# Patient Record
Sex: Female | Born: 1940
Health system: Southern US, Community
[De-identification: ages and names within clinical notes are randomized; demographics above are authoritative.]

## PROBLEM LIST (undated history)

## (undated) DIAGNOSIS — Z950 Presence of cardiac pacemaker: Secondary | ICD-10-CM

## (undated) DIAGNOSIS — G4733 Obstructive sleep apnea (adult) (pediatric): Secondary | ICD-10-CM

## (undated) DIAGNOSIS — M47812 Spondylosis without myelopathy or radiculopathy, cervical region: Secondary | ICD-10-CM

## (undated) DIAGNOSIS — G4710 Hypersomnia, unspecified: Secondary | ICD-10-CM

## (undated) DIAGNOSIS — N318 Other neuromuscular dysfunction of bladder: Secondary | ICD-10-CM

## (undated) DIAGNOSIS — E039 Hypothyroidism, unspecified: Secondary | ICD-10-CM

## (undated) DIAGNOSIS — F329 Major depressive disorder, single episode, unspecified: Secondary | ICD-10-CM

## (undated) DIAGNOSIS — E7800 Pure hypercholesterolemia, unspecified: Secondary | ICD-10-CM

## (undated) DIAGNOSIS — M1990 Unspecified osteoarthritis, unspecified site: Secondary | ICD-10-CM

## (undated) DIAGNOSIS — I10 Essential (primary) hypertension: Secondary | ICD-10-CM

## (undated) DIAGNOSIS — F32A Depression, unspecified: Secondary | ICD-10-CM

## (undated) DIAGNOSIS — R51 Headache: Secondary | ICD-10-CM

## (undated) DIAGNOSIS — M519 Unspecified thoracic, thoracolumbar and lumbosacral intervertebral disc disorder: Secondary | ICD-10-CM

## (undated) DIAGNOSIS — I495 Sick sinus syndrome: Secondary | ICD-10-CM

## (undated) DIAGNOSIS — K648 Other hemorrhoids: Secondary | ICD-10-CM

## (undated) DIAGNOSIS — K219 Gastro-esophageal reflux disease without esophagitis: Secondary | ICD-10-CM

## (undated) HISTORY — DX: Headache: R51

## (undated) HISTORY — PX: BIOPSY BREAST: PRO8

## (undated) HISTORY — PX: CATARACT EXTRACTION W/ INTRAOCULAR LENS IMPLANT: SHX1309

## (undated) HISTORY — DX: Pure hypercholesterolemia, unspecified: E78.00

## (undated) HISTORY — DX: Unspecified osteoarthritis, unspecified site: M19.90

## (undated) HISTORY — PX: TUBAL LIGATION: SHX77

## (undated) HISTORY — DX: Hypersomnia, unspecified: G47.10

## (undated) HISTORY — DX: Other neuromuscular dysfunction of bladder: N31.8

## (undated) HISTORY — DX: Major depressive disorder, single episode, unspecified: F32.9

## (undated) HISTORY — DX: Depression, unspecified: F32.A

## (undated) HISTORY — DX: Other hemorrhoids: K64.8

## (undated) HISTORY — PX: COLONOSCOPY: SHX174

## (undated) HISTORY — DX: Essential (primary) hypertension: I10

## (undated) HISTORY — DX: Obstructive sleep apnea (adult) (pediatric): G47.33

## (undated) HISTORY — DX: Hypothyroidism, unspecified: E03.9

## (undated) HISTORY — DX: Unspecified thoracic, thoracolumbar and lumbosacral intervertebral disc disorder: M51.9

## (undated) HISTORY — DX: Spondylosis without myelopathy or radiculopathy, cervical region: M47.812

## (undated) HISTORY — PX: KNEE ARTHROSCOPY: SUR90

## (undated) HISTORY — PX: OTHER SURGICAL HISTORY: SHX169

---

## 1998-06-15 ENCOUNTER — Other Ambulatory Visit: Admission: RE | Admit: 1998-06-15 | Discharge: 1998-06-15 | Payer: Self-pay | Admitting: Obstetrics and Gynecology

## 1998-12-30 ENCOUNTER — Other Ambulatory Visit: Admission: RE | Admit: 1998-12-30 | Discharge: 1998-12-30 | Payer: Self-pay | Admitting: *Deleted

## 1999-02-08 ENCOUNTER — Other Ambulatory Visit: Admission: RE | Admit: 1999-02-08 | Discharge: 1999-02-08 | Payer: Self-pay | Admitting: Obstetrics and Gynecology

## 2001-02-27 ENCOUNTER — Other Ambulatory Visit: Admission: RE | Admit: 2001-02-27 | Discharge: 2001-02-27 | Payer: Self-pay | Admitting: Obstetrics and Gynecology

## 2001-07-04 ENCOUNTER — Ambulatory Visit (HOSPITAL_COMMUNITY): Admission: RE | Admit: 2001-07-04 | Discharge: 2001-07-04 | Payer: Self-pay | Admitting: Gastroenterology

## 2004-11-08 ENCOUNTER — Ambulatory Visit: Payer: Self-pay | Admitting: Pulmonary Disease

## 2004-12-27 ENCOUNTER — Ambulatory Visit: Payer: Self-pay | Admitting: Pulmonary Disease

## 2005-02-24 ENCOUNTER — Ambulatory Visit: Payer: Self-pay | Admitting: Pulmonary Disease

## 2005-04-01 ENCOUNTER — Ambulatory Visit: Payer: Self-pay | Admitting: Pulmonary Disease

## 2005-11-23 ENCOUNTER — Ambulatory Visit: Payer: Self-pay | Admitting: Pulmonary Disease

## 2005-12-28 ENCOUNTER — Ambulatory Visit: Payer: Self-pay | Admitting: Pulmonary Disease

## 2006-01-23 ENCOUNTER — Encounter: Admission: RE | Admit: 2006-01-23 | Discharge: 2006-01-23 | Payer: Self-pay | Admitting: Obstetrics and Gynecology

## 2006-02-28 ENCOUNTER — Ambulatory Visit: Payer: Self-pay | Admitting: Pulmonary Disease

## 2006-08-04 ENCOUNTER — Ambulatory Visit: Payer: Self-pay | Admitting: Pulmonary Disease

## 2007-01-09 ENCOUNTER — Encounter: Payer: Self-pay | Admitting: Internal Medicine

## 2007-01-29 ENCOUNTER — Encounter: Admission: RE | Admit: 2007-01-29 | Discharge: 2007-01-29 | Payer: Self-pay | Admitting: Obstetrics and Gynecology

## 2007-02-02 DIAGNOSIS — E039 Hypothyroidism, unspecified: Secondary | ICD-10-CM

## 2007-02-02 DIAGNOSIS — M1990 Unspecified osteoarthritis, unspecified site: Secondary | ICD-10-CM | POA: Insufficient documentation

## 2007-02-02 DIAGNOSIS — I10 Essential (primary) hypertension: Secondary | ICD-10-CM

## 2007-02-05 ENCOUNTER — Encounter: Payer: Self-pay | Admitting: Adult Health

## 2007-02-05 ENCOUNTER — Ambulatory Visit: Payer: Self-pay | Admitting: Pulmonary Disease

## 2007-02-05 DIAGNOSIS — E785 Hyperlipidemia, unspecified: Secondary | ICD-10-CM

## 2007-02-05 DIAGNOSIS — F418 Other specified anxiety disorders: Secondary | ICD-10-CM

## 2007-02-05 DIAGNOSIS — J309 Allergic rhinitis, unspecified: Secondary | ICD-10-CM | POA: Insufficient documentation

## 2007-02-05 DIAGNOSIS — R51 Headache: Secondary | ICD-10-CM

## 2007-02-05 DIAGNOSIS — M47812 Spondylosis without myelopathy or radiculopathy, cervical region: Secondary | ICD-10-CM

## 2007-02-05 DIAGNOSIS — R519 Headache, unspecified: Secondary | ICD-10-CM | POA: Insufficient documentation

## 2007-02-05 LAB — CONVERTED CEMR LAB
AST: 22 units/L (ref 0–37)
BUN: 20 mg/dL (ref 6–23)
Basophils Relative: 0.5 % (ref 0.0–1.0)
Bilirubin, Direct: 0.1 mg/dL (ref 0.0–0.3)
Cholesterol: 189 mg/dL (ref 0–200)
Eosinophils Relative: 3.1 % (ref 0.0–5.0)
GFR calc non Af Amer: 90 mL/min
Glucose, Bld: 89 mg/dL (ref 70–99)
HCT: 41.2 % (ref 36.0–46.0)
MCHC: 32.4 g/dL (ref 30.0–36.0)
MCV: 90.4 fL (ref 78.0–100.0)
Monocytes Absolute: 0.4 10*3/uL (ref 0.2–0.7)
Monocytes Relative: 7.4 % (ref 3.0–11.0)
Neutrophils Relative %: 52.1 % (ref 43.0–77.0)
Platelets: 282 10*3/uL (ref 150–400)
RBC: 4.56 M/uL (ref 3.87–5.11)
RDW: 12.5 % (ref 11.5–14.6)
TSH: 1.47 microintl units/mL (ref 0.35–5.50)
Triglycerides: 60 mg/dL (ref 0–149)
WBC: 5.4 10*3/uL (ref 4.5–10.5)

## 2007-02-14 ENCOUNTER — Telehealth: Payer: Self-pay | Admitting: Pulmonary Disease

## 2007-03-06 ENCOUNTER — Telehealth: Payer: Self-pay | Admitting: Pulmonary Disease

## 2007-03-13 ENCOUNTER — Telehealth (INDEPENDENT_AMBULATORY_CARE_PROVIDER_SITE_OTHER): Payer: Self-pay | Admitting: *Deleted

## 2007-04-02 ENCOUNTER — Telehealth: Payer: Self-pay | Admitting: Pulmonary Disease

## 2007-04-09 ENCOUNTER — Telehealth (INDEPENDENT_AMBULATORY_CARE_PROVIDER_SITE_OTHER): Payer: Self-pay | Admitting: *Deleted

## 2007-04-11 ENCOUNTER — Ambulatory Visit: Payer: Self-pay | Admitting: Pulmonary Disease

## 2007-04-29 ENCOUNTER — Ambulatory Visit (HOSPITAL_BASED_OUTPATIENT_CLINIC_OR_DEPARTMENT_OTHER): Admission: RE | Admit: 2007-04-29 | Discharge: 2007-04-29 | Payer: Self-pay | Admitting: Pulmonary Disease

## 2007-04-29 ENCOUNTER — Encounter: Payer: Self-pay | Admitting: Internal Medicine

## 2007-05-05 ENCOUNTER — Ambulatory Visit: Payer: Self-pay | Admitting: Internal Medicine

## 2007-05-18 ENCOUNTER — Ambulatory Visit: Payer: Self-pay | Admitting: Internal Medicine

## 2007-05-23 ENCOUNTER — Encounter: Payer: Self-pay | Admitting: Internal Medicine

## 2007-05-31 ENCOUNTER — Encounter: Payer: Self-pay | Admitting: Internal Medicine

## 2007-05-31 DIAGNOSIS — G4733 Obstructive sleep apnea (adult) (pediatric): Secondary | ICD-10-CM

## 2007-06-06 ENCOUNTER — Encounter: Payer: Self-pay | Admitting: Internal Medicine

## 2007-06-14 ENCOUNTER — Telehealth (INDEPENDENT_AMBULATORY_CARE_PROVIDER_SITE_OTHER): Payer: Self-pay | Admitting: *Deleted

## 2007-06-15 ENCOUNTER — Ambulatory Visit: Payer: Self-pay | Admitting: Internal Medicine

## 2007-06-15 DIAGNOSIS — G4710 Hypersomnia, unspecified: Secondary | ICD-10-CM | POA: Insufficient documentation

## 2007-06-18 ENCOUNTER — Encounter: Payer: Self-pay | Admitting: Internal Medicine

## 2007-06-27 ENCOUNTER — Encounter: Payer: Self-pay | Admitting: Internal Medicine

## 2007-06-29 ENCOUNTER — Encounter: Payer: Self-pay | Admitting: Pulmonary Disease

## 2007-07-11 ENCOUNTER — Encounter: Payer: Self-pay | Admitting: Internal Medicine

## 2007-07-23 ENCOUNTER — Telehealth (INDEPENDENT_AMBULATORY_CARE_PROVIDER_SITE_OTHER): Payer: Self-pay | Admitting: *Deleted

## 2007-08-03 ENCOUNTER — Ambulatory Visit: Payer: Self-pay | Admitting: Internal Medicine

## 2007-08-06 ENCOUNTER — Ambulatory Visit: Payer: Self-pay | Admitting: Pulmonary Disease

## 2007-08-10 ENCOUNTER — Telehealth (INDEPENDENT_AMBULATORY_CARE_PROVIDER_SITE_OTHER): Payer: Self-pay | Admitting: *Deleted

## 2007-08-20 LAB — CONVERTED CEMR LAB
Basophils Absolute: 0.1 10*3/uL (ref 0.0–0.1)
Eosinophils Relative: 3.1 % (ref 0.0–5.0)
HCT: 37.2 % (ref 36.0–46.0)
Hemoglobin: 12.6 g/dL (ref 12.0–15.0)
Lymphocytes Relative: 43.9 % (ref 12.0–46.0)
Monocytes Absolute: 0.5 10*3/uL (ref 0.1–1.0)
Monocytes Relative: 7.7 % (ref 3.0–12.0)
Platelets: 296 10*3/uL (ref 150–400)
RDW: 12.2 % (ref 11.5–14.6)
Vitamin B-12: 879 pg/mL (ref 211–911)

## 2008-01-07 ENCOUNTER — Telehealth (INDEPENDENT_AMBULATORY_CARE_PROVIDER_SITE_OTHER): Payer: Self-pay | Admitting: *Deleted

## 2008-01-30 ENCOUNTER — Encounter: Admission: RE | Admit: 2008-01-30 | Discharge: 2008-01-30 | Payer: Self-pay | Admitting: Obstetrics and Gynecology

## 2008-02-25 ENCOUNTER — Ambulatory Visit: Payer: Self-pay | Admitting: Internal Medicine

## 2008-02-25 ENCOUNTER — Encounter: Payer: Self-pay | Admitting: Adult Health

## 2008-02-25 LAB — CONVERTED CEMR LAB
AST: 30 units/L (ref 0–37)
Basophils Relative: 0.8 % (ref 0.0–3.0)
Bilirubin, Direct: 0.2 mg/dL (ref 0.0–0.3)
CO2: 30 meq/L (ref 19–32)
CRP, High Sensitivity: 2 (ref 0.00–5.00)
Calcium: 9.5 mg/dL (ref 8.4–10.5)
Chloride: 104 meq/L (ref 96–112)
Creatinine, Ser: 0.6 mg/dL (ref 0.4–1.2)
Eosinophils Absolute: 0.2 10*3/uL (ref 0.0–0.7)
GFR calc Af Amer: 129 mL/min
GFR calc non Af Amer: 107 mL/min
Glucose, Bld: 95 mg/dL (ref 70–99)
HCT: 38.7 % (ref 36.0–46.0)
Hemoglobin: 13.3 g/dL (ref 12.0–15.0)
Ketones, ur: NEGATIVE mg/dL
Lymphocytes Relative: 30.2 % (ref 12.0–46.0)
Neutrophils Relative %: 59.2 % (ref 43.0–77.0)
Platelets: 269 10*3/uL (ref 150–400)
Specific Gravity, Urine: 1.015 (ref 1.000–1.03)
Total Bilirubin: 0.8 mg/dL (ref 0.3–1.2)
Total CHOL/HDL Ratio: 4.4
Total Protein, Urine: NEGATIVE mg/dL
Total Protein: 7.7 g/dL (ref 6.0–8.3)
Triglycerides: 57 mg/dL (ref 0–149)
Urobilinogen, UA: 0.2 (ref 0.0–1.0)
VLDL: 11 mg/dL (ref 0–40)
pH: 6 (ref 5.0–8.0)

## 2008-02-27 LAB — CONVERTED CEMR LAB
AST: 30 units/L (ref 0–37)
Albumin: 4.4 g/dL (ref 3.5–5.2)
Alkaline Phosphatase: 59 units/L (ref 39–117)
Bilirubin Urine: NEGATIVE
Bilirubin, Direct: 0.2 mg/dL (ref 0.0–0.3)
Chloride: 104 meq/L (ref 96–112)
Eosinophils Relative: 3.3 % (ref 0.0–5.0)
GFR calc Af Amer: 129 mL/min
GFR calc non Af Amer: 107 mL/min
HDL: 39.2 mg/dL (ref >39.0)
Hemoglobin: 13.3 g/dL (ref 12.0–15.0)
MCV: 88.4 fL (ref 78.0–100.0)
Monocytes Absolute: 0.4 10*3/uL (ref 0.1–1.0)
Monocytes Relative: 6.5 % (ref 3.0–12.0)
Neutrophils Relative %: 59.2 % (ref 43.0–77.0)
Potassium: 3.7 meq/L (ref 3.5–5.1)
Sodium: 142 meq/L (ref 135–145)
Specific Gravity, Urine: 1.015 (ref 1.000–1.03)
TSH: 1.48 microintl units/mL (ref 0.35–5.50)
Total Bilirubin: 0.8 mg/dL (ref 0.3–1.2)
Total CHOL/HDL Ratio: 4.4
Triglycerides: 57 mg/dL (ref 0–149)
Urobilinogen, UA: 0.2 (ref 0.0–1.0)
VLDL: 11 mg/dL (ref 0–40)
WBC: 5.4 10*3/uL (ref 4.5–10.5)

## 2008-03-05 ENCOUNTER — Telehealth (INDEPENDENT_AMBULATORY_CARE_PROVIDER_SITE_OTHER): Payer: Self-pay | Admitting: *Deleted

## 2008-03-10 ENCOUNTER — Encounter: Payer: Self-pay | Admitting: Internal Medicine

## 2008-04-02 ENCOUNTER — Encounter: Payer: Self-pay | Admitting: Internal Medicine

## 2008-04-18 ENCOUNTER — Encounter: Payer: Self-pay | Admitting: Adult Health

## 2008-06-27 ENCOUNTER — Telehealth: Payer: Self-pay | Admitting: Pulmonary Disease

## 2008-06-27 ENCOUNTER — Encounter: Payer: Self-pay | Admitting: Pulmonary Disease

## 2008-06-27 ENCOUNTER — Encounter: Admission: RE | Admit: 2008-06-27 | Discharge: 2008-06-27 | Payer: Self-pay | Admitting: Pulmonary Disease

## 2008-11-10 ENCOUNTER — Ambulatory Visit: Payer: Self-pay | Admitting: Pulmonary Disease

## 2008-11-10 ENCOUNTER — Telehealth: Payer: Self-pay | Admitting: Pulmonary Disease

## 2008-11-10 DIAGNOSIS — K648 Other hemorrhoids: Secondary | ICD-10-CM

## 2008-11-10 HISTORY — DX: Other hemorrhoids: K64.8

## 2008-11-10 LAB — CONVERTED CEMR LAB
Bilirubin Urine: NEGATIVE
Hemoglobin, Urine: NEGATIVE
Leukocytes, UA: NEGATIVE
Total Protein, Urine: NEGATIVE mg/dL
Urine Glucose: NEGATIVE mg/dL
Urobilinogen, UA: 0.2 (ref 0.0–1.0)
pH: 6.5 (ref 5.0–8.0)

## 2008-11-11 ENCOUNTER — Encounter: Payer: Self-pay | Admitting: Pulmonary Disease

## 2008-11-13 ENCOUNTER — Encounter: Payer: Self-pay | Admitting: Pulmonary Disease

## 2008-11-22 ENCOUNTER — Encounter: Admission: RE | Admit: 2008-11-22 | Discharge: 2008-11-22 | Payer: Self-pay | Admitting: Orthopedic Surgery

## 2008-12-17 ENCOUNTER — Ambulatory Visit: Payer: Self-pay | Admitting: Pulmonary Disease

## 2008-12-17 ENCOUNTER — Telehealth: Payer: Self-pay | Admitting: Pulmonary Disease

## 2008-12-17 DIAGNOSIS — K649 Unspecified hemorrhoids: Secondary | ICD-10-CM | POA: Insufficient documentation

## 2008-12-17 DIAGNOSIS — N318 Other neuromuscular dysfunction of bladder: Secondary | ICD-10-CM

## 2009-01-10 HISTORY — PX: BREAST EXCISIONAL BIOPSY: SUR124

## 2009-02-13 ENCOUNTER — Telehealth: Payer: Self-pay | Admitting: Pulmonary Disease

## 2009-02-24 ENCOUNTER — Encounter: Admission: RE | Admit: 2009-02-24 | Discharge: 2009-02-24 | Payer: Self-pay | Admitting: Obstetrics and Gynecology

## 2009-03-16 ENCOUNTER — Telehealth: Payer: Self-pay | Admitting: Pulmonary Disease

## 2009-03-18 ENCOUNTER — Encounter: Payer: Self-pay | Admitting: Pulmonary Disease

## 2009-03-20 ENCOUNTER — Telehealth (INDEPENDENT_AMBULATORY_CARE_PROVIDER_SITE_OTHER): Payer: Self-pay | Admitting: *Deleted

## 2009-04-02 ENCOUNTER — Ambulatory Visit (HOSPITAL_BASED_OUTPATIENT_CLINIC_OR_DEPARTMENT_OTHER): Admission: RE | Admit: 2009-04-02 | Discharge: 2009-04-02 | Payer: Self-pay | Admitting: Surgery

## 2009-04-02 ENCOUNTER — Encounter: Admission: RE | Admit: 2009-04-02 | Discharge: 2009-04-02 | Payer: Self-pay | Admitting: Surgery

## 2009-04-22 ENCOUNTER — Encounter: Payer: Self-pay | Admitting: Pulmonary Disease

## 2009-07-28 ENCOUNTER — Encounter: Payer: Self-pay | Admitting: Adult Health

## 2009-07-28 ENCOUNTER — Ambulatory Visit: Payer: Self-pay | Admitting: Pulmonary Disease

## 2009-07-28 LAB — CONVERTED CEMR LAB: Rhuematoid fact SerPl-aCnc: <20 intl units/mL (ref 0–20)

## 2009-07-31 LAB — CONVERTED CEMR LAB
ALT: 17 units/L (ref 0–35)
Albumin: 4.8 g/dL (ref 3.5–5.2)
Alkaline Phosphatase: 47 units/L (ref 39–117)
Basophils Absolute: 0 10*3/uL (ref 0.0–0.1)
Basophils Relative: 0.7 % (ref 0.0–3.0)
Bilirubin Urine: NEGATIVE
Bilirubin, Direct: 0.1 mg/dL (ref 0.0–0.3)
CO2: 32 meq/L (ref 19–32)
Chloride: 105 meq/L (ref 96–112)
Eosinophils Absolute: 0.1 10*3/uL (ref 0.0–0.7)
Eosinophils Relative: 2.2 % (ref 0.0–5.0)
Glucose, Bld: 80 mg/dL (ref 70–99)
Hemoglobin, Urine: NEGATIVE
Hemoglobin: 13.5 g/dL (ref 12.0–15.0)
MCHC: 34.3 g/dL (ref 30.0–36.0)
Monocytes Absolute: 0.3 10*3/uL (ref 0.1–1.0)
Monocytes Relative: 7 % (ref 3.0–12.0)
Neutrophils Relative %: 60.2 % (ref 43.0–77.0)
Nitrite: NEGATIVE
RBC: 4.4 M/uL (ref 3.87–5.11)
RDW: 13.3 % (ref 11.5–14.6)
Sodium: 141 meq/L (ref 135–145)
Specific Gravity, Urine: <=1.005 (ref 1.000–1.030)
Total Protein: 7.7 g/dL (ref 6.0–8.3)
Triglycerides: 119 mg/dL (ref 0.0–149.0)
Urobilinogen, UA: 0.2 (ref 0.0–1.0)
pH: 6.5 (ref 5.0–8.0)

## 2009-10-02 ENCOUNTER — Telehealth (INDEPENDENT_AMBULATORY_CARE_PROVIDER_SITE_OTHER): Payer: Self-pay | Admitting: *Deleted

## 2009-10-09 ENCOUNTER — Encounter: Admission: RE | Admit: 2009-10-09 | Discharge: 2009-10-09 | Payer: Self-pay | Admitting: Orthopedic Surgery

## 2009-12-31 ENCOUNTER — Ambulatory Visit: Payer: Self-pay | Admitting: Pulmonary Disease

## 2009-12-31 DIAGNOSIS — R5381 Other malaise: Secondary | ICD-10-CM | POA: Insufficient documentation

## 2009-12-31 DIAGNOSIS — R5383 Other fatigue: Secondary | ICD-10-CM

## 2010-01-10 LAB — CONVERTED CEMR LAB: Vitamin B-12: 791 pg/mL (ref 211–911)

## 2010-01-30 ENCOUNTER — Encounter: Payer: Self-pay | Admitting: Orthopedic Surgery

## 2010-01-31 ENCOUNTER — Encounter: Payer: Self-pay | Admitting: Obstetrics and Gynecology

## 2010-02-09 NOTE — Letter (Signed)
Summary: [facility]   Imported By: Sherian Rein 04/08/2009 13:34:29  _____________________________________________________________________  External Attachment:    Type:   Image     Comment:   External Document

## 2010-02-09 NOTE — Letter (Signed)
Summary: [facility]   Imported By: Sherian Rein 05/05/2009 08:54:59  _____________________________________________________________________  External Attachment:    Type:   Image     Comment:   External Document

## 2010-02-09 NOTE — Progress Notes (Signed)
Summary: cough med called in  Phone Note Call from Patient Call back at Work Phone [phone]   Caller: Patient Call For: NADEL Summary of Call: needs  something for cough. donmt want to come in for an apt.  Initial call taken by: Valinda Hoar,  March 20, 2009 12:54 PM  Follow-up for Phone Call        cough started yesterday----unable to get anything up---but it is worse at night---giving her a very bad headache---has tried robitussion and is taking mucinex without any releif.  please adivse--  allergies---sulfa Randell Loop CMA  March 20, 2009 1:32 PM   Additional Follow-up for Phone Call Additional follow up Details #1::        per SN---ok for pt to have tussionex  #4oz  1 tsp by mouth every 12 hours as needed with 2 refills.  this has been called to the pharmacy and the pt is aware Randell Loop CMA  March 20, 2009 2:00 PM     New/Updated Medications: Sandria Senter ER 8-10 MG/5ML LQCR (CHLORPHENIRAMINE-HYDROCODONE) 1 tsp by mouth every 12 hours as needed for cough Prescriptions: TUSSIONEX PENNKINETIC ER 8-10 MG/5ML LQCR (CHLORPHENIRAMINE-HYDROCODONE) 1 tsp by mouth every 12 hours as needed for cough  #4 oz x 2   Entered by:   Randell Loop CMA   Authorized by:   Michele Mcalpine MD   Signed by:   Randell Loop CMA on 03/20/2009   Method used:   Telephoned to ...       [id number] (retail)       [address]       [place], [place]  [address]       Ph: [phone] or [phone]       Fax: [phone]   RxID:   [id number]

## 2010-02-09 NOTE — Progress Notes (Signed)
Summary: rx request  Phone Note Call from Patient Call back at Work Phone [phone]   Caller: Patient Call For: nadel Summary of Call: pt requests new rx (mail to pt) for DIOVAN. pt will send it to mail order pharm.  Initial call taken by: Tivis Ringer, CNA,  March 16, 2009 12:57 PM  Follow-up for Phone Call        The patient is requesting RX for her Diovan. She needs this mailed to her so she may send it to [facility]. I have verified her home address. She needs a 90 day supply. Follow-up by: Michel Bickers CMA,  March 16, 2009 2:18 PM  Additional Follow-up for Phone Call Additional follow up Details #1::        rx has been signed by SN and placed in the mail to the pt per her request. Randell Loop CMA  March 16, 2009 2:41 PM     Prescriptions: DIOVAN 160 MG  TABS (VALSARTAN) Take 1 tablet by mouth once a day  #90 x 3   Entered by:   Michel Bickers CMA   Authorized by:   Michele Mcalpine MD   Signed by:   Michel Bickers CMA on 03/16/2009   Method used:   Print then Mail to Patient   RxID:   [id number]

## 2010-02-09 NOTE — Progress Notes (Signed)
Summary: waiting on refill > amlodipine sent to pharmacy  Phone Note Call from Patient   Caller: Patient Call For: tammy parrett Summary of Call: [facility] [address] that they have req refill for amlodipine and are waiting for a response. note: i made a f/u appt w/ sn for 12/31/09. (pt didn't realized she needed this since she just had a cpx w/ tp in july. pt # [id number] Initial call taken by: Tivis Ringer, CNA,  October 02, 2009 8:58 AM  Follow-up for Phone Call        this is my fault, i did not click "save refills to chart."  called spoke with patient and apologized.  refills sent to pt's verified pharmacy.  pt is aware. Follow-up by: Boone Master CNA/MA,  October 02, 2009 9:29 AM    Prescriptions: AMLODIPINE BESYLATE 10 MG  TABS (AMLODIPINE BESYLATE) take 1 tab by mouth once daily...  #30 x 2   Entered by:   Boone Master CNA/MA   Authorized by:   Rubye Oaks NP   Signed by:   Boone Master CNA/MA on 10/02/2009   Method used:   Electronically to        [facility] [id number] (retail)       [address]       [place], [place]  [address]       Ph: [phone]       Fax: [phone]   RxID:   [id number]

## 2010-02-09 NOTE — Assessment & Plan Note (Signed)
Summary: cpx/ mbw   Copy to:  [name] Primary Provider/Referring Provider:  [name]  CC:  cpx--fasting today--mom is on hospice and she is dealing with this--has some issues to discuss with TP today.  History of Present Illness: 70 y/o WF with known hx of HTN, OSA, AR.     ~  December 17, 2008:  she returns due to new bladder symptoms w/ "gotta go" urges and freq small amts w/ "pressure" and "a funny feeling"; but she denies burning or pain... UA & C&S last month were normal... GYN is DrDickstein w/ PAP Qoyr now & no prev bladder prob identified... She has had a busy yr- stress continues w/ 4 y/o mother in [place] now, on Hospice for FTT; and husb had MI 7/10 w/ more stents placed... she just had colon DrMedoff- neg x hems... SleepStudy last yr w/ OSA & hypersomnia- on CPAP 9 and Nuvigil per DrYoung...  July 28, 2009 --Presents for routine follow up/physical. Has been having more difficulties in left hip/buttock. Now w/ aching into upper leg and knee pain that is limiting her activity and ability to exercise. She is seeing Dr. Despina Hick in couple of weeks. Under alot of stress. Mother is in hospice /FTT. Brother is in prison. She had a benign breast bx last month. Says she had  EKG for preop , declines today.  Denies chest pain, dyspnea, orthopnea, hemoptysis, fever, n/v/d, edema, headache,recent travel or antibiotics.  Does not wear CPAP, complains that she does not sleep well we discussed importance of follow up w/ Dr. Maple Hudson and dangers of OSA. She has agreed to see him back in office.   Preventive Screening-Counseling & Management  Alcohol-Tobacco     Smoking Status: quit     Year Quit: 1970     Pack years: 23yrs, 1/2 ppd     Passive Smoke Exposure: no  Caffeine-Diet-Exercise     Does Patient Exercise: yes     Times/week: 3  Allergies: 1)  ! Sulfa 2)  ! Altace  Comments:  Nurse/Medical Assistant: The patient's medications and allergies were reviewed with the patient and were  updated in the Medication and Allergy Lists.  Past History:  Past Medical History: Last updated: 12/17/2008 HYPERSOMNIA (ICD-780.54) OBSTRUCTIVE SLEEP APNEA (ICD-327.23) ALLERGIC RHINITIS (ICD-477.9) HYPERTENSION (ICD-401.9) HYPERCHOLESTEROLEMIA (ICD-272.0) HYPOTHYROIDISM (ICD-244.9) HEMORRHOIDS (ICD-455.6) DEGENERATIVE JOINT DISEASE (ICD-715.90) SPONDYLOSIS, CERVICAL (ICD-721.0) Hx of HEADACHE, MIXED (ICD-784.0) Hx of DEPRESSION, SITUATIONAL (ICD-309.0)  Past Surgical History: Last updated: 12/17/2008 S/P R Rotator Cuff Surgery by DrSypher S/P R Knee Arthroscopy by DrAlusio tubal ligation  Review of Systems      See HPI  Vital Signs:  Patient profile:   70 year old female Height:      60 inches Weight:      154.25 pounds O2 Sat:      96 % on Room air Temp:     97.3 degrees F oral Pulse rate:   51 / minute BP sitting:   108 / 70  (left arm) Cuff size:   regular  Vitals Entered By: Randell Loop CMA (July 28, 2009 9:29 AM)  O2 Sat at Rest %:  96 O2 Flow:  Room air CC: cpx--fasting today--mom is on hospice and she is dealing with this--has some issues to discuss with TP today Is Patient Diabetic? No Pain Assessment Patient in pain? no      Comments meds and allergies reviewed with pt today daytime phonenumber is [phone] Randell Loop CMA  July 28, 2009 9:46 AM  Physical Exam  Additional Exam:  WD, Overweight, 70 y/o WF in [place]... GENERAL:  Alert & oriented; pleasant & cooperative... HEENT:  [place]/AT, EOM-wnl, PERRLA, EACs-clear, TMs-wnl, NOSE-clear, THROAT-clear & wnl. NECK:  Supple w/ fairROM; no JVD; normal carotid impulses w/o bruits; no thyromegaly or nodules palpated; no lymphadenopathy. CHEST:  Clear to P & A; without wheezes/ rales/ or rhonchi... no trigger points. HEART:  Regular Rhythm; without murmurs/ rubs/ or gallops. ABDOMEN:  Soft & nontender; normal bowel sounds; no organomegaly or masses detected. EXT: without deformities, mild arthritic  changes; no varicose veins/ venous insuffic/ or edema. NEURO:  CN's intact; motor testing normal; sensory testing normal; gait normal & balance OK. DERM:  No lesions noted; no rash etc...    Impression & Recommendations:  Problem # 1:  OBSTRUCTIVE SLEEP APNEA (ICD-327.23) follow up Dr. Maple Hudson in next few weeks to discuss sleep apnea, hyypersomnia.   Problem # 2:  HYPERTENSION (ICD-401.9) controlled on rx  labs pending.  Her updated medication list for this problem includes:    Diovan 160 Mg Tabs (Valsartan) .[name]... Take 1 tablet by mouth once a day    Amlodipine Besylate 10 Mg Tabs (Amlodipine besylate) .[name]... Take 1 tab by mouth once daily...  BP today: 108/70 Prior BP: 122/74 (12/17/2008)  Labs Reviewed: K+: 3.7 (02/25/2008) Creat: : 0.6 (02/25/2008)   Chol: 173 (02/25/2008)   HDL: 39.2 (02/25/2008)   LDL: 122 (02/25/2008)   TG: 57 (02/25/2008)  Problem # 3:  PHYSICAL EXAMINATION (ICD-V70.0) Td, Pnumovax, shingles utd colon 2010 mammo/pap per dr Rosalio Macadamia.  cont on calcium and vit d  labs pending.  Orders: TLB-BMP (Basic Metabolic Panel-BMET) (80048-METABOL) TLB-Hepatic/Liver Function Pnl (80076-HEPATIC) TLB-CBC Platelet - w/Differential (85025-CBCD) TLB-TSH (Thyroid Stimulating Hormone) (84443-TSH) TLB-Lipid Panel (80061-LIPID) TLB-Sedimentation Rate (ESR) (85652-ESR) T-Vitamin D (25-Hydroxy) (63335-45625) TLB-Udip ONLY (81003-UDIP) Est. Patient 65& > (63893)  Problem # 4:  HYPERCHOLESTEROLEMIA (ICD-272.0) diet and exercise   Problem # 5:  DEGENERATIVE JOINT DISEASE (ICD-715.90) follow up Dr. Berton Lan  Her pdated medication list for this problem includes:    Adult Aspirin Ec Low Strength 81 Mg Tbec (Aspirin) .[name]... 1 by mouth daily  Medications Added to Medication List This Visit: 1)  Zoloft 50 Mg Tabs (Sertraline hcl) .... Take 1 tablet by mouth once a day 2)  Fish Oil 1200 Mg Caps (Omega-3 fatty acids) .... Take one capsule by mouth two times a day 3)  Allegra 180  Mg Tabs (Fexofenadine hcl) .... Take 1 tablet by mouth once a day  Complete Medication List: 1)  Cpap 9 Cwp  .... At bedtime 2)  Nuvigil 150 Mg Tabs (Armodafinil) .[name].. 1 each morning as needed 3)  Claritin 10 Mg Tabs (Loratadine) .[name].. 1 tab daily as needed... 4)  Patanase 0.6 % Soln (Olopatadine hcl) .[name].. 1-2 sprays each nostril up to two times a day 5)  Mucinex 600 Mg Tb12 (Guaifenesin) .[name].. 1-2 tabs by mouth two times a day as needed 6)  Adult Aspirin Ec Low Strength 81 Mg Tbec (Aspirin) .[name].. 1 by mouth daily 7)  Diovan 160 Mg Tabs (Valsartan) .... Take 1 tablet by mouth once a day 8)  Amlodipine Besylate 10 Mg Tabs (Amlodipine besylate) .... Take 1 tab by mouth once daily.Marland KitchenMarland Kitchen 9)  Synthroid 125 Mcg Tabs (Levothyroxine sodium) .... Take 1/2  tablet by mouth once a day 10)  [facility] Calcium 600 + D 600-400 Mg-unit Tabs (Calcium carbonate-vitamin d) .... 2 by mouth daily 11)  Multivitamins Tabs (Multiple vitamin) .[name].. 1 by  mouth daily 12)  Vitamin D 1000 Unit Tabs (Cholecalciferol) .[name].. 1 by mouth daily 13)  Wellbutrin 100 Mg Tabs (Bupropion hcl) .... Take as directed 14)  Zoloft 50 Mg Tabs (Sertraline hcl) .... Take 1 tablet by mouth once a day 15)  Fish Oil 1200 Mg Caps (Omega-3 fatty acids) .... Take one capsule by mouth two times a day 16)  Allegra 180 Mg Tabs (Fexofenadine hcl) .... Take 1 tablet by mouth once a day  Patient Instructions: 1)  Set up Dr. Maple Hudson for sleep consult 2)  Continue on low fat cholestrol diet.  3)  Continue with exercise as tolerated.  4)  follow up Dr. Rosalio Macadamia as scheduled.  5)  follow up Dr. [name] in 6 months and as needed  Prescriptions: SYNTHROID 125 MCG  TABS (LEVOTHYROXINE SODIUM) Take 1/2  tablet by mouth once a day  #30 x 11   Entered and Authorized by:   Rubye Oaks NP   Signed by:   Rubye Oaks NP on 07/28/2009   Method used:   Electronically to        [id number] (retail)       [address]       [place], [place]   [address]       Ph: [phone]       Fax: [phone]   RxID:   [id number] AMLODIPINE BESYLATE 10 MG  TABS (AMLODIPINE BESYLATE) take 1 tab by mouth once daily...  #30 Tablet x 11   Entered and Authorized by:   Rubye Oaks NP   Signed by:   Rubye Oaks NP on 07/28/2009   Method used:   Electronically to        [id number] (retail)       [address]       [place], [place]  [address]       Ph: [phone]       Fax: [phone]   RxID:   [id number] DIOVAN 160 MG  TABS (VALSARTAN) Take 1 tablet by mouth once a day  #90 x 3   Entered and Authorized by:   Rubye Oaks NP   Signed by:   Tammy Parrett NP on 07/28/2009   Method used:   Print then Give to Patient   RxID:   [id number]

## 2010-02-09 NOTE — Progress Notes (Signed)
Summary: cold  Phone Note Call from Patient Call back at [phone]   Caller: Patient Call For: Destenee Guerry Summary of Call: need something for cold [facility] Initial call taken by: Rickard Patience,  February 13, 2009 8:34 AM  Follow-up for Phone Call        Pt c/o head congestion, pressure around her eyes, post nasal drip,  sore throat, and dry cough x 4 days. Pt has been using mucinex and OTC allergy med with no relief. Pt also c/o dryness and cracks on the corner of eyes. She is also asking for a cream for her eyes as well. Please advise. Carron Curie CMA  February 13, 2009 9:35 AM allergies: sulfa, altace  Additional Follow-up for Phone Call Additional follow up Details #1::        per SN----we dont have a cream for her eyes--use mucinex max 1200mg  by mouth two times a day with plenty of fluids, nasal saline spray every 1-2 hours while awake and ok to have zpak #1 take as directed and use otc cough drops.  thanks Randell Loop CMA  February 13, 2009 10:27 AM   pt advised of recs and rx sent.Carron Curie CMA  February 13, 2009 10:35 AM     New/Updated Medications: ZITHROMAX Z-PAK 250 MG TABS (AZITHROMYCIN) as directed Prescriptions: ZITHROMAX Z-PAK 250 MG TABS (AZITHROMYCIN) as directed  #1 apk x 0   Entered by:   Carron Curie CMA   Authorized by:   Michele Mcalpine MD   Signed by:   Carron Curie CMA on 02/13/2009   Method used:   Electronically to        [id number] ([facility]       [address]       [place], [place]  [address]       Ph: [phone] or [phone]       Fax: [phone]   RxID:   [id number]

## 2010-02-10 ENCOUNTER — Other Ambulatory Visit: Payer: Self-pay | Admitting: Orthopedic Surgery

## 2010-02-10 DIAGNOSIS — M549 Dorsalgia, unspecified: Secondary | ICD-10-CM

## 2010-02-10 DIAGNOSIS — M5136 Other intervertebral disc degeneration, lumbar region: Secondary | ICD-10-CM

## 2010-02-11 NOTE — Assessment & Plan Note (Signed)
Summary: f/u refills///kp   Primary Care Provider:  [name]  CC:  Yearly ROV & review of mult medical problems....  History of Present Illness: 71 y/o WF here for a follow up visit...    ~  Apr09:  her BP was well controlled prev on ATENOLOL 25mg /d and DIOVAN 160mg - 1/2 tab daily w/ BP's  ~120/80 range... she requested to stop the Atenolol due to fatigue and insomnia, and decided to incr the Diovan to 160mg /d... since then she notes that her fatigue is no better, and her BP checks at the [facility] where she [facility] have been 150-160/ 80-90 range (on the Diovan 160mg /d alone)... she is not really on any spec diet and has gained 5# since last OV...  She also notes that she is quite "overwhelmed" (husb out of work >37yr, sister in law died, 44 y/o mother in her own apt, etc...) and is seeing a Haematologist for this and depression... she requests to incr her EFFEXOR XR from 75 to 150mg /d... she also notes not resting well/ sleeps poorly/ snores but no obious apneas according to her husb... we will pursue this w/ a sleep study...   ~  December 17, 2008:  she returns due to new bladder symptoms w/ "gotta go" urges and freq small amts w/ "pressure" and "a funny feeling"; but she denies burning or pain... UA & C&S last month were normal... GYN is DrDickstein w/ PAP Qoyr now & no prev bladder prob identified... She has had a busy yr- stress continues w/ 82 y/o mother in [place] now, on Hospice for FTT; and husb had MI 7/10 w/ more stents placed... she just had colon DrMedoff- neg x hems... SleepStudy last yr w/ OSA & hypersomnia- on CPAP 9 and Nuvigil per DrYoung...   ~  December 31, 2009:  she states that she's had a "rough yr"> 96y/o mother released from hospice & more work for her; c/o arthritis in shoulders/ hips/ etc> eval by DrAlusio w/ rotator cuff prob & Rx w/ Ibuprofen "I do yoga" & she is encouraged to f/u w/ Ortho... SEE PROB LIST BELOW>>>   Current Problems:   HYPERSOMNIA (ICD-780.54) &  OBSTRUCTIVE SLEEP APNEA (ICD-327.23) - prev on CPAP 9cmH2O pressure and Nuvigil 150mg  Prn per DrYoung, but she stopped on her own in 2011 (states no better on the Rx)...  ~  Sleep Study 4/09 showed AHI= 1/hr, RDI= 21/hr worse supine, +snoring & desat to 85%...  ~  Sleep consult DrYoung w/ rec for CPAP- this controlled her OSA but the hypersomnia persisted & trial Nuvigil Rx...  ~  12/11:  pt reports that she stopped CPAP & Nuvigil on her own- citing no improvement on therapy.  ALLERGIC RHINITIS (ICD-477.9) - uses OTC Allegra, Mucinex, & PATANASE 1-2 sp Bid Prn...  HYPERTENSION (ICD-401.9) - on ASA 81mg /d, AMLODIPINE 10mg /d,  DIOVAN 160mg /d... BP today = 122/74 & similar at home... denies HA, fatigue, visual changes, CP, palipit, dizziness, syncope, dyspnea, edema, etc...   ~  CXR 6/10 showed borderline Cor, clear lungs, WNL...  HYPERCHOLESTEROLEMIA (ICD-272.0) - she has not been on diet or meds... she would like to start a statin drug because of what she heard on [facility]... FLP 12/07 showed TChol 184, TG 77, HDL 42, LDL 126... diet & ex recommended, she does exercise 5d/wk at gym w/ cardio and stretching, etc... wt up 2-3 # in last yr despite efforts...  ~  FLP 1/09 on diet showed TChol 189, TG 60, HDL 40, LDL 137.Marland KitchenMarland Kitchen  Simvastatin 20mg /d started.  ~  FLP 2/10 on Simva20 showed TChol 173, TG 57, HDL 39, LDL 122... same med, better diet, get wt down.  ~  Now she states that she never really took the Simva20, & just tkaes FISH OIL supplements.  ~  FLP 7/11 on diet alone showed TChol 181, TG 119, HDL 37, LDL 120  HYPOTHYROIDISM (ICD-244.9) - stable on SYNTHROID - 1/2 tab daily... last TSH 12/07 was 1.77-OK.  ~  labs 1/09 showed TSH = 1.47... continue same.  ~  labs 2/10 showed TSH= 1.48  ~  labs 7/11 showed TSH= 1.76  HEMATOCHEZIA (ICD-578.1) & HEMORRHOIDS (ICD-455.6) - she had one episode of BRB in stool/ bowel... mod amt, no pain, no recurrence per pt... she denies D/C etc... her  last colonoscopy was 6/03 by [facility] & was normal w/ f/u planned 23yrs... she will call [facility] for f/u.  ~  f/u colonoscopy 11/10 by [facility] showed hems, otherw neg...  DEGENERATIVE JOINT DISEASE (ICD-715.90) - she's had shoulder and knee surgery in past... uses OTC meds and had physical therapy rec by DrAlusio who also gave her a shot in R PSIS area for pain... she tells me she has rotator cuff prob & hip pain> both evaluated by DrAlusio & treated w/ OTC anti-inflamm  meds. 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 stress level is high (husb out of work x 81yr, MI 7/10)... she is no longer taking the Effexor, Wellbutrin, Zoloft, & now on CITALOPRAM 20mg /d per psychologist Youlanda Roys who works w/ Lindaann Slough for meds...   HEALTH MAINT:  she sees DrDickstein for GYN, [facility]... DrMedoff for GI...   ~  Immunizations:  she gets the yearly Flu vaccine each autumn... PNEUMOVAX & TETANUS 2/10... Shingles vaccine at [facility] 4/10...   Preventive Screening-Counseling & Management  Alcohol-Tobacco     Smoking Status: quit     Year Quit: 1970     Pack years: 75yrs, 1/2 ppd     Passive Smoke Exposure: no  Caffeine-Diet-Exercise     Does Patient Exercise: yes     Times/week: 3  Allergies: 1)  ! Sulfa 2)  ! Altace  Comments:  Nurse/Medical Assistant: The patient's medications and allergies were reviewed with the patient and were updated in the Medication and Allergy Lists.  Past History:  Past Medical History: HYPERSOMNIA (ICD-780.54) OBSTRUCTIVE SLEEP APNEA (ICD-327.23) ALLERGIC RHINITIS (ICD-477.9) HYPERTENSION (ICD-401.9) HYPERCHOLESTEROLEMIA (ICD-272.0) HYPOTHYROIDISM (ICD-244.9) HEMORRHOIDS (ICD-455.6) DEGENERATIVE JOINT DISEASE (ICD-715.90) SPONDYLOSIS, CERVICAL (ICD-721.0) Hx of HEADACHE, MIXED  (ICD-784.0) Hx of DEPRESSION, SITUATIONAL (ICD-309.0)  Past Surgical History: S/P R Rotator Cuff Surgery by DrSypher S/P R Knee Arthroscopy by DrAlusio tubal ligation  Family History: Reviewed history from 12/17/2008 and no changes required. Father died- Crohn's Mother alive age 66, lives at [facility]- on Hospice w/ FTT + hx of DJD, back prob... sibling 1-kidney disease sibling 2-allergies, arthritis  Social History: Reviewed history from 12/17/2008 and no changes required. Married  2 kids- 1 son w/ asthma, 2 grandkids Patient states former smoker. 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  The patient denies fever, chills, sweats, anorexia, fatigue, weakness, malaise, weight loss, sleep disorder, blurring, diplopia,  eye irritation, eye discharge, vision loss, eye pain, photophobia, earache, ear discharge, tinnitus, decreased hearing, nasal congestion, nosebleeds, sore throat, hoarseness, chest pain, palpitations, syncope, orthopnea, PND, peripheral edema, cough, dyspnea at rest, excessive sputum, hemoptysis, wheezing, pleurisy, nausea, vomiting, diarrhea, constipation, change in bowel habits, abdominal pain, melena, hematochezia, jaundice, gas/bloating, indigestion/heartburn, dysphagia, odynophagia, dysuria, hematuria, urinary frequency, urinary hesitancy, nocturia, incontinence, back pain, joint swelling, muscle cramps, muscle weakness, sciatica, restless legs, leg pain at night, leg pain with exertion, rash, itching, dryness, suspicious lesions, paralysis, paresthesias, seizures, tremors, vertigo, transient blindness, frequent falls, frequent headaches, difficulty walking, memory loss, confusion, cold intolerance, heat intolerance, polydipsia, polyphagia, polyuria, unusual weight change, abnormal  bruising, bleeding, enlarged lymph nodes, urticaria, allergic rash, hay fever, and recurrent infections.    Vital Signs:  Patient profile:   70 year old female Height:      60 inches Weight:      157.25 pounds BMI:     30.82 O2 Sat:      96 % on Room air Temp:     97.7 degrees F oral Pulse rate:   55 / minute BP sitting:   122 / 74  (right arm) Cuff size:   regular  Vitals Entered By: Randell Loop CMA (December 31, 2009 9:29 AM)  O2 Sat at Rest %:  96 O2 Flow:  Room air CC: Yearly ROV & review of mult medical problems... Is Patient Diabetic? No Pain Assessment Patient in pain? no      Comments meds updated today with pt   Physical Exam  Additional Exam:  WD, Overweight, 70 y/o WF in NAD... GENERAL:  Alert & oriented; pleasant & cooperative... HEENT:  [place]/AT, EOM-wnl, PERRLA, EACs-clear, TMs-wnl, NOSE-clear, THROAT-clear & wnl. NECK:  Supple w/ fairROM; no JVD; normal carotid impulses w/o bruits; no thyromegaly or nodules palpated; no lymphadenopathy. CHEST:  Clear to P & A; without wheezes/ rales/ or rhonchi... no trigger points. HEART:  Regular Rhythm; without murmurs/ rubs/ or gallops. ABDOMEN:  Soft & nontender; normal bowel sounds; no organomegaly or masses detected. EXT: without deformities, mild arthritic changes; no varicose veins/ venous insuffic/ or edema. NEURO:  CN's intact; motor testing normal; sensory testing normal; gait normal & balance OK. DERM:  No lesions noted; no rash etc...    MISC. Report  Procedure date:  12/31/2009  Findings:      Data Reviewed:  ~ Notes from TP- 07/28/09 & labs reviewed ...  ~  we rechecked B12 level 791...   Impression & Recommendations:  Problem # 1:  HYPERSOMNIA (ICD-780.54) She states improved & she's off the CPAP & Nuvigil... encouraged to f/u w/ DrYoung...  Problem # 2:  HYPERTENSION (ICD-401.9) Controlled>  continue same meds. Her updated medication list for this problem includes:    Diovan 160 Mg Tabs  (Valsartan) .[name]... Take 1 tablet by mouth once a day    Amlodipine Besylate 10 Mg Tabs (Amlodipine besylate) .[name]... Take 1 tab by mouth once daily...  Problem # 3:  HYPERCHOLESTEROLEMIA (ICD-272.0) She is content to treat w/ diet + exercise...  Problem # 4:  HYPOTHYROIDISM (ICD-244.9) Stable on Synthroid - 1/2 daily... Her updated medication list for this problem includes:    Synthroid 125 Mcg Tabs (Levothyroxine sodium) .[name]... Take 1/2  tablet by mouth once a day  Problem # 5:  HEMORRHOIDS (ICD-455.6) GI per [facility]...  Problem # 6:  DEGENERATIVE JOINT DISEASE (ICD-715.90) Followed by DrMedoff>  stable. Her updated medication list for this problem includes:    Adult  Aspirin Ec Low Strength 81 Mg Tbec (Aspirin) .[name]... 1 by mouth daily  Problem # 7:  Hx of DEPRESSION, SITUATIONAL (ICD-309.0) She is under the care of psychologist & Lindaann Slough on Citalopram now...  Problem # 8:  OTHER MEDICAL PROBLEMS AS NOTED>>>  Complete Medication List: 1)  Patanase 0.6 % Soln (Olopatadine hcl) .[name].. 1-2 sprays each nostril up to two times a day 2)  Allegra 180 Mg Tabs (Fexofenadine hcl) .... Take 1 tablet by mouth once a day 3)  Mucinex 600 Mg Tb12 (Guaifenesin) .[name].. 1-2 tabs by mouth two times a day as needed 4)  Adult Aspirin Ec Low Strength 81 Mg Tbec (Aspirin) .[name].. 1 by mouth daily 5)  Diovan 160 Mg Tabs (Valsartan) .... Take 1 tablet by mouth once a day 6)  Amlodipine Besylate 10 Mg Tabs (Amlodipine besylate) .... Take 1 tab by mouth once daily.Marland KitchenMarland Kitchen 7)  Fish Oil 1200 Mg Caps (Omega-3 fatty acids) .... Take one capsule by mouth two times a day 8)  Synthroid 125 Mcg Tabs (Levothyroxine sodium) .... Take 1/2  tablet by mouth once a day 9)  Cvs Calcium 600 + D 600-400 Mg-unit Tabs (Calcium carbonate-vitamin d) .... 2 by mouth daily 10)  Multivitamins Tabs (Multiple vitamin) .[name].. 1 by mouth daily 11)  Vitamin D 1000 Unit Tabs (Cholecalciferol) .[name].. 1 by mouth two times a day 12)  Citalopram  Hydrobromide 20 Mg Tabs (Citalopram hydrobromide) .... Take 1 tablet by mouth once a day  Other Orders: TLB-B12, Serum-Total ONLY ([id number])  Patient Instructions: 1)  Today we updated your med list- see below.... 2)  We refilled your meds as requested... 3)  Today we checked your B12 level...  please call the "phone tree" in a few days for your lab results.Marland KitchenMarland Kitchen 4)  After this returns you can decide about trying a B12 shot if you would like to try it... 5)  Stay as active as possible... 6)  Call for any questions... Prescriptions: SYNTHROID 125 MCG  TABS (LEVOTHYROXINE SODIUM) Take 1/2  tablet by mouth once a day  #90 x 4   Entered and Authorized by:   Michele Mcalpine MD   Signed by:   Michele Mcalpine MD on 12/31/2009   Method used:   Print then Give to Patient   RxID:   [id number] AMLODIPINE BESYLATE 10 MG  TABS (AMLODIPINE BESYLATE) take 1 tab by mouth once daily...  #90 x 4   Entered and Authorized by:   Michele Mcalpine MD   Signed by:   Michele Mcalpine MD on 12/31/2009   Method used:   Print then Give to Patient   RxID:   [id number] DIOVAN 160 MG  TABS (VALSARTAN) Take 1 tablet by mouth once a day  #90 x 4   Entered and Authorized by:   Michele Mcalpine MD   Signed by:   Michele Mcalpine MD on 12/31/2009   Method used:   Print then Give to Patient   RxID:   [id number]    Immunization History:  Influenza Immunization History:    Influenza:  historical (10/13/2009)

## 2010-02-17 ENCOUNTER — Other Ambulatory Visit: Payer: Self-pay

## 2010-02-25 ENCOUNTER — Ambulatory Visit
Admission: RE | Admit: 2010-02-25 | Discharge: 2010-02-25 | Disposition: A | Discharge status: Home / Self Care | Payer: PRIVATE HEALTH INSURANCE | Source: Ambulatory Visit | Attending: Orthopedic Surgery | Admitting: Orthopedic Surgery

## 2010-02-25 DIAGNOSIS — M549 Dorsalgia, unspecified: Secondary | ICD-10-CM

## 2010-02-25 DIAGNOSIS — M5136 Other intervertebral disc degeneration, lumbar region: Secondary | ICD-10-CM

## 2010-03-03 ENCOUNTER — Encounter: Payer: Self-pay | Admitting: Pulmonary Disease

## 2010-03-04 ENCOUNTER — Telehealth (INDEPENDENT_AMBULATORY_CARE_PROVIDER_SITE_OTHER): Payer: Self-pay | Admitting: *Deleted

## 2010-03-09 NOTE — Progress Notes (Signed)
Summary: stay on diovan per SN  Phone Note From Pharmacy   Caller: ISABELL WITH [facility] Call For: NADEL  Summary of Call: Isabell with [facility] phoned earlier this week they received a prescription for Diavan 160mg  and it was not available and the doctor sent a fax  statint that they could change it to Losartan but the Diavan came back in stock and they shipped that out to the patient today. She just wanted to advise the office and make sure that it is ok to disregard the fax for Losartan. She can reached at [phone] Initial call taken by: Vedia Coffer,  March 04, 2010 4:59 PM  Follow-up for Phone Call        Diovan is now available and they have shipped this to the patient instead of Losartan. Pls advise if okay to stay on Diovan and void out Losartan RX.Michel Bickers [facility]  March 04, 2010 5:26 PM  Additional Follow-up for Phone Call Additional follow up Details #1::        per SN---yes this is ok to disregard the losartan rx and for pt to cont the diovan.  thanks Randell Loop CMA  March 05, 2010 1:45 PM   Pharmacist aware to ship diovan 160mg  and cancel order for Losartan. Per pharmacist, losartan has been cancelled and diovan shipped to the patient.  Additional Follow-up by: Michel Bickers CMA,  March 05, 2010 1:53 PM

## 2010-03-16 ENCOUNTER — Other Ambulatory Visit: Payer: Self-pay | Admitting: Obstetrics and Gynecology

## 2010-03-16 DIAGNOSIS — Z1231 Encounter for screening mammogram for malignant neoplasm of breast: Secondary | ICD-10-CM

## 2010-03-18 ENCOUNTER — Ambulatory Visit
Admission: RE | Admit: 2010-03-18 | Discharge: 2010-03-18 | Disposition: A | Discharge status: Home / Self Care | Payer: PRIVATE HEALTH INSURANCE | Source: Ambulatory Visit | Attending: Obstetrics and Gynecology | Admitting: Obstetrics and Gynecology

## 2010-03-18 DIAGNOSIS — Z1231 Encounter for screening mammogram for malignant neoplasm of breast: Secondary | ICD-10-CM

## 2010-03-18 NOTE — Medication Information (Signed)
Summary: Diovan / Charity fundraiser  Diovan / Charity fundraiser   Imported By: Lennie Odor 03/08/2010 14:20:10  _____________________________________________________________________  External Attachment:    Type:   Image     Comment:   External Document

## 2010-04-05 LAB — BASIC METABOLIC PANEL
BUN: 19 mg/dL (ref 6–23)
Calcium: 9.3 mg/dL (ref 8.4–10.5)
GFR calc non Af Amer: >60 mL/min (ref >60)
Glucose, Bld: 126 mg/dL — ABNORMAL HIGH (ref 70–99)

## 2010-05-17 ENCOUNTER — Other Ambulatory Visit: Payer: Self-pay | Admitting: Orthopedic Surgery

## 2010-05-17 DIAGNOSIS — M5136 Other intervertebral disc degeneration, lumbar region: Secondary | ICD-10-CM

## 2010-05-17 DIAGNOSIS — M549 Dorsalgia, unspecified: Secondary | ICD-10-CM

## 2010-05-21 ENCOUNTER — Ambulatory Visit
Admission: RE | Admit: 2010-05-21 | Discharge: 2010-05-21 | Disposition: A | Discharge status: Home / Self Care | Payer: PRIVATE HEALTH INSURANCE | Source: Ambulatory Visit | Attending: Orthopedic Surgery | Admitting: Orthopedic Surgery

## 2010-05-21 DIAGNOSIS — M5136 Other intervertebral disc degeneration, lumbar region: Secondary | ICD-10-CM

## 2010-05-21 DIAGNOSIS — M549 Dorsalgia, unspecified: Secondary | ICD-10-CM

## 2010-05-25 NOTE — Assessment & Plan Note (Signed)
[facility]                             PULMONARY OFFICE NOTE   NAME:Theresa Mejia, Theresa Mejia                        MRN:          [id number]  DATE:08/04/2006                            DOB:          [birth year]    HISTORY OF PRESENT ILLNESS:  The patient is a 70 year old white female  patient of Dr. Jodelle Green who has a known history of allergic rhinitis,  hypertension, hypothyroidism, who presents today for an acute office  visit.  She complains of a 3-day history of right ear fullness, nasal  congestion, sinus pain and pressure, and post-nasal drip.  She denies  any purulent sputum, fever, chest pain, shortness of breath, recent  travel, or antibiotic use.   PAST MEDICAL HISTORY:  Reviewed.   CURRENT MEDICATIONS:  Reviewed.   PHYSICAL EXAM:  The patient is a pleasant female in no acute distress.  She is afebrile with stable vital signs.  O2 saturation 94% on room air.  HEENT:  Nasal mucosa is pale.  Nontender sinuses.  TMs normal.  EACs  clear.  NECK:  Supple without cervical adenopathy.  No JVD.  LUNGS:  Clear.  CARDIAC:  Regular rate.  ABDOMEN:  Soft and nontender.  EXTREMITIES:  Warm without any edema.   IMPRESSION AND PLAN:  Acute upper respiratory infection/rhinitis flare.  The patient is to begin Mucinex twice daily.  Nasonex nasal spray 2  puffs twice daily.  Saline nasal spray as needed.  The patient may have  a Z-Pak to have on hold in case symptoms worsen with purulent sputum.      Theresa Oaks, NP  Electronically Signed      Lonzo Cloud. [name], MD  Electronically Signed   TP/MedQ  DD: 08/04/2006  DT: 08/05/2006  Job #: [id number]

## 2010-05-25 NOTE — Procedures (Signed)
NAMEHESSIE, Theresa Mejia                 ACCOUNT NO.:  [id number]   MEDICAL RECORD NO.:  [id number]          PATIENT TYPE:  OUT   LOCATION:  SLEEP CENTER                 FACILITY:  [facility]   PHYSICIAN:  Clinton D. Maple Hudson, MD, FCCP, FACPDATE OF BIRTH:  Jun 05, 1942   DATE OF STUDY:  04/29/2007                            NOCTURNAL POLYSOMNOGRAM   REFERRING PHYSICIAN:   REFERRING PHYSICIAN:  Lonzo Cloud. [name], MD   INDICATION FOR STUDY:  Hypersomnia with sleep apnea.   EPWORTH SLEEPINESS SCORE:  9/24.  BMI 33.3.  Weight 176 pounds.  Height  61 inches.  Neck 14.5 inches.   HOME MEDICATIONS:  Charted and reviewed.   SLEEP ARCHITECTURE:  Total sleep time 373 minutes with sleep efficiency  76%.  Stage 1 was 9.7%, stage 2 82.8%, stage 3 0.7%.  REM 6.8% of total  sleep time.  Sleep latency 44 minutes, REM latency 200 minutes.  Awake  after sleep onset 61 minutes.  Arousal index 26.4.  No bedtime  medication was taken.   RESPIRATORY DATA:  Apnea hypopnea index (AHI) 1.1 per hour.  Respiratory  disturbance index (RDI) was 21.2 per hour which would indicate moderate  obstructive apnea.  It is unusual to have such a discrepancy between the  AHI and the RDI.  Events were mostly associated with supine sleep  position.  REM/AHI zero.  Diagnostic NPSG protocol was ordered and  followed.   OXYGEN DATA:  Moderately loud snoring with oxygen desaturation to a  nadir of 85%.  Mean oxygen saturation through the study was 91.8% on  room air.   CARDIAC DATA:  Normal sinus rhythm.   MOVEMENT-PARASOMNIA:  No significant movement disturbance.  No bathroom  trips.   IMPRESSIONS-RECOMMENDATIONS:  1. Mild to moderate obstructive sleep apnea, AHI 1.1 per hour, RDI      21.2 per hour.  Events were positional, more common with supine.      Respiratory events related to arousal averaged 20.1 per hour.  2. Consider return for CPAP titration or evaluate for alternatives      therapies as appropriate.  3. Sleep  architecture was marked by frequent brief wakings which may      correspond to the patient's complaint of daytime fatigue.      Clinton D. Maple Hudson, MD, [facility], FACP  Diplomate, Biomedical engineer of Sleep Medicine  Electronically Signed     CDY/MEDQ  D:  05/05/2007 09:44:21  T:  05/05/2007 10:21:39  Job:  [id number]

## 2010-06-10 ENCOUNTER — Telehealth: Payer: Self-pay | Admitting: Pulmonary Disease

## 2010-06-10 MED ORDER — VALSARTAN 160 MG PO TABS: 160.0000 mg | ORAL_TABLET | Freq: Every day | ORAL | Status: DC

## 2010-06-10 NOTE — Telephone Encounter (Signed)
Spoke with pt and notified that the rx for diovan was sent to [facility]. Pt verbalized understanding.

## 2010-08-16 ENCOUNTER — Ambulatory Visit: Payer: PRIVATE HEALTH INSURANCE | Attending: Surgery | Admitting: Physical Therapy

## 2010-08-16 DIAGNOSIS — M545 Low back pain, unspecified: Secondary | ICD-10-CM | POA: Insufficient documentation

## 2010-08-16 DIAGNOSIS — IMO0001 Reserved for inherently not codable concepts without codable children: Secondary | ICD-10-CM | POA: Insufficient documentation

## 2010-08-16 DIAGNOSIS — M25559 Pain in unspecified hip: Secondary | ICD-10-CM | POA: Insufficient documentation

## 2010-08-16 DIAGNOSIS — M25659 Stiffness of unspecified hip, not elsewhere classified: Secondary | ICD-10-CM | POA: Insufficient documentation

## 2010-08-18 ENCOUNTER — Ambulatory Visit: Payer: PRIVATE HEALTH INSURANCE | Admitting: Physical Therapy

## 2010-08-24 ENCOUNTER — Ambulatory Visit: Payer: PRIVATE HEALTH INSURANCE | Admitting: Physical Therapy

## 2010-08-26 ENCOUNTER — Ambulatory Visit: Payer: PRIVATE HEALTH INSURANCE | Admitting: Physical Therapy

## 2010-08-30 ENCOUNTER — Ambulatory Visit: Payer: PRIVATE HEALTH INSURANCE | Admitting: Physical Therapy

## 2010-09-01 ENCOUNTER — Encounter: Payer: Self-pay | Admitting: Pulmonary Disease

## 2010-09-01 ENCOUNTER — Other Ambulatory Visit (INDEPENDENT_AMBULATORY_CARE_PROVIDER_SITE_OTHER): Payer: PRIVATE HEALTH INSURANCE

## 2010-09-01 ENCOUNTER — Other Ambulatory Visit: Payer: Self-pay | Admitting: Pulmonary Disease

## 2010-09-01 ENCOUNTER — Ambulatory Visit (INDEPENDENT_AMBULATORY_CARE_PROVIDER_SITE_OTHER): Payer: PRIVATE HEALTH INSURANCE | Admitting: Pulmonary Disease

## 2010-09-01 DIAGNOSIS — E7800 Pure hypercholesterolemia, unspecified: Secondary | ICD-10-CM

## 2010-09-01 DIAGNOSIS — R5383 Other fatigue: Secondary | ICD-10-CM

## 2010-09-01 DIAGNOSIS — M47812 Spondylosis without myelopathy or radiculopathy, cervical region: Secondary | ICD-10-CM

## 2010-09-01 DIAGNOSIS — F4321 Adjustment disorder with depressed mood: Secondary | ICD-10-CM

## 2010-09-01 DIAGNOSIS — R5381 Other malaise: Secondary | ICD-10-CM

## 2010-09-01 DIAGNOSIS — I10 Essential (primary) hypertension: Secondary | ICD-10-CM

## 2010-09-01 DIAGNOSIS — K649 Unspecified hemorrhoids: Secondary | ICD-10-CM

## 2010-09-01 DIAGNOSIS — E039 Hypothyroidism, unspecified: Secondary | ICD-10-CM

## 2010-09-01 DIAGNOSIS — M1990 Unspecified osteoarthritis, unspecified site: Secondary | ICD-10-CM

## 2010-09-01 DIAGNOSIS — M519 Unspecified thoracic, thoracolumbar and lumbosacral intervertebral disc disorder: Secondary | ICD-10-CM | POA: Insufficient documentation

## 2010-09-01 DIAGNOSIS — G4733 Obstructive sleep apnea (adult) (pediatric): Secondary | ICD-10-CM

## 2010-09-01 LAB — BASIC METABOLIC PANEL
BUN: 18 mg/dL (ref 6–23)
CO2: 29 mEq/L (ref 19–32)
Calcium: 9.2 mg/dL (ref 8.4–10.5)
GFR: 94.75 mL/min (ref >60.00)
Glucose, Bld: 79 mg/dL (ref 70–99)

## 2010-09-01 LAB — HEPATIC FUNCTION PANEL
AST: 23 U/L (ref 0–37)
Albumin: 4.5 g/dL (ref 3.5–5.2)
Total Protein: 7.6 g/dL (ref 6.0–8.3)

## 2010-09-01 LAB — LIPID PANEL
HDL: 48.2 mg/dL (ref >39.00)
Triglycerides: 63 mg/dL (ref 0.0–149.0)

## 2010-09-01 LAB — CBC WITH DIFFERENTIAL/PLATELET
Basophils Absolute: 0 10*3/uL (ref 0.0–0.1)
Eosinophils Relative: 2 % (ref 0.0–5.0)
HCT: 41.3 % (ref 36.0–46.0)
Hemoglobin: 13.9 g/dL (ref 12.0–15.0)
Lymphocytes Relative: 30.6 % (ref 12.0–46.0)
Lymphs Abs: 1.6 10*3/uL (ref 0.7–4.0)
Monocytes Relative: 7.9 % (ref 3.0–12.0)
Platelets: 294 10*3/uL (ref 150.0–400.0)
RDW: 13.1 % (ref 11.5–14.6)
WBC: 5.2 10*3/uL (ref 4.5–10.5)

## 2010-09-01 LAB — TSH: TSH: 1.1 u[IU]/mL (ref 0.35–5.50)

## 2010-09-01 NOTE — Patient Instructions (Addendum)
Today we updated your med list in EPIC...    Continue your current meds the same for now...  Today we did your follow up fasting blood work...    Please call the PHONE TREE in a few days for your results...    Dial [id number] & when prompted enter your patient number followed by the # symbol...    Your patient number is:   [id number]#  Stay as active as possible> elliptical exercises, bike, swim, yoga, etc...  Call for any questions...   ADDENDUM: FLP ret w/ LDL 148 & rec to start Simva20, she will decide.Marland KitchenMarland Kitchen

## 2010-09-01 NOTE — Progress Notes (Signed)
Subjective:    Patient ID: Theresa Mejia, female    DOB: March 18, 1940, 70 y.o.   MRN: [id number]  HPI 70 y/o WF here for a follow up visit...   ~  Apr09:  her BP was well controlled prev on ATENOLOL 25mg /d and DIOVAN 160mg - 1/2 tab daily w/ BP's ~120/80 range... she requested to stop the Atenolol due to fatigue and insomnia, and decided to incr the Diovan to 160mg /d... since then she notes that her fatigue is no better, and her BP checks at the [facility] where she [facility] have been 150-160/ 80-90 range (on the Diovan 160mg /d alone)... she is not really on any spec diet and has gained 5# since last OV...  She also notes that she is quite "overwhelmed" (husb out of work >61yr, sister in law died, 9 y/o mother in her own apt, etc...) and is seeing a Haematologist for this and depression... she requests to incr her EFFEXOR XR from 75 to 150mg /d... she also notes not resting well/ sleeps poorly/ snores but no obious apneas according to her husb... we will pursue this w/ a sleep study...  ~  December 17, 2008:  she returns due to new bladder symptoms w/ "gotta go" urges and freq small amts w/ "pressure" and "a funny feeling"; but she denies burning or pain... UA & C&S last month were normal... GYN is DrDickstein w/ PAP Qoyr now & no prev bladder prob identified... She has had a busy yr- stress continues w/ 64 y/o mother in [place] now, on Hospice for FTT; and husb had MI 7/10 w/ more stents placed... she just had colon DrMedoff- neg x hems... SleepStudy last yr w/ OSA & hypersomnia- on CPAP 9 and Nuvigil per DrYoung...  ~  December 31, 2009:  she states that she's had a "rough yr"> 96y/o mother released from hospice & more work for her; c/o arthritis in shoulders/ hips/ etc> eval by DrAlusio w/ rotator cuff prob & Rx w/ Ibuprofen "I do yoga" & she is encouraged to f/u w/ Ortho... SEE PROB LIST BELOW>>>  ~  September 01, 2010:  29mo ROV & [place] has her persistant/chronic fatigue but still works for the  [employer] etc;  [name] had LBP & saw DrAlusio w/ MRI 9/11 showing DDD, multilevel spondylosis, & scoliosis> she has received 2 epid steroid shots w/ min improvement but states the PT/ exercise/ yoga is helping & she's taking Ibuprofen as well...     BP controlled on Norvasc & Diovan & measures 124/82 today; she expressed concern for BP meds & maybe she doesn't need tight control anymore & we discussed this & decided to continue the same, +diet, exercise, get wt down...  Due for f/u FASTING labs today    Still under stress w/ 58 y/o mother in [place] w/ $$trouble etc; she sees DrPoulas & psychologist Justine Null who switched her Celexa to [facility] & recently incr to 40mg /d...          Problem List:   HYPERSOMNIA (ICD-780.54) & OBSTRUCTIVE SLEEP APNEA (ICD-327.23) - prev on CPAP 9cmH2O pressure and Nuvigil 150mg  Prn per DrYoung, but she stopped on her own in 2011 (states no better on the Rx)...  Although she is chr fatigued, she feels that she rests OK & didn't notice any improvement while on the CPAP in past... ~  Sleep Study 4/09 showed AHI= 1/hr, RDI= 21/hr worse supine, +snoring & desat to 85%... ~  Sleep consult DrYoung w/ rec for CPAP- this controlled her OSA but  the hypersomnia persisted & trial Nuvigil Rx... ~  12/11:  pt reports that she stopped CPAP & Nuvigil on her own- citing no improvement on therapy.  ALLERGIC RHINITIS (ICD-477.9) - uses OTC Allegra, Mucinex, etc...  HYPERTENSION (ICD-401.9) - on ASA 81mg /d, AMLODIPINE 10mg /d,  DIOVAN 160mg /d... BP today = 124/82 & similar at home... denies HA, fatigue, visual changes, CP, palipit, dizziness, syncope, dyspnea, edema, etc...  ~  CXR 6/10 showed borderline Cor, clear lungs, WNL.[name]. ~  8/12:  States she's been reading that maybe she doesn't need all this stuff, that it's prob ok if it's higher (aked to show me the data); rec to continue meds!  HYPERCHOLESTEROLEMIA (ICD-272.0) - she has not been on diet or meds (she declined to start  Simva20 in 2009, prefers diet alone). ~  FLP 12/07 showed TChol 184, TG 77, HDL 42, LDL 126... diet & ex recommended. ~  FLP 1/09 on diet showed TChol 189, TG 60, HDL 40, LDL 137... Simvastatin 20mg /d started. ~  FLP 2/10 on Simva20 showed TChol 173, TG 57, HDL 39, LDL 122... same med, better diet, get wt down. ~  Now she states that she never really took the Simva20, & just takes FISH OIL supplements. ~  FLP 7/11 on diet alone showed TChol 181, TG 119, HDL 37, LDL 120....she will continue diet. ~  FLP 8/12 on diet alone showed TChol 208, TG 63, HDL 48, LDL 148... The worst yet, rec Simva20, she will decide.  HYPOTHYROIDISM (ICD-244.9) - stable on SYNTHROID - 1/2 tab daily... ~  labs 12/07 showed TSh = 1.77 ~  labs 1/09 showed TSH = 1.47... continue same. ~  labs 2/10 showed TSH= 1.48 ~  labs 7/11 showed TSH= 1.76... Continue same. ~  Labs 8/12 showed TSH = 1.10  HEMATOCHEZIA (ICD-578.1) & HEMORRHOIDS (ICD-455.6) - she had one episode of BRB in stool/ bowel... mod amt, no pain, no recurrence per pt... she denies D/C etc... her last colonoscopy was 6/03 by [facility] & was normal w/ f/u planned 18yrs... she will call [facility] for f/u. ~  f/u colonoscopy 11/10 by [facility] showed hems, otherw neg...  DEGENERATIVE JOINT DISEASE (ICD-715.90) - she's had shoulder and knee surgery in past... uses OTC meds and had physical therapy rec by DrAlusio who also gave her a shot in R PSIS area for pain... she tells me she has rotator cuff prob & hip pain> both evaluated by DrAlusio & treated w/ OTC anti-inflamm/ Ibuprofen... SPONDYLOSIS, CERVICAL (ICD-721.0) LUMBAR DDD & SPONDYLOSIS > she had epid steroid shots 2/12 & 5/12 w/ min improvement she says... ~  She notes that PT/ exercises/ yoga/ Ibuprofen all seem to help some...  Hx of HEADACHE, MIXED (ICD-784.0)  Hx of DEPRESSION, SITUATIONAL (ICD-309.0) - her sister-in-law passed away, still cares for 57 y/o mother... stress level is high (husb out of  work x 38yr, MI 7/10)... she is no longer taking the Effexor, Wellbutrin, Zoloft, or Celexa per psychologist Youlanda Roys who works w/ [employer]  Now on [facility] 20==>40mg /d...  HEALTH MAINT:  she sees DrDickstein for GYN, [facility]... DrMedoff for GI...  ~  Immunizations:  she gets the yearly Flu vaccine each autumn... PNEUMOVAX & TETANUS 2/10... Shingles vaccine at [facility] 4/10...   Past Surgical History  Procedure Date  . Rotator cuff surgery     Dr. Teressa Senter  . Knee arthroscopy     Dr. Despina Hick  . Tubal ligation     Outpatient Encounter  Prescriptions as of 09/01/2010  Medication Sig Dispense Refill  . amLODipine (NORVASC) 10 MG tablet Take 10 mg by mouth daily.        [name] aspirin 81 MG tablet Take 81 mg by mouth daily.        . Calcium Carbonate-Vitamin D (CALCIUM 600+D) 600-400 MG-UNIT per tablet Take 2 tablets by mouth daily.        . cholecalciferol (VITAMIN D) 1000 UNITS tablet Take 1,000 Units by mouth 2 (two) times daily.        . fexofenadine (ALLEGRA) 180 MG tablet Take 180 mg by mouth daily.        [name] FLUoxetine (PROZAC) 40 MG capsule Take 40 mg by mouth daily.        [name] guaiFENesin (MUCINEX) 600 MG 12 hr tablet Take 1,200 mg by mouth 2 (two) times daily.        [name] levothyroxine (SYNTHROID, LEVOTHROID) 125 MCG tablet takie 1/2 tablet by mouth once daily       . Multiple Vitamin (MULTIVITAMIN) capsule Take 1 capsule by mouth daily.        . Olopatadine HCl (PATANASE) 0.6 % SOLN 1-2 sprays in each nostril up to two times daily       . Omega-3 Fatty Acids (FISH OIL) 1200 MG CAPS Take 1 capsule by mouth 2 (two) times daily.        . valsartan (DIOVAN) 160 MG tablet Take 1 tablet (160 mg total) by mouth daily.  90 tablet  3  . DISCONTD: citalopram (CELEXA) 20 MG tablet Take 20 mg by mouth daily.          Allergies  Allergen Reactions  . Ramipril     REACTION: pt states INTOLERANT to Altace  . Sulfonamide Derivatives     REACTION: hypotension     Current Medications, Allergies, Past Medical History, Past Surgical History, Family History, and Social History were reviewed in [employer] record.   Review of Systems        The patient complains of dyspnea on exertion, joint pain, stiffness, arthritis, depression, and anxiety.  The patient denies fever, chills, sweats, anorexia, fatigue, weakness, malaise, weight loss, sleep disorder, blurring, diplopia, eye irritation, eye discharge, vision loss, eye pain, photophobia, earache, ear discharge, tinnitus, decreased hearing, nasal congestion, nosebleeds, sore throat, hoarseness, chest pain, palpitations, syncope, orthopnea, PND, peripheral edema, cough, dyspnea at rest, excessive sputum, hemoptysis, wheezing, pleurisy, nausea, vomiting, diarrhea, constipation, change in bowel habits, abdominal pain, melena, hematochezia, jaundice, gas/bloating, indigestion/heartburn, dysphagia, odynophagia, dysuria, hematuria, urinary frequency, urinary hesitancy, nocturia, incontinence, back pain, joint swelling, muscle cramps, muscle weakness, sciatica, restless legs, leg pain at night, leg pain with exertion, rash, itching, dryness, suspicious lesions, paralysis, paresthesias, seizures, tremors, vertigo, transient blindness, frequent falls, frequent headaches, difficulty walking, memory loss, confusion, cold intolerance, heat intolerance, polydipsia, polyphagia, polyuria, unusual weight change, abnormal bruising, bleeding, enlarged lymph nodes, urticaria, allergic rash, hay fever, and recurrent infections.     Objective:   Physical Exam     WD, Overweight, 70 y/o WF in NAD... GENERAL:  Alert & oriented; pleasant & cooperative... HEENT:  [place]/AT, EOM-wnl, PERRLA, EACs-clear, TMs-wnl, NOSE-clear, THROAT-clear & wnl. NECK:  Supple w/ fairROM; no JVD; normal carotid impulses w/o bruits; no thyromegaly or nodules palpated; no lymphadenopathy. CHEST:  Clear to P & A; without wheezes/ rales/ or  rhonchi... no trigger points. HEART:  Regular Rhythm; without murmurs/ rubs/ or gallops. ABDOMEN:  Soft & nontender; normal bowel sounds; no organomegaly  or masses detected. EXT: without deformities, mild arthritic changes; no varicose veins/ venous insuffic/ or edema. NEURO:  CN's intact; motor testing normal; sensory testing normal; gait normal & balance OK. DERM:  No lesions noted; no rash etc...   Assessment & Plan:   OSA>  She stopped CPAP & Nuvigil rx from DrYoung stating no better on therapy; encouraged pt to f/u w/ DrYoung, Sleep Med for reassessment.  HBP>  Controlled on Amlodipine & Diovan; pt is rec to continue same despite her readings that maybe you don't need tight control of BP anymore (asked to show me the info she is referring to)...  CHOL>  FLP now represents the worst numbers in several yrs w/ LDL 148; advised low chol, low fat diet & rec to start Simvastatin 20mg .d- she will decide & let me know.  HYPOTHYROID>  Stable on Synthroid - 1/2 tab daily; continue same...  GI> Hems>  Followed by [facility] w/ colonoscopy 11/10 OK x hems...  DJD, DDD, Cerv&Lumbar Spondylosis etc>  followed by DrAlusio as above...  Depression>  Followed & treated by her psychologist Youlanda Roys & DrPoulas for meds, now on Prozac 40mg /d.Marland KitchenMarland Kitchen

## 2010-09-02 ENCOUNTER — Ambulatory Visit: Payer: PRIVATE HEALTH INSURANCE | Admitting: Physical Therapy

## 2010-09-06 ENCOUNTER — Ambulatory Visit: Payer: PRIVATE HEALTH INSURANCE | Admitting: Physical Therapy

## 2010-09-08 ENCOUNTER — Telehealth: Payer: Self-pay | Admitting: Pulmonary Disease

## 2010-09-08 ENCOUNTER — Encounter: Payer: Self-pay | Admitting: Pulmonary Disease

## 2010-09-08 MED ORDER — SIMVASTATIN 20 MG PO TABS: ORAL_TABLET | ORAL | Status: DC

## 2010-09-08 NOTE — Telephone Encounter (Signed)
Labs reviewed & phone tree recorded... SMN FLP w/ LDL up to 148> I rec Simva20 + diet, she will decide. other labs all wnl...   Spoke with pt and she verbalized understanding of the results. Pt states she would like [name] to mail her rx to her home along wither her labs. i have printed rx and labs for SN to sign so we can mail to her home. Will sign off messge

## 2010-09-09 ENCOUNTER — Ambulatory Visit: Payer: PRIVATE HEALTH INSURANCE | Admitting: Physical Therapy

## 2010-09-14 ENCOUNTER — Ambulatory Visit: Payer: PRIVATE HEALTH INSURANCE | Attending: Surgery | Admitting: Physical Therapy

## 2010-09-14 DIAGNOSIS — IMO0001 Reserved for inherently not codable concepts without codable children: Secondary | ICD-10-CM | POA: Insufficient documentation

## 2010-09-14 DIAGNOSIS — M545 Low back pain, unspecified: Secondary | ICD-10-CM | POA: Insufficient documentation

## 2010-09-14 DIAGNOSIS — M25559 Pain in unspecified hip: Secondary | ICD-10-CM | POA: Insufficient documentation

## 2010-09-14 DIAGNOSIS — M25659 Stiffness of unspecified hip, not elsewhere classified: Secondary | ICD-10-CM | POA: Insufficient documentation

## 2010-09-16 ENCOUNTER — Ambulatory Visit: Payer: PRIVATE HEALTH INSURANCE | Admitting: Physical Therapy

## 2010-09-21 ENCOUNTER — Encounter: Payer: PRIVATE HEALTH INSURANCE | Admitting: Physical Therapy

## 2010-09-23 ENCOUNTER — Encounter: Payer: PRIVATE HEALTH INSURANCE | Admitting: Physical Therapy

## 2010-10-05 ENCOUNTER — Telehealth: Payer: Self-pay | Admitting: Pulmonary Disease

## 2010-10-05 MED ORDER — FLUTICASONE PROPIONATE 50 MCG/ACT NA SUSP: 2.0000 | Freq: Every day | NASAL | Status: DC

## 2010-10-05 NOTE — Telephone Encounter (Signed)
Per SN---generic flonase  2 spray each nostril qhs.  Thanks

## 2010-10-05 NOTE — Telephone Encounter (Signed)
Spoke with pt and notified fluticasone rx was sent to pharm.

## 2010-10-05 NOTE — Telephone Encounter (Signed)
Spoke with pt. She is requesting that her labs be refaxed to her b/c she is missing the cholesterol numbers. Fax number verified and I have faxed these to her.  Pt is also asking for rx for generic nasal spray. Can we call in generic flonase? Pls advise, thanks!

## 2010-12-28 ENCOUNTER — Telehealth: Payer: Self-pay | Admitting: Pulmonary Disease

## 2010-12-28 MED ORDER — ONDANSETRON HCL 8 MG PO TABS: ORAL_TABLET | ORAL | Status: DC

## 2010-12-28 MED ORDER — HYDROCODONE-ACETAMINOPHEN 5-500 MG PO TABS: ORAL_TABLET | ORAL | Status: DC

## 2010-12-28 NOTE — Telephone Encounter (Signed)
ATC NA WCB 

## 2010-12-28 NOTE — Telephone Encounter (Signed)
I spoke with pt and she states she has been feeling very dizzy and she wakes up in the middle of the night due to being dizzy as well. Pt also c/o headache, right side neck pain, and feels nauseated x 3 days. Pt states she has been taking her meclizine as directed. Pt is requesting and apt to be seen today. Please advise Dr. [name], thanks  Allergies  Allergen Reactions  . Ramipril     REACTION: pt states INTOLERANT to Altace  . Sulfonamide Derivatives     REACTION: hypotension

## 2010-12-28 NOTE — Telephone Encounter (Signed)
Pt returned triage's call.  Holly D Pryor ° °

## 2010-12-28 NOTE — Telephone Encounter (Signed)
Pt aware of SN recs. Pt would like these rx's called into [facility] on [address]. I have called these medications into the pharmacy

## 2010-12-28 NOTE — Telephone Encounter (Signed)
Per SN---rest, heating pad to neck as needed, call in vicodin 5/500   #50  1 po tid prn pain, zofran  8mg   #25  1 po every 6 hours prn nausea, and ov with SN on Thursday at 3pm.  thanks

## 2010-12-30 ENCOUNTER — Encounter: Payer: Self-pay | Admitting: Pulmonary Disease

## 2010-12-30 ENCOUNTER — Ambulatory Visit (INDEPENDENT_AMBULATORY_CARE_PROVIDER_SITE_OTHER): Payer: PRIVATE HEALTH INSURANCE | Admitting: Pulmonary Disease

## 2010-12-30 DIAGNOSIS — G4733 Obstructive sleep apnea (adult) (pediatric): Secondary | ICD-10-CM

## 2010-12-30 DIAGNOSIS — J019 Acute sinusitis, unspecified: Secondary | ICD-10-CM

## 2010-12-30 DIAGNOSIS — I10 Essential (primary) hypertension: Secondary | ICD-10-CM

## 2010-12-30 DIAGNOSIS — E039 Hypothyroidism, unspecified: Secondary | ICD-10-CM

## 2010-12-30 DIAGNOSIS — J309 Allergic rhinitis, unspecified: Secondary | ICD-10-CM

## 2010-12-30 DIAGNOSIS — M1990 Unspecified osteoarthritis, unspecified site: Secondary | ICD-10-CM

## 2010-12-30 DIAGNOSIS — E7800 Pure hypercholesterolemia, unspecified: Secondary | ICD-10-CM

## 2010-12-30 MED ORDER — PREDNISONE (PAK) 5 MG PO TABS: ORAL_TABLET | ORAL | Status: DC

## 2010-12-30 MED ORDER — METHOCARBAMOL 500 MG PO TABS: 500.0000 mg | ORAL_TABLET | Freq: Three times a day (TID) | ORAL | Status: AC | PRN

## 2010-12-30 MED ORDER — AMOXICILLIN-POT CLAVULANATE 875-125 MG PO TABS: 1.0000 | ORAL_TABLET | Freq: Two times a day (BID) | ORAL | Status: AC

## 2010-12-30 MED ORDER — FLUOXETINE HCL 40 MG PO CAPS: 40.0000 mg | ORAL_CAPSULE | Freq: Every day | ORAL | Status: DC

## 2010-12-30 MED ORDER — AMLODIPINE BESYLATE 10 MG PO TABS: 10.0000 mg | ORAL_TABLET | Freq: Every day | ORAL | Status: DC

## 2010-12-30 MED ORDER — METHYLPREDNISOLONE ACETATE 80 MG/ML IJ SUSP
80.0000 mg | Freq: Once | INTRAMUSCULAR | Status: AC
  Administered 2010-12-30: 80 mg via INTRAMUSCULAR

## 2010-12-30 MED ORDER — FLUTICASONE PROPIONATE 50 MCG/ACT NA SUSP: 2.0000 | Freq: Every day | NASAL | Status: DC

## 2010-12-30 NOTE — Patient Instructions (Signed)
Today we updated your med list in our EPIC system...    Continue your current medications the same...    We refilled your Norvasc, Prozac & Patanase per request...  For your Sinus infection:     Continue the SALINE lavage...    We gave you a Depo shot & PredDosepak for the inflammation...    We wrote a new prescription for AUGMENTIN antibiotic to take twice daily til gone...  For your new discomfort:    We had prev called in Hydrocodone to your Pharm...    We wrote a new prescription for ROBAXIN to take up to 3 three times daily as needed for muscle spasm...  If your sinus symptoms fail to resolve we may need an ENT evaluation.Marland KitchenMarland Kitchen

## 2010-12-31 ENCOUNTER — Ambulatory Visit: Payer: PRIVATE HEALTH INSURANCE | Admitting: Internal Medicine

## 2011-01-16 ENCOUNTER — Encounter: Payer: Self-pay | Admitting: Pulmonary Disease

## 2011-01-16 NOTE — Progress Notes (Signed)
Subjective:    Patient ID: Theresa Mejia, female    DOB: Oct 30, 1940, 71 y.o.   MRN: [id number]  HPI 71 y/o WF here for a follow up visit...   ~  September 01, 2010:  47mo ROV & Theresa Mejia has her persistant/chronic fatigue but still works for the [employer] etc;  [name] had LBP & saw DrAlusio w/ MRI 9/11 showing DDD, multilevel spondylosis, & scoliosis> she has received 2 epid steroid shots w/ min improvement but states the PT/ exercise/ yoga is helping & she's taking Ibuprofen as well...     BP controlled on Norvasc & Diovan & measures 124/82 today; she expressed concern for BP meds & maybe she doesn't need tight control anymore & we discussed this & decided to continue the same, +diet, exercise, get wt down...     Still under stress w/ 33 y/o mother in [place] w/ $$trouble etc; she sees DrPoulas & psychologist Justine Null who switched her Celexa to [facility] & recently incr to 40mg /d...  ~  December 30, 2010:  45mo ROV & add-on for dizziness> "I feel like sh.." started w/ sinus congestion/pressure "like water in my ears" & neck pain & dizzy;  Heating pad & muscle relaxers helped neck & dizziness improved on it's own;  We discussed Rx w/ Augmentin, Depo/ Dosepak, Nasal saline, etc...  BP controlled on meds;  Still trying to treat lipids w/ diet alone despite yrs of poorly controlled numbers;  Thyroid ok on Synthroid Rx;  Hx DJD, LBP, DDD, HAs, etc;  She is still under the care of psychologist & Lindaann Slough on Prozac40.          Problem List:   HYPERSOMNIA (ICD-780.54) & OBSTRUCTIVE SLEEP APNEA (ICD-327.23) - prev on CPAP 9cmH2O pressure and Nuvigil 150mg  Prn per DrYoung, but she stopped on her own in 2011 (states no better on the Rx)...  Although she is chr fatigued, she feels that she rests OK & didn't notice any improvement while on the CPAP in past... ~  Sleep Study 4/09 showed AHI= 1/hr, RDI= 21/hr worse supine, +snoring & desat to 85%... ~  Sleep consult DrYoung w/ rec for CPAP- this controlled  her OSA but the hypersomnia persisted & trial Nuvigil Rx... ~  12/11:  pt reports that she stopped CPAP & Nuvigil on her own- citing no improvement on therapy & she is encouraged to f/u w/ DrYoung...  ALLERGIC RHINITIS (ICD-477.9) - uses OTC Allegra, Mucinex, etc...  HYPERTENSION (ICD-401.9) - on ASA 81mg /d, AMLODIPINE 10mg /d,  DIOVAN 160mg /d... Prev intol Atenolol w/ fatigue & insomnia (but not much better off the med). ~  CXR 6/10 showed borderline Cor, clear lungs, WNL.[name]. ~  8/12:  BP= 124/82 & similar at home; denies HA, fatigue, visual changes, CP, palipit, dizziness, syncope, dyspnea, edema, etc; states she's been reading that maybe she doesn't need all this stuff, that it's prob ok if it's higher (asked to show me the data); rec to continue meds! ~  12/12: BP= 126/72 & denies CP, palpit, SOB, edema...  HYPERCHOLESTEROLEMIA (ICD-272.0) - she has not been on diet or meds (she declined to start Simva20 in 2009, prefers diet alone). ~  FLP 12/07 showed TChol 184, TG 77, HDL 42, LDL 126... diet & ex recommended. ~  FLP 1/09 on diet showed TChol 189, TG 60, HDL 40, LDL 137... Simvastatin 20mg /d started. ~  FLP 2/10 on Simva20 showed TChol 173, TG 57, HDL 39, LDL 122... same med, better diet, get wt down. ~  Now she states that she never really took the Simva20, & just takes FISH OIL supplements. ~  FLP 7/11 on diet alone showed TChol 181, TG 119, HDL 37, LDL 120....she will continue diet. ~  FLP 8/12 on diet alone showed TChol 208, TG 63, HDL 48, LDL 148... The worst yet, rec Simva20, she will decide.  HYPOTHYROIDISM (ICD-244.9) - stable on SYNTHROID - 1/2 tab daily... ~  labs 12/07 showed TSh = 1.77 ~  labs 1/09 showed TSH = 1.47... continue same. ~  labs 2/10 showed TSH= 1.48 ~  labs 7/11 showed TSH= 1.76... Continue same. ~  Labs 8/12 showed TSH = 1.10  HEMATOCHEZIA (ICD-578.1) & HEMORRHOIDS (ICD-455.6) - she had one episode of BRB in stool/ bowel... mod amt, no pain, no recurrence  per pt... she denies D/C etc... her last colonoscopy was 6/03 by [facility] & was normal w/ f/u planned 47yrs... she will call [facility] for f/u. ~  f/u colonoscopy 11/10 by [facility] showed hems, otherw neg...  DEGENERATIVE JOINT DISEASE (ICD-715.90) - she's had shoulder and knee surgery in past... uses OTC meds and had physical therapy rec by DrAlusio who also gave her a shot in R PSIS area for pain... she tells me she has rotator cuff prob & hip pain> both evaluated by DrAlusio & treated w/ OTC anti-inflamm/ Ibuprofen... SPONDYLOSIS, CERVICAL (ICD-721.0) LUMBAR DDD & SPONDYLOSIS > she had epid steroid shots 2/12 & 5/12 w/ min improvement she says... ~  She notes that PT/ exercises/ yoga/ Ibuprofen all seem to help some...  Hx of HEADACHE, MIXED (ICD-784.0)  Hx of DEPRESSION, SITUATIONAL (ICD-309.0) - her sister-in-law passed away, still cares for 52 y/o mother... stress level is high (husb out of work x 57yr, MI 7/10)... she is no longer taking the Effexor, Wellbutrin, Zoloft, or Celexa per psychologist Youlanda Roys who works w/ Lindaann Slough  Now on PROZAC 20==>40mg /d...  HEALTH MAINT:  she sees DrDickstein for GYN, [facility]... DrMedoff for GI...  ~  Immunizations:  she gets the yearly Flu vaccine each autumn... PNEUMOVAX & TETANUS 2/10... Shingles vaccine at [facility] 4/10...   Past Surgical History  Procedure Date  . Rotator cuff surgery     Dr. Teressa Senter  . Knee arthroscopy     Dr. Despina Hick  . Tubal ligation     Outpatient Encounter Prescriptions as of 12/30/2010  Medication Sig Dispense Refill  . amLODipine (NORVASC) 10 MG tablet Take 1 tablet (10 mg total) by mouth daily.  30 tablet  11  . aspirin 81 MG tablet Take 81 mg by mouth daily.        . cholecalciferol (VITAMIN D) 1000 UNITS tablet Take 1,000 Units by mouth 2 (two) times daily.        [name] FLUoxetine (PROZAC) 40 MG capsule Take 1 capsule (40 mg total) by mouth daily. Per Dr. Azucena Fallen  30 capsule  11  .  fluticasone (FLONASE) 50 MCG/ACT nasal spray Place 2 sprays into the nose at bedtime.  16 g  12  . guaiFENesin (MUCINEX) 600 MG 12 hr tablet Take 1,200 mg by mouth 2 (two) times daily.        [name] levothyroxine (SYNTHROID, LEVOTHROID) 125 MCG tablet takie 1/2 tablet by mouth once daily       . loratadine (CLARITIN) 10 MG tablet Take 10 mg by mouth daily.        . Multiple Vitamin (MULTIVITAMIN) capsule Take 1 capsule by mouth daily.        [name]  Omega-3 Fatty Acids (FISH OIL) 1200 MG CAPS Take 1 capsule by mouth 2 (two) times daily.        . valsartan (DIOVAN) 160 MG tablet Take 1 tablet (160 mg total) by mouth daily.  90 tablet  3  . amoxicillin-clavulanate (AUGMENTIN) 875-125 MG per tablet Take 1 tablet by mouth 2 (two) times daily.  20 tablet  0  . methocarbamol (ROBAXIN) 500 MG tablet Take 1 tablet (500 mg total) by mouth 3 (three) times daily as needed (muscle spasms).  50 tablet  0  . predniSONE (STERAPRED UNI-PAK) 5 MG TABS Take as directed  1 each  0  . methylPREDNISolone acetate (DEPO-MEDROL) injection 80 mg         Allergies  Allergen Reactions  . Ramipril     REACTION: pt states INTOLERANT to Altace  . Sulfonamide Derivatives     REACTION: hypotension    Current Medications, Allergies, Past Medical History, Past Surgical History, Family History, and Social History were reviewed in [employer] record.   Review of Systems        The patient complains of dyspnea on exertion, joint pain, stiffness, arthritis, depression, and anxiety.  The patient denies fever, chills, sweats, anorexia, fatigue, weakness, malaise, weight loss, sleep disorder, blurring, diplopia, eye irritation, eye discharge, vision loss, eye pain, photophobia, earache, ear discharge, tinnitus, decreased hearing, nasal congestion, nosebleeds, sore throat, hoarseness, chest pain, palpitations, syncope, orthopnea, PND, peripheral edema, cough, dyspnea at rest, excessive sputum, hemoptysis, wheezing,  pleurisy, nausea, vomiting, diarrhea, constipation, change in bowel habits, abdominal pain, melena, hematochezia, jaundice, gas/bloating, indigestion/heartburn, dysphagia, odynophagia, dysuria, hematuria, urinary frequency, urinary hesitancy, nocturia, incontinence, back pain, joint swelling, muscle cramps, muscle weakness, sciatica, restless legs, leg pain at night, leg pain with exertion, rash, itching, dryness, suspicious lesions, paralysis, paresthesias, seizures, tremors, vertigo, transient blindness, frequent falls, frequent headaches, difficulty walking, memory loss, confusion, cold intolerance, heat intolerance, polydipsia, polyphagia, polyuria, unusual weight change, abnormal bruising, bleeding, enlarged lymph nodes, urticaria, allergic rash, hay fever, and recurrent infections.     Objective:   Physical Exam     WD, Overweight, 71 y/o WF in NAD... GENERAL:  Alert & oriented; pleasant & cooperative... HEENT:  [place]/AT, EOM-wnl, PERRLA, EACs-clear, TMs-wnl, NOSE- sl red, THROAT-clear & wnl. NECK:  Supple w/ fairROM; no JVD; normal carotid impulses w/o bruits; no thyromegaly or nodules palpated; no lymphadenopathy. CHEST:  Clear to P & A; without wheezes/ rales/ or rhonchi... no trigger points. HEART:  Regular Rhythm; without murmurs/ rubs/ or gallops. ABDOMEN:  Soft & nontender; normal bowel sounds; no organomegaly or masses detected. EXT: without deformities, mild arthritic changes; no varicose veins/ venous insuffic/ or edema. NEURO:  CN's intact; motor testing normal; sensory testing normal; gait normal & balance OK. DERM:  No lesions noted; no rash etc...  RADIOLOGY DATA:  Reviewed in the EPIC EMR & discussed w/ the patient...  LABORATORY DATA:  Reviewed in the EPIC EMR & discussed w/ the patient...   Assessment & Plan:   URI/ Bronchitis>  We discussed Rx for her URI w/ Augmewntin, Depo/ Pred, etc...   OSA>  She stopped CPAP & Nuvigil rx from DrYoung stating no better on therapy;  encouraged pt to f/u w/ DrYoung, Sleep Med for reassessment.  HBP>  Controlled on Amlodipine & Diovan; pt is rec to continue same despite her readings that maybe you don't need tight control of BP anymore (asked to show me the info she is referring to)...  CHOL>  FLP  now represents the worst numbers in several yrs w/ LDL 148; advised low chol, low fat diet & rec to start Simvastatin 20mg .d- she will decide & let me know.  HYPOTHYROID>  Stable on Synthroid - 1/2 tab daily; continue same...  GI> Hems>  Followed by [facility] w/ colonoscopy 11/10 OK x hems...  DJD, DDD, Cerv&Lumbar Spondylosis etc>  followed by DrAlusio as above...  Depression>  Followed & treated by her psychologist Youlanda Roys & DrPoulas for meds, now on Prozac 40mg /d...   Patient's Medications  New Prescriptions   PREDNISONE (STERAPRED UNI-PAK) 5 MG TABS    Take as directed  Previous Medications   ASPIRIN 81 MG TABLET    Take 81 mg by mouth daily.     CHOLECALCIFEROL (VITAMIN D) 1000 UNITS TABLET    Take 1,000 Units by mouth 2 (two) times daily.     GUAIFENESIN (MUCINEX) 600 MG 12 HR TABLET    Take 1,200 mg by mouth 2 (two) times daily.     LEVOTHYROXINE (SYNTHROID, LEVOTHROID) 125 MCG TABLET    takie 1/2 tablet by mouth once daily    LORATADINE (CLARITIN) 10 MG TABLET    Take 10 mg by mouth daily.     MULTIPLE VITAMIN (MULTIVITAMIN) CAPSULE    Take 1 capsule by mouth daily.     OMEGA-3 FATTY ACIDS (FISH OIL) 1200 MG CAPS    Take 1 capsule by mouth 2 (two) times daily.     VALSARTAN (DIOVAN) 160 MG TABLET    Take 1 tablet (160 mg total) by mouth daily.  Modified Medications   Modified Medication Previous Medication   AMLODIPINE (NORVASC) 10 MG TABLET amLODipine (NORVASC) 10 MG tablet      Take 1 tablet (10 mg total) by mouth daily.    Take 10 mg by mouth daily.     FLUOXETINE (PROZAC) 40 MG CAPSULE FLUoxetine (PROZAC) 40 MG capsule      Take 1 capsule (40 mg total) by mouth daily. Per Dr. Azucena Fallen    Take 40 mg by  mouth daily. Per Dr. Azucena Fallen   FLUTICASONE [facility]) 50 MCG/ACT NASAL SPRAY fluticasone (FLONASE) 50 MCG/ACT nasal spray      Place 2 sprays into the nose at bedtime.    Place 2 sprays into the nose at bedtime.  Discontinued Medications   CALCIUM CARBONATE-VITAMIN D (CALCIUM 600+D) 600-400 MG-UNIT PER TABLET    Take 2 tablets by mouth daily.     FEXOFENADINE (ALLEGRA) 180 MG TABLET    Take 180 mg by mouth daily.     HYDROCODONE-ACETAMINOPHEN (VICODIN) 5-500 MG PER TABLET    1 tablet 3 times a day as needed for pain   OLOPATADINE HCL (PATANASE) 0.6 % SOLN    1-2 sprays in each nostril up to two times daily    ONDANSETRON (ZOFRAN) 8 MG TABLET    Take 1 tablet by mouth every 6 hours as needed for nausea   SIMVASTATIN (ZOCOR) 20 MG TABLET    Take 1 tablet once a day

## 2011-01-28 ENCOUNTER — Encounter: Payer: Self-pay | Admitting: Internal Medicine

## 2011-01-28 ENCOUNTER — Ambulatory Visit: Payer: PRIVATE HEALTH INSURANCE | Admitting: Internal Medicine

## 2011-01-28 ENCOUNTER — Ambulatory Visit (INDEPENDENT_AMBULATORY_CARE_PROVIDER_SITE_OTHER): Payer: PRIVATE HEALTH INSURANCE | Admitting: Pulmonary Disease

## 2011-01-28 ENCOUNTER — Ambulatory Visit (INDEPENDENT_AMBULATORY_CARE_PROVIDER_SITE_OTHER): Payer: PRIVATE HEALTH INSURANCE | Admitting: Internal Medicine

## 2011-01-28 ENCOUNTER — Encounter: Payer: Self-pay | Admitting: Pulmonary Disease

## 2011-01-28 VITALS — BP 122/78 | HR 59 | Ht 60.0 in | Wt 162.4 lb

## 2011-01-28 DIAGNOSIS — G4710 Hypersomnia, unspecified: Secondary | ICD-10-CM

## 2011-01-28 DIAGNOSIS — G4733 Obstructive sleep apnea (adult) (pediatric): Secondary | ICD-10-CM

## 2011-01-28 MED ORDER — ZALEPLON 5 MG PO CAPS: ORAL_CAPSULE | ORAL | Status: DC

## 2011-01-28 MED ORDER — ARMODAFINIL 150 MG PO TABS: 150.0000 mg | ORAL_TABLET | Freq: Every day | ORAL | Status: DC

## 2011-01-28 NOTE — Assessment & Plan Note (Signed)
Pt to see DrYoung today for f/u OSA... Chart opened in error for SN visit... 

## 2011-01-28 NOTE — Progress Notes (Signed)
Pt to see DrYoung today for f/u OSA... Chart opened in error for SN visit.Marland KitchenMarland Kitchen

## 2011-01-28 NOTE — Patient Instructions (Signed)
Script for sonata generic to use for sleep if needed  Try Nuvigil 1/4 x 150 mg on the days you need it, every other day, or 2 days out of 3

## 2011-01-28 NOTE — Progress Notes (Deleted)
Subjective:    Patient ID: Theresa Mejia, female    DOB: [birth year], 71 y.o.   MRN: [id number]  URI   71 y/o WF here for a follow up visit...   ~  September 01, 2010:  27mo ROV & [name] has her persistant/chronic fatigue but still works for the [employer] etc;  [name] had LBP & saw DrAlusio w/ MRI 9/11 showing DDD, multilevel spondylosis, & scoliosis> she has received 2 epid steroid shots w/ min improvement but states the PT/ exercise/ yoga is helping & she's taking Ibuprofen as well...     BP controlled on Norvasc & Diovan & measures 124/82 today; she expressed concern for BP meds & maybe she doesn't need tight control anymore & we discussed this & decided to continue the same, +diet, exercise, get wt down...     Still under stress w/ 34 y/o mother in [place] w/ $$trouble etc; she sees DrPoulas & psychologist Justine Null who switched her Celexa to [facility] & recently incr to 40mg /d...  ~  December 30, 2010:  95mo ROV & add-on for dizziness> "I feel like sh.." started w/ sinus congestion/pressure "like water in my ears" & neck pain & dizzy;  Heating pad & muscle relaxers helped neck & dizziness improved on it's own;  We discussed Rx w/ Augmentin, Depo/ Dosepak, Nasal saline, etc...  BP controlled on meds;  Still trying to treat lipids w/ diet alone despite yrs of poorly controlled numbers;  Thyroid ok on Synthroid Rx;  Hx DJD, LBP, DDD, HAs, etc;  She is still under the care of psychologist & Lindaann Slough on Prozac40.  ~  January 28, 2011:  Add-on appt requested by the pt for           Problem List:   HYPERSOMNIA (ICD-780.54) & OBSTRUCTIVE SLEEP APNEA (ICD-327.23) - prev on CPAP 9cmH2O pressure and Nuvigil 150mg  Prn per DrYoung, but she stopped on her own in 2011 (states no better on the Rx)...  Although she is chr fatigued, she feels that she rests OK & didn't notice any improvement while on the CPAP in past... ~  Sleep Study 4/09 showed AHI= 1/hr, RDI= 21/hr worse supine, +snoring & desat to  85%... ~  Sleep consult DrYoung w/ rec for CPAP- this controlled her OSA but the hypersomnia persisted & trial Nuvigil Rx... ~  12/11:  pt reports that she stopped CPAP & Nuvigil on her own- citing no improvement on therapy & she is encouraged to f/u w/ DrYoung...  ALLERGIC RHINITIS (ICD-477.9) - uses OTC Allegra, Mucinex, etc...  HYPERTENSION (ICD-401.9) - on ASA 81mg /d, AMLODIPINE 10mg /d,  DIOVAN 160mg /d... Prev intol Atenolol w/ fatigue & insomnia (but not much better off the med). ~  CXR 6/10 showed borderline Cor, clear lungs, WNL.[name]. ~  8/12:  BP= 124/82 & similar at home; denies HA, fatigue, visual changes, CP, palipit, dizziness, syncope, dyspnea, edema, etc; states she's been reading that maybe she doesn't need all this stuff, that it's prob ok if it's higher (asked to show me the data); rec to continue meds! ~  12/12: BP= 126/72 & denies CP, palpit, SOB, edema...  HYPERCHOLESTEROLEMIA (ICD-272.0) - she has not been on diet or meds (she declined to start Simva20 in 2009, prefers diet alone). ~  FLP 12/07 showed TChol 184, TG 77, HDL 42, LDL 126... diet & ex recommended. ~  FLP 1/09 on diet showed TChol 189, TG 60, HDL 40, LDL 137... Simvastatin 20mg /d started. ~  FLP 2/10 on Simva20 showed  TChol 173, TG 57, HDL 39, LDL 122... same med, better diet, get wt down. ~  Now she states that she never really took the Simva20, & just takes FISH OIL supplements. ~  FLP 7/11 on diet alone showed TChol 181, TG 119, HDL 37, LDL 120....she will continue diet. ~  FLP 8/12 on diet alone showed TChol 208, TG 63, HDL 48, LDL 148... The worst yet, rec Simva20, she will decide.  HYPOTHYROIDISM (ICD-244.9) - stable on SYNTHROID - 1/2 tab daily... ~  labs 12/07 showed TSh = 1.77 ~  labs 1/09 showed TSH = 1.47... continue same. ~  labs 2/10 showed TSH= 1.48 ~  labs 7/11 showed TSH= 1.76... Continue same. ~  Labs 8/12 showed TSH = 1.10  HEMATOCHEZIA (ICD-578.1) & HEMORRHOIDS (ICD-455.6) - she had one  episode of BRB in stool/ bowel... mod amt, no pain, no recurrence per pt... she denies D/C etc... her last colonoscopy was 6/03 by [facility] & was normal w/ f/u planned 32yrs... she will call [facility] for f/u. ~  f/u colonoscopy 11/10 by [facility] showed hems, otherw neg...  DEGENERATIVE JOINT DISEASE (ICD-715.90) - she's had shoulder and knee surgery in past... uses OTC meds and had physical therapy rec by DrAlusio who also gave her a shot in R PSIS area for pain... she tells me she has rotator cuff prob & hip pain> both evaluated by DrAlusio & treated w/ OTC anti-inflamm/ Ibuprofen... SPONDYLOSIS, CERVICAL (ICD-721.0) LUMBAR DDD & SPONDYLOSIS > she had epid steroid shots 2/12 & 5/12 w/ min improvement she says... ~  She notes that PT/ exercises/ yoga/ Ibuprofen all seem to help some...  Hx of HEADACHE, MIXED (ICD-784.0)  Hx of DEPRESSION, SITUATIONAL (ICD-309.0) - her sister-in-law passed away, still cares for 70 y/o mother... stress level is high (husb out of work x 51yr, MI 7/10)... she is no longer taking the Effexor, Wellbutrin, Zoloft, or Celexa per psychologist Youlanda Roys who works w/ Lindaann Slough  Now on [facility] 20==>40mg /d...  HEALTH MAINT:  she sees DrDickstein for GYN, [facility]... DrMedoff for GI...  ~  Immunizations:  she gets the yearly Flu vaccine each autumn... PNEUMOVAX & TETANUS 2/10... Shingles vaccine at [facility] 4/10...   Past Surgical History  Procedure Date  . Rotator cuff surgery     Dr. Teressa Senter  . Knee arthroscopy     Dr. Despina Hick  . Tubal ligation     Outpatient Encounter Prescriptions as of 12/30/2010  Medication Sig Dispense Refill  . amLODipine (NORVASC) 10 MG tablet Take 1 tablet (10 mg total) by mouth daily.  30 tablet  11  . aspirin 81 MG tablet Take 81 mg by mouth daily.        . cholecalciferol (VITAMIN D) 1000 UNITS tablet Take 1,000 Units by mouth 2 (two) times daily.        [name] FLUoxetine (PROZAC) 40 MG capsule Take 1 capsule  (40 mg total) by mouth daily. Per Dr. Azucena Fallen  30 capsule  11  . fluticasone (FLONASE) 50 MCG/ACT nasal spray Place 2 sprays into the nose at bedtime.  16 g  12  . guaiFENesin (MUCINEX) 600 MG 12 hr tablet Take 1,200 mg by mouth 2 (two) times daily.        [name] levothyroxine (SYNTHROID, LEVOTHROID) 125 MCG tablet takie 1/2 tablet by mouth once daily       . loratadine (CLARITIN) 10 MG tablet Take 10 mg by mouth daily.        [name]  Multiple Vitamin (MULTIVITAMIN) capsule Take 1 capsule by mouth daily.        . Omega-3 Fatty Acids (FISH OIL) 1200 MG CAPS Take 1 capsule by mouth 2 (two) times daily.        . valsartan (DIOVAN) 160 MG tablet Take 1 tablet (160 mg total) by mouth daily.  90 tablet  3  . amoxicillin-clavulanate (AUGMENTIN) 875-125 MG per tablet Take 1 tablet by mouth 2 (two) times daily.  20 tablet  0  . methocarbamol (ROBAXIN) 500 MG tablet Take 1 tablet (500 mg total) by mouth 3 (three) times daily as needed (muscle spasms).  50 tablet  0  . predniSONE (STERAPRED UNI-PAK) 5 MG TABS Take as directed  1 each  0  . methylPREDNISolone acetate (DEPO-MEDROL) injection 80 mg         Allergies  Allergen Reactions  . Ramipril     REACTION: pt states INTOLERANT to Altace  . Sulfonamide Derivatives     REACTION: hypotension    Current Medications, Allergies, Past Medical History, Past Surgical History, Family History, and Social History were reviewed in [employer] record.   Review of Systems        The patient complains of dyspnea on exertion, joint pain, stiffness, arthritis, depression, and anxiety.  The patient denies fever, chills, sweats, anorexia, fatigue, weakness, malaise, weight loss, sleep disorder, blurring, diplopia, eye irritation, eye discharge, vision loss, eye pain, photophobia, earache, ear discharge, tinnitus, decreased hearing, nasal congestion, nosebleeds, sore throat, hoarseness, chest pain, palpitations, syncope, orthopnea, PND, peripheral edema,  cough, dyspnea at rest, excessive sputum, hemoptysis, wheezing, pleurisy, nausea, vomiting, diarrhea, constipation, change in bowel habits, abdominal pain, melena, hematochezia, jaundice, gas/bloating, indigestion/heartburn, dysphagia, odynophagia, dysuria, hematuria, urinary frequency, urinary hesitancy, nocturia, incontinence, back pain, joint swelling, muscle cramps, muscle weakness, sciatica, restless legs, leg pain at night, leg pain with exertion, rash, itching, dryness, suspicious lesions, paralysis, paresthesias, seizures, tremors, vertigo, transient blindness, frequent falls, frequent headaches, difficulty walking, memory loss, confusion, cold intolerance, heat intolerance, polydipsia, polyphagia, polyuria, unusual weight change, abnormal bruising, bleeding, enlarged lymph nodes, urticaria, allergic rash, hay fever, and recurrent infections.     Objective:   Physical Exam     WD, Overweight, 71 y/o WF in NAD... GENERAL:  Alert & oriented; pleasant & cooperative... HEENT:  [place]/AT, EOM-wnl, PERRLA, EACs-clear, TMs-wnl, NOSE- sl red, THROAT-clear & wnl. NECK:  Supple w/ fairROM; no JVD; normal carotid impulses w/o bruits; no thyromegaly or nodules palpated; no lymphadenopathy. CHEST:  Clear to P & A; without wheezes/ rales/ or rhonchi... no trigger points. HEART:  Regular Rhythm; without murmurs/ rubs/ or gallops. ABDOMEN:  Soft & nontender; normal bowel sounds; no organomegaly or masses detected. EXT: without deformities, mild arthritic changes; no varicose veins/ venous insuffic/ or edema. NEURO:  CN's intact; motor testing normal; sensory testing normal; gait normal & balance OK. DERM:  No lesions noted; no rash etc...  RADIOLOGY DATA:  Reviewed in the EPIC EMR & discussed w/ the patient...  LABORATORY DATA:  Reviewed in the EPIC EMR & discussed w/ the patient...   Assessment & Plan:   URI/ Bronchitis>  We discussed Rx for her URI w/ Augmewntin, Depo/ Pred, etc...   OSA>  She  stopped CPAP & Nuvigil rx from DrYoung stating no better on therapy; encouraged pt to f/u w/ DrYoung, Sleep Med for reassessment.  HBP>  Controlled on Amlodipine & Diovan; pt is rec to continue same despite her readings that maybe you don't need tight  control of BP anymore (asked to show me the info she is referring to)...  CHOL>  FLP now represents the worst numbers in several yrs w/ LDL 148; advised low chol, low fat diet & rec to start Simvastatin 20mg .d- she will decide & let me know.  HYPOTHYROID>  Stable on Synthroid - 1/2 tab daily; continue same...  GI> Hems>  Followed by [facility] w/ colonoscopy 11/10 OK x hems...  DJD, DDD, Cerv&Lumbar Spondylosis etc>  followed by DrAlusio as above...  Depression>  Followed & treated by her psychologist Youlanda Roys & DrPoulas for meds, now on Prozac 40mg /d...   Patient's Medications  New Prescriptions   No medications on file  Previous Medications   AMLODIPINE (NORVASC) 10 MG TABLET    Take 1 tablet (10 mg total) by mouth daily.   ASPIRIN 81 MG TABLET    Take 81 mg by mouth daily.     CHOLECALCIFEROL (VITAMIN D) 1000 UNITS TABLET    Take 1,000 Units by mouth 2 (two) times daily.     FLUOXETINE (PROZAC) 40 MG CAPSULE    Take 1 capsule (40 mg total) by mouth daily. Per Dr. Azucena Fallen   FLUTICASONE [facility]) 50 MCG/ACT NASAL SPRAY    Place 2 sprays into the nose at bedtime.   GUAIFENESIN (MUCINEX) 600 MG 12 HR TABLET    Take 1,200 mg by mouth 2 (two) times daily.     LEVOTHYROXINE (SYNTHROID, LEVOTHROID) 125 MCG TABLET    takie 1/2 tablet by mouth once daily    LORATADINE (CLARITIN) 10 MG TABLET    Take 10 mg by mouth daily.     MULTIPLE VITAMIN (MULTIVITAMIN) CAPSULE    Take 1 capsule by mouth daily.     OMEGA-3 FATTY ACIDS (FISH OIL) 1200 MG CAPS    Take 1 capsule by mouth 2 (two) times daily.     PREDNISONE (STERAPRED UNI-PAK) 5 MG TABS    Take as directed   VALSARTAN (DIOVAN) 160 MG TABLET    Take 1 tablet (160 mg total) by mouth daily.    Modified Medications   No medications on file  Discontinued Medications   No medications on file

## 2011-01-28 NOTE — Progress Notes (Signed)
01/28/11- 61 yoF former smoker with a history of chronic tiredness, asking my evaluation. She has followed with Dr. [name] here for primary care and has been managed for hypothyroidism, depression, and other medical problems as reviewed. She is self-referred today for my evaluating from of sleep medicine standpoint. She had a NPSG in 2009 for complaint of chronic fatigue, which showed "borderline". She tried CPAP for about a year but did not find it helpful. She has continued to frequent waking after sleep onset with quick return to sleep. She does not recognize why she wakes. During the day she has a sense of exhaustion which is not always sleepiness. She is not falling asleep if she sits quietly and feels comfortable if she is kept busy. She had tried [place] which worked quite well initially then seemed to keep her awake if she took it more than 2 or 3 days and aerobic. 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 Very sensitive to caffeine and minimizes how much she uses. She tried sleeping pills but they left her groggy in the daytime. Has had tonsillectomy.

## 2011-01-30 NOTE — Assessment & Plan Note (Signed)
I am not impressed that she has clinically significant sleep apnea. Consider multiple sleep latency test for objective confirmation of daytime sleepiness versus nonspecific "tiredness". We will try Nuvigil again as discussed. Will use Sonata to help consolidate nighttime sleep. Sleep hygiene was discussed.

## 2011-01-30 NOTE — Assessment & Plan Note (Addendum)
She failed to respond to a CPAP trial in 2009. Weight has been very up and down about 10 pounds since that time. I think we may do better to address the sleep fragmentation described by her repeated waking after sleep onset. We will also try helping complaint of daytime sleepiness with another trial of small doses of Nuvigil. I suggested she learn to skip days frequently to avoid building tolerance.

## 2011-03-29 ENCOUNTER — Other Ambulatory Visit: Payer: Self-pay | Admitting: Obstetrics and Gynecology

## 2011-03-29 DIAGNOSIS — Z1231 Encounter for screening mammogram for malignant neoplasm of breast: Secondary | ICD-10-CM

## 2011-04-08 ENCOUNTER — Ambulatory Visit
Admission: RE | Admit: 2011-04-08 | Discharge: 2011-04-08 | Disposition: A | Discharge status: Home / Self Care | Payer: PRIVATE HEALTH INSURANCE | Source: Ambulatory Visit | Attending: Obstetrics and Gynecology | Admitting: Obstetrics and Gynecology

## 2011-04-08 DIAGNOSIS — Z1231 Encounter for screening mammogram for malignant neoplasm of breast: Secondary | ICD-10-CM

## 2011-05-04 ENCOUNTER — Other Ambulatory Visit: Payer: Self-pay | Admitting: *Deleted

## 2011-05-04 MED ORDER — LEVOTHYROXINE SODIUM 125 MCG PO TABS: ORAL_TABLET | ORAL | Status: DC

## 2011-06-14 ENCOUNTER — Other Ambulatory Visit: Payer: Self-pay | Admitting: Pulmonary Disease

## 2011-09-09 ENCOUNTER — Ambulatory Visit (INDEPENDENT_AMBULATORY_CARE_PROVIDER_SITE_OTHER): Payer: PRIVATE HEALTH INSURANCE | Admitting: Internal Medicine

## 2011-09-09 ENCOUNTER — Encounter: Payer: Self-pay | Admitting: Internal Medicine

## 2011-09-09 ENCOUNTER — Other Ambulatory Visit (INDEPENDENT_AMBULATORY_CARE_PROVIDER_SITE_OTHER): Payer: PRIVATE HEALTH INSURANCE

## 2011-09-09 VITALS — BP 138/78 | HR 54 | Temp 97.6°F | Ht 60.0 in | Wt 151.4 lb

## 2011-09-09 DIAGNOSIS — G4733 Obstructive sleep apnea (adult) (pediatric): Secondary | ICD-10-CM

## 2011-09-09 DIAGNOSIS — J309 Allergic rhinitis, unspecified: Secondary | ICD-10-CM

## 2011-09-09 DIAGNOSIS — R5381 Other malaise: Secondary | ICD-10-CM

## 2011-09-09 DIAGNOSIS — R5383 Other fatigue: Secondary | ICD-10-CM

## 2011-09-09 DIAGNOSIS — E7800 Pure hypercholesterolemia, unspecified: Secondary | ICD-10-CM

## 2011-09-09 DIAGNOSIS — I10 Essential (primary) hypertension: Secondary | ICD-10-CM

## 2011-09-09 DIAGNOSIS — E039 Hypothyroidism, unspecified: Secondary | ICD-10-CM

## 2011-09-09 DIAGNOSIS — F4321 Adjustment disorder with depressed mood: Secondary | ICD-10-CM

## 2011-09-09 DIAGNOSIS — Z0000 Encounter for general adult medical examination without abnormal findings: Secondary | ICD-10-CM

## 2011-09-09 LAB — HEPATIC FUNCTION PANEL
Albumin: 4.5 g/dL (ref 3.5–5.2)
Bilirubin, Direct: 0.1 mg/dL (ref 0.0–0.3)
Total Protein: 7.9 g/dL (ref 6.0–8.3)

## 2011-09-09 LAB — BASIC METABOLIC PANEL
BUN: 13 mg/dL (ref 6–23)
Chloride: 99 mEq/L (ref 96–112)
Potassium: 3.5 mEq/L (ref 3.5–5.1)

## 2011-09-09 LAB — CBC WITH DIFFERENTIAL/PLATELET
Basophils Relative: 0.2 % (ref 0.0–3.0)
Eosinophils Relative: 1.9 % (ref 0.0–5.0)
HCT: 41.3 % (ref 36.0–46.0)
Lymphs Abs: 1.5 10*3/uL (ref 0.7–4.0)
MCV: 89.5 fl (ref 78.0–100.0)
Monocytes Absolute: 0.5 10*3/uL (ref 0.1–1.0)
RBC: 4.62 Mil/uL (ref 3.87–5.11)
WBC: 6.1 10*3/uL (ref 4.5–10.5)

## 2011-09-09 LAB — LIPID PANEL
Cholesterol: 196 mg/dL (ref 0–200)
LDL Cholesterol: 135 mg/dL — ABNORMAL HIGH (ref 0–99)
Total CHOL/HDL Ratio: 4
VLDL: 13.4 mg/dL (ref 0.0–40.0)

## 2011-09-09 MED ORDER — FLUTICASONE PROPIONATE 50 MCG/ACT NA SUSP: 2.0000 | Freq: Every day | NASAL | Status: DC

## 2011-09-09 MED ORDER — CLONAZEPAM 0.5 MG PO TABS: 0.5000 mg | ORAL_TABLET | Freq: Two times a day (BID) | ORAL | Status: DC | PRN

## 2011-09-09 MED ORDER — ARMODAFINIL 150 MG PO TABS: 75.0000 mg | ORAL_TABLET | Freq: Every day | ORAL | Status: DC

## 2011-09-09 NOTE — Assessment & Plan Note (Signed)
Uses saline irrigation and daily OTC antihistamine Resume nasal steroid - reviewed indication of meds

## 2011-09-09 NOTE — Assessment & Plan Note (Signed)
On synthroid Check TSH and adjust as needed Overlap symptoms with chronic fatigue Lab Results  Component Value Date   TSH 1.10 09/01/2010

## 2011-09-09 NOTE — Patient Instructions (Signed)
It was good to see you today. We have reviewed your prior records including labs and tests today Health Maintenance reviewed - all recommended immunizations and age-appropriate screenings are up-to-date. Test(s) ordered today. Your results will be called to you after review (48-72hours after test completion). If any changes need to be made, you will be notified at that time. Medications reviewed, no changes at this time. Use clonazepam as needed for anxiety/panic symptoms  Refill on medication(s) as discussed today. Please schedule followup in 12 months, call sooner if problems.

## 2011-09-09 NOTE — Assessment & Plan Note (Signed)
On small dose Nuvigil for same - uncertain benefit Did not respond to CPAP trial 2009 Works with sleep prn - support offered

## 2011-09-09 NOTE — Assessment & Plan Note (Addendum)
Previously rx'd simva 8/12 but declines med tx Stopped RYR 05/2011 due to myalgias Recheck lipids and reconsider med tx as needed The patient is asked to make an attempt to improve diet and exercise patterns to aid in medical management of this problem.

## 2011-09-09 NOTE — Assessment & Plan Note (Signed)
BP Readings from Last 3 Encounters:  09/09/11 138/78  01/28/11 122/78  12/30/10 126/72   The current medical regimen is effective;  continue present plan and medications.

## 2011-09-09 NOTE — Progress Notes (Signed)
Subjective:    Patient ID: Theresa Mejia, female    DOB: 08-25-1940, 71 y.o.   MRN: [id number]  HPI  New pt to me, previously followed with Theresa Mejia - here to establish with new PCP patient is here today for annual physical. Patient feels well overall.  Also reviewed chronic medical issues: hypertension - the patient reports compliance with medication(s) as prescribed. Denies adverse side effects.  Dyslipidemia - previously rx'd simvastatin 05/2010 - stopped due to side effects and declines alt med tx - took OTC RYR until 05/2011,then stopped same due to myalgias and good lipid control -   Hypothyroid with hx thyroid nodule - take thyroid replacement for mgmt of same - the patient reports compliance with medication(s) as prescribed. Denies adverse side effects.  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 Hypersomnia   . Allergic rhinitis   . Hypertension   . Hypercholesteremia   . Hypothyroidism   . Hemorrhoids   . DJD (degenerative joint disease)   . Spondylosis, cervical   . Headache   . Reactive depression (situational)   . Lumbar disc disease   . OBSTRUCTIVE SLEEP APNEA     NPSG 04/29/07- AHI 1.1/hr, RDI 21.2/hr. Weight 176 lbs. CPAP was tried based on the RDI.    [name] OVERACTIVE BLADDER   . Depression    Family History  Problem Relation Age of Onset  . Macular degeneration Mother   . Cancer Mother 28    bladder  . Crohn's disease Father 28  . Hypothyroidism Mother    History  Substance Use Topics  . Smoking status: Former Smoker    Quit date: 01/11/1968  . Smokeless tobacco: Not on file   Comment: married, lives with spouse - primary caregiver for 97yo mother  . Alcohol Use: No     Review of Systems Constitutional: Negative for fever or weight change.  Respiratory: Negative for cough  and shortness of breath.   Cardiovascular: Negative for chest pain or palpitations.  Gastrointestinal: Negative for abdominal pain, no bowel changes.  Musculoskeletal: Negative for gait problem or joint swelling.  Skin: Negative for rash.  Neurological: Negative for dizziness or headache.  No other specific complaints in a complete review of systems (except as listed in HPI above).     Objective:   Physical Exam BP 138/78  Pulse 54  Temp 97.6 F (36.4 C) (Oral)  Ht 5' (1.524 m)  Wt 151 lb 6.4 oz (68.675 kg)  BMI 29.57 kg/m2  SpO2 95% Wt Readings from Last 3 Encounters:  09/09/11 151 lb 6.4 oz (68.675 kg)  01/28/11 162 lb 6.4 oz (73.664 kg)  12/30/10 161 lb (73.029 kg)   Constitutional: She appears well-developed and well-nourished. No distress.  HENT: Head: Normocephalic and atraumatic. Ears: B TMs ok, no erythema or effusion; Nose: Nose normal.  Mouth/Throat: Oropharynx is clear and moist. No oropharyngeal exudate.  Eyes: Conjunctivae and EOM are normal. Pupils are equal, round, and reactive to light. No scleral icterus.  Neck: Normal range of motion. Neck supple. No JVD present. No thyromegaly present.  Cardiovascular: Normal rate, regular rhythm and normal heart sounds.  No murmur heard. No BLE edema. Pulmonary/Chest: Effort normal and breath sounds normal. No respiratory distress. She has no wheezes.  Abdominal: Soft. Bowel sounds are normal. She exhibits no distension. There is  no tenderness. no masses Musculoskeletal: Normal range of motion, no joint effusions. No gross deformities Neurological: She is alert and oriented to person, place, and time. No cranial nerve deficit. Coordination normal.  Skin: Skin is warm and dry. No rash noted. No erythema.  Psychiatric: She has a normal mood and affect. Her behavior is normal. Judgment and thought content normal.   Lab Results  Component Value Date   WBC 6.1 09/09/2011   HGB 13.5 09/09/2011   HCT 41.3 09/09/2011   PLT 303.0  09/09/2011   GLUCOSE 80 09/09/2011   CHOL 196 09/09/2011   TRIG 67.0 09/09/2011   HDL 47.60 09/09/2011   LDLDIRECT 148.3 09/01/2010   LDLCALC 135* 09/09/2011   ALT 17 09/09/2011   AST 24 09/09/2011   NA 135 09/09/2011   K 3.5 09/09/2011   CL 99 09/09/2011   CREATININE 0.6 09/09/2011   BUN 13 09/09/2011   CO2 28 09/09/2011   TSH 0.98 09/09/2011       Assessment & Plan:  CPX/v70.0 - Patient has been counseled on age-appropriate routine health concerns for screening and prevention. These are reviewed and up-to-date. Immunizations are up-to-date or declined. Labs ordered and reviewed. Also see problem list. Medications and labs reviewed today.   Fatigue - chronic and multifactorial - check screening labs and see below - pt is asked to change dosing of OTC antihistamine to night and/or try alternate allergy med such as claritin - support offered

## 2011-09-10 NOTE — Assessment & Plan Note (Signed)
Precipitated by stress of ongoing care for elderly mother Periodic counseling and med tx for same reviewed - Provided clonazepam to use prn anxiety symptoms and to consider alt SSRI/SNRI if increasing or persisting symptoms despite clonazepam prn - support offered

## 2011-11-14 ENCOUNTER — Other Ambulatory Visit: Payer: Self-pay

## 2011-11-14 MED ORDER — VALSARTAN 160 MG PO TABS: 160.0000 mg | ORAL_TABLET | Freq: Every day | ORAL | Status: DC

## 2011-11-16 ENCOUNTER — Telehealth: Payer: Self-pay | Admitting: Internal Medicine

## 2011-11-16 MED ORDER — VALSARTAN 160 MG PO TABS: 160.0000 mg | ORAL_TABLET | Freq: Every day | ORAL | Status: DC

## 2011-11-16 NOTE — Telephone Encounter (Signed)
Notified pt fax over diovan rx...lmb

## 2011-11-16 NOTE — Telephone Encounter (Signed)
Caller: [name]/Patient; Patient Name: Theresa Mejia; PCP: Rene Paci (Adults only); Best Callback Phone Number: [phone]; Call regarding: Medication Refill; first OV with Dr.Leschber was 09/09/11; was given a new rx of all daily meds; said that Diovan 160mg  is usually filled through [facility] for three months, but it has not been filled; med will run out next week and has no more refills; please send rx for the Diovan

## 2012-01-02 ENCOUNTER — Other Ambulatory Visit: Payer: Self-pay | Admitting: *Deleted

## 2012-01-02 ENCOUNTER — Other Ambulatory Visit: Payer: Self-pay | Admitting: Pulmonary Disease

## 2012-01-02 MED ORDER — FLUOXETINE HCL 40 MG PO CAPS: 40.0000 mg | ORAL_CAPSULE | Freq: Every day | ORAL | Status: DC

## 2012-01-02 MED ORDER — AMLODIPINE BESYLATE 10 MG PO TABS: 10.0000 mg | ORAL_TABLET | Freq: Every day | ORAL | Status: DC

## 2012-01-25 ENCOUNTER — Ambulatory Visit (INDEPENDENT_AMBULATORY_CARE_PROVIDER_SITE_OTHER): Payer: PRIVATE HEALTH INSURANCE | Admitting: Internal Medicine

## 2012-01-25 ENCOUNTER — Encounter: Payer: Self-pay | Admitting: Internal Medicine

## 2012-01-25 VITALS — BP 122/82 | HR 62 | Temp 98.3°F | Wt 148.1 lb

## 2012-01-25 DIAGNOSIS — R9431 Abnormal electrocardiogram [ECG] [EKG]: Secondary | ICD-10-CM

## 2012-01-25 DIAGNOSIS — R0789 Other chest pain: Secondary | ICD-10-CM

## 2012-01-25 DIAGNOSIS — F411 Generalized anxiety disorder: Secondary | ICD-10-CM

## 2012-01-25 DIAGNOSIS — I10 Essential (primary) hypertension: Secondary | ICD-10-CM

## 2012-01-25 DIAGNOSIS — F418 Other specified anxiety disorders: Secondary | ICD-10-CM

## 2012-01-25 DIAGNOSIS — R002 Palpitations: Secondary | ICD-10-CM

## 2012-01-25 MED ORDER — LEVOTHYROXINE SODIUM 125 MCG PO TABS: 125.0000 ug | ORAL_TABLET | Freq: Every day | ORAL | Status: DC

## 2012-01-25 MED ORDER — ARMODAFINIL 150 MG PO TABS: 150.0000 mg | ORAL_TABLET | Freq: Every day | ORAL | Status: DC

## 2012-01-25 MED ORDER — CLONAZEPAM 0.5 MG PO TABS: 0.5000 mg | ORAL_TABLET | Freq: Two times a day (BID) | ORAL | Status: DC | PRN

## 2012-01-25 NOTE — Assessment & Plan Note (Signed)
BP Readings from Last 3 Encounters:  01/25/12 122/82  09/09/11 138/78  01/28/11 122/78   The current medical regimen is effective;  continue present plan and medications.

## 2012-01-25 NOTE — Progress Notes (Signed)
  Subjective:    Patient ID: Theresa Mejia, female    DOB: 06/02/1940, 72 y.o.   MRN: [id number]  HPI   Here for atypical chest pain and "palpitations" Ongoing >6 weeks, 3-4x/week No radiation No nausea and vomiting or bowel change    Also reviewed chronic medical issues: hypertension - the patient reports compliance with medication(s) as prescribed. Denies adverse side effects.  Dyslipidemia - previously rx'd simvastatin 05/2010 - stopped due to side effects and declines alt med tx - took OTC RYR until 05/2011,then stopped same due to myalgias and good lipid control -   Hypothyroid with hx thyroid nodule - take thyroid replacement for mgmt of same - the patient reports compliance with medication(s) as prescribed. Denies adverse side effects.  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 Hypersomnia   . Allergic rhinitis   . Hypertension   . Hypercholesteremia   . Hypothyroidism   . Hemorrhoids   . DJD (degenerative joint disease)   . Spondylosis, cervical   . Headache   . Reactive depression (situational)   . Lumbar disc disease   . OBSTRUCTIVE SLEEP APNEA     NPSG 04/29/07- AHI 1.1/hr, RDI 21.2/hr. Weight 176 lbs. CPAP was tried based on the RDI.    [name] OVERACTIVE BLADDER   . Depression     Review of Systems  Constitutional: Negative for fever or weight change.  Respiratory: Negative for cough and shortness of breath.      Objective:   Physical Exam  BP 122/82  Pulse 62  Temp 98.3 F (36.8 C) (Oral)  Wt 148 lb 1.9 oz (67.187 kg)  SpO2 96% Wt Readings from Last 3 Encounters:  01/25/12 148 lb 1.9 oz (67.187 kg)  09/09/11 151 lb 6.4 oz (68.675 kg)  01/28/11 162 lb 6.4 oz (73.664 kg)   Constitutional: She appears well-developed and well-nourished. No distress.  Neck: Normal range of  motion. Neck supple. No JVD present. No thyromegaly present.  Cardiovascular: Normal rate, regular rhythm and normal heart sounds.  No murmur heard. No BLE edema. Pulmonary/Chest: Effort normal and breath sounds normal. No respiratory distress. She has no wheezes.  Psychiatric: She has a normal mood and affect. Her behavior is normal. Judgment and thought content normal.   Lab Results  Component Value Date   WBC 6.1 09/09/2011   HGB 13.5 09/09/2011   HCT 41.3 09/09/2011   PLT 303.0 09/09/2011   GLUCOSE 80 09/09/2011   CHOL 196 09/09/2011   TRIG 67.0 09/09/2011   HDL 47.60 09/09/2011   LDLDIRECT 148.3 09/01/2010   LDLCALC 135* 09/09/2011   ALT 17 09/09/2011   AST 24 09/09/2011   NA 135 09/09/2011   K 3.5 09/09/2011   CL 99 09/09/2011   CREATININE 0.6 09/09/2011   BUN 13 09/09/2011   CO2 28 09/09/2011   TSH 0.98 09/09/2011   ECG: sinus brady 51 - Q antsept unchanged from 02/15/2008    Assessment & Plan:  Palpitations/atypical chest pain - ECG unchanged from 02/2008, but abnormal Refer for stress test Pt declines need for repeat labs  Situational anxiety - related to mom's illness and SNF with CVA - refer to psyc ([facility]) - refill BZ prn - support offered  Also see problem list. Medications and labs reviewed today.

## 2012-01-25 NOTE — Patient Instructions (Signed)
It was good to see you today. We have reviewed your prior records including labs and tests today we'll make referral to Dr. Dellia Cloud . Our office will contact you regarding appointment(s) once made. ECG today unchanged from 02/2008 - will refer for stress test as discussed Medications reviewed and updated, no changes at this time. Refill on medication(s) as discussed today.

## 2012-01-27 ENCOUNTER — Encounter: Payer: Self-pay | Admitting: Cardiology

## 2012-01-30 ENCOUNTER — Telehealth: Payer: Self-pay | Admitting: Internal Medicine

## 2012-01-30 ENCOUNTER — Ambulatory Visit (INDEPENDENT_AMBULATORY_CARE_PROVIDER_SITE_OTHER): Payer: PRIVATE HEALTH INSURANCE | Admitting: Psychology

## 2012-01-30 DIAGNOSIS — F4322 Adjustment disorder with anxiety: Secondary | ICD-10-CM

## 2012-01-30 NOTE — Telephone Encounter (Signed)
Patient says that Synthroid is showing on the medication list she got at her appointment 01/25/12 but she is not currently taking it and would like it to be called in to [facility] on [address] if she is supposed to still be taking this medication

## 2012-01-31 MED ORDER — LEVOTHYROXINE SODIUM 125 MCG PO TABS: 125.0000 ug | ORAL_TABLET | Freq: Every day | ORAL | Status: DC

## 2012-01-31 NOTE — Telephone Encounter (Signed)
Theresa Mejia, She is supposed to be taking it. OK to refill Synthroid 125 mcg tablet po daily #30, 2 refills. Rene Kocher

## 2012-01-31 NOTE — Telephone Encounter (Signed)
Notified pt [facility]...Theresa Mejia

## 2012-02-01 ENCOUNTER — Telehealth: Payer: Self-pay | Admitting: *Deleted

## 2012-02-01 NOTE — Telephone Encounter (Signed)
Notified jackie with md response...lmb

## 2012-02-01 NOTE — Telephone Encounter (Signed)
Want to verify which test md want done. Stress echo or treadmill. She states in md notes she just stated will order stress test.../lmb

## 2012-02-01 NOTE — Telephone Encounter (Signed)
Stress echo as ordered

## 2012-02-02 ENCOUNTER — Other Ambulatory Visit (HOSPITAL_COMMUNITY): Payer: Self-pay | Admitting: Internal Medicine

## 2012-02-02 ENCOUNTER — Ambulatory Visit (HOSPITAL_BASED_OUTPATIENT_CLINIC_OR_DEPARTMENT_OTHER): Payer: PRIVATE HEALTH INSURANCE

## 2012-02-02 ENCOUNTER — Encounter: Payer: Self-pay | Admitting: Internal Medicine

## 2012-02-02 ENCOUNTER — Ambulatory Visit (HOSPITAL_COMMUNITY): Payer: PRIVATE HEALTH INSURANCE | Attending: Cardiology | Admitting: Radiology

## 2012-02-02 DIAGNOSIS — R079 Chest pain, unspecified: Secondary | ICD-10-CM

## 2012-02-02 DIAGNOSIS — R072 Precordial pain: Secondary | ICD-10-CM | POA: Insufficient documentation

## 2012-02-02 DIAGNOSIS — R0789 Other chest pain: Secondary | ICD-10-CM

## 2012-02-02 DIAGNOSIS — R0989 Other specified symptoms and signs involving the circulatory and respiratory systems: Secondary | ICD-10-CM

## 2012-02-02 DIAGNOSIS — R9431 Abnormal electrocardiogram [ECG] [EKG]: Secondary | ICD-10-CM

## 2012-02-02 DIAGNOSIS — R002 Palpitations: Secondary | ICD-10-CM

## 2012-02-02 DIAGNOSIS — I10 Essential (primary) hypertension: Secondary | ICD-10-CM | POA: Insufficient documentation

## 2012-02-09 ENCOUNTER — Ambulatory Visit (INDEPENDENT_AMBULATORY_CARE_PROVIDER_SITE_OTHER): Payer: PRIVATE HEALTH INSURANCE | Admitting: Psychology

## 2012-02-09 DIAGNOSIS — F4322 Adjustment disorder with anxiety: Secondary | ICD-10-CM

## 2012-04-13 ENCOUNTER — Ambulatory Visit (INDEPENDENT_AMBULATORY_CARE_PROVIDER_SITE_OTHER): Payer: PRIVATE HEALTH INSURANCE | Admitting: Psychology

## 2012-04-13 DIAGNOSIS — F4322 Adjustment disorder with anxiety: Secondary | ICD-10-CM

## 2012-04-19 ENCOUNTER — Other Ambulatory Visit: Payer: Self-pay | Admitting: *Deleted

## 2012-04-19 MED ORDER — ARMODAFINIL 150 MG PO TABS: 150.0000 mg | ORAL_TABLET | Freq: Every day | ORAL | Status: DC

## 2012-04-19 NOTE — Telephone Encounter (Signed)
MD out of office. Is this ok to refill.../lmb 

## 2012-04-19 NOTE — Telephone Encounter (Signed)
[facility].../lmb 

## 2012-04-27 ENCOUNTER — Encounter: Payer: Self-pay | Admitting: *Deleted

## 2012-04-27 NOTE — Telephone Encounter (Signed)
A user error has taken place: encounter opened in error, closed for administrative reasons.

## 2012-05-04 ENCOUNTER — Other Ambulatory Visit: Payer: Self-pay

## 2012-05-04 DIAGNOSIS — Z1231 Encounter for screening mammogram for malignant neoplasm of breast: Secondary | ICD-10-CM

## 2012-06-01 ENCOUNTER — Ambulatory Visit
Admission: RE | Admit: 2012-06-01 | Discharge: 2012-06-01 | Disposition: A | Discharge status: Home / Self Care | Payer: PRIVATE HEALTH INSURANCE | Source: Ambulatory Visit

## 2012-06-01 DIAGNOSIS — Z1231 Encounter for screening mammogram for malignant neoplasm of breast: Secondary | ICD-10-CM

## 2012-06-18 ENCOUNTER — Ambulatory Visit (INDEPENDENT_AMBULATORY_CARE_PROVIDER_SITE_OTHER): Payer: PRIVATE HEALTH INSURANCE | Admitting: Psychology

## 2012-06-18 DIAGNOSIS — F4322 Adjustment disorder with anxiety: Secondary | ICD-10-CM

## 2012-07-12 ENCOUNTER — Encounter: Payer: Self-pay | Admitting: Internal Medicine

## 2012-07-12 ENCOUNTER — Ambulatory Visit (INDEPENDENT_AMBULATORY_CARE_PROVIDER_SITE_OTHER): Payer: PRIVATE HEALTH INSURANCE | Admitting: Internal Medicine

## 2012-07-12 VITALS — BP 122/72 | HR 56 | Temp 97.6°F | Wt 146.1 lb

## 2012-07-12 DIAGNOSIS — I10 Essential (primary) hypertension: Secondary | ICD-10-CM

## 2012-07-12 DIAGNOSIS — K529 Noninfective gastroenteritis and colitis, unspecified: Secondary | ICD-10-CM

## 2012-07-12 DIAGNOSIS — R109 Unspecified abdominal pain: Secondary | ICD-10-CM

## 2012-07-12 DIAGNOSIS — K5289 Other specified noninfective gastroenteritis and colitis: Secondary | ICD-10-CM

## 2012-07-12 MED ORDER — HYOSCYAMINE SULFATE ER 0.375 MG PO TB12: 0.3750 mg | ORAL_TABLET | Freq: Two times a day (BID) | ORAL | Status: DC | PRN

## 2012-07-12 MED ORDER — PHILLIPS COLON HEALTH PO CAPS: 1.0000 | ORAL_CAPSULE | ORAL | Status: AC

## 2012-07-12 MED ORDER — METRONIDAZOLE 500 MG PO TABS: 500.0000 mg | ORAL_TABLET | Freq: Three times a day (TID) | ORAL | Status: DC

## 2012-07-12 NOTE — Assessment & Plan Note (Signed)
BP Readings from Last 3 Encounters:  07/12/12 122/72  01/25/12 122/82  09/09/11 138/78   The current medical regimen is effective;  continue present plan and medications.

## 2012-07-12 NOTE — Patient Instructions (Signed)
It was good to see you today. Flagyl antibiotics 3x/day x 1 week -  Probiotic supplement (phillips colon health, Align, etc) daily x 2 weeks, then as needed - samples/coupon given to you today Levbid as needed/if needed for abdominal crampig Your prescription(s) have been submitted to your pharmacy. Please take as directed and contact our office if you believe you are having problem(s) with the medication(s). If symptoms unimproved in next 7-10 days, or if symptoms worse, call for further evaluation and treatment as needed   Colitis Colitis is inflammation of the colon. Colitis can be a short-term or long-standing (chronic) illness. Crohn's disease and ulcerative colitis are 2 types of colitis which are chronic. They usually require lifelong treatment. CAUSES  There are many different causes of colitis, including:  Viruses.  Germs (bacteria).  Medicine reactions. SYMPTOMS   Diarrhea.  Intestinal bleeding.  Pain.  Fever.  Throwing up (vomiting).  Tiredness (fatigue).  Weight loss.  Bowel blockage. DIAGNOSIS  The diagnosis of colitis is based on examination and stool or blood tests. X-rays, CT scan, and colonoscopy may also be needed. TREATMENT  Treatment may include:  Fluids given through the vein (intravenously).  Bowel rest (nothing to eat or drink for a period of time).  Medicine for pain and diarrhea.  Medicines (antibiotics) that kill germs.  Cortisone medicines.  Surgery. HOME CARE INSTRUCTIONS   Get plenty of rest.  Drink enough water and fluids to keep your urine clear or pale yellow.  Eat a well-balanced diet.  Call your caregiver for follow-up as recommended. SEEK IMMEDIATE MEDICAL CARE IF:   You develop chills.  You have an oral temperature above 102 F (38.9 C), not controlled by medicine.  You have extreme weakness, fainting, or dehydration.  You have repeated vomiting.  You develop severe belly (abdominal) pain or are passing bloody  or tarry stools. MAKE SURE YOU:   Understand these instructions.  Will watch your condition.  Will get help right away if you are not doing well or get worse. Document Released: 02/04/2004 Document Revised: 03/21/2011 Document Reviewed: 05/01/2009 [facility] Patient Information 2014 [place], [place].

## 2012-07-12 NOTE — Progress Notes (Signed)
Subjective:    Patient ID: Theresa Mejia, female    DOB: [birth year], 72 y.o.   MRN: [id number]  GI Problem The primary symptoms include fatigue, abdominal pain, nausea and diarrhea. Primary symptoms do not include fever, weight loss, vomiting, melena, hematemesis, jaundice, hematochezia, dysuria, myalgias, arthralgias or rash. The illness began more than 7 days ago. The onset was sudden. The problem has not changed since onset. The abdominal pain is located in the periumbilical region. The abdominal pain does not radiate. The abdominal pain is relieved by nothing.  The diarrhea is semi-solid (darker in color, but no blood or melena). The diarrhea occurs once per day.  The illness is also significant for anorexia and bloating. The illness does not include chills, constipation, tenesmus or back pain. Significant associated medical issues include hemorrhoids. Associated medical issues do not include inflammatory bowel disease, GERD, gallstones, liver disease, PUD, irritable bowel syndrome or diverticulitis. Risk factors for a gastrointestinal illness include travel to endemic areas.    Also reviewed chronic medical issues: hypertension - the patient reports compliance with medication(s) as prescribed. Denies adverse side effects.  Dyslipidemia - previously rx'd simvastatin 05/2010 - stopped due to side effects and declines alt med tx - took OTC RYR until 05/2011,then stopped same due to myalgias and good lipid control -   Hypothyroid with hx thyroid nodule - take thyroid replacement for mgmt of same - the patient reports compliance with medication(s) as prescribed. Denies adverse side effects.   Past Medical History  Diagnosis Date  . Hypersomnia   . Allergic rhinitis   . Hypertension   . Hypercholesteremia   . Hypothyroidism   . DJD (degenerative joint disease)   . Spondylosis, cervical   . Headache(784.0)   . Reactive depression (situational)   . Lumbar disc disease   . OBSTRUCTIVE SLEEP  APNEA     NPSG 04/29/07- AHI 1.1/hr, RDI 21.2/hr. Weight 176 lbs. CPAP was tried based on the RDI.    [name] OVERACTIVE BLADDER   . Depression   . Internal hemorrhoid 11/2008    s/p banding ([place])    Review of Systems  Constitutional: Positive for fatigue. Negative for fever, chills and weight loss.  Gastrointestinal: Positive for nausea, abdominal pain, diarrhea, bloating and anorexia. Negative for vomiting, constipation, melena, hematochezia, hematemesis and jaundice.  Genitourinary: Negative for dysuria.  Musculoskeletal: Negative for myalgias, back pain and arthralgias.  Skin: Negative for rash.      Objective:   Physical Exam  BP 122/72  Pulse 56  Temp(Src) 97.6 F (36.4 C) (Oral)  Wt 146 lb 1.9 oz (66.28 kg)  BMI 28.54 kg/m2  SpO2 96% Wt Readings from Last 3 Encounters:  07/12/12 146 lb 1.9 oz (66.28 kg)  01/25/12 148 lb 1.9 oz (67.187 kg)  09/09/11 151 lb 6.4 oz (68.675 kg)   Constitutional: She appears well-developed and well-nourished. No distress.  Neck: Normal range of motion. Neck supple. No JVD present. No thyromegaly present.  Cardiovascular: Normal rate, regular rhythm and normal heart sounds.  No murmur heard. No BLE edema. Pulmonary/Chest: Effort normal and breath sounds normal. No respiratory distress. She has no wheezes.  Abdomen: SND, mildly tender periumbilical region, no r/g, +BS, hyperactive Psychiatric: She has a normal mood and affect. Her behavior is normal. Judgment and thought content normal.   Lab Results  Component Value Date   WBC 6.1 09/09/2011   HGB 13.5 09/09/2011   HCT 41.3 09/09/2011   PLT 303.0 09/09/2011   GLUCOSE 80 09/09/2011  CHOL 196 09/09/2011   TRIG 67.0 09/09/2011   HDL 47.60 09/09/2011   LDLDIRECT 148.3 09/01/2010   LDLCALC 135* 09/09/2011   ALT 17 09/09/2011   AST 24 09/09/2011   NA 135 09/09/2011   K 3.5 09/09/2011   CL 99 09/09/2011   CREATININE 0.6 09/09/2011   BUN 13 09/09/2011   CO2 28 09/09/2011   TSH 0.98 09/09/2011        Assessment & Plan:   Colitis, nonspecific but symptomatic>1 week since travel overseas  Colo 11/2008 reviewed - no diverticular changes or other issues  Flagyl 500 tid x 1 week Probiotic x 2 weeks levbid prn cramps - Pt to call if unimproved in 1 week, sooner if worse for labs/imaging as needed

## 2012-08-08 ENCOUNTER — Ambulatory Visit (INDEPENDENT_AMBULATORY_CARE_PROVIDER_SITE_OTHER): Payer: PRIVATE HEALTH INSURANCE | Admitting: Psychology

## 2012-08-08 DIAGNOSIS — F4322 Adjustment disorder with anxiety: Secondary | ICD-10-CM

## 2012-08-10 ENCOUNTER — Telehealth: Payer: Self-pay | Admitting: *Deleted

## 2012-08-10 DIAGNOSIS — Z0000 Encounter for general adult medical examination without abnormal findings: Secondary | ICD-10-CM

## 2012-08-10 NOTE — Telephone Encounter (Signed)
Message copied by Deatra James on Fri Aug 10, 2012  4:22 PM ------      Message from: Etheleen Sia      Created: Thu Aug 09, 2012  3:04 PM      Regarding: LABS       PHYSICAL LABS FOR SEPT 24 APPT / NOT A MEDICARE PT ------

## 2012-08-10 NOTE — Telephone Encounter (Signed)
Entered labs...lmb 

## 2012-09-27 ENCOUNTER — Other Ambulatory Visit: Payer: PRIVATE HEALTH INSURANCE

## 2012-09-27 DIAGNOSIS — Z0000 Encounter for general adult medical examination without abnormal findings: Secondary | ICD-10-CM

## 2012-09-27 LAB — URINALYSIS, ROUTINE W REFLEX MICROSCOPIC
Bilirubin Urine: NEGATIVE
Glucose, UA: NEGATIVE mg/dL
Hgb urine dipstick: NEGATIVE
Ketones, ur: NEGATIVE mg/dL
Nitrite: NEGATIVE
Protein, ur: NEGATIVE mg/dL
Specific Gravity, Urine: 1.02 (ref 1.005–1.030)
Urobilinogen, UA: 0.2 mg/dL (ref 0.0–1.0)
pH: 6 (ref 5.0–8.0)

## 2012-09-27 LAB — CBC WITH DIFFERENTIAL/PLATELET
Eosinophils Relative: 3 % (ref 0–5)
Hemoglobin: 13.1 g/dL (ref 12.0–15.0)
Lymphocytes Relative: 38 % (ref 12–46)
Lymphs Abs: 1.4 10*3/uL (ref 0.7–4.0)
MCV: 87.8 fL (ref 78.0–100.0)
Monocytes Relative: 10 % (ref 3–12)
Neutrophils Relative %: 48 % (ref 43–77)
Platelets: 298 10*3/uL (ref 150–400)
RBC: 4.43 MIL/uL (ref 3.87–5.11)
WBC: 3.8 10*3/uL — ABNORMAL LOW (ref 4.0–10.5)

## 2012-09-28 LAB — BASIC METABOLIC PANEL
BUN: 17 mg/dL (ref 6–23)
Chloride: 99 mEq/L (ref 96–112)
Potassium: 3.6 mEq/L (ref 3.5–5.3)
Sodium: 138 mEq/L (ref 135–145)

## 2012-09-28 LAB — LIPID PANEL
Cholesterol: 187 mg/dL (ref 0–200)
LDL Cholesterol: 125 mg/dL — ABNORMAL HIGH (ref 0–99)
Total CHOL/HDL Ratio: 4.1 Ratio
VLDL: 16 mg/dL (ref 0–40)

## 2012-09-28 LAB — HEPATIC FUNCTION PANEL
Albumin: 4.3 g/dL (ref 3.5–5.2)
Total Protein: 7.4 g/dL (ref 6.0–8.3)

## 2012-09-28 LAB — URINALYSIS, MICROSCOPIC ONLY: Squamous Epithelial / LPF: NONE SEEN

## 2012-09-28 LAB — TSH: TSH: 2.039 u[IU]/mL (ref 0.350–4.500)

## 2012-10-02 ENCOUNTER — Other Ambulatory Visit: Payer: Self-pay | Admitting: Internal Medicine

## 2012-10-03 ENCOUNTER — Encounter: Payer: PRIVATE HEALTH INSURANCE | Admitting: Internal Medicine

## 2012-10-08 ENCOUNTER — Other Ambulatory Visit: Payer: Self-pay | Admitting: *Deleted

## 2012-10-08 MED ORDER — FLUTICASONE PROPIONATE 50 MCG/ACT NA SUSP: 2.0000 | Freq: Every day | NASAL | Status: DC

## 2012-10-15 ENCOUNTER — Other Ambulatory Visit (INDEPENDENT_AMBULATORY_CARE_PROVIDER_SITE_OTHER): Payer: PRIVATE HEALTH INSURANCE

## 2012-10-15 ENCOUNTER — Ambulatory Visit (INDEPENDENT_AMBULATORY_CARE_PROVIDER_SITE_OTHER): Payer: PRIVATE HEALTH INSURANCE | Admitting: Internal Medicine

## 2012-10-15 ENCOUNTER — Encounter: Payer: Self-pay | Admitting: Internal Medicine

## 2012-10-15 VITALS — BP 132/84 | HR 68 | Temp 98.4°F | Ht 60.0 in | Wt 150.8 lb

## 2012-10-15 DIAGNOSIS — F4321 Adjustment disorder with depressed mood: Secondary | ICD-10-CM

## 2012-10-15 DIAGNOSIS — E162 Hypoglycemia, unspecified: Secondary | ICD-10-CM

## 2012-10-15 DIAGNOSIS — Z0000 Encounter for general adult medical examination without abnormal findings: Secondary | ICD-10-CM

## 2012-10-15 LAB — BASIC METABOLIC PANEL
BUN: 17 mg/dL (ref 6–23)
CO2: 30 mEq/L (ref 19–32)
Chloride: 102 mEq/L (ref 96–112)
Creatinine, Ser: 0.6 mg/dL (ref 0.4–1.2)
Potassium: 3.7 mEq/L (ref 3.5–5.1)
Sodium: 135 mEq/L (ref 135–145)

## 2012-10-15 LAB — HEMOGLOBIN A1C: Hgb A1c MFr Bld: 5.5 % (ref 4.6–6.5)

## 2012-10-15 MED ORDER — BUPROPION HCL ER (XL) 150 MG PO TB24: 150.0000 mg | ORAL_TABLET | Freq: Every day | ORAL | Status: DC

## 2012-10-15 NOTE — Assessment & Plan Note (Signed)
Precipitated by stress of ongoing care for elderly mother - also life threatening illness of spouse 10/2012 (aortic root dissection) Periodic counseling and med tx for same reviewed - Add Wellbutrin xl 150mg  to ongoing prozac (has been on wellbutrin solo prior to change to prozac remotely) Also use clonazepam prn anxiety symptoms consider alt SSRI/SNRI if increasing or persisting symptoms despite additional Wellbutrin therapy - support offered

## 2012-10-15 NOTE — Patient Instructions (Addendum)
It was good to see you today. We have reviewed your prior records including labs and tests today Health Maintenance reviewed - flu shot this fall -all other recommended immunizations and age-appropriate screenings are up-to-date. Medications reviewed and updated, start Wellbutrin once daily in addition to ongoing medications -no other changes recommended at this time. Test(s) ordered today. Your results will be released to MyChart (or called to you) after review, usually within 72hours after test completion. If any changes need to be made, you will be notified at that same time. Please schedule followup in 3-4 months for recheck depression and medications; call sooner if problems.  Health Maintenance, Females A healthy lifestyle and preventative care can promote health and wellness.  Maintain regular health, dental, and eye exams.  Eat a healthy diet. Foods like vegetables, fruits, whole grains, low-fat dairy products, and lean protein foods contain the nutrients you need without too many calories. Decrease your intake of foods high in solid fats, added sugars, and salt. Get information about a proper diet from your caregiver, if necessary.  Regular physical exercise is one of the most important things you can do for your health. Most adults should get at least 150 minutes of moderate-intensity exercise (any activity that increases your heart rate and causes you to sweat) each week. In addition, most adults need muscle-strengthening exercises on 2 or more days a week.   Maintain a healthy weight. The body mass index (BMI) is a screening tool to identify possible weight problems. It provides an estimate of body fat based on height and weight. Your caregiver can help determine your BMI, and can help you achieve or maintain a healthy weight. For adults 20 years and older:  A BMI below 18.5 is considered underweight.  A BMI of 18.5 to 24.9 is normal.  A BMI of 25 to 29.9 is considered  overweight.  A BMI of 30 and above is considered obese.  Maintain normal blood lipids and cholesterol by exercising and minimizing your intake of saturated fat. Eat a balanced diet with plenty of fruits and vegetables. Blood tests for lipids and cholesterol should begin at age 27 and be repeated every 5 years. If your lipid or cholesterol levels are high, you are over 50, or you are a high risk for heart disease, you may need your cholesterol levels checked more frequently.Ongoing high lipid and cholesterol levels should be treated with medicines if diet and exercise are not effective.  If you smoke, find out from your caregiver how to quit. If you do not use tobacco, do not start.  If you are pregnant, do not drink alcohol. If you are breastfeeding, be very cautious about drinking alcohol. If you are not pregnant and choose to drink alcohol, do not exceed 1 drink per day. One drink is considered to be 12 ounces (355 mL) of beer, 5 ounces (148 mL) of wine, or 1.5 ounces (44 mL) of liquor.  Avoid use of street drugs. Do not share needles with anyone. Ask for help if you need support or instructions about stopping the use of drugs.  High blood pressure causes heart disease and increases the risk of stroke. Blood pressure should be checked at least every 1 to 2 years. Ongoing high blood pressure should be treated with medicines, if weight loss and exercise are not effective.  If you are 57 to 70 years old, ask your caregiver if you should take aspirin to prevent strokes.  Diabetes screening involves taking a blood sample to  check your fasting blood sugar level. This should be done once every 3 years, after age 18, if you are within normal weight and without risk factors for diabetes. Testing should be considered at a younger age or be carried out more frequently if you are overweight and have at least 1 risk factor for diabetes.  Breast cancer screening is essential preventative care for women. You  should practice "breast self-awareness." This means understanding the normal appearance and feel of your breasts and may include breast self-examination. Any changes detected, no matter how small, should be reported to a caregiver. Women in their 56s and 30s should have a clinical breast exam (CBE) by a caregiver as part of a regular health exam every 1 to 3 years. After age 85, women should have a CBE every year. Starting at age 63, women should consider having a mammogram (breast X-ray) every year. Women who have a family history of breast cancer should talk to their caregiver about genetic screening. Women at a high risk of breast cancer should talk to their caregiver about having an MRI and a mammogram every year.  The Pap test is a screening test for cervical cancer. Women should have a Pap test starting at age 79. Between ages 59 and 24, Pap tests should be repeated every 2 years. Beginning at age 47, you should have a Pap test every 3 years as long as the past 3 Pap tests have been normal. If you had a hysterectomy for a problem that was not cancer or a condition that could lead to cancer, then you no longer need Pap tests. If you are between ages 82 and 33, and you have had normal Pap tests going back 10 years, you no longer need Pap tests. If you have had past treatment for cervical cancer or a condition that could lead to cancer, you need Pap tests and screening for cancer for at least 20 years after your treatment. If Pap tests have been discontinued, risk factors (such as a new sexual partner) need to be reassessed to determine if screening should be resumed. Some women have medical problems that increase the chance of getting cervical cancer. In these cases, your caregiver may recommend more frequent screening and Pap tests.  The human papillomavirus (HPV) test is an additional test that may be used for cervical cancer screening. The HPV test looks for the virus that can cause the cell changes on  the cervix. The cells collected during the Pap test can be tested for HPV. The HPV test could be used to screen women aged 26 years and older, and should be used in women of any age who have unclear Pap test results. After the age of 49, women should have HPV testing at the same frequency as a Pap test.  Colorectal cancer can be detected and often prevented. Most routine colorectal cancer screening begins at the age of 5 and continues through age 29. However, your caregiver may recommend screening at an earlier age if you have risk factors for colon cancer. On a yearly basis, your caregiver may provide home test kits to check for hidden blood in the stool. Use of a small camera at the end of a tube, to directly examine the colon (sigmoidoscopy or colonoscopy), can detect the earliest forms of colorectal cancer. Talk to your caregiver about this at age 7, when routine screening begins. Direct examination of the colon should be repeated every 5 to 10 years through age 16, unless early  forms of pre-cancerous polyps or small growths are found.  Hepatitis C blood testing is recommended for all people born from [birth year] through 1963 and any individual with known risks for hepatitis C.  Practice safe sex. Use condoms and avoid high-risk sexual practices to reduce the spread of sexually transmitted infections (STIs). Sexually active women aged 44 and younger should be checked for Chlamydia, which is a common sexually transmitted infection. Older women with new or multiple partners should also be tested for Chlamydia. Testing for other STIs is recommended if you are sexually active and at increased risk.  Osteoporosis is a disease in which the bones lose minerals and strength with aging. This can result in serious bone fractures. The risk of osteoporosis can be identified using a bone density scan. Women ages 31 and over and women at risk for fractures or osteoporosis should discuss screening with their caregivers. Ask  your caregiver whether you should be taking a calcium supplement or vitamin D to reduce the rate of osteoporosis.  Menopause can be associated with physical symptoms and risks. Hormone replacement therapy is available to decrease symptoms and risks. You should talk to your caregiver about whether hormone replacement therapy is right for you.  Use sunscreen with a sun protection factor (SPF) of 30 or greater. Apply sunscreen liberally and repeatedly throughout the day. You should seek shade when your shadow is shorter than you. Protect yourself by wearing long sleeves, pants, a wide-brimmed hat, and sunglasses year round, whenever you are outdoors.  Notify your caregiver of new moles or changes in moles, especially if there is a change in shape or color. Also notify your caregiver if a mole is larger than the size of a pencil eraser.  Stay current with your immunizations. Document Released: 07/12/2010 Document Revised: 03/21/2011 Document Reviewed: 07/12/2010 [facility] Patient Information 2014 [place], [place].

## 2012-10-15 NOTE — Progress Notes (Signed)
Subjective:    Patient ID: Theresa Mejia, female    DOB: 1940/07/06, 72 y.o.   MRN: [id number]  HPI  patient is here today for annual physical. Patient feels well overall.  Also reviewed chronic medical issues: hypertension - the patient reports compliance with medication(s) as prescribed. Denies adverse side effects.  Dyslipidemia - previously prescribed simvastatin 05/2010 - stopped due to side effects and declines alt med tx - took OTC RYR until 05/2011, then stopped same due to myalgias and good lipid control -   Hypothyroid with hx thyroid nodule - take thyroid replacement for mgmt of same - the patient reports compliance with medication(s) as prescribed. Denies adverse side effects.  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 Hypersomnia   . Allergic rhinitis   . Hypertension   . Hypercholesteremia   . Hypothyroidism   . DJD (degenerative joint disease)   . Spondylosis, cervical   . Headache(784.0)   . Reactive depression (situational)   . Lumbar disc disease   . OBSTRUCTIVE SLEEP APNEA     NPSG 04/29/07- AHI 1.1/hr, RDI 21.2/hr. Weight 176 lbs. CPAP was tried based on the RDI.    [name] OVERACTIVE BLADDER   . Depression   . Internal hemorrhoid 11/2008    s/p banding ([facility])   Family History  Problem Relation Age of Onset  . Macular degeneration Mother   . Cancer Mother 39    bladder  . Crohn's disease Father 47  . Hypothyroidism Mother    History  Substance Use Topics  . Smoking status: Former Smoker    Quit date: 01/11/1968  . 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 Alcohol Use: No     Review of Systems Constitutional: Negative for fever or weight change.  Respiratory: Negative for cough and shortness  of breath.   Cardiovascular: Negative for chest pain or palpitations.  Gastrointestinal: Negative for abdominal pain, no bowel changes.  Musculoskeletal: Negative for gait problem or joint swelling.  Skin: Negative for rash.  Neurological: Negative for dizziness or headache.  No other specific complaints in a complete review of systems (except as listed in HPI above).     Objective:   Physical Exam BP 132/84  Pulse 68  Temp(Src) 98.4 F (36.9 C) (Oral)  Ht 5' (1.524 m)  Wt 150 lb 12.8 oz (68.402 kg)  BMI 29.45 kg/m2  SpO2 97% Wt Readings from Last 3 Encounters:  10/15/12 150 lb 12.8 oz (68.402 kg)  07/12/12 146 lb 1.9 oz (66.28 kg)  01/25/12 148 lb 1.9 oz (67.187 kg)   Constitutional: She appears well-developed and well-nourished. No distress.  HENT: Head: Normocephalic and atraumatic. Ears: B TMs ok, no erythema or effusion; Nose: Nose normal.  Mouth/Throat: Oropharynx is clear and moist. No oropharyngeal exudate.  Eyes: Conjunctivae and EOM are normal. Pupils are equal, round, and reactive to light. No scleral icterus.  Neck: Normal range of motion. Neck supple. No JVD present. No thyromegaly present.  Cardiovascular: Normal rate, regular rhythm and normal heart sounds.  No murmur heard. No BLE edema. Pulmonary/Chest: Effort normal and breath sounds normal. No respiratory distress. She has no wheezes.  Abdominal: Soft. Bowel sounds are normal. She exhibits no distension. There is no tenderness. no masses Musculoskeletal: Normal  range of motion, no joint effusions. No gross deformities Neurological: She is alert and oriented to person, place, and time. No cranial nerve deficit. Coordination normal.  Skin: Skin is warm and dry. No rash noted. No erythema.  Psychiatric: She has a normal mood and affect. Her behavior is normal. Judgment and thought content normal.   Lab Results  Component Value Date   WBC 3.8* 09/27/2012   HGB 13.1 09/27/2012   HCT 38.9 09/27/2012   PLT 298  09/27/2012   GLUCOSE <20* 09/27/2012   CHOL 187 09/27/2012   TRIG 81 09/27/2012   HDL 46 09/27/2012   LDLDIRECT 148.3 09/01/2010   LDLCALC 125* 09/27/2012   ALT 16 09/27/2012   AST 20 09/27/2012   NA 138 09/27/2012   K 3.6 09/27/2012   CL 99 09/27/2012   CREATININE 0.69 09/27/2012   BUN 17 09/27/2012   CO2 27 09/27/2012   TSH 2.039 09/27/2012       Assessment & Plan:  CPX/v70.0 - Patient has been counseled on age-appropriate routine health concerns for screening and prevention. These are reviewed and up-to-date. Immunizations are up-to-date or declined. Labs ordered and reviewed.  Hypoglycemia September 2014 on random labs - asymptomatic at time of lab draw. Suspect lab error. Recheck basic metabolic with A1c today to further evaluate. Reassurance provided. Further workup as needed based on repeat labs  Also see problem list. Medications and labs reviewed today.

## 2012-10-29 ENCOUNTER — Encounter: Payer: PRIVATE HEALTH INSURANCE | Admitting: Internal Medicine

## 2012-11-14 ENCOUNTER — Ambulatory Visit (INDEPENDENT_AMBULATORY_CARE_PROVIDER_SITE_OTHER): Payer: PRIVATE HEALTH INSURANCE | Admitting: Nurse Practitioner

## 2012-11-14 ENCOUNTER — Encounter: Payer: Self-pay | Admitting: Nurse Practitioner

## 2012-11-14 ENCOUNTER — Other Ambulatory Visit (INDEPENDENT_AMBULATORY_CARE_PROVIDER_SITE_OTHER): Payer: PRIVATE HEALTH INSURANCE

## 2012-11-14 VITALS — BP 120/80 | HR 65 | Temp 98.8°F | Ht 60.0 in | Wt 147.4 lb

## 2012-11-14 DIAGNOSIS — N390 Urinary tract infection, site not specified: Secondary | ICD-10-CM

## 2012-11-14 LAB — POCT URINALYSIS DIPSTICK
Bilirubin, UA: NEGATIVE
Glucose, UA: NEGATIVE
Ketones, UA: NEGATIVE
Nitrite, UA: POSITIVE

## 2012-11-14 LAB — URINALYSIS, ROUTINE W REFLEX MICROSCOPIC
Bilirubin Urine: NEGATIVE
Ketones, ur: NEGATIVE
Nitrite: POSITIVE
Urobilinogen, UA: 0.2 (ref 0.0–1.0)

## 2012-11-14 MED ORDER — PHENAZOPYRIDINE HCL 200 MG PO TABS: 200.0000 mg | ORAL_TABLET | Freq: Three times a day (TID) | ORAL | Status: DC | PRN

## 2012-11-14 MED ORDER — CIPROFLOXACIN HCL 250 MG PO TABS: 250.0000 mg | ORAL_TABLET | Freq: Two times a day (BID) | ORAL | Status: DC

## 2012-11-14 NOTE — Progress Notes (Signed)
  Subjective:    Patient ID: Theresa Mejia, female    DOB: 1940-04-16, 72 y.o.   MRN: [id number]  Urinary Tract Infection  This is a new problem. The current episode started in the past 7 days (3d). The problem occurs every urination. The problem has been gradually worsening. The quality of the pain is described as aching. The pain is mild. There has been no fever. She is not sexually active. There is no history of pyelonephritis. Associated symptoms include flank pain, frequency and urgency. Pertinent negatives include no chills, hematuria, nausea or vomiting. She has tried nothing for the symptoms. dental surgery w/bone graft 1 week ago      Review of Systems  Constitutional: Negative for fever, chills, activity change, appetite change and fatigue.  HENT: Positive for dental problem (dental surgery 1 week ago, sutures removed today.).   Gastrointestinal: Negative for nausea, vomiting, abdominal pain and diarrhea.  Genitourinary: Positive for dysuria, urgency, frequency and flank pain. Negative for hematuria and vaginal discharge.       Objective:   Physical Exam  Vitals reviewed. Constitutional: She is oriented to person, place, and time. She appears well-developed and well-nourished. No distress.  HENT:  Head: Normocephalic and atraumatic.  Eyes: Conjunctivae are normal. Right eye exhibits no discharge. Left eye exhibits no discharge.  Cardiovascular: Normal rate.   Pulmonary/Chest: Effort normal. No respiratory distress.  Abdominal: Soft. Bowel sounds are normal. She exhibits no distension and no mass. There is no tenderness. There is no rebound and no guarding.  Musculoskeletal: She exhibits tenderness (R CVA tenderness).  Neurological: She is alert and oriented to person, place, and time.  Skin: Skin is warm and dry.  Psychiatric: She has a normal mood and affect. Her behavior is normal. Thought content normal.          Assessment & Plan:  1. Infection of urinary  tract Dysuria, R flank pain, no fever or nausea.  - POCT urinalysis dipstick pos leuks, nites, blood, protein - Urine culture-pending - ciprofloxacin (CIPRO) 250 MG tablet; Take 1 tablet (250 mg total) by mouth 2 (two) times daily.  Dispense: 10 tablet; Refill: 0 - phenazopyridine (PYRIDIUM) 200 MG tablet; Take 1 tablet (200 mg total) by mouth 3 (three) times daily as needed for pain.  Dispense: 6 tablet; Refill: 0 - Urinalysis, Routine w reflex microscopic; Future for f/u of blood & protein

## 2012-11-14 NOTE — Progress Notes (Signed)
Pre-visit discussion using our clinic review tool. No additional management support is needed unless otherwise documented below in the visit note.  

## 2012-11-14 NOTE — Patient Instructions (Signed)
You do have infection of the urinary tract. Please start antibiotic today. Eat yogurt daily to decrease chance of diarrhea-associated w/abx. You may take pyridium for comfort. Please call office if you develop fever, worse back pain, feel poorly. If this occurs over weekend, go to urgent care. You should be feeling better in 3-4 days. Please increase water intake-sip every hour.  Urinary Tract Infection Urinary tract infections (UTIs) can develop anywhere along your urinary tract. Your urinary tract is your body's drainage system for removing wastes and extra water. Your urinary tract includes two kidneys, two ureters, a bladder, and a urethra. Your kidneys are a pair of bean-shaped organs. Each kidney is about the size of your fist. They are located below your ribs, one on each side of your spine. CAUSES Infections are caused by microbes, which are microscopic organisms, including fungi, viruses, and bacteria. These organisms are so small that they can only be seen through a microscope. Bacteria are the microbes that most commonly cause UTIs. SYMPTOMS  Symptoms of UTIs may vary by age and gender of the patient and by the location of the infection. Symptoms in young women typically include a frequent and intense urge to urinate and a painful, burning feeling in the bladder or urethra during urination. Older women and men are more likely to be tired, shaky, and weak and have muscle aches and abdominal pain. A fever may mean the infection is in your kidneys. Other symptoms of a kidney infection include pain in your back or sides below the ribs, nausea, and vomiting. DIAGNOSIS To diagnose a UTI, your caregiver will ask you about your symptoms. Your caregiver also will ask to provide a urine sample. The urine sample will be tested for bacteria and white blood cells. White blood cells are made by your body to help fight infection. TREATMENT  Typically, UTIs can be treated with medication. Because most UTIs are  caused by a bacterial infection, they usually can be treated with the use of antibiotics. The choice of antibiotic and length of treatment depend on your symptoms and the type of bacteria causing your infection. HOME CARE INSTRUCTIONS  If you were prescribed antibiotics, take them exactly as your caregiver instructs you. Finish the medication even if you feel better after you have only taken some of the medication.  Drink enough water and fluids to keep your urine clear or pale yellow.  Avoid caffeine, tea, and carbonated beverages. They tend to irritate your bladder.  Empty your bladder often. Avoid holding urine for long periods of time.  Empty your bladder before and after sexual intercourse.  After a bowel movement, women should cleanse from front to back. Use each tissue only once. SEEK MEDICAL CARE IF:   You have back pain.  You develop a fever.  Your symptoms do not begin to resolve within 3 days. SEEK IMMEDIATE MEDICAL CARE IF:   You have severe back pain or lower abdominal pain.  You develop chills.  You have nausea or vomiting.  You have continued burning or discomfort with urination. MAKE SURE YOU:   Understand these instructions.  Will watch your condition.  Will get help right away if you are not doing well or get worse. Document Released: 10/06/2004 Document Revised: 06/28/2011 Document Reviewed: 02/04/2011 [facility] Patient Information 2014 [place], [place].

## 2012-11-16 ENCOUNTER — Telehealth: Payer: Self-pay | Admitting: Nurse Practitioner

## 2012-11-16 LAB — URINE CULTURE: Colony Count: >=100000

## 2012-11-16 NOTE — Telephone Encounter (Signed)
C&s shows sensitivity to abx prescribed. No changes.

## 2012-12-14 ENCOUNTER — Other Ambulatory Visit: Payer: Self-pay | Admitting: Internal Medicine

## 2013-01-19 ENCOUNTER — Other Ambulatory Visit: Payer: Self-pay | Admitting: Internal Medicine

## 2013-01-21 NOTE — Telephone Encounter (Signed)
Faxed script back to [facility]...Johny Chess

## 2013-02-01 ENCOUNTER — Other Ambulatory Visit: Payer: Self-pay | Admitting: Obstetrics and Gynecology

## 2013-02-01 DIAGNOSIS — Z780 Asymptomatic menopausal state: Secondary | ICD-10-CM

## 2013-02-08 ENCOUNTER — Ambulatory Visit
Admission: RE | Admit: 2013-02-08 | Discharge: 2013-02-08 | Disposition: A | Discharge status: Home / Self Care | Payer: Self-pay | Source: Ambulatory Visit | Attending: Obstetrics and Gynecology | Admitting: Obstetrics and Gynecology

## 2013-02-08 DIAGNOSIS — Z780 Asymptomatic menopausal state: Secondary | ICD-10-CM

## 2013-03-29 ENCOUNTER — Other Ambulatory Visit: Payer: Self-pay | Admitting: Internal Medicine

## 2013-04-03 ENCOUNTER — Other Ambulatory Visit: Payer: Self-pay | Admitting: Internal Medicine

## 2013-04-19 ENCOUNTER — Other Ambulatory Visit: Payer: Self-pay | Admitting: Internal Medicine

## 2013-05-15 ENCOUNTER — Other Ambulatory Visit: Payer: Self-pay | Admitting: *Deleted

## 2013-05-15 MED ORDER — VALSARTAN 160 MG PO TABS: ORAL_TABLET | ORAL | Status: DC

## 2013-05-16 ENCOUNTER — Other Ambulatory Visit: Payer: Self-pay | Admitting: *Deleted

## 2013-05-16 MED ORDER — VALSARTAN 160 MG PO TABS: ORAL_TABLET | ORAL | Status: DC

## 2013-07-01 ENCOUNTER — Other Ambulatory Visit: Payer: Self-pay

## 2013-07-01 DIAGNOSIS — Z1231 Encounter for screening mammogram for malignant neoplasm of breast: Secondary | ICD-10-CM

## 2013-07-11 ENCOUNTER — Ambulatory Visit
Admission: RE | Admit: 2013-07-11 | Discharge: 2013-07-11 | Disposition: A | Discharge status: Home / Self Care | Payer: Medicare Other | Source: Ambulatory Visit

## 2013-07-11 DIAGNOSIS — Z1231 Encounter for screening mammogram for malignant neoplasm of breast: Secondary | ICD-10-CM

## 2013-08-20 ENCOUNTER — Other Ambulatory Visit: Payer: Self-pay | Admitting: Internal Medicine

## 2013-09-02 ENCOUNTER — Telehealth: Payer: Self-pay | Admitting: Internal Medicine

## 2013-09-02 ENCOUNTER — Other Ambulatory Visit (INDEPENDENT_AMBULATORY_CARE_PROVIDER_SITE_OTHER): Payer: Medicare Other

## 2013-09-02 ENCOUNTER — Encounter: Payer: Self-pay | Admitting: Internal Medicine

## 2013-09-02 ENCOUNTER — Ambulatory Visit (INDEPENDENT_AMBULATORY_CARE_PROVIDER_SITE_OTHER): Payer: Medicare Other | Admitting: Internal Medicine

## 2013-09-02 VITALS — BP 144/82 | HR 50 | Temp 98.2°F | Ht 60.0 in | Wt 151.5 lb

## 2013-09-02 DIAGNOSIS — Z0000 Encounter for general adult medical examination without abnormal findings: Secondary | ICD-10-CM

## 2013-09-02 DIAGNOSIS — H9193 Unspecified hearing loss, bilateral: Secondary | ICD-10-CM

## 2013-09-02 DIAGNOSIS — E039 Hypothyroidism, unspecified: Secondary | ICD-10-CM

## 2013-09-02 DIAGNOSIS — E7800 Pure hypercholesterolemia, unspecified: Secondary | ICD-10-CM

## 2013-09-02 DIAGNOSIS — M7070 Other bursitis of hip, unspecified hip: Secondary | ICD-10-CM

## 2013-09-02 DIAGNOSIS — L259 Unspecified contact dermatitis, unspecified cause: Secondary | ICD-10-CM

## 2013-09-02 DIAGNOSIS — M76899 Other specified enthesopathies of unspecified lower limb, excluding foot: Secondary | ICD-10-CM

## 2013-09-02 DIAGNOSIS — I10 Essential (primary) hypertension: Secondary | ICD-10-CM

## 2013-09-02 DIAGNOSIS — H9190 Unspecified hearing loss, unspecified ear: Secondary | ICD-10-CM

## 2013-09-02 LAB — CBC WITH DIFFERENTIAL/PLATELET
Basophils Absolute: 0 10*3/uL (ref 0.0–0.1)
Basophils Relative: 0.7 % (ref 0.0–3.0)
EOS PCT: 2.7 % (ref 0.0–5.0)
Eosinophils Absolute: 0.1 10*3/uL (ref 0.0–0.7)
HEMATOCRIT: 40.4 % (ref 36.0–46.0)
HEMOGLOBIN: 13.5 g/dL (ref 12.0–15.0)
Lymphocytes Relative: 33.8 % (ref 12.0–46.0)
Lymphs Abs: 1.7 10*3/uL (ref 0.7–4.0)
MCHC: 33.3 g/dL (ref 30.0–36.0)
MCV: 89.4 fl (ref 78.0–100.0)
MONOS PCT: 7.9 % (ref 3.0–12.0)
Monocytes Absolute: 0.4 10*3/uL (ref 0.1–1.0)
NEUTROS ABS: 2.8 10*3/uL (ref 1.4–7.7)
Neutrophils Relative %: 54.9 % (ref 43.0–77.0)
Platelets: 318 10*3/uL (ref 150.0–400.0)
RBC: 4.52 Mil/uL (ref 3.87–5.11)
RDW: 13.2 % (ref 11.5–15.5)
WBC: 5 10*3/uL (ref 4.0–10.5)

## 2013-09-02 LAB — HEPATIC FUNCTION PANEL
ALBUMIN: 4.3 g/dL (ref 3.5–5.2)
ALT: 20 U/L (ref 0–35)
AST: 25 U/L (ref 0–37)
Alkaline Phosphatase: 46 U/L (ref 39–117)
BILIRUBIN TOTAL: 0.9 mg/dL (ref 0.2–1.2)
Bilirubin, Direct: 0.1 mg/dL (ref 0.0–0.3)
Total Protein: 7.7 g/dL (ref 6.0–8.3)

## 2013-09-02 LAB — LIPID PANEL
CHOLESTEROL: 199 mg/dL (ref 0–200)
HDL: 40.3 mg/dL (ref >39.00)
LDL CALC: 137 mg/dL — AB (ref 0–99)
NonHDL: 158.7
Total CHOL/HDL Ratio: 5
Triglycerides: 111 mg/dL (ref 0.0–149.0)
VLDL: 22.2 mg/dL (ref 0.0–40.0)

## 2013-09-02 LAB — URINALYSIS, ROUTINE W REFLEX MICROSCOPIC
Bilirubin Urine: NEGATIVE
Hgb urine dipstick: NEGATIVE
Ketones, ur: NEGATIVE
NITRITE: NEGATIVE
PH: 6 (ref 5.0–8.0)
RBC / HPF: NONE SEEN (ref 0–?)
Specific Gravity, Urine: 1.01 (ref 1.000–1.030)
Total Protein, Urine: NEGATIVE
Urine Glucose: NEGATIVE
Urobilinogen, UA: 0.2 (ref 0.0–1.0)

## 2013-09-02 LAB — TSH: TSH: 1.37 u[IU]/mL (ref 0.35–4.50)

## 2013-09-02 LAB — BASIC METABOLIC PANEL
BUN: 15 mg/dL (ref 6–23)
CO2: 27 mEq/L (ref 19–32)
Calcium: 9.1 mg/dL (ref 8.4–10.5)
Chloride: 102 mEq/L (ref 96–112)
Creatinine, Ser: 0.7 mg/dL (ref 0.4–1.2)
GFR: 87.75 mL/min (ref >60.00)
Glucose, Bld: 79 mg/dL (ref 70–99)
POTASSIUM: 4.3 meq/L (ref 3.5–5.1)
SODIUM: 138 meq/L (ref 135–145)

## 2013-09-02 MED ORDER — TRIAMCINOLONE ACETONIDE 0.1 % EX CREA: 1.0000 "application " | TOPICAL_CREAM | Freq: Two times a day (BID) | CUTANEOUS | Status: DC

## 2013-09-02 MED ORDER — NAPROXEN SODIUM 220 MG PO TABS
220.0000 mg | ORAL_TABLET | Freq: Two times a day (BID) | ORAL | Status: DC
Start: 2013-09-02 — End: 2013-11-01

## 2013-09-02 NOTE — Progress Notes (Signed)
Subjective:    Patient ID: Theresa Mejia, female    DOB: 10-31-1940, 73 y.o.   MRN: [id number]  HPI   Here for medicare wellness and annual exam/labs  Diet: heart healthy  Physical activity: exercise 3-4x/wk Depression/mood screen: negative Hearing: reports diminished left greater than right side, subacute decline Visual acuity: grossly normal, performs annual eye exam  ADLs: capable Fall risk: none Home safety: good Cognitive evaluation: intact to orientation, naming, recall and repetition EOL planning: adv directives, full code/ I agree  I have personally reviewed and have noted 1. The patient's medical and social history 2. Their use of alcohol, tobacco or illicit drugs 3. Their current medications and supplements 4. The patient's functional ability including ADL's, fall risks, home safety risks and hearing or visual impairment. 5. Diet and physical activities 6. Evidence for depression or mood disorders  Also reviewed chronic medical issues and interval medical events  Past Medical History  Diagnosis Date  . Hypersomnia   . Allergic rhinitis   . Hypertension   . Hypercholesteremia   . Hypothyroidism   . DJD (degenerative joint disease)   . Spondylosis, cervical   . Headache(784.0)   . Reactive depression (situational)   . Lumbar disc disease   . OBSTRUCTIVE SLEEP APNEA     NPSG 04/29/07- AHI 1.1/hr, RDI 21.2/hr. Weight 176 lbs. CPAP was tried based on the RDI.    [name] OVERACTIVE BLADDER   . Depression   . Internal hemorrhoid 11/2008    s/p banding ([facility])   Family History  Problem Relation Age of Onset  . Macular degeneration Mother   . Cancer Mother 23    bladder  . Crohn's disease Father 81  . Hypothyroidism Mother   . Parkinsonism Maternal Grandmother   . Tremor Mother   . Stroke Mother 31   History  Substance Use Topics  . Smoking status: Former Smoker    Quit date: 01/11/1968  . Smokeless tobacco: Not on file  . Alcohol Use: No    Review of  Systems  Constitutional: Positive for fatigue. Negative for unexpected weight change.  HENT: Positive for hearing loss.   Respiratory: Negative for cough, shortness of breath and wheezing.   Cardiovascular: Negative for chest pain, palpitations and leg swelling.  Gastrointestinal: Negative for nausea, abdominal pain and diarrhea.  Musculoskeletal:       Buttock bursitis pain -per ortho ([place])  Skin: Positive for rash (itch bilateral buttock at swimsuit line x 2 weeks since beach trip). Negative for wound.  Neurological: Positive for tremors. Negative for dizziness, weakness, light-headedness and headaches.  Psychiatric/Behavioral: Negative for dysphoric mood. The patient is not nervous/anxious.   All other systems reviewed and are negative.      Objective:   Physical Exam  BP 144/82  Pulse 50  Temp(Src) 98.2 F (36.8 C) (Oral)  Ht 5' (1.524 m)  Wt 151 lb 8 oz (68.72 kg)  BMI 29.59 kg/m2  SpO2 97% Wt Readings from Last 3 Encounters:  09/02/13 151 lb 8 oz (68.72 kg)  11/14/12 147 lb 6.4 oz (66.86 kg)  10/15/12 150 lb 12.8 oz (68.402 kg)   Constitutional: She appears well-developed and well-nourished. No distress.  HENT: Head: Normocephalic and atraumatic. Ears: B TMs ok, no erythema or effusion; Nose: Nose normal. Mouth/Throat: Oropharynx is clear and moist. No oropharyngeal exudate.  Eyes: Conjunctivae and EOM are normal. Pupils are equal, round, and reactive to light. No scleral icterus.  Neck: Normal range of motion. Neck supple. No  JVD present. No thyromegaly present.  Cardiovascular: Normal rate, regular rhythm and normal heart sounds.  No murmur heard. No BLE edema. Pulmonary/Chest: Effort normal and breath sounds normal. No respiratory distress. She has no wheezes.  Abdominal: Soft. Bowel sounds are normal. She exhibits no distension. There is no tenderness. no masses GU/breast: defer to gyn Musculoskeletal: Normal range of motion, no joint effusions. No gross  deformities Neurological: She is alert and oriented to person, place, and time. No cranial nerve deficit. Coordination, balance, strength, speech and gait are normal.  Skin: contact dermatitis erythema bilateral buttock along a plastic line and lateral side of right breast with linear rash. Remaining skin is warm and dry. No other rash noted. No erythema or ulceration/vesicles.  Psychiatric: She has a normal mood and affect. Her behavior is normal. Judgment and thought content normal.    Lab Results  Component Value Date   WBC 3.8* 09/27/2012   HGB 13.1 09/27/2012   HCT 38.9 09/27/2012   PLT 298 09/27/2012   GLUCOSE 83 10/15/2012   CHOL 187 09/27/2012   TRIG 81 09/27/2012   HDL 46 09/27/2012   LDLDIRECT 148.3 09/01/2010   LDLCALC 125* 09/27/2012   ALT 16 09/27/2012   AST 20 09/27/2012   NA 135 10/15/2012   K 3.7 10/15/2012   CL 102 10/15/2012   CREATININE 0.6 10/15/2012   BUN 17 10/15/2012   CO2 30 10/15/2012   TSH 2.039 09/27/2012   HGBA1C 5.5 10/15/2012    Mm Screening Breast Tomo Bilateral  07/15/2013   CLINICAL DATA:  Screening. Benign excisional biopsy of the left breast in 2011.  EXAM: DIGITAL SCREENING BILATERAL MAMMOGRAM WITH 3D TOMO WITH CAD  COMPARISON:  Previous exam(s).  ACR Breast Density Category b: There are scattered areas of fibroglandular density.  FINDINGS: There are no findings suspicious for malignancy. Expected post surgical scarring in the upper left breast, unchanged. Images were processed with CAD.  IMPRESSION: No mammographic evidence of malignancy. A result letter of this screening mammogram will be mailed directly to the patient.  RECOMMENDATION: Screening mammogram in one year. (Code:[id number]-B-01Y)  BI-RADS CATEGORY  2: Benign.   Electronically Signed   By: Evangeline Dakin M.D.   On: 07/15/2013 15:18       Assessment & Plan:   AWV/CPX/v70.0 - Patient has been counseled on age-appropriate routine health concerns for screening and prevention. These are reviewed and up-to-date.  Immunizations are up-to-date or declined. Labs ordered and reviewed.  Contact dermatitis. Education reassurance provided. Use triamcinolone cream as needed. To call if referral to dermatologist as needed if symptoms do not resolve the next 4 weeks, sooner if worse  Initial bursitis. Diagnoses as per orthopedics. Advised over-the-counter anti-inflammatory twice daily for 710 days and to resume stretches as previously taught by physical therapy. Patient declines the referral physical therapy at this time because of the ductal costs. Patient will call if referral is desired or if pain symptoms unimproved with conservative care  Hearing loss, left greater than right. Refer to audiology  Problem List Items Addressed This Visit   None    Visit Diagnoses   Routine general medical examination at a health care facility    -  Primary    Relevant Orders       Basic metabolic panel       CBC with Differential       Hepatic function panel       Lipid panel       TSH  Urinalysis, Routine w reflex microscopic    Hearing loss, bilateral        Relevant Orders       Ambulatory referral to Audiology    Contact dermatitis        Ischial bursitis, unspecified laterality

## 2013-09-02 NOTE — Patient Instructions (Addendum)
It was good to see you today.  We have reviewed your prior records including labs and tests today  Health Maintenance reviewed - consider your annual flu immunization. All other recommended immunizations and age-appropriate screenings are up-to-date.  Test(s) ordered today. Your results will be released to [place] (or called to you) after review, usually within 72hours after test completion. If any changes need to be made, you will be notified at that same time.  Medications reviewed and updated Use prescription triamcinolone cream for contact dermatitis as needed - Your prescription(s) have been submitted to your pharmacy. Please take as directed and contact our office if you believe you are having problem(s) with the medication(s).  Use Aleve over-the-counter twice daily for 7-10 days to help bursitis in addition to stretches as reviewed. Let me know if referral to physical therapy as needed  we'll make referral to [facility] Ou for Audiology screening and hearing evaluation. Our office will contact you regarding appointment(s) once made.  Please schedule followup in 12 months for annual exam and labs, call sooner if problems.  Health Maintenance Adopting a healthy lifestyle and getting preventive care can go a long way to promote health and wellness. Talk with your health care provider about what schedule of regular examinations is right for you. This is a good chance for you to check in with your provider about disease prevention and staying healthy. In between checkups, there are plenty of things you can do on your own. Experts have done a lot of research about which lifestyle changes and preventive measures are most likely to keep you healthy. Ask your health care provider for more information. WEIGHT AND DIET  Eat a healthy diet  Be sure to include plenty of vegetables, fruits, low-fat dairy products, and lean protein.  Do not eat a lot of foods high in solid fats, added sugars, or  salt.  Get regular exercise. This is one of the most important things you can do for your health.  Most adults should exercise for at least 150 minutes each week. The exercise should increase your heart rate and make you sweat (moderate-intensity exercise).  Most adults should also do strengthening exercises at least twice a week. This is in addition to the moderate-intensity exercise.  Maintain a healthy weight  Body mass index (BMI) is a measurement that can be used to identify possible weight problems. It estimates body fat based on height and weight. Your health care provider can help determine your BMI and help you achieve or maintain a healthy weight.  For females 33 years of age and older:   A BMI below 18.5 is considered underweight.  A BMI of 18.5 to 24.9 is normal.  A BMI of 25 to 29.9 is considered overweight.  A BMI of 30 and above is considered obese.  Watch levels of cholesterol and blood lipids  You should start having your blood tested for lipids and cholesterol at 71 years of age, then have this test every 5 years.  You may need to have your cholesterol levels checked more often if:  Your lipid or cholesterol levels are high.  You are older than 71 years of age.  You are at high risk for heart disease.  CANCER SCREENING   Lung Cancer  Lung cancer screening is recommended for adults 3-62 years old who are at high risk for lung cancer because of a history of smoking.  A yearly low-dose CT scan of the lungs is recommended for people who:  Currently smoke.  Have quit within the past 15 years.  Have at least a 30-pack-year history of smoking. A pack year is smoking an average of one pack of cigarettes a day for 1 year.  Yearly screening should continue until it has been 15 years since you quit.  Yearly screening should stop if you develop a health problem that would prevent you from having lung cancer treatment.  Breast Cancer  Practice breast  self-awareness. This means understanding how your breasts normally appear and feel.  It also means doing regular breast self-exams. Let your health care provider know about any changes, no matter how small.  If you are in your 20s or 30s, you should have a clinical breast exam (CBE) by a health care provider every 1-3 years as part of a regular health exam.  If you are 43 or older, have a CBE every year. Also consider having a breast X-ray (mammogram) every year.  If you have a family history of breast cancer, talk to your health care provider about genetic screening.  If you are at high risk for breast cancer, talk to your health care provider about having an MRI and a mammogram every year.  Breast cancer gene (BRCA) assessment is recommended for women who have family members with BRCA-related cancers. BRCA-related cancers include:  Breast.  Ovarian.  Tubal.  Peritoneal cancers.  Results of the assessment will determine the need for genetic counseling and BRCA1 and BRCA2 testing. Cervical Cancer Routine pelvic examinations to screen for cervical cancer are no longer recommended for nonpregnant women who are considered low risk for cancer of the pelvic organs (ovaries, uterus, and vagina) and who do not have symptoms. A pelvic examination may be necessary if you have symptoms including those associated with pelvic infections. Ask your health care provider if a screening pelvic exam is right for you.   The Pap test is the screening test for cervical cancer for women who are considered at risk.  If you had a hysterectomy for a problem that was not cancer or a condition that could lead to cancer, then you no longer need Pap tests.  If you are older than 65 years, and you have had normal Pap tests for the past 10 years, you no longer need to have Pap tests.  If you have had past treatment for cervical cancer or a condition that could lead to cancer, you need Pap tests and screening for  cancer for at least 20 years after your treatment.  If you no longer get a Pap test, assess your risk factors if they change (such as having a new sexual partner). This can affect whether you should start being screened again.  Some women have medical problems that increase their chance of getting cervical cancer. If this is the case for you, your health care provider may recommend more frequent screening and Pap tests.  The human papillomavirus (HPV) test is another test that may be used for cervical cancer screening. The HPV test looks for the virus that can cause cell changes in the cervix. The cells collected during the Pap test can be tested for HPV.  The HPV test can be used to screen women 68 years of age and older. Getting tested for HPV can extend the interval between normal Pap tests from three to five years.  An HPV test also should be used to screen women of any age who have unclear Pap test results.  After 71 years of age, women  should have HPV testing as often as Pap tests.  Colorectal Cancer  This type of cancer can be detected and often prevented.  Routine colorectal cancer screening usually begins at 71 years of age and continues through 71 years of age.  Your health care provider may recommend screening at an earlier age if you have risk factors for colon cancer.  Your health care provider may also recommend using home test kits to check for hidden blood in the stool.  A small camera at the end of a tube can be used to examine your colon directly (sigmoidoscopy or colonoscopy). This is done to check for the earliest forms of colorectal cancer.  Routine screening usually begins at age 2.  Direct examination of the colon should be repeated every 5-10 years through 72 years of age. However, you may need to be screened more often if early forms of precancerous polyps or small growths are found. Skin Cancer  Check your skin from head to toe regularly.  Tell your health  care provider about any new moles or changes in moles, especially if there is a change in a mole's shape or color.  Also tell your health care provider if you have a mole that is larger than the size of a pencil eraser.  Always use sunscreen. Apply sunscreen liberally and repeatedly throughout the day.  Protect yourself by wearing long sleeves, pants, a wide-brimmed hat, and sunglasses whenever you are outside. HEART DISEASE, DIABETES, AND HIGH BLOOD PRESSURE   Have your blood pressure checked at least every 1-2 years. High blood pressure causes heart disease and increases the risk of stroke.  If you are between 54 years and 32 years old, ask your health care provider if you should take aspirin to prevent strokes.  Have regular diabetes screenings. This involves taking a blood sample to check your fasting blood sugar level.  If you are at a normal weight and have a low risk for diabetes, have this test once every three years after 71 years of age.  If you are overweight and have a high risk for diabetes, consider being tested at a younger age or more often. PREVENTING INFECTION  Hepatitis B  If you have a higher risk for hepatitis B, you should be screened for this virus. You are considered at high risk for hepatitis B if:  You were born in a country where hepatitis B is common. Ask your health care provider which countries are considered high risk.  Your parents were born in a high-risk country, and you have not been immunized against hepatitis B (hepatitis B vaccine).  You have HIV or AIDS.  You use needles to inject street drugs.  You live with someone who has hepatitis B.  You have had sex with someone who has hepatitis B.  You get hemodialysis treatment.  You take certain medicines for conditions, including cancer, organ transplantation, and autoimmune conditions. Hepatitis C  Blood testing is recommended for:  Everyone born from [birth year] through 1963.  Anyone with known  risk factors for hepatitis C. Sexually transmitted infections (STIs)  You should be screened for sexually transmitted infections (STIs) including gonorrhea and chlamydia if:  You are sexually active and are younger than 71 years of age.  You are older than 71 years of age and your health care provider tells you that you are at risk for this type of infection.  Your sexual activity has changed since you were last screened and you are at an increased  risk for chlamydia or gonorrhea. Ask your health care provider if you are at risk.  If you do not have HIV, but are at risk, it may be recommended that you take a prescription medicine daily to prevent HIV infection. This is called pre-exposure prophylaxis (PrEP). You are considered at risk if:  You are sexually active and do not regularly use condoms or know the HIV status of your partner(s).  You take drugs by injection.  You are sexually active with a partner who has HIV. Talk with your health care provider about whether you are at high risk of being infected with HIV. If you choose to begin PrEP, you should first be tested for HIV. You should then be tested every 3 months for as long as you are taking PrEP.  PREGNANCY   If you are premenopausal and you may become pregnant, ask your health care provider about preconception counseling.  If you may become pregnant, take 400 to 800 micrograms (mcg) of folic acid every day.  If you want to prevent pregnancy, talk to your health care provider about birth control (contraception). OSTEOPOROSIS AND MENOPAUSE   Osteoporosis is a disease in which the bones lose minerals and strength with aging. This can result in serious bone fractures. Your risk for osteoporosis can be identified using a bone density scan.  If you are 26 years of age or older, or if you are at risk for osteoporosis and fractures, ask your health care provider if you should be screened.  Ask your health care provider whether you  should take a calcium or vitamin D supplement to lower your risk for osteoporosis.  Menopause may have certain physical symptoms and risks.  Hormone replacement therapy may reduce some of these symptoms and risks. Talk to your health care provider about whether hormone replacement therapy is right for you.  HOME CARE INSTRUCTIONS   Schedule regular health, dental, and eye exams.  Stay current with your immunizations.   Do not use any tobacco products including cigarettes, chewing tobacco, or electronic cigarettes.  If you are pregnant, do not drink alcohol.  If you are breastfeeding, limit how much and how often you drink alcohol.  Limit alcohol intake to no more than 1 drink per day for nonpregnant women. One drink equals 12 ounces of beer, 5 ounces of wine, or 1 ounces of hard liquor.  Do not use street drugs.  Do not share needles.  Ask your health care provider for help if you need support or information about quitting drugs.  Tell your health care provider if you often feel depressed.  Tell your health care provider if you have ever been abused or do not feel safe at home. Document Released: 07/12/2010 Document Revised: 05/13/2013 Document Reviewed: 11/28/2012 [facility] Patient Information 2015 [place], [place]. This information is not intended to replace advice given to you by your health care provider. Make sure you discuss any questions you have with your health care provider.

## 2013-09-02 NOTE — Telephone Encounter (Signed)
My patient Theresa Mejia and her husband Theresa Mejia who you have seen in my place before) have requested you (Dr [name]) to take my place as their PCP given my limited availability. They have been impressed with the medical care and attention you provide as demonstrated at your visits with Theresa Santee. She will not need a visit for 5mo until her next AWV. He sees PCP every 3-33mo. Please let our schedulers know if you agree to take over as PCP for these nice patients. Thanks so much for your excellent care

## 2013-09-02 NOTE — Telephone Encounter (Signed)
I approve of this.

## 2013-09-02 NOTE — Progress Notes (Signed)
Pre visit review using our clinic review tool, if applicable. No additional management support is needed unless otherwise documented below in the visit note. 

## 2013-09-02 NOTE — Telephone Encounter (Signed)
Thank you Theresa Mejia or Theresa Mejia - please change these 2 patients PCP to Dr [name]  then contact them to  Let them know Dr [name] has accepted them as patients Ok to schedule ROV with Dr [name]:  year for Theresa Mejia [facility]) and 3-6 mo for Theresa Mejia -or move any appt he may have scheduled with me to Dr [name]. Thanks to all

## 2013-09-03 NOTE — Telephone Encounter (Signed)
Contacted patient - notified her that Dr. [name] was taking her and her husband on as patients.  Changed PCP in system for both.  She did not wish to schedule appointments for either at this moment but stated she would call be to do so.

## 2013-09-04 ENCOUNTER — Telehealth: Payer: Self-pay

## 2013-09-04 NOTE — Telephone Encounter (Signed)
LVM   RE: lab results.

## 2013-10-28 ENCOUNTER — Other Ambulatory Visit: Payer: Self-pay | Admitting: Internal Medicine

## 2013-11-01 ENCOUNTER — Ambulatory Visit (INDEPENDENT_AMBULATORY_CARE_PROVIDER_SITE_OTHER): Payer: Medicare Other | Admitting: Internal Medicine

## 2013-11-01 ENCOUNTER — Encounter: Payer: Self-pay | Admitting: Internal Medicine

## 2013-11-01 VITALS — BP 112/72 | HR 56 | Temp 98.5°F | Resp 16 | Ht 60.0 in | Wt 155.0 lb

## 2013-11-01 DIAGNOSIS — J302 Other seasonal allergic rhinitis: Secondary | ICD-10-CM

## 2013-11-01 DIAGNOSIS — F418 Other specified anxiety disorders: Secondary | ICD-10-CM

## 2013-11-01 DIAGNOSIS — J0101 Acute recurrent maxillary sinusitis: Secondary | ICD-10-CM | POA: Insufficient documentation

## 2013-11-01 DIAGNOSIS — E038 Other specified hypothyroidism: Secondary | ICD-10-CM

## 2013-11-01 DIAGNOSIS — I10 Essential (primary) hypertension: Secondary | ICD-10-CM

## 2013-11-01 DIAGNOSIS — E7800 Pure hypercholesterolemia, unspecified: Secondary | ICD-10-CM

## 2013-11-01 MED ORDER — CEFUROXIME AXETIL 500 MG PO TABS: 500.0000 mg | ORAL_TABLET | Freq: Two times a day (BID) | ORAL | Status: DC

## 2013-11-01 MED ORDER — METHYLPREDNISOLONE ACETATE 80 MG/ML IJ SUSP
120.0000 mg | Freq: Once | INTRAMUSCULAR | Status: AC
  Administered 2013-11-01: 120 mg via INTRAMUSCULAR

## 2013-11-01 NOTE — Assessment & Plan Note (Signed)
She has s/s of eustachian tube dysfunction I gave her an injection of depo-medrol IM to help with symptom relief

## 2013-11-01 NOTE — Assessment & Plan Note (Signed)
Will treat the infection with ceftin 

## 2013-11-01 NOTE — Assessment & Plan Note (Signed)
Her BP is well controlled 

## 2013-11-01 NOTE — Patient Instructions (Signed)

## 2013-11-01 NOTE — Progress Notes (Signed)
Subjective:    Patient ID: Theresa Mejia, female    DOB: 1940/03/24, 73 y.o.   MRN: [id number]  Sinusitis This is a new problem. The current episode started in the past 7 days. The problem has been gradually worsening since onset. There has been no fever. The fever has been present for less than 1 day. Her pain is at a severity of 0/10. She is experiencing no pain. Associated symptoms include chills, congestion, ear pain ("popping and pressure in both ears"), headaches and sinus pressure. Pertinent negatives include no coughing, diaphoresis, hoarse voice, neck pain, shortness of breath, sneezing, sore throat or swollen glands. Past treatments include acetaminophen and saline sprays. The treatment provided mild relief.      Review of Systems  Constitutional: Positive for chills. Negative for fever, diaphoresis, activity change, appetite change, fatigue and unexpected weight change.  HENT: Positive for congestion, ear pain ("popping and pressure in both ears"), postnasal drip, rhinorrhea and sinus pressure. Negative for facial swelling, hearing loss, hoarse voice, nosebleeds, sneezing, sore throat, tinnitus, trouble swallowing and voice change.   Eyes: Negative.  Negative for visual disturbance.  Respiratory: Negative.  Negative for cough, choking, chest tightness, shortness of breath and stridor.   Cardiovascular: Negative.  Negative for chest pain, palpitations and leg swelling.  Gastrointestinal: Negative.  Negative for abdominal pain.  Endocrine: Negative.   Genitourinary: Negative.   Musculoskeletal: Negative.  Negative for arthralgias and neck pain.  Skin: Negative.  Negative for rash.  Allergic/Immunologic: Negative.   Neurological: Positive for headaches.  Hematological: Negative.  Negative for adenopathy. Does not bruise/bleed easily.  Psychiatric/Behavioral: Negative.        Objective:   Physical Exam  Vitals reviewed. Constitutional: She is oriented to person, place, and  time. She appears well-developed and well-nourished.  Non-toxic appearance. She does not have a sickly appearance. She does not appear ill. No distress.  HENT:  Head: Normocephalic and atraumatic.  Right Ear: Hearing, tympanic membrane, external ear and ear canal normal.  Left Ear: Hearing, tympanic membrane, external ear and ear canal normal.  Nose: Mucosal edema and rhinorrhea present. No sinus tenderness or nasal septal hematoma. No epistaxis. Right sinus exhibits no maxillary sinus tenderness and no frontal sinus tenderness. Left sinus exhibits maxillary sinus tenderness. Left sinus exhibits no frontal sinus tenderness.  Mouth/Throat: Oropharynx is clear and moist and mucous membranes are normal. Mucous membranes are not pale, not dry and not cyanotic. No oral lesions. No trismus in the jaw. No uvula swelling. No oropharyngeal exudate, posterior oropharyngeal edema, posterior oropharyngeal erythema or tonsillar abscesses.  Eyes: Conjunctivae are normal. Right eye exhibits no discharge. Left eye exhibits no discharge. No scleral icterus.  Neck: Normal range of motion. Neck supple. No JVD present. No tracheal deviation present. No thyromegaly present.  Cardiovascular: Normal rate, regular rhythm, normal heart sounds and intact distal pulses.  Exam reveals no gallop and no friction rub.   No murmur heard. Pulmonary/Chest: Effort normal and breath sounds normal. No stridor. No respiratory distress. She has no wheezes. She has no rales. She exhibits no tenderness.  Abdominal: Soft. Bowel sounds are normal. She exhibits no distension and no mass. There is no tenderness. There is no rebound and no guarding.  Musculoskeletal: Normal range of motion. She exhibits no edema and no tenderness.  Lymphadenopathy:    She has no cervical adenopathy.  Neurological: She is oriented to person, place, and time.  Skin: Skin is warm and dry. No rash noted. She is not  diaphoretic. No erythema. No pallor.    Psychiatric: She has a normal mood and affect. Her behavior is normal. Judgment and thought content normal.     Lab Results  Component Value Date   WBC 5.0 09/02/2013   HGB 13.5 09/02/2013   HCT 40.4 09/02/2013   PLT 318.0 09/02/2013   GLUCOSE 79 09/02/2013   CHOL 199 09/02/2013   TRIG 111.0 09/02/2013   HDL 40.30 09/02/2013   LDLDIRECT 148.3 09/01/2010   LDLCALC 137* 09/02/2013   ALT 20 09/02/2013   AST 25 09/02/2013   NA 138 09/02/2013   K 4.3 09/02/2013   CL 102 09/02/2013   CREATININE 0.7 09/02/2013   BUN 15 09/02/2013   CO2 27 09/02/2013   TSH 1.37 09/02/2013   HGBA1C 5.5 10/15/2012       Assessment & Plan:

## 2013-11-04 ENCOUNTER — Other Ambulatory Visit: Payer: Self-pay | Admitting: Internal Medicine

## 2013-11-05 ENCOUNTER — Telehealth: Payer: Self-pay | Admitting: Internal Medicine

## 2013-11-05 NOTE — Telephone Encounter (Signed)
emmi emailed °

## 2014-02-03 DIAGNOSIS — Z961 Presence of intraocular lens: Secondary | ICD-10-CM | POA: Diagnosis not present

## 2014-02-05 ENCOUNTER — Encounter: Payer: Self-pay | Admitting: Internal Medicine

## 2014-02-05 ENCOUNTER — Ambulatory Visit (INDEPENDENT_AMBULATORY_CARE_PROVIDER_SITE_OTHER): Payer: Medicare Other | Admitting: Internal Medicine

## 2014-02-05 ENCOUNTER — Telehealth: Payer: Self-pay | Admitting: *Deleted

## 2014-02-05 ENCOUNTER — Other Ambulatory Visit (INDEPENDENT_AMBULATORY_CARE_PROVIDER_SITE_OTHER): Payer: Medicare Other

## 2014-02-05 VITALS — BP 124/88 | HR 64 | Temp 98.4°F | Resp 16 | Ht 60.0 in | Wt 156.0 lb

## 2014-02-05 DIAGNOSIS — R300 Dysuria: Secondary | ICD-10-CM | POA: Diagnosis not present

## 2014-02-05 DIAGNOSIS — R350 Frequency of micturition: Secondary | ICD-10-CM | POA: Diagnosis not present

## 2014-02-05 LAB — URINALYSIS, ROUTINE W REFLEX MICROSCOPIC
Bilirubin Urine: NEGATIVE
Ketones, ur: NEGATIVE
Nitrite: NEGATIVE
Specific Gravity, Urine: <=1.005 — AB (ref 1.000–1.030)
Total Protein, Urine: NEGATIVE
URINE GLUCOSE: NEGATIVE
Urobilinogen, UA: 0.2 (ref 0.0–1.0)
pH: 6 (ref 5.0–8.0)

## 2014-02-05 MED ORDER — NITROFURANTOIN MONOHYD MACRO 100 MG PO CAPS: 100.0000 mg | ORAL_CAPSULE | Freq: Two times a day (BID) | ORAL | Status: DC

## 2014-02-05 MED ORDER — PHENAZOPYRIDINE HCL 200 MG PO TABS: 200.0000 mg | ORAL_TABLET | Freq: Three times a day (TID) | ORAL | Status: DC | PRN

## 2014-02-05 NOTE — Telephone Encounter (Signed)
Received called from [place] he stated pt was looking for two rx, but only received pyridium. Inform him md also rx macrobid gave md instructions...Johny Chess

## 2014-02-05 NOTE — Patient Instructions (Signed)
Drink as much nondairy fluids as possible. Avoid spicy foods or alcohol as  these may aggravate the bladder / prostate. Do not take decongestants. Avoid narcotics if possible.

## 2014-02-05 NOTE — Progress Notes (Signed)
Pre visit review using our clinic review tool, if applicable. No additional management support is needed unless otherwise documented below in the visit note. 

## 2014-02-05 NOTE — Progress Notes (Signed)
   Subjective:    Patient ID: Theresa Mejia, female    DOB: 11-03-1940, 74 y.o.   MRN: [id number]  HPI  Symptoms began 2 days ago as frequency ,nocturia and dysuria. She has fatigue but denies chills and sweats. She describes myalgias as well as malaise.  She has increased her water intake to 64 ounces a day.  She has some lower abdominal discomfort and discomfort in the flanks.  Review of Systems Hematuria or pyuria denied.  She has no diarrhea ,constipation, melena,or  rectal bleeding.    Objective:   Physical Exam   Pertinent or positive findings include: She appears much younger than stated age. She does have some ptosis.  General appearance :adequately nourished; in no distress.  Eyes: No conjunctival inflammation or scleral icterus is present.  Oral exam: Dental hygiene is good. Lips and gums are healthy appearing.There is no oropharyngeal erythema or exudate noted.   Heart:  Normal rate and regular rhythm. S1 and S2 normal without gallop, murmur, click, rub or other extra sounds     Lungs:Chest clear to auscultation; no wheezes, rhonchi,rales ,or rubs present.No increased work of breathing.   Abdomen: bowel sounds normal, soft and non-tender without masses, organomegaly or hernias noted.  No guarding or rebound. No flank tenderness to percussion.  Vascular : all pulses equal ; no bruits present.  Skin:Warm & dry.  Intact without suspicious lesions or rashes ; no jaundice or tenting  Lymphatic: No lymphadenopathy is noted about the head, neck, axilla         Assessment & Plan:  #1 dysuria, probable urinary tract infection  Plan: As per standard of care she'll be placed on nitrofurantoin pending results of the culture and sensitivity.

## 2014-02-07 LAB — URINE CULTURE: Colony Count: >=100000

## 2014-02-19 DIAGNOSIS — H25812 Combined forms of age-related cataract, left eye: Secondary | ICD-10-CM | POA: Insufficient documentation

## 2014-02-19 DIAGNOSIS — H11823 Conjunctivochalasis, bilateral: Secondary | ICD-10-CM | POA: Insufficient documentation

## 2014-02-19 DIAGNOSIS — H52203 Unspecified astigmatism, bilateral: Secondary | ICD-10-CM | POA: Diagnosis not present

## 2014-02-19 DIAGNOSIS — H02833 Dermatochalasis of right eye, unspecified eyelid: Secondary | ICD-10-CM | POA: Insufficient documentation

## 2014-02-19 DIAGNOSIS — Z961 Presence of intraocular lens: Secondary | ICD-10-CM | POA: Diagnosis not present

## 2014-02-19 DIAGNOSIS — H5203 Hypermetropia, bilateral: Secondary | ICD-10-CM | POA: Diagnosis not present

## 2014-02-19 DIAGNOSIS — H02403 Unspecified ptosis of bilateral eyelids: Secondary | ICD-10-CM | POA: Diagnosis not present

## 2014-02-19 DIAGNOSIS — H538 Other visual disturbances: Secondary | ICD-10-CM | POA: Diagnosis not present

## 2014-02-19 DIAGNOSIS — H11153 Pinguecula, bilateral: Secondary | ICD-10-CM | POA: Insufficient documentation

## 2014-02-19 DIAGNOSIS — H04123 Dry eye syndrome of bilateral lacrimal glands: Secondary | ICD-10-CM | POA: Diagnosis not present

## 2014-02-19 DIAGNOSIS — H02836 Dermatochalasis of left eye, unspecified eyelid: Secondary | ICD-10-CM | POA: Diagnosis not present

## 2014-03-13 ENCOUNTER — Telehealth: Payer: Self-pay

## 2014-03-13 MED ORDER — FLUOXETINE HCL 40 MG PO CAPS: 40.0000 mg | ORAL_CAPSULE | Freq: Every day | ORAL | Status: DC

## 2014-03-13 NOTE — Telephone Encounter (Signed)
done

## 2014-03-13 NOTE — Telephone Encounter (Signed)
Pt lvm on triage and would like to have her rx for prozac refilled.  She stated that she did communicate the desire to d/c with PCP at last visit.  [phone]

## 2014-03-13 NOTE — Telephone Encounter (Signed)
Patient notified

## 2014-04-08 ENCOUNTER — Ambulatory Visit (INDEPENDENT_AMBULATORY_CARE_PROVIDER_SITE_OTHER): Payer: Medicare Other | Admitting: Internal Medicine

## 2014-04-08 ENCOUNTER — Encounter: Payer: Self-pay | Admitting: Internal Medicine

## 2014-04-08 ENCOUNTER — Other Ambulatory Visit (INDEPENDENT_AMBULATORY_CARE_PROVIDER_SITE_OTHER): Payer: Medicare Other

## 2014-04-08 VITALS — BP 118/76 | HR 58 | Temp 98.5°F | Resp 16 | Ht 60.0 in | Wt 156.8 lb

## 2014-04-08 DIAGNOSIS — J302 Other seasonal allergic rhinitis: Secondary | ICD-10-CM

## 2014-04-08 DIAGNOSIS — E038 Other specified hypothyroidism: Secondary | ICD-10-CM

## 2014-04-08 DIAGNOSIS — I10 Essential (primary) hypertension: Secondary | ICD-10-CM | POA: Diagnosis not present

## 2014-04-08 DIAGNOSIS — F418 Other specified anxiety disorders: Secondary | ICD-10-CM

## 2014-04-08 LAB — CBC WITH DIFFERENTIAL/PLATELET
BASOS ABS: 0 10*3/uL (ref 0.0–0.1)
BASOS PCT: 0.4 % (ref 0.0–3.0)
EOS ABS: 0.1 10*3/uL (ref 0.0–0.7)
EOS PCT: 1.2 % (ref 0.0–5.0)
HEMATOCRIT: 37.9 % (ref 36.0–46.0)
Hemoglobin: 13 g/dL (ref 12.0–15.0)
LYMPHS ABS: 1.5 10*3/uL (ref 0.7–4.0)
LYMPHS PCT: 26.1 % (ref 12.0–46.0)
MCHC: 34.4 g/dL (ref 30.0–36.0)
MCV: 84.4 fl (ref 78.0–100.0)
MONOS PCT: 9.4 % (ref 3.0–12.0)
Monocytes Absolute: 0.5 10*3/uL (ref 0.1–1.0)
NEUTROS PCT: 62.9 % (ref 43.0–77.0)
Neutro Abs: 3.6 10*3/uL (ref 1.4–7.7)
PLATELETS: 287 10*3/uL (ref 150.0–400.0)
RBC: 4.49 Mil/uL (ref 3.87–5.11)
RDW: 13.6 % (ref 11.5–15.5)
WBC: 5.7 10*3/uL (ref 4.0–10.5)

## 2014-04-08 LAB — BASIC METABOLIC PANEL
BUN: 23 mg/dL (ref 6–23)
CALCIUM: 9.4 mg/dL (ref 8.4–10.5)
CO2: 29 mEq/L (ref 19–32)
CREATININE: 0.65 mg/dL (ref 0.40–1.20)
Chloride: 103 mEq/L (ref 96–112)
GFR: 95.43 mL/min (ref >60.00)
Glucose, Bld: 80 mg/dL (ref 70–99)
Potassium: 4 mEq/L (ref 3.5–5.1)
SODIUM: 137 meq/L (ref 135–145)

## 2014-04-08 LAB — TSH: TSH: 1.9 u[IU]/mL (ref 0.35–4.50)

## 2014-04-08 MED ORDER — METHYLPREDNISOLONE 4 MG PO KIT: PACK | ORAL | Status: DC

## 2014-04-08 MED ORDER — ALPRAZOLAM 0.25 MG PO TABS: 0.2500 mg | ORAL_TABLET | Freq: Two times a day (BID) | ORAL | Status: DC | PRN

## 2014-04-08 MED ORDER — CYANOCOBALAMIN 2000 MCG PO TABS: 2000.0000 ug | ORAL_TABLET | Freq: Every day | ORAL | Status: DC

## 2014-04-08 NOTE — Progress Notes (Signed)
Pre visit review using our clinic review tool, if applicable. No additional management support is needed unless otherwise documented below in the visit note. 

## 2014-04-08 NOTE — Patient Instructions (Signed)
Barotitis Media Barotitis media is inflammation of your middle ear. This occurs when the auditory tube (eustachian tube) leading from the back of your nose (nasopharynx) to your eardrum is blocked. This blockage may result from a cold, environmental allergies, or an upper respiratory infection. Unresolved barotitis media may lead to damage or hearing loss (barotrauma), which may become permanent. HOME CARE INSTRUCTIONS   Use medicines as recommended by your health care provider. Over-the-counter medicines will help unblock the canal and can help during times of air travel.  Do not put anything into your ears to clean or unplug them. Eardrops will not be helpful.  Do not swim, dive, or fly until your health care provider says it is all right to do so. If these activities are necessary, chewing gum with frequent, forceful swallowing may help. It is also helpful to hold your nose and gently blow to pop your ears for equalizing pressure changes. This forces air into the eustachian tube.  Only take over-the-counter or prescription medicines for pain, discomfort, or fever as directed by your health care provider.  A decongestant may be helpful in decongesting the middle ear and make pressure equalization easier. SEEK MEDICAL CARE IF:  You experience a serious form of dizziness in which you feel as if the room is spinning and you feel nauseated (vertigo).  Your symptoms only involve one ear. SEEK IMMEDIATE MEDICAL CARE IF:   You develop a severe headache, dizziness, or severe ear pain.  You have bloody or pus-like drainage from your ears.  You develop a fever.  Your problems do not improve or become worse. MAKE SURE YOU:   Understand these instructions.  Will watch your condition.  Will get help right away if you are not doing well or get worse. Document Released: 12/25/1999 Document Revised: 10/17/2012 Document Reviewed: 07/24/2012 Exit[employer] Patient Information 2015 [employer]. This  information is not intended to replace advice given to you by your health care provider. Make sure you discuss any questions you have with your health care provider.  

## 2014-04-09 NOTE — Assessment & Plan Note (Signed)
Will give an RX for xanax Cont prozac

## 2014-04-09 NOTE — Assessment & Plan Note (Signed)
Her TSh is in the normal range Will cont the current dose of synthroid

## 2014-04-09 NOTE — Progress Notes (Signed)
Subjective:    Patient ID: Theresa Mejia, female    DOB: 26-Dec-1940, 74 y.o.   MRN: [id number]  HPI Comments: She complains of nasal congestion with sneezing and "pain and popping" in her left ear. She also requests an Rx for xanax since she has been taking her husband's supply for anxiety and insomnia.     Review of Systems  Constitutional: Positive for fatigue and unexpected weight change (wt gain). Negative for fever, chills, diaphoresis, activity change and appetite change.  HENT: Positive for congestion, postnasal drip, rhinorrhea and sneezing. Negative for dental problem, drooling, ear discharge, ear pain, facial swelling, hearing loss, mouth sores, nosebleeds, sinus pressure, sore throat, tinnitus, trouble swallowing and voice change.   Eyes: Negative.   Respiratory: Negative.  Negative for cough, choking, chest tightness, shortness of breath and stridor.   Cardiovascular: Negative.  Negative for chest pain, palpitations and leg swelling.  Gastrointestinal: Negative.  Negative for nausea, vomiting, abdominal pain, diarrhea, constipation and blood in stool.  Endocrine: Negative.   Genitourinary: Negative.   Musculoskeletal: Negative.   Skin: Negative.   Allergic/Immunologic: Negative.   Neurological: Negative.  Negative for dizziness, tremors, syncope, light-headedness, numbness and headaches.  Hematological: Negative.  Negative for adenopathy. Does not bruise/bleed easily.  Psychiatric/Behavioral: Positive for sleep disturbance and dysphoric mood. Negative for suicidal ideas, hallucinations, behavioral problems, confusion, self-injury, decreased concentration and agitation. The patient is nervous/anxious. The patient is not hyperactive.        Objective:   Physical Exam  Constitutional: She is oriented to person, place, and time. She appears well-developed and well-nourished. No distress.  HENT:  Right Ear: Hearing, tympanic membrane, external ear and ear canal normal.  Left  Ear: Hearing, tympanic membrane, external ear and ear canal normal.  Nose: Mucosal edema and rhinorrhea present. No nose lacerations, sinus tenderness, nasal deformity, septal deviation or nasal septal hematoma. No epistaxis.  No foreign bodies. Right sinus exhibits no maxillary sinus tenderness and no frontal sinus tenderness. Left sinus exhibits no maxillary sinus tenderness and no frontal sinus tenderness.  Mouth/Throat: Oropharynx is clear and moist and mucous membranes are normal. Mucous membranes are not pale, not dry and not cyanotic. No oral lesions. No trismus in the jaw. No uvula swelling. No oropharyngeal exudate, posterior oropharyngeal edema, posterior oropharyngeal erythema or tonsillar abscesses.  Eyes: Conjunctivae are normal. Right eye exhibits no discharge. Left eye exhibits no discharge. No scleral icterus.  Neck: Normal range of motion. Neck supple. No JVD present. No tracheal deviation present. No thyromegaly present.  Cardiovascular: Normal rate, regular rhythm, normal heart sounds and intact distal pulses.  Exam reveals no gallop and no friction rub.   No murmur heard. Pulmonary/Chest: Effort normal and breath sounds normal. No stridor. No respiratory distress. She has no wheezes. She has no rales. She exhibits no tenderness.  Abdominal: Soft. Bowel sounds are normal. She exhibits no distension and no mass. There is no tenderness. There is no rebound and no guarding.  Musculoskeletal: Normal range of motion. She exhibits no edema or tenderness.  Lymphadenopathy:    She has no cervical adenopathy.  Neurological: She is oriented to person, place, and time.  Skin: Skin is warm and dry. No rash noted. She is not diaphoretic. No erythema. No pallor.  Psychiatric: She has a normal mood and affect. Her behavior is normal. Judgment and thought content normal.  Vitals reviewed.   Lab Results  Component Value Date   WBC 5.7 04/08/2014   HGB 13.0 04/08/2014  HCT 37.9 04/08/2014    PLT 287.0 04/08/2014   GLUCOSE 80 04/08/2014   CHOL 199 09/02/2013   TRIG 111.0 09/02/2013   HDL 40.30 09/02/2013   LDLDIRECT 148.3 09/01/2010   LDLCALC 137* 09/02/2013   ALT 20 09/02/2013   AST 25 09/02/2013   NA 137 04/08/2014   K 4.0 04/08/2014   CL 103 04/08/2014   CREATININE 0.65 04/08/2014   BUN 23 04/08/2014   CO2 29 04/08/2014   TSH 1.90 04/08/2014   HGBA1C 5.5 10/15/2012        Assessment & Plan:

## 2014-04-09 NOTE — Assessment & Plan Note (Signed)
Her BP is well controlled 

## 2014-04-09 NOTE — Assessment & Plan Note (Signed)
She has a flare of AR and has some eustachian tube dysfunction Will treat with a course of medrol dose pak Will cont claritin and flonase ns

## 2014-04-14 DIAGNOSIS — H04123 Dry eye syndrome of bilateral lacrimal glands: Secondary | ICD-10-CM | POA: Diagnosis not present

## 2014-04-21 ENCOUNTER — Encounter: Payer: Self-pay | Admitting: Internal Medicine

## 2014-04-21 ENCOUNTER — Ambulatory Visit (INDEPENDENT_AMBULATORY_CARE_PROVIDER_SITE_OTHER): Payer: Medicare Other | Admitting: Internal Medicine

## 2014-04-21 VITALS — BP 134/78 | HR 59 | Temp 98.2°F | Resp 16 | Ht 60.0 in | Wt 154.0 lb

## 2014-04-21 DIAGNOSIS — F418 Other specified anxiety disorders: Secondary | ICD-10-CM | POA: Diagnosis not present

## 2014-04-21 DIAGNOSIS — J302 Other seasonal allergic rhinitis: Secondary | ICD-10-CM | POA: Diagnosis not present

## 2014-04-21 DIAGNOSIS — I10 Essential (primary) hypertension: Secondary | ICD-10-CM | POA: Diagnosis not present

## 2014-04-21 MED ORDER — MONTELUKAST SODIUM 10 MG PO TABS: 10.0000 mg | ORAL_TABLET | Freq: Every day | ORAL | Status: DC

## 2014-04-21 MED ORDER — AZELASTINE HCL 0.1 % NA SOLN: 1.0000 | Freq: Two times a day (BID) | NASAL | Status: DC

## 2014-04-21 NOTE — Patient Instructions (Signed)

## 2014-04-21 NOTE — Progress Notes (Signed)
Pre visit review using our clinic review tool, if applicable. No additional management support is needed unless otherwise documented below in the visit note. 

## 2014-04-21 NOTE — Assessment & Plan Note (Signed)
Her BP is well controlled 

## 2014-04-21 NOTE — Assessment & Plan Note (Signed)
Will add singulair and astelin NS for better symptom relief

## 2014-04-22 NOTE — Progress Notes (Signed)
Subjective:    Patient ID: Theresa Mejia, female    DOB: 1940/09/27, 74 y.o.   MRN: [id number]  HPI Comments: She complains of persistent nasal congestion, pain and popping in her left ear, post-nasal drip, and runny nose despite several meds to help with this.     Review of Systems  Constitutional: Negative.  Negative for fever, chills, diaphoresis, appetite change and fatigue.  HENT: Positive for congestion, ear pain, postnasal drip, rhinorrhea and sneezing. Negative for dental problem, drooling, ear discharge, facial swelling, hearing loss, mouth sores, nosebleeds, sinus pressure, sore throat, tinnitus, trouble swallowing and voice change.   Eyes: Negative.  Negative for visual disturbance.  Respiratory: Negative.  Negative for cough, choking, chest tightness, shortness of breath and stridor.   Cardiovascular: Negative.  Negative for chest pain, palpitations and leg swelling.  Gastrointestinal: Negative.  Negative for nausea, vomiting, abdominal pain, diarrhea and constipation.  Endocrine: Negative.   Genitourinary: Negative.   Musculoskeletal: Negative.  Negative for myalgias, back pain, joint swelling and arthralgias.  Skin: Negative.  Negative for rash.  Allergic/Immunologic: Negative.   Neurological: Negative.  Negative for dizziness, syncope, facial asymmetry, speech difficulty, weakness, light-headedness, numbness and headaches.  Hematological: Negative.  Negative for adenopathy. Does not bruise/bleed easily.  Psychiatric/Behavioral: Positive for sleep disturbance and dysphoric mood. Negative for suicidal ideas, hallucinations, behavioral problems, confusion, self-injury, decreased concentration and agitation. The patient is nervous/anxious. The patient is not hyperactive.        Objective:   Physical Exam  Constitutional: She is oriented to person, place, and time. She appears well-developed and well-nourished. No distress.  HENT:  Right Ear: Hearing, tympanic membrane,  external ear and ear canal normal.  Left Ear: Hearing, tympanic membrane, external ear and ear canal normal.  Nose: Mucosal edema and rhinorrhea present. No nose lacerations, sinus tenderness, nasal deformity, septal deviation or nasal septal hematoma. No epistaxis.  No foreign bodies. Right sinus exhibits no maxillary sinus tenderness and no frontal sinus tenderness. Left sinus exhibits no maxillary sinus tenderness and no frontal sinus tenderness.  Mouth/Throat: Oropharynx is clear and moist and mucous membranes are normal. Mucous membranes are not pale, not dry and not cyanotic. No oral lesions. No trismus in the jaw. No uvula swelling. No oropharyngeal exudate, posterior oropharyngeal edema, posterior oropharyngeal erythema or tonsillar abscesses.  Eyes: Conjunctivae are normal. Right eye exhibits no discharge. Left eye exhibits no discharge. No scleral icterus.  Neck: Normal range of motion. Neck supple. No JVD present. No tracheal deviation present. No thyromegaly present.  Cardiovascular: Normal rate, regular rhythm, normal heart sounds and intact distal pulses.  Exam reveals no gallop and no friction rub.   No murmur heard. Pulmonary/Chest: Effort normal and breath sounds normal. No stridor. No respiratory distress. She has no wheezes. She has no rales. She exhibits no tenderness.  Abdominal: Soft. Bowel sounds are normal. She exhibits no distension and no mass. There is no tenderness. There is no rebound and no guarding.  Musculoskeletal: Normal range of motion. She exhibits no edema or tenderness.  Lymphadenopathy:    She has no cervical adenopathy.  Neurological: She is oriented to person, place, and time.  Skin: Skin is warm and dry. No rash noted. She is not diaphoretic. No erythema. No pallor.  Psychiatric: She has a normal mood and affect. Her behavior is normal. Judgment and thought content normal.  Vitals reviewed.    Lab Results  Component Value Date   WBC 5.7 04/08/2014    HGB 13.0  04/08/2014   HCT 37.9 04/08/2014   PLT 287.0 04/08/2014   GLUCOSE 80 04/08/2014   CHOL 199 09/02/2013   TRIG 111.0 09/02/2013   HDL 40.30 09/02/2013   LDLDIRECT 148.3 09/01/2010   LDLCALC 137* 09/02/2013   ALT 20 09/02/2013   AST 25 09/02/2013   NA 137 04/08/2014   K 4.0 04/08/2014   CL 103 04/08/2014   CREATININE 0.65 04/08/2014   BUN 23 04/08/2014   CO2 29 04/08/2014   TSH 1.90 04/08/2014   HGBA1C 5.5 10/15/2012       Assessment & Plan:

## 2014-04-22 NOTE — Assessment & Plan Note (Signed)
She is not willing to change prozac to something else She wants to continue taking a BZD

## 2014-05-13 ENCOUNTER — Telehealth: Payer: Self-pay | Admitting: Internal Medicine

## 2014-05-13 MED ORDER — METHYLPREDNISOLONE 4 MG PO TBPK: ORAL_TABLET | ORAL | Status: DC

## 2014-05-13 NOTE — Telephone Encounter (Signed)
Advised patient rx has been sent in by dr Jenny Reichmann

## 2014-05-13 NOTE — Telephone Encounter (Signed)
Pt called in and said that she is not feeling much better and wants to know if she could get another round of the prednisone.  Pt is suppose to be flying in a week and is concerned about flying with her head feeling the way it does?    Best number [phone]

## 2014-05-13 NOTE — Telephone Encounter (Signed)
rx sent

## 2014-05-14 ENCOUNTER — Ambulatory Visit (INDEPENDENT_AMBULATORY_CARE_PROVIDER_SITE_OTHER): Payer: Medicare Other | Admitting: Internal Medicine

## 2014-05-14 ENCOUNTER — Encounter: Payer: Self-pay | Admitting: Internal Medicine

## 2014-05-14 VITALS — BP 118/78 | HR 68 | Temp 98.5°F | Resp 16 | Ht 60.0 in | Wt 154.0 lb

## 2014-05-14 DIAGNOSIS — I10 Essential (primary) hypertension: Secondary | ICD-10-CM | POA: Diagnosis not present

## 2014-05-14 DIAGNOSIS — J0101 Acute recurrent maxillary sinusitis: Secondary | ICD-10-CM | POA: Diagnosis not present

## 2014-05-14 MED ORDER — HYDROCOD POLST-CPM POLST ER 10-8 MG/5ML PO SUER: 5.0000 mL | Freq: Two times a day (BID) | ORAL | Status: DC | PRN

## 2014-05-14 MED ORDER — AMOXICILLIN-POT CLAVULANATE 875-125 MG PO TABS: 1.0000 | ORAL_TABLET | Freq: Two times a day (BID) | ORAL | Status: AC

## 2014-05-14 NOTE — Assessment & Plan Note (Signed)
Her BP is well controlled 

## 2014-05-14 NOTE — Progress Notes (Signed)
   Subjective:    Patient ID: Theresa Mejia, female    DOB: January 25, 1940, 74 y.o.   MRN: [id number]  Sinusitis This is a recurrent problem. The current episode started in the past 7 days. The problem has been gradually worsening since onset. There has been no fever. The fever has been present for less than 1 day. Her pain is at a severity of 0/10. She is experiencing no pain. Associated symptoms include congestion, coughing, sinus pressure, sneezing and a sore throat. Pertinent negatives include no chills, diaphoresis, ear pain, headaches, hoarse voice, neck pain, shortness of breath or swollen glands. Past treatments include oral decongestants (medrol dose pak, flonase, astelin, claritin, singulair).      Review of Systems  Constitutional: Negative.  Negative for fever, chills, diaphoresis, appetite change and fatigue.  HENT: Positive for congestion, rhinorrhea, sinus pressure, sneezing and sore throat. Negative for ear pain, facial swelling, hoarse voice, postnasal drip, trouble swallowing and voice change.   Eyes: Negative.   Respiratory: Positive for cough. Negative for choking, chest tightness, shortness of breath and stridor.   Cardiovascular: Negative.  Negative for chest pain, palpitations and leg swelling.  Gastrointestinal: Negative.  Negative for nausea, vomiting, abdominal pain, diarrhea, constipation and blood in stool.  Endocrine: Negative.   Genitourinary: Negative.   Musculoskeletal: Negative.  Negative for neck pain.  Skin: Negative.   Allergic/Immunologic: Negative.   Neurological: Negative.  Negative for headaches.  Hematological: Negative.  Negative for adenopathy. Does not bruise/bleed easily.  Psychiatric/Behavioral: Negative.        Objective:   Physical Exam  Constitutional: She is oriented to person, place, and time. She appears well-developed and well-nourished.  Non-toxic appearance. She does not have a sickly appearance. She does not appear ill. No distress.    HENT:  Right Ear: Hearing, tympanic membrane, external ear and ear canal normal.  Left Ear: Hearing, tympanic membrane, external ear and ear canal normal.  Nose: Rhinorrhea present. No mucosal edema or sinus tenderness. Right sinus exhibits no maxillary sinus tenderness and no frontal sinus tenderness. Left sinus exhibits maxillary sinus tenderness. Left sinus exhibits no frontal sinus tenderness.  Mouth/Throat: Oropharynx is clear and moist and mucous membranes are normal. Mucous membranes are not pale, not dry and not cyanotic. No oral lesions. No trismus in the jaw. No uvula swelling. No oropharyngeal exudate, posterior oropharyngeal edema, posterior oropharyngeal erythema or tonsillar abscesses.  Eyes: Conjunctivae are normal. Right eye exhibits no discharge. Left eye exhibits no discharge. No scleral icterus.  Neck: Normal range of motion. Neck supple. No JVD present. No tracheal deviation present. No thyromegaly present.  Cardiovascular: Normal rate, regular rhythm, normal heart sounds and intact distal pulses.  Exam reveals no gallop and no friction rub.   No murmur heard. Pulmonary/Chest: Effort normal and breath sounds normal. No stridor. No respiratory distress. She has no wheezes. She has no rales. She exhibits no tenderness.  Abdominal: Soft. Bowel sounds are normal. She exhibits no distension and no mass. There is no tenderness. There is no rebound and no guarding.  Musculoskeletal: Normal range of motion. She exhibits no edema or tenderness.  Lymphadenopathy:    She has no cervical adenopathy.  Neurological: She is oriented to person, place, and time.  Skin: Skin is warm and dry. No rash noted. She is not diaphoretic. No erythema. No pallor.  Vitals reviewed.         Assessment & Plan:

## 2014-05-14 NOTE — Patient Instructions (Signed)

## 2014-05-14 NOTE — Progress Notes (Signed)
Pre visit review using our clinic review tool, if applicable. No additional management support is needed unless otherwise documented below in the visit note. 

## 2014-05-14 NOTE — Assessment & Plan Note (Signed)
Will treat the infection with augmentin Will control the symptoms with tussionex susp

## 2014-05-16 ENCOUNTER — Other Ambulatory Visit: Payer: Self-pay | Admitting: Internal Medicine

## 2014-06-24 ENCOUNTER — Other Ambulatory Visit: Payer: Self-pay | Admitting: Internal Medicine

## 2014-07-17 DIAGNOSIS — D224 Melanocytic nevi of scalp and neck: Secondary | ICD-10-CM | POA: Diagnosis not present

## 2014-07-17 DIAGNOSIS — L821 Other seborrheic keratosis: Secondary | ICD-10-CM | POA: Diagnosis not present

## 2014-07-17 DIAGNOSIS — L720 Epidermal cyst: Secondary | ICD-10-CM | POA: Diagnosis not present

## 2014-07-29 ENCOUNTER — Other Ambulatory Visit: Payer: Self-pay | Admitting: Internal Medicine

## 2014-08-22 DIAGNOSIS — K219 Gastro-esophageal reflux disease without esophagitis: Secondary | ICD-10-CM | POA: Diagnosis not present

## 2014-08-22 DIAGNOSIS — K5900 Constipation, unspecified: Secondary | ICD-10-CM | POA: Diagnosis not present

## 2014-09-17 DIAGNOSIS — K644 Residual hemorrhoidal skin tags: Secondary | ICD-10-CM | POA: Diagnosis not present

## 2014-09-17 DIAGNOSIS — K641 Second degree hemorrhoids: Secondary | ICD-10-CM | POA: Diagnosis not present

## 2014-09-23 ENCOUNTER — Other Ambulatory Visit: Payer: Self-pay

## 2014-09-23 DIAGNOSIS — Z1231 Encounter for screening mammogram for malignant neoplasm of breast: Secondary | ICD-10-CM

## 2014-10-01 DIAGNOSIS — K625 Hemorrhage of anus and rectum: Secondary | ICD-10-CM | POA: Diagnosis not present

## 2014-10-01 DIAGNOSIS — R195 Other fecal abnormalities: Secondary | ICD-10-CM | POA: Diagnosis not present

## 2014-10-15 DIAGNOSIS — K641 Second degree hemorrhoids: Secondary | ICD-10-CM | POA: Diagnosis not present

## 2014-10-30 ENCOUNTER — Ambulatory Visit
Admission: RE | Admit: 2014-10-30 | Discharge: 2014-10-30 | Disposition: A | Discharge status: Home / Self Care | Payer: Medicare Other | Source: Ambulatory Visit

## 2014-10-30 DIAGNOSIS — Z1231 Encounter for screening mammogram for malignant neoplasm of breast: Secondary | ICD-10-CM

## 2014-10-31 LAB — HM MAMMOGRAPHY

## 2014-11-06 ENCOUNTER — Encounter: Payer: Self-pay | Admitting: Internal Medicine

## 2014-11-06 ENCOUNTER — Other Ambulatory Visit (INDEPENDENT_AMBULATORY_CARE_PROVIDER_SITE_OTHER): Payer: Medicare Other

## 2014-11-06 ENCOUNTER — Ambulatory Visit (INDEPENDENT_AMBULATORY_CARE_PROVIDER_SITE_OTHER): Payer: Medicare Other | Admitting: Internal Medicine

## 2014-11-06 VITALS — BP 126/70 | HR 58 | Temp 97.8°F | Resp 16 | Ht 60.0 in | Wt 151.0 lb

## 2014-11-06 DIAGNOSIS — Z23 Encounter for immunization: Secondary | ICD-10-CM

## 2014-11-06 DIAGNOSIS — R002 Palpitations: Secondary | ICD-10-CM

## 2014-11-06 DIAGNOSIS — I10 Essential (primary) hypertension: Secondary | ICD-10-CM

## 2014-11-06 DIAGNOSIS — E038 Other specified hypothyroidism: Secondary | ICD-10-CM

## 2014-11-06 LAB — BASIC METABOLIC PANEL
BUN: 23 mg/dL (ref 6–23)
CHLORIDE: 102 meq/L (ref 96–112)
CO2: 29 mEq/L (ref 19–32)
Calcium: 9.6 mg/dL (ref 8.4–10.5)
Creatinine, Ser: 0.74 mg/dL (ref 0.40–1.20)
GFR: 82.03 mL/min (ref >60.00)
Glucose, Bld: 92 mg/dL (ref 70–99)
POTASSIUM: 4.1 meq/L (ref 3.5–5.1)
SODIUM: 139 meq/L (ref 135–145)

## 2014-11-06 LAB — TSH: TSH: 0.74 u[IU]/mL (ref 0.35–4.50)

## 2014-11-06 NOTE — Progress Notes (Signed)
Pre visit review using our clinic review tool, if applicable. No additional management support is needed unless otherwise documented below in the visit note. 

## 2014-11-06 NOTE — Patient Instructions (Signed)

## 2014-11-06 NOTE — Progress Notes (Signed)
Subjective:  Patient ID: Theresa Mejia, female    DOB: 08-16-1940  Age: 74 y.o. MRN: [id number]  CC: Palpitations   HPI Theresa Mejia presents for a 3 week hx of palpitations ("heart racing") that lasts for about 30 seconds and spontaneously resolves, the symptoms usually occur with activity and are associated with lightheadedness and SOB but no syncope or near-syncope. She has some degree of palpitations every day.  Outpatient Prescriptions Prior to Visit  Medication Sig Dispense Refill  . ALPRAZolam (XANAX) 0.25 MG tablet Take 1 tablet (0.25 mg total) by mouth 2 (two) times daily as needed for anxiety. 60 tablet 3  . amLODipine (NORVASC) 10 MG tablet Take 1 tablet (10 mg total) by mouth daily. 90 tablet 3  . Cholecalciferol (VITAMIN D3) 2000 UNITS TABS Take by mouth daily.    [name] FLUoxetine (PROZAC) 40 MG capsule Take 1 capsule (40 mg total) by mouth daily. 30 capsule 11  . fluticasone (FLONASE) 50 MCG/ACT nasal spray USE TWO SPRAYS IN EACH NOSTRIL DAILY 16 g 3  . guaiFENesin (MUCINEX) 600 MG 12 hr tablet Take 1,200 mg by mouth as needed.     [name] levothyroxine (SYNTHROID, LEVOTHROID) 125 MCG tablet TAKE ONE TABLET BY MOUTH ONE TIME DAILY 90 tablet 1  . montelukast (SINGULAIR) 10 MG tablet Take 1 tablet (10 mg total) by mouth at bedtime. 30 tablet 3  . Multiple Vitamin (MULTIVITAMIN) capsule Take 1 capsule by mouth daily.      . valsartan (DIOVAN) 160 MG tablet Take 1 tablet (160 mg total) by mouth daily. 90 tablet 3  . azelastine (ASTELIN) 0.1 % nasal spray Place 1 spray into both nostrils 2 (two) times daily. Use in each nostril as directed 30 mL 12  . chlorpheniramine-HYDROcodone (TUSSIONEX PENNKINETIC ER) 10-8 MG/5ML SUER Take 5 mLs by mouth every 12 (twelve) hours as needed for cough. 140 mL 0  . cyanocobalamin 2000 MCG tablet Take 1 tablet (2,000 mcg total) by mouth daily. 90 tablet 3  . methylPREDNISolone (MEDROL DOSEPAK) 4 MG TBPK tablet Take as directed 21 tablet 0   No  facility-administered medications prior to visit.    ROS Review of Systems  Constitutional: Negative.  Negative for fever, chills, diaphoresis, activity change, appetite change, fatigue and unexpected weight change.  HENT: Negative.  Negative for congestion, sinus pressure, sore throat and trouble swallowing.   Eyes: Negative.   Respiratory: Positive for shortness of breath. Negative for cough, choking, chest tightness, wheezing and stridor.   Cardiovascular: Positive for palpitations. Negative for chest pain and leg swelling.  Gastrointestinal: Negative.  Negative for nausea, vomiting, abdominal pain, diarrhea, constipation and blood in stool.  Endocrine: Negative.   Genitourinary: Negative.   Musculoskeletal: Negative.  Negative for back pain and joint swelling.  Skin: Negative.   Neurological: Positive for light-headedness. Negative for dizziness, syncope, speech difficulty, weakness and numbness.  Hematological: Negative.  Negative for adenopathy. Does not bruise/bleed easily.  Psychiatric/Behavioral: Positive for dysphoric mood. Negative for hallucinations, confusion, sleep disturbance and decreased concentration. The patient is nervous/anxious. The patient is not hyperactive.     Objective:  BP 126/70 mmHg  Pulse 58  Temp(Src) 97.8 F (36.6 C)  Resp 16  Ht 5' (1.524 m)  Wt 151 lb (68.493 kg)  BMI 29.49 kg/m2  SpO2 95%  BP Readings from Last 3 Encounters:  11/06/14 126/70  05/14/14 118/78  04/21/14 134/78    Wt Readings from Last 3 Encounters:  11/06/14 151 lb (68.493 kg)  05/14/14 154 lb (69.854 kg)  04/21/14 154 lb (69.854 kg)    Physical Exam  Constitutional: She is oriented to person, place, and time. She appears well-developed and well-nourished. No distress.  HENT:  Head: Normocephalic and atraumatic.  Mouth/Throat: Oropharynx is clear and moist. No oropharyngeal exudate.  Eyes: Conjunctivae are normal. Right eye exhibits no discharge. Left eye exhibits no  discharge. No scleral icterus.  Neck: Normal range of motion. Neck supple. No JVD present. No tracheal deviation present. No thyromegaly present.  Cardiovascular: Normal rate, regular rhythm, normal heart sounds and intact distal pulses.  Exam reveals no gallop and no friction rub.   No murmur heard. EKG - sinus bradycardia with old anterior changes, no changes compared to EKG of 2 years ago  Pulmonary/Chest: Effort normal and breath sounds normal. No stridor. No respiratory distress. She has no wheezes. She has no rales. She exhibits no tenderness.  Abdominal: Soft. Bowel sounds are normal. She exhibits no distension and no mass. There is no tenderness. There is no rebound and no guarding.  Musculoskeletal: Normal range of motion. She exhibits no edema.  Lymphadenopathy:    She has no cervical adenopathy.  Neurological: She is oriented to person, place, and time.  Skin: Skin is warm and dry. No rash noted. She is not diaphoretic. No erythema. No pallor.  Psychiatric: She has a normal mood and affect. Her behavior is normal. Judgment and thought content normal.  Vitals reviewed.   Lab Results  Component Value Date   WBC 5.7 04/08/2014   HGB 13.0 04/08/2014   HCT 37.9 04/08/2014   PLT 287.0 04/08/2014   GLUCOSE 92 11/06/2014   CHOL 199 09/02/2013   TRIG 111.0 09/02/2013   HDL 40.30 09/02/2013   LDLDIRECT 148.3 09/01/2010   LDLCALC 137* 09/02/2013   ALT 20 09/02/2013   AST 25 09/02/2013   NA 139 11/06/2014   K 4.1 11/06/2014   CL 102 11/06/2014   CREATININE 0.74 11/06/2014   BUN 23 11/06/2014   CO2 29 11/06/2014   TSH 0.74 11/06/2014   HGBA1C 5.5 10/15/2012    Mm Screening Breast Tomo Bilateral  10/31/2014  CLINICAL DATA:  Screening. EXAM: DIGITAL SCREENING BILATERAL MAMMOGRAM WITH 3D TOMO WITH CAD COMPARISON:  Previous exam(s). ACR Breast Density Category c: The breast tissue is heterogeneously dense, which may obscure small masses. FINDINGS: There are no findings suspicious  for malignancy. Images were processed with CAD. IMPRESSION: No mammographic evidence of malignancy. A result letter of this screening mammogram will be mailed directly to the patient. RECOMMENDATION: Screening mammogram in one year. (Code:SM-B-01Y) BI-RADS CATEGORY  1: Negative. Electronically Signed   By: Lovey Newcomer M.D.   On: 10/31/2014 09:49    Assessment & Plan:   Kinzly was seen today for palpitations.  Diagnoses and all orders for this visit:  Palpitations- the symptoms are benign and her EKG is normal, I will check a few labs to check for thyroid-related symptoms and will look at her lytes as well, I have ordered a 48 hr Holter to see if there is a significant dysrhythmia to explain this -     Holter monitor - 48 hour; Future -     EKG 12-Lead -     TSH; Future  Need for vaccination with 13-polyvalent pneumococcal conjugate vaccine -     Pneumococcal conjugate vaccine 13-valent  Essential hypertension - her BP is well controlled, lytes and renal function are stable -     TSH; Future -  Basic metabolic panel; Future  Other specified hypothyroidism- her TSh is on the normal range, she will stay on the current synthroid dose -     TSH; Future  I have discontinued Ms. Tryon cyanocobalamin, azelastine, methylPREDNISolone, and chlorpheniramine-HYDROcodone. I am also having her maintain her guaiFENesin, multivitamin, Vitamin D3, FLUoxetine, ALPRAZolam, montelukast, levothyroxine, fluticasone, amLODipine, and valsartan.  No orders of the defined types were placed in this encounter.     Follow-up: Return in about 2 months (around 01/06/2015).  [name], MD

## 2014-12-09 ENCOUNTER — Telehealth: Payer: Self-pay | Admitting: Internal Medicine

## 2014-12-09 NOTE — Telephone Encounter (Signed)
[place] [facility]  Patient Name: [name]  DOB: [birth year]    Initial Comment Caller states she has had a cold for over a week, cough. Wants to know what she can take besides Mucinex, and Tylenol.   Nurse Assessment  Nurse: Roosvelt Maser, RN, Barnetta Chapel Date/Time (Eastern Time): 12/09/2014 9:20:30 AM  Confirm and document reason for call. If symptomatic, describe symptoms. ---caller stytaes cough and cold fir a week and achey today , no known fever.  Has the patient traveled out of the country within the last 30 days? ---Not Applicable  Does the patient have any new or worsening symptoms? ---Yes  Will a triage be completed? ---Yes  Related visit to physician within the last 2 weeks? ---No  Does the PT have any chronic conditions? (i.e. diabetes, asthma, etc.) ---Unknown  Is this a behavioral health call? ---No    Nurse: Roosvelt Maser, RN, Barnetta Chapel Date/Time (Eastern Time): 12/09/2014 9:26:15 AM  Confirm and document reason for call. If symptomatic, describe symptoms. ---caller states she has had a cough and congestion for a week, no known fever, has dr appt tomorrow, was just calling to see what OTC meds are advised  Has the patient traveled out of the country within the last 30 days? ---Not Applicable  Does the patient have any new or worsening symptoms? ---Yes  Will a triage be completed? ---Yes  Related visit to physician within the last 2 weeks? ---No  Does the PT have any chronic conditions? (i.e. diabetes, asthma, etc.) ---Unknown  Is this a behavioral health call? ---No     Guidelines    Guideline Title Affirmed Question Affirmed Notes  Common Cold Cold with no complications (all triage questions negative)    Final Disposition User   [place], RN, Barnetta Chapel    Comments  NOTE: Nurse Roosvelt Maser triaged [name]; unable to access epic at this time; Nurse Wynetta Emery documented her call into epic.    Disagree/Comply: Comply

## 2014-12-10 ENCOUNTER — Encounter: Payer: Self-pay | Admitting: Internal Medicine

## 2014-12-10 ENCOUNTER — Ambulatory Visit (INDEPENDENT_AMBULATORY_CARE_PROVIDER_SITE_OTHER): Payer: Medicare Other | Admitting: Internal Medicine

## 2014-12-10 VITALS — BP 120/68 | HR 61 | Temp 98.7°F | Wt 151.0 lb

## 2014-12-10 DIAGNOSIS — J440 Chronic obstructive pulmonary disease with acute lower respiratory infection: Secondary | ICD-10-CM | POA: Insufficient documentation

## 2014-12-10 MED ORDER — PROMETHAZINE-CODEINE 6.25-10 MG/5ML PO SYRP: 5.0000 mL | ORAL_SOLUTION | ORAL | Status: DC | PRN

## 2014-12-10 MED ORDER — AZITHROMYCIN 250 MG PO TABS: ORAL_TABLET | ORAL | Status: DC

## 2014-12-10 NOTE — Progress Notes (Signed)
Subjective:  Patient ID: Theresa Mejia, female    DOB: 01-16-1940  Age: 74 y.o. MRN: [id number]  CC: No chief complaint on file.   HPI Theresa Mejia presents for URI sx's x 1 wk - worse  Outpatient Prescriptions Prior to Visit  Medication Sig Dispense Refill  . ALPRAZolam (XANAX) 0.25 MG tablet Take 1 tablet (0.25 mg total) by mouth 2 (two) times daily as needed for anxiety. 60 tablet 3  . amLODipine (NORVASC) 10 MG tablet Take 1 tablet (10 mg total) by mouth daily. 90 tablet 3  . Cholecalciferol (VITAMIN D3) 2000 UNITS TABS Take by mouth daily.    [name] FLUoxetine (PROZAC) 40 MG capsule Take 1 capsule (40 mg total) by mouth daily. 30 capsule 11  . fluticasone (FLONASE) 50 MCG/ACT nasal spray USE TWO SPRAYS IN EACH NOSTRIL DAILY 16 g 3  . guaiFENesin (MUCINEX) 600 MG 12 hr tablet Take 1,200 mg by mouth as needed.     [name] levothyroxine (SYNTHROID, LEVOTHROID) 125 MCG tablet TAKE ONE TABLET BY MOUTH ONE TIME DAILY 90 tablet 1  . Multiple Vitamin (MULTIVITAMIN) capsule Take 1 capsule by mouth daily.      . valsartan (DIOVAN) 160 MG tablet Take 1 tablet (160 mg total) by mouth daily. 90 tablet 3  . montelukast (SINGULAIR) 10 MG tablet Take 1 tablet (10 mg total) by mouth at bedtime. (Patient not taking: Reported on 12/10/2014) 30 tablet 3   No facility-administered medications prior to visit.    ROS Review of Systems  Constitutional: Negative for chills, activity change, appetite change, fatigue and unexpected weight change.  HENT: Negative for congestion, mouth sores and sinus pressure.   Eyes: Negative for visual disturbance.  Respiratory: Negative for cough and chest tightness.   Gastrointestinal: Negative for nausea and abdominal pain.  Genitourinary: Negative for frequency, difficulty urinating and vaginal pain.  Musculoskeletal: Negative for back pain and gait problem.  Skin: Negative for pallor and rash.  Neurological: Negative for dizziness, tremors, weakness, numbness and  headaches.  Psychiatric/Behavioral: Negative for confusion and sleep disturbance.    Objective:  BP 120/68 mmHg  Pulse 61  Temp(Src) 98.7 F (37.1 C) (Oral)  Wt 151 lb (68.493 kg)  SpO2 95%  BP Readings from Last 3 Encounters:  12/10/14 120/68  11/06/14 126/70  05/14/14 118/78    Wt Readings from Last 3 Encounters:  12/10/14 151 lb (68.493 kg)  11/06/14 151 lb (68.493 kg)  05/14/14 154 lb (69.854 kg)    Physical Exam  Constitutional: She appears well-developed. No distress.  HENT:  Head: Normocephalic.  Right Ear: External ear normal.  Left Ear: External ear normal.  Nose: Nose normal.  Mouth/Throat: Oropharynx is clear and moist.  Eyes: Conjunctivae are normal. Pupils are equal, round, and reactive to light. Right eye exhibits no discharge. Left eye exhibits no discharge.  Neck: Normal range of motion. Neck supple. No JVD present. No tracheal deviation present. No thyromegaly present.  Cardiovascular: Normal rate, regular rhythm and normal heart sounds.   Pulmonary/Chest: No stridor. No respiratory distress. She has no wheezes.  Abdominal: Soft. Bowel sounds are normal. She exhibits no distension and no mass. There is no tenderness. There is no rebound and no guarding.  Musculoskeletal: She exhibits no edema or tenderness.  Lymphadenopathy:    She has no cervical adenopathy.  Neurological: She displays normal reflexes. No cranial nerve deficit. She exhibits normal muscle tone. Coordination normal.  Skin: No rash noted. No erythema.  Psychiatric: She has  a normal mood and affect. Her behavior is normal. Judgment and thought content normal.    Lab Results  Component Value Date   WBC 5.7 04/08/2014   HGB 13.0 04/08/2014   HCT 37.9 04/08/2014   PLT 287.0 04/08/2014   GLUCOSE 92 11/06/2014   CHOL 199 09/02/2013   TRIG 111.0 09/02/2013   HDL 40.30 09/02/2013   LDLDIRECT 148.3 09/01/2010   LDLCALC 137* 09/02/2013   ALT 20 09/02/2013   AST 25 09/02/2013   NA 139  11/06/2014   K 4.1 11/06/2014   CL 102 11/06/2014   CREATININE 0.74 11/06/2014   BUN 23 11/06/2014   CO2 29 11/06/2014   TSH 0.74 11/06/2014   HGBA1C 5.5 10/15/2012    Mm Screening Breast Tomo Bilateral  10/31/2014  CLINICAL DATA:  Screening. EXAM: DIGITAL SCREENING BILATERAL MAMMOGRAM WITH 3D TOMO WITH CAD COMPARISON:  Previous exam(s). ACR Breast Density Category c: The breast tissue is heterogeneously dense, which may obscure small masses. FINDINGS: There are no findings suspicious for malignancy. Images were processed with CAD. IMPRESSION: No mammographic evidence of malignancy. A result letter of this screening mammogram will be mailed directly to the patient. RECOMMENDATION: Screening mammogram in one year. (Code:SM-B-01Y) BI-RADS CATEGORY  1: Negative. Electronically Signed   By: Lovey Newcomer M.D.   On: 10/31/2014 09:49    Assessment & Plan:   Diagnoses and all orders for this visit:  Bronchitis, chronic obstructive w acute bronchitis ([place])  Other orders -     promethazine-codeine (PHENERGAN WITH CODEINE) 6.25-10 MG/5ML syrup; Take 5 mLs by mouth every 4 (four) hours as needed. -     azithromycin (ZITHROMAX) 250 MG tablet; As directed   I am having Theresa Mejia start on promethazine-codeine and azithromycin. I am also having her maintain her guaiFENesin, multivitamin, Vitamin D3, FLUoxetine, ALPRAZolam, montelukast, levothyroxine, fluticasone, amLODipine, valsartan, and loratadine.  Meds ordered this encounter  Medications  . loratadine (CLARITIN) 10 MG tablet    Sig: Take 10 mg by mouth daily as needed.  . promethazine-codeine (PHENERGAN WITH CODEINE) 6.25-10 MG/5ML syrup    Sig: Take 5 mLs by mouth every 4 (four) hours as needed.    Dispense:  300 mL    Refill:  0  . azithromycin (ZITHROMAX) 250 MG tablet    Sig: As directed    Dispense:  6 tablet    Refill:  0     Follow-up: No Follow-up on file.  Walker Kehr, MD

## 2014-12-10 NOTE — Progress Notes (Signed)
Pre visit review using our clinic review tool, if applicable. No additional management support is needed unless otherwise documented below in the visit note. 

## 2014-12-10 NOTE — Assessment & Plan Note (Signed)
11/16 (remote smoking in the 72s) Zpac Prom-cod syr Pt declined CXR

## 2015-01-30 DIAGNOSIS — Z961 Presence of intraocular lens: Secondary | ICD-10-CM | POA: Diagnosis not present

## 2015-01-30 DIAGNOSIS — H04123 Dry eye syndrome of bilateral lacrimal glands: Secondary | ICD-10-CM | POA: Diagnosis not present

## 2015-02-05 DIAGNOSIS — Z1211 Encounter for screening for malignant neoplasm of colon: Secondary | ICD-10-CM | POA: Diagnosis not present

## 2015-02-05 DIAGNOSIS — D122 Benign neoplasm of ascending colon: Secondary | ICD-10-CM | POA: Diagnosis not present

## 2015-02-05 DIAGNOSIS — K635 Polyp of colon: Secondary | ICD-10-CM | POA: Diagnosis not present

## 2015-02-05 LAB — HM COLONOSCOPY

## 2015-02-08 ENCOUNTER — Other Ambulatory Visit: Payer: Self-pay | Admitting: Internal Medicine

## 2015-02-26 ENCOUNTER — Ambulatory Visit (INDEPENDENT_AMBULATORY_CARE_PROVIDER_SITE_OTHER): Payer: PPO

## 2015-02-26 DIAGNOSIS — R002 Palpitations: Secondary | ICD-10-CM | POA: Diagnosis not present

## 2015-03-04 ENCOUNTER — Other Ambulatory Visit: Payer: Self-pay | Admitting: Internal Medicine

## 2015-03-06 ENCOUNTER — Encounter: Payer: Self-pay | Admitting: Internal Medicine

## 2015-03-10 ENCOUNTER — Other Ambulatory Visit: Payer: Self-pay | Admitting: Internal Medicine

## 2015-03-24 DIAGNOSIS — L570 Actinic keratosis: Secondary | ICD-10-CM | POA: Diagnosis not present

## 2015-03-24 DIAGNOSIS — L821 Other seborrheic keratosis: Secondary | ICD-10-CM | POA: Diagnosis not present

## 2015-03-24 DIAGNOSIS — D225 Melanocytic nevi of trunk: Secondary | ICD-10-CM | POA: Diagnosis not present

## 2015-03-24 DIAGNOSIS — L918 Other hypertrophic disorders of the skin: Secondary | ICD-10-CM | POA: Diagnosis not present

## 2015-03-30 DIAGNOSIS — H02403 Unspecified ptosis of bilateral eyelids: Secondary | ICD-10-CM | POA: Diagnosis not present

## 2015-05-13 ENCOUNTER — Telehealth: Payer: Self-pay | Admitting: Internal Medicine

## 2015-05-13 ENCOUNTER — Other Ambulatory Visit: Payer: Self-pay | Admitting: Internal Medicine

## 2015-05-13 DIAGNOSIS — H02403 Unspecified ptosis of bilateral eyelids: Secondary | ICD-10-CM | POA: Diagnosis not present

## 2015-05-13 DIAGNOSIS — H04123 Dry eye syndrome of bilateral lacrimal glands: Secondary | ICD-10-CM | POA: Diagnosis not present

## 2015-05-13 NOTE — Telephone Encounter (Signed)
Left patient a vm to call back to make a med fu.

## 2015-06-04 ENCOUNTER — Other Ambulatory Visit (INDEPENDENT_AMBULATORY_CARE_PROVIDER_SITE_OTHER): Payer: PPO

## 2015-06-04 ENCOUNTER — Encounter: Payer: Self-pay | Admitting: Internal Medicine

## 2015-06-04 ENCOUNTER — Ambulatory Visit (INDEPENDENT_AMBULATORY_CARE_PROVIDER_SITE_OTHER): Payer: PPO | Admitting: Internal Medicine

## 2015-06-04 VITALS — BP 130/78 | HR 60 | Temp 98.3°F | Resp 16 | Ht 60.0 in | Wt 152.0 lb

## 2015-06-04 DIAGNOSIS — G4700 Insomnia, unspecified: Secondary | ICD-10-CM | POA: Insufficient documentation

## 2015-06-04 DIAGNOSIS — G4733 Obstructive sleep apnea (adult) (pediatric): Secondary | ICD-10-CM | POA: Diagnosis not present

## 2015-06-04 DIAGNOSIS — Z0000 Encounter for general adult medical examination without abnormal findings: Secondary | ICD-10-CM

## 2015-06-04 DIAGNOSIS — I10 Essential (primary) hypertension: Secondary | ICD-10-CM | POA: Diagnosis not present

## 2015-06-04 DIAGNOSIS — E038 Other specified hypothyroidism: Secondary | ICD-10-CM

## 2015-06-04 DIAGNOSIS — G4730 Sleep apnea, unspecified: Secondary | ICD-10-CM

## 2015-06-04 DIAGNOSIS — Z1159 Encounter for screening for other viral diseases: Secondary | ICD-10-CM | POA: Insufficient documentation

## 2015-06-04 DIAGNOSIS — F418 Other specified anxiety disorders: Secondary | ICD-10-CM

## 2015-06-04 DIAGNOSIS — E7800 Pure hypercholesterolemia, unspecified: Secondary | ICD-10-CM

## 2015-06-04 LAB — CBC WITH DIFFERENTIAL/PLATELET
Basophils Absolute: 0 10*3/uL (ref 0.0–0.1)
Basophils Relative: 0.5 % (ref 0.0–3.0)
EOS PCT: 1.4 % (ref 0.0–5.0)
Eosinophils Absolute: 0.1 10*3/uL (ref 0.0–0.7)
HCT: 37.5 % (ref 36.0–46.0)
Hemoglobin: 12.3 g/dL (ref 12.0–15.0)
LYMPHS ABS: 1.7 10*3/uL (ref 0.7–4.0)
LYMPHS PCT: 28 % (ref 12.0–46.0)
MCHC: 32.8 g/dL (ref 30.0–36.0)
MCV: 84.8 fl (ref 78.0–100.0)
MONOS PCT: 7.5 % (ref 3.0–12.0)
Monocytes Absolute: 0.5 10*3/uL (ref 0.1–1.0)
NEUTROS ABS: 3.8 10*3/uL (ref 1.4–7.7)
NEUTROS PCT: 62.6 % (ref 43.0–77.0)
Platelets: 318 10*3/uL (ref 150.0–400.0)
RBC: 4.42 Mil/uL (ref 3.87–5.11)
RDW: 14.1 % (ref 11.5–15.5)
WBC: 6 10*3/uL (ref 4.0–10.5)

## 2015-06-04 LAB — LIPID PANEL
CHOL/HDL RATIO: 5
Cholesterol: 218 mg/dL — ABNORMAL HIGH (ref 0–200)
HDL: 42.1 mg/dL (ref >39.00)
LDL CALC: 153 mg/dL — AB (ref 0–99)
NONHDL: 175.67
Triglycerides: 111 mg/dL (ref 0.0–149.0)
VLDL: 22.2 mg/dL (ref 0.0–40.0)

## 2015-06-04 LAB — COMPREHENSIVE METABOLIC PANEL
ALT: 13 U/L (ref 0–35)
AST: 20 U/L (ref 0–37)
Albumin: 4.7 g/dL (ref 3.5–5.2)
Alkaline Phosphatase: 48 U/L (ref 39–117)
BUN: 24 mg/dL — ABNORMAL HIGH (ref 6–23)
CALCIUM: 9.2 mg/dL (ref 8.4–10.5)
CO2: 28 meq/L (ref 19–32)
Chloride: 103 mEq/L (ref 96–112)
Creatinine, Ser: 0.85 mg/dL (ref 0.40–1.20)
GFR: 69.79 mL/min (ref >60.00)
GLUCOSE: 86 mg/dL (ref 70–99)
POTASSIUM: 3.5 meq/L (ref 3.5–5.1)
Sodium: 137 mEq/L (ref 135–145)
Total Bilirubin: 0.5 mg/dL (ref 0.2–1.2)
Total Protein: 7.5 g/dL (ref 6.0–8.3)

## 2015-06-04 LAB — TSH: TSH: 1.31 u[IU]/mL (ref 0.35–4.50)

## 2015-06-04 LAB — T4, FREE: FREE T4: 1.06 ng/dL (ref 0.60–1.60)

## 2015-06-04 LAB — T3: T3 TOTAL: 80 ng/dL (ref 76–181)

## 2015-06-04 MED ORDER — SUVOREXANT 15 MG PO TABS: 1.0000 | ORAL_TABLET | Freq: Every evening | ORAL | Status: DC | PRN

## 2015-06-04 MED ORDER — FLUTICASONE PROPIONATE 50 MCG/ACT NA SUSP: 2.0000 | Freq: Every day | NASAL | Status: DC

## 2015-06-04 MED ORDER — FLUOXETINE HCL 40 MG PO CAPS: ORAL_CAPSULE | ORAL | Status: DC

## 2015-06-04 NOTE — Progress Notes (Signed)
Pre visit review using our clinic review tool, if applicable. No additional management support is needed unless otherwise documented below in the visit note. 

## 2015-06-04 NOTE — Patient Instructions (Signed)
Preventive Care for Adults, Female A healthy lifestyle and preventive care can promote health and wellness. Preventive health guidelines for women include the following key practices.  A routine yearly physical is a good way to check with your health care provider about your health and preventive screening. It is a chance to share any concerns and updates on your health and to receive a thorough exam.  Visit your dentist for a routine exam and preventive care every 6 months. Brush your teeth twice a day and floss once a day. Good oral hygiene prevents tooth decay and gum disease.  The frequency of eye exams is based on your age, health, family medical history, use of contact lenses, and other factors. Follow your health care provider's recommendations for frequency of eye exams.  Eat a healthy diet. Foods like vegetables, fruits, whole grains, low-fat dairy products, and lean protein foods contain the nutrients you need without too many calories. Decrease your intake of foods high in solid fats, added sugars, and salt. Eat the right amount of calories for you.Get information about a proper diet from your health care provider, if necessary.  Regular physical exercise is one of the most important things you can do for your health. Most adults should get at least 150 minutes of moderate-intensity exercise (any activity that increases your heart rate and causes you to sweat) each week. In addition, most adults need muscle-strengthening exercises on 2 or more days a week.  Maintain a healthy weight. The body mass index (BMI) is a screening tool to identify possible weight problems. It provides an estimate of body fat based on height and weight. Your health care provider can find your BMI and can help you achieve or maintain a healthy weight.For adults 20 years and older:  A BMI below 18.5 is considered underweight.  A BMI of 18.5 to 24.9 is normal.  A BMI of 25 to 29.9 is considered overweight.  A  BMI of 30 and above is considered obese.  Maintain normal blood lipids and cholesterol levels by exercising and minimizing your intake of saturated fat. Eat a balanced diet with plenty of fruit and vegetables. Blood tests for lipids and cholesterol should begin at age 45 and be repeated every 5 years. If your lipid or cholesterol levels are high, you are over 50, or you are at high risk for heart disease, you may need your cholesterol levels checked more frequently.Ongoing high lipid and cholesterol levels should be treated with medicines if diet and exercise are not working.  If you smoke, find out from your health care provider how to quit. If you do not use tobacco, do not start.  Lung cancer screening is recommended for adults aged 45-80 years who are at high risk for developing lung cancer because of a history of smoking. A yearly low-dose CT scan of the lungs is recommended for people who have at least a 30-pack-year history of smoking and are a current smoker or have quit within the past 15 years. A pack year of smoking is smoking an average of 1 pack of cigarettes a day for 1 year (for example: 1 pack a day for 30 years or 2 packs a day for 15 years). Yearly screening should continue until the smoker has stopped smoking for at least 15 years. Yearly screening should be stopped for people who develop a health problem that would prevent them from having lung cancer treatment.  If you are pregnant, do not drink alcohol. If you are  breastfeeding, be very cautious about drinking alcohol. If you are not pregnant and choose to drink alcohol, do not have more than 1 drink per day. One drink is considered to be 12 ounces (355 mL) of beer, 5 ounces (148 mL) of wine, or 1.5 ounces (44 mL) of liquor.  Avoid use of street drugs. Do not share needles with anyone. Ask for help if you need support or instructions about stopping the use of drugs.  High blood pressure causes heart disease and increases the risk  of stroke. Your blood pressure should be checked at least every 1 to 2 years. Ongoing high blood pressure should be treated with medicines if weight loss and exercise do not work.  If you are 55-79 years old, ask your health care provider if you should take aspirin to prevent strokes.  Diabetes screening is done by taking a blood sample to check your blood glucose level after you have not eaten for a certain period of time (fasting). If you are not overweight and you do not have risk factors for diabetes, you should be screened once every 3 years starting at age 45. If you are overweight or obese and you are 40-70 years of age, you should be screened for diabetes every year as part of your cardiovascular risk assessment.  Breast cancer screening is essential preventive care for women. You should practice "breast self-awareness." This means understanding the normal appearance and feel of your breasts and may include breast self-examination. Any changes detected, no matter how small, should be reported to a health care provider. Women in their 20s and 30s should have a clinical breast exam (CBE) by a health care provider as part of a regular health exam every 1 to 3 years. After age 40, women should have a CBE every year. Starting at age 40, women should consider having a mammogram (breast X-ray test) every year. Women who have a family history of breast cancer should talk to their health care provider about genetic screening. Women at a high risk of breast cancer should talk to their health care providers about having an MRI and a mammogram every year.  Breast cancer gene (BRCA)-related cancer risk assessment is recommended for women who have family members with BRCA-related cancers. BRCA-related cancers include breast, ovarian, tubal, and peritoneal cancers. Having family members with these cancers may be associated with an increased risk for harmful changes (mutations) in the breast cancer genes BRCA1 and  BRCA2. Results of the assessment will determine the need for genetic counseling and BRCA1 and BRCA2 testing.  Your health care provider may recommend that you be screened regularly for cancer of the pelvic organs (ovaries, uterus, and vagina). This screening involves a pelvic examination, including checking for microscopic changes to the surface of your cervix (Pap test). You may be encouraged to have this screening done every 3 years, beginning at age 21.  For women ages 30-65, health care providers may recommend pelvic exams and Pap testing every 3 years, or they may recommend the Pap and pelvic exam, combined with testing for human papilloma virus (HPV), every 5 years. Some types of HPV increase your risk of cervical cancer. Testing for HPV may also be done on women of any age with unclear Pap test results.  Other health care providers may not recommend any screening for nonpregnant women who are considered low risk for pelvic cancer and who do not have symptoms. Ask your health care provider if a screening pelvic exam is right for   you.  If you have had past treatment for cervical cancer or a condition that could lead to cancer, you need Pap tests and screening for cancer for at least 20 years after your treatment. If Pap tests have been discontinued, your risk factors (such as having a new sexual partner) need to be reassessed to determine if screening should resume. Some women have medical problems that increase the chance of getting cervical cancer. In these cases, your health care provider may recommend more frequent screening and Pap tests.  Colorectal cancer can be detected and often prevented. Most routine colorectal cancer screening begins at the age of 50 years and continues through age 75 years. However, your health care provider may recommend screening at an earlier age if you have risk factors for colon cancer. On a yearly basis, your health care provider may provide home test kits to check  for hidden blood in the stool. Use of a small camera at the end of a tube, to directly examine the colon (sigmoidoscopy or colonoscopy), can detect the earliest forms of colorectal cancer. Talk to your health care provider about this at age 50, when routine screening begins. Direct exam of the colon should be repeated every 5-10 years through age 75 years, unless early forms of precancerous polyps or small growths are found.  People who are at an increased risk for hepatitis B should be screened for this virus. You are considered at high risk for hepatitis B if:  You were born in a country where hepatitis B occurs often. Talk with your health care provider about which countries are considered high risk.  Your parents were born in a high-risk country and you have not received a shot to protect against hepatitis B (hepatitis B vaccine).  You have HIV or AIDS.  You use needles to inject street drugs.  You live with, or have sex with, someone who has hepatitis B.  You get hemodialysis treatment.  You take certain medicines for conditions like cancer, organ transplantation, and autoimmune conditions.  Hepatitis C blood testing is recommended for all people born from 1945 through 1965 and any individual with known risks for hepatitis C.  Practice safe sex. Use condoms and avoid high-risk sexual practices to reduce the spread of sexually transmitted infections (STIs). STIs include gonorrhea, chlamydia, syphilis, trichomonas, herpes, HPV, and human immunodeficiency virus (HIV). Herpes, HIV, and HPV are viral illnesses that have no cure. They can result in disability, cancer, and death.  You should be screened for sexually transmitted illnesses (STIs) including gonorrhea and chlamydia if:  You are sexually active and are younger than 24 years.  You are older than 24 years and your health care provider tells you that you are at risk for this type of infection.  Your sexual activity has changed  since you were last screened and you are at an increased risk for chlamydia or gonorrhea. Ask your health care provider if you are at risk.  If you are at risk of being infected with HIV, it is recommended that you take a prescription medicine daily to prevent HIV infection. This is called preexposure prophylaxis (PrEP). You are considered at risk if:  You are sexually active and do not regularly use condoms or know the HIV status of your partner(s).  You take drugs by injection.  You are sexually active with a partner who has HIV.  Talk with your health care provider about whether you are at high risk of being infected with HIV. If   you choose to begin PrEP, you should first be tested for HIV. You should then be tested every 3 months for as long as you are taking PrEP.  Osteoporosis is a disease in which the bones lose minerals and strength with aging. This can result in serious bone fractures or breaks. The risk of osteoporosis can be identified using a bone density scan. Women ages 67 years and over and women at risk for fractures or osteoporosis should discuss screening with their health care providers. Ask your health care provider whether you should take a calcium supplement or vitamin D to reduce the rate of osteoporosis.  Menopause can be associated with physical symptoms and risks. Hormone replacement therapy is available to decrease symptoms and risks. You should talk to your health care provider about whether hormone replacement therapy is right for you.  Use sunscreen. Apply sunscreen liberally and repeatedly throughout the day. You should seek shade when your shadow is shorter than you. Protect yourself by wearing long sleeves, pants, a wide-brimmed hat, and sunglasses year round, whenever you are outdoors.  Once a month, do a whole body skin exam, using a mirror to look at the skin on your back. Tell your health care provider of new moles, moles that have irregular borders, moles that  are larger than a pencil eraser, or moles that have changed in shape or color.  Stay current with required vaccines (immunizations).  Influenza vaccine. All adults should be immunized every year.  Tetanus, diphtheria, and acellular pertussis (Td, Tdap) vaccine. Pregnant women should receive 1 dose of Tdap vaccine during each pregnancy. The dose should be obtained regardless of the length of time since the last dose. Immunization is preferred during the 27th-36th week of gestation. An adult who has not previously received Tdap or who does not know her vaccine status should receive 1 dose of Tdap. This initial dose should be followed by tetanus and diphtheria toxoids (Td) booster doses every 10 years. Adults with an unknown or incomplete history of completing a 3-dose immunization series with Td-containing vaccines should begin or complete a primary immunization series including a Tdap dose. Adults should receive a Td booster every 10 years.  Varicella vaccine. An adult without evidence of immunity to varicella should receive 2 doses or a second dose if she has previously received 1 dose. Pregnant females who do not have evidence of immunity should receive the first dose after pregnancy. This first dose should be obtained before leaving the health care facility. The second dose should be obtained 4-8 weeks after the first dose.  Human papillomavirus (HPV) vaccine. Females aged 13-26 years who have not received the vaccine previously should obtain the 3-dose series. The vaccine is not recommended for use in pregnant females. However, pregnancy testing is not needed before receiving a dose. If a female is found to be pregnant after receiving a dose, no treatment is needed. In that case, the remaining doses should be delayed until after the pregnancy. Immunization is recommended for any person with an immunocompromised condition through the age of 61 years if she did not get any or all doses earlier. During the  3-dose series, the second dose should be obtained 4-8 weeks after the first dose. The third dose should be obtained 24 weeks after the first dose and 16 weeks after the second dose.  Zoster vaccine. One dose is recommended for adults aged 30 years or older unless certain conditions are present.  Measles, mumps, and rubella (MMR) vaccine. Adults born  before 1957 generally are considered immune to measles and mumps. Adults born in 1957 or later should have 1 or more doses of MMR vaccine unless there is a contraindication to the vaccine or there is laboratory evidence of immunity to each of the three diseases. A routine second dose of MMR vaccine should be obtained at least 28 days after the first dose for students attending postsecondary schools, health care workers, or international travelers. People who received inactivated measles vaccine or an unknown type of measles vaccine during 1963-1967 should receive 2 doses of MMR vaccine. People who received inactivated mumps vaccine or an unknown type of mumps vaccine before 1979 and are at high risk for mumps infection should consider immunization with 2 doses of MMR vaccine. For females of childbearing age, rubella immunity should be determined. If there is no evidence of immunity, females who are not pregnant should be vaccinated. If there is no evidence of immunity, females who are pregnant should delay immunization until after pregnancy. Unvaccinated health care workers born before 1957 who lack laboratory evidence of measles, mumps, or rubella immunity or laboratory confirmation of disease should consider measles and mumps immunization with 2 doses of MMR vaccine or rubella immunization with 1 dose of MMR vaccine.  Pneumococcal 13-valent conjugate (PCV13) vaccine. When indicated, a person who is uncertain of his immunization history and has no record of immunization should receive the PCV13 vaccine. All adults 65 years of age and older should receive this  vaccine. An adult aged 19 years or older who has certain medical conditions and has not been previously immunized should receive 1 dose of PCV13 vaccine. This PCV13 should be followed with a dose of pneumococcal polysaccharide (PPSV23) vaccine. Adults who are at high risk for pneumococcal disease should obtain the PPSV23 vaccine at least 8 weeks after the dose of PCV13 vaccine. Adults older than 73 years of age who have normal immune system function should obtain the PPSV23 vaccine dose at least 1 year after the dose of PCV13 vaccine.  Pneumococcal polysaccharide (PPSV23) vaccine. When PCV13 is also indicated, PCV13 should be obtained first. All adults aged 65 years and older should be immunized. An adult younger than age 65 years who has certain medical conditions should be immunized. Any person who resides in a nursing home or long-term care facility should be immunized. An adult smoker should be immunized. People with an immunocompromised condition and certain other conditions should receive both PCV13 and PPSV23 vaccines. People with human immunodeficiency virus (HIV) infection should be immunized as soon as possible after diagnosis. Immunization during chemotherapy or radiation therapy should be avoided. Routine use of PPSV23 vaccine is not recommended for American Indians, [place] Natives, or people younger than 65 years unless there are medical conditions that require PPSV23 vaccine. When indicated, people who have unknown immunization and have no record of immunization should receive PPSV23 vaccine. One-time revaccination 5 years after the first dose of PPSV23 is recommended for people aged 19-64 years who have chronic kidney failure, nephrotic syndrome, asplenia, or immunocompromised conditions. People who received 1-2 doses of PPSV23 before age 65 years should receive another dose of PPSV23 vaccine at age 65 years or later if at least 5 years have passed since the previous dose. Doses of PPSV23 are not  needed for people immunized with PPSV23 at or after age 65 years.  Meningococcal vaccine. Adults with asplenia or persistent complement component deficiencies should receive 2 doses of quadrivalent meningococcal conjugate (MenACWY-D) vaccine. The doses should be obtained   at least 2 months apart. Microbiologists working with certain meningococcal bacteria, [place] recruits, people at risk during an outbreak, and people who travel to or live in countries with a high rate of meningitis should be immunized. A first-year college student up through age 34 years who is living in a residence hall should receive a dose if she did not receive a dose on or after her 16th birthday. Adults who have certain high-risk conditions should receive one or more doses of vaccine.  Hepatitis A vaccine. Adults who wish to be protected from this disease, have certain high-risk conditions, work with hepatitis A-infected animals, work in hepatitis A research labs, or travel to or work in countries with a high rate of hepatitis A should be immunized. Adults who were previously unvaccinated and who anticipate close contact with an international adoptee during the first 60 days after arrival in the [place] from a country with a high rate of hepatitis A should be immunized.  Hepatitis B vaccine. Adults who wish to be protected from this disease, have certain high-risk conditions, may be exposed to blood or other infectious body fluids, are household contacts or sex partners of hepatitis B positive people, are clients or workers in certain care facilities, or travel to or work in countries with a high rate of hepatitis B should be immunized.  Haemophilus influenzae type b (Hib) vaccine. A previously unvaccinated person with asplenia or sickle cell disease or having a scheduled splenectomy should receive 1 dose of Hib vaccine. Regardless of previous immunization, a recipient of a hematopoietic stem cell transplant should receive a  3-dose series 6-12 months after her successful transplant. Hib vaccine is not recommended for adults with HIV infection. Preventive Services / Frequency Ages 35 to 4 years  Blood pressure check.** / Every 3-5 years.  Lipid and cholesterol check.** / Every 5 years beginning at age 60.  Clinical breast exam.** / Every 3 years for women in their 71s and 10s.  BRCA-related cancer risk assessment.** / For women who have family members with a BRCA-related cancer (breast, ovarian, tubal, or peritoneal cancers).  Pap test.** / Every 2 years from ages 76 through 26. Every 3 years starting at age 61 through age 76 or 93 with a history of 3 consecutive normal Pap tests.  HPV screening.** / Every 3 years from ages 37 through ages 60 to 51 with a history of 3 consecutive normal Pap tests.  Hepatitis C blood test.** / For any individual with known risks for hepatitis C.  Skin self-exam. / Monthly.  Influenza vaccine. / Every year.  Tetanus, diphtheria, and acellular pertussis (Tdap, Td) vaccine.** / Consult your health care provider. Pregnant women should receive 1 dose of Tdap vaccine during each pregnancy. 1 dose of Td every 10 years.  Varicella vaccine.** / Consult your health care provider. Pregnant females who do not have evidence of immunity should receive the first dose after pregnancy.  HPV vaccine. / 3 doses over 6 months, if 93 and younger. The vaccine is not recommended for use in pregnant females. However, pregnancy testing is not needed before receiving a dose.  Measles, mumps, rubella (MMR) vaccine.** / You need at least 1 dose of MMR if you were born in 1957 or later. You may also need a 2nd dose. For females of childbearing age, rubella immunity should be determined. If there is no evidence of immunity, females who are not pregnant should be vaccinated. If there is no evidence of immunity, females who are  pregnant should delay immunization until after pregnancy.  Pneumococcal  13-valent conjugate (PCV13) vaccine.** / Consult your health care provider.  Pneumococcal polysaccharide (PPSV23) vaccine.** / 1 to 2 doses if you smoke cigarettes or if you have certain conditions.  Meningococcal vaccine.** / 1 dose if you are age 68 to 8 years and a Market researcher living in a residence hall, or have one of several medical conditions, you need to get vaccinated against meningococcal disease. You may also need additional booster doses.  Hepatitis A vaccine.** / Consult your health care provider.  Hepatitis B vaccine.** / Consult your health care provider.  Haemophilus influenzae type b (Hib) vaccine.** / Consult your health care provider. Ages 7 to 53 years  Blood pressure check.** / Every year.  Lipid and cholesterol check.** / Every 5 years beginning at age 25 years.  Lung cancer screening. / Every year if you are aged 11-80 years and have a 30-pack-year history of smoking and currently smoke or have quit within the past 15 years. Yearly screening is stopped once you have quit smoking for at least 15 years or develop a health problem that would prevent you from having lung cancer treatment.  Clinical breast exam.** / Every year after age 48 years.  BRCA-related cancer risk assessment.** / For women who have family members with a BRCA-related cancer (breast, ovarian, tubal, or peritoneal cancers).  Mammogram.** / Every year beginning at age 41 years and continuing for as long as you are in good health. Consult with your health care provider.  Pap test.** / Every 3 years starting at age 65 years through age 37 or 70 years with a history of 3 consecutive normal Pap tests.  HPV screening.** / Every 3 years from ages 72 years through ages 60 to 40 years with a history of 3 consecutive normal Pap tests.  Fecal occult blood test (FOBT) of stool. / Every year beginning at age 21 years and continuing until age 5 years. You may not need to do this test if you get  a colonoscopy every 10 years.  Flexible sigmoidoscopy or colonoscopy.** / Every 5 years for a flexible sigmoidoscopy or every 10 years for a colonoscopy beginning at age 35 years and continuing until age 48 years.  Hepatitis C blood test.** / For all people born from [birth year] through 1965 and any individual with known risks for hepatitis C.  Skin self-exam. / Monthly.  Influenza vaccine. / Every year.  Tetanus, diphtheria, and acellular pertussis (Tdap/Td) vaccine.** / Consult your health care provider. Pregnant women should receive 1 dose of Tdap vaccine during each pregnancy. 1 dose of Td every 10 years.  Varicella vaccine.** / Consult your health care provider. Pregnant females who do not have evidence of immunity should receive the first dose after pregnancy.  Zoster vaccine.** / 1 dose for adults aged 30 years or older.  Measles, mumps, rubella (MMR) vaccine.** / You need at least 1 dose of MMR if you were born in 1957 or later. You may also need a second dose. For females of childbearing age, rubella immunity should be determined. If there is no evidence of immunity, females who are not pregnant should be vaccinated. If there is no evidence of immunity, females who are pregnant should delay immunization until after pregnancy.  Pneumococcal 13-valent conjugate (PCV13) vaccine.** / Consult your health care provider.  Pneumococcal polysaccharide (PPSV23) vaccine.** / 1 to 2 doses if you smoke cigarettes or if you have certain conditions.  Meningococcal vaccine.** /  Consult your health care provider.  Hepatitis A vaccine.** / Consult your health care provider.  Hepatitis B vaccine.** / Consult your health care provider.  Haemophilus influenzae type b (Hib) vaccine.** / Consult your health care provider. Ages 64 years and over  Blood pressure check.** / Every year.  Lipid and cholesterol check.** / Every 5 years beginning at age 23 years.  Lung cancer screening. / Every year if you  are aged 16-80 years and have a 30-pack-year history of smoking and currently smoke or have quit within the past 15 years. Yearly screening is stopped once you have quit smoking for at least 15 years or develop a health problem that would prevent you from having lung cancer treatment.  Clinical breast exam.** / Every year after age 74 years.  BRCA-related cancer risk assessment.** / For women who have family members with a BRCA-related cancer (breast, ovarian, tubal, or peritoneal cancers).  Mammogram.** / Every year beginning at age 44 years and continuing for as long as you are in good health. Consult with your health care provider.  Pap test.** / Every 3 years starting at age 58 years through age 22 or 39 years with 3 consecutive normal Pap tests. Testing can be stopped between 65 and 70 years with 3 consecutive normal Pap tests and no abnormal Pap or HPV tests in the past 10 years.  HPV screening.** / Every 3 years from ages 64 years through ages 70 or 61 years with a history of 3 consecutive normal Pap tests. Testing can be stopped between 65 and 70 years with 3 consecutive normal Pap tests and no abnormal Pap or HPV tests in the past 10 years.  Fecal occult blood test (FOBT) of stool. / Every year beginning at age 40 years and continuing until age 27 years. You may not need to do this test if you get a colonoscopy every 10 years.  Flexible sigmoidoscopy or colonoscopy.** / Every 5 years for a flexible sigmoidoscopy or every 10 years for a colonoscopy beginning at age 7 years and continuing until age 32 years.  Hepatitis C blood test.** / For all people born from [birth year] through 1963 and any individual with known risks for hepatitis C.  Osteoporosis screening.** / A one-time screening for women ages 30 years and over and women at risk for fractures or osteoporosis.  Skin self-exam. / Monthly.  Influenza vaccine. / Every year.  Tetanus, diphtheria, and acellular pertussis (Tdap/Td)  vaccine.** / 1 dose of Td every 10 years.  Varicella vaccine.** / Consult your health care provider.  Zoster vaccine.** / 1 dose for adults aged 35 years or older.  Pneumococcal 13-valent conjugate (PCV13) vaccine.** / Consult your health care provider.  Pneumococcal polysaccharide (PPSV23) vaccine.** / 1 dose for all adults aged 46 years and older.  Meningococcal vaccine.** / Consult your health care provider.  Hepatitis A vaccine.** / Consult your health care provider.  Hepatitis B vaccine.** / Consult your health care provider.  Haemophilus influenzae type b (Hib) vaccine.** / Consult your health care provider. ** Family history and personal history of risk and conditions may change your health care provider's recommendations.   This information is not intended to replace advice given to you by your health care provider. Make sure you discuss any questions you have with your health care provider.   Document Released: 02/22/2001 Document Revised: 01/17/2014 Document Reviewed: 05/24/2010 Elsevier Interactive Patient Education [employer].

## 2015-06-04 NOTE — Progress Notes (Signed)
Subjective:  Patient ID: Theresa Mejia, female    DOB: 11/02/1940  Age: 75 y.o. MRN: [id number]  CC: Annual Exam; Hypertension; Hypothyroidism; and Hyperlipidemia   HPI Theresa Mejia presents for a CPX.  She complains of worsening snoring and tells me she has a history of sleep apnea. She has significant daytime fatigue and takes frequent naps. She complains of insomnia that she describes as frequent awakenings and occasional difficulty falling asleep. She previously tried Ambien and Benadryl but had "hangover" symptoms that day after and does not want to try those again.  She tells me her blood pressure has been well controlled with amlodipine and valsartan. She has had no episodes of chest pain, shortness of breath, edema, palpitations, near syncope, or diaphoresis.  Her only symptom related to hypothyroidism is fatigue.   Past Medical History  Diagnosis Date  . Hypersomnia   . Allergic rhinitis   . Hypertension   . Hypercholesteremia   . Hypothyroidism   . DJD (degenerative joint disease)   . Spondylosis, cervical   . Headache(784.0)   . Reactive depression (situational)   . Lumbar disc disease   . OBSTRUCTIVE SLEEP APNEA     NPSG 04/29/07- AHI 1.1/hr, RDI 21.2/hr. Weight 176 lbs. CPAP was tried based on the RDI.    [name] OVERACTIVE BLADDER   . Depression   . Internal hemorrhoid 11/2008    s/p banding ([facility])   Past Surgical History  Procedure Laterality Date  . Rotator cuff surgery      Dr. [name]  . Knee arthroscopy      Dr. [name]  . Tubal ligation      reports that she quit smoking about 47 years ago. She does not have any smokeless tobacco history on file. She reports that she does not drink alcohol or use illicit drugs. family history includes Cancer (age of onset: 57) in her mother; Crohn's disease (age of onset: 64) in her father; Hypothyroidism in her mother; Macular degeneration in her mother; Parkinsonism in her maternal grandmother; Stroke (age of onset: 81)  in her mother; Tremor in her mother. Allergies  Allergen Reactions  . Ramipril     REACTION: pt states INTOLERANT to Altace  . Sulfonamide Derivatives     REACTION: hypotension  . Sulfur Other (See Comments)    Says it dropped her blood pressure really low.     Outpatient Prescriptions Prior to Visit  Medication Sig Dispense Refill  . amLODipine (NORVASC) 10 MG tablet Take 1 tablet (10 mg total) by mouth daily. 90 tablet 3  . Cholecalciferol (VITAMIN D3) 2000 UNITS TABS Take by mouth daily.    [name] levothyroxine (SYNTHROID, LEVOTHROID) 125 MCG tablet TAKE ONE TABLET BY MOUTH ONE TIME DAILY 90 tablet 1  . loratadine (CLARITIN) 10 MG tablet Take 10 mg by mouth daily as needed.    . Multiple Vitamin (MULTIVITAMIN) capsule Take 1 capsule by mouth daily.      . valsartan (DIOVAN) 160 MG tablet Take 1 tablet (160 mg total) by mouth daily. 90 tablet 3  . ALPRAZolam (XANAX) 0.25 MG tablet Take 1 tablet (0.25 mg total) by mouth 2 (two) times daily as needed for anxiety. 60 tablet 3  . FLUoxetine (PROZAC) 40 MG capsule TAKE 1 CAPSULE (40 MG TOTAL) BY MOUTH DAILY. --PATIENT NEEDS OFFICE VISIT BEFORE ANY FURTHER REFILLS 30 capsule 0  . fluticasone (FLONASE) 50 MCG/ACT nasal spray USE TWO SPRAYS IN EACH NOSTRIL DAILY 16 g 3  . guaiFENesin (MUCINEX)  600 MG 12 hr tablet Take 1,200 mg by mouth as needed. Reported on 06/04/2015    . azithromycin (ZITHROMAX) 250 MG tablet As directed 6 tablet 0  . montelukast (SINGULAIR) 10 MG tablet Take 1 tablet (10 mg total) by mouth at bedtime. (Patient not taking: Reported on 12/10/2014) 30 tablet 3  . promethazine-codeine (PHENERGAN WITH CODEINE) 6.25-10 MG/5ML syrup Take 5 mLs by mouth every 4 (four) hours as needed. 300 mL 0   No facility-administered medications prior to visit.    ROS Review of Systems  Constitutional: Positive for fatigue.  HENT: Negative.  Negative for sinus pressure, sore throat and trouble swallowing.   Eyes: Negative.  Negative for visual  disturbance.  Respiratory: Positive for apnea. Negative for cough, choking, chest tightness, shortness of breath and stridor.   Cardiovascular: Negative.  Negative for chest pain, palpitations and leg swelling.  Gastrointestinal: Negative.  Negative for nausea, vomiting, abdominal pain, diarrhea, constipation and blood in stool.  Endocrine: Negative.   Genitourinary: Negative.  Negative for difficulty urinating.  Musculoskeletal: Negative.  Negative for myalgias, back pain, joint swelling and arthralgias.  Skin: Negative.  Negative for color change and rash.  Allergic/Immunologic: Negative.   Neurological: Negative.  Negative for dizziness, speech difficulty, weakness, numbness and headaches.  Hematological: Negative.  Negative for adenopathy. Does not bruise/bleed easily.  Psychiatric/Behavioral: Positive for sleep disturbance.    Objective:  BP 130/78 mmHg  Pulse 60  Temp(Src) 98.3 F (36.8 C) (Oral)  Resp 16  Ht 5' (1.524 m)  Wt 152 lb (68.947 kg)  BMI 29.69 kg/m2  SpO2 97%  BP Readings from Last 3 Encounters:  06/04/15 130/78  12/10/14 120/68  11/06/14 126/70    Wt Readings from Last 3 Encounters:  06/04/15 152 lb (68.947 kg)  12/10/14 151 lb (68.493 kg)  11/06/14 151 lb (68.493 kg)    Physical Exam  Constitutional: She is oriented to person, place, and time. She appears well-developed and well-nourished. No distress.  HENT:  Mouth/Throat: Oropharynx is clear and moist. No oropharyngeal exudate.  Eyes: Conjunctivae are normal. Right eye exhibits no discharge. Left eye exhibits no discharge. No scleral icterus.  Neck: Normal range of motion. Neck supple. No JVD present. No tracheal deviation present. No thyromegaly present.  Cardiovascular: Normal rate, regular rhythm, normal heart sounds and intact distal pulses.  Exam reveals no gallop and no friction rub.   No murmur heard. Pulmonary/Chest: Effort normal and breath sounds normal. No stridor. No respiratory distress.  She has no wheezes. She has no rales. She exhibits no tenderness.  Abdominal: Soft. Bowel sounds are normal. She exhibits no distension and no mass. There is no tenderness. There is no rebound and no guarding.  Genitourinary:  Breast, genitourinary, and rectal exams were deferred at her request-she prefers to have these performed by a female GYN doctor  Musculoskeletal: Normal range of motion. She exhibits no edema or tenderness.  Lymphadenopathy:    She has no cervical adenopathy.  Neurological: She is oriented to person, place, and time.  Skin: Skin is warm and dry. No rash noted. She is not diaphoretic. No erythema. No pallor.  Vitals reviewed.   Lab Results  Component Value Date   WBC 6.0 06/04/2015   HGB 12.3 06/04/2015   HCT 37.5 06/04/2015   PLT 318.0 06/04/2015   GLUCOSE 86 06/04/2015   CHOL 218* 06/04/2015   TRIG 111.0 06/04/2015   HDL 42.10 06/04/2015   LDLDIRECT 148.3 09/01/2010   LDLCALC 153* 06/04/2015  ALT 13 06/04/2015   AST 20 06/04/2015   NA 137 06/04/2015   K 3.5 06/04/2015   CL 103 06/04/2015   CREATININE 0.85 06/04/2015   BUN 24* 06/04/2015   CO2 28 06/04/2015   TSH 1.31 06/04/2015   HGBA1C 5.5 10/15/2012    Mm Screening Breast Tomo Bilateral  10/31/2014  CLINICAL DATA:  Screening. EXAM: DIGITAL SCREENING BILATERAL MAMMOGRAM WITH 3D TOMO WITH CAD COMPARISON:  Previous exam(s). ACR Breast Density Category c: The breast tissue is heterogeneously dense, which may obscure small masses. FINDINGS: There are no findings suspicious for malignancy. Images were processed with CAD. IMPRESSION: No mammographic evidence of malignancy. A result letter of this screening mammogram will be mailed directly to the patient. RECOMMENDATION: Screening mammogram in one year. (Code:SM-B-01Y) BI-RADS CATEGORY  1: Negative. Electronically Signed   By: Lovey Newcomer M.D.   On: 10/31/2014 09:49    Assessment & Plan:   Theresa Mejia was seen today for annual exam, hypertension,  hypothyroidism and hyperlipidemia.  Diagnoses and all orders for this visit:  Essential hypertension- her blood pressure is well controlled, electrolytes and renal function are stable. -     Comprehensive metabolic panel; Future -     CBC with Differential/Platelet; Future -     T4, free; Future -     T3; Future  Other specified hypothyroidism- her TSH is in the normal range, she will remain on the current dose of levothyroxine -     Lipid panel; Future -     TSH; Future  HYPERCHOLESTEROLEMIA- her Framingham risk score is up to 11% so I've asked her to start low-dose statin to reduce her risk of heart attack and stroke -     Lipid panel; Future -     Comprehensive metabolic panel; Future -     TSH; Future -     T4, free; Future -     T3; Future -     atorvastatin (LIPITOR) 20 MG tablet; Take 1 tablet (20 mg total) by mouth daily.  Need for hepatitis C screening test -     Hepatitis C antibody; Future  Routine general medical examination at a health care facility -     HIV antibody; Future  Obstructive sleep apnea- I believe she is due for a follow-up visit with sleep medicine to have this reevaluated and possibly treated -     Ambulatory referral to Pulmonology  Depression with anxiety -     FLUoxetine (PROZAC) 40 MG capsule; TAKE 1 CAPSULE (40 MG TOTAL) BY MOUTH DAILY.  Insomnia with sleep apnea -     Suvorexant (BELSOMRA) 15 MG TABS; Take 1 tablet by mouth at bedtime as needed.  Other orders -     Cancel: FLUoxetine (PROZAC) 40 MG capsule; TAKE 1 CAPSULE (40 MG TOTAL) BY MOUTH DAILY -     fluticasone (FLONASE) 50 MCG/ACT nasal spray; Place 2 sprays into both nostrils daily.   I have discontinued Theresa Mejia ALPRAZolam, montelukast, promethazine-codeine, and azithromycin. I have also changed her fluticasone and FLUoxetine. Additionally, I am having her start on Suvorexant and atorvastatin. Lastly, I am having her maintain her guaiFENesin, multivitamin, Vitamin D3,  amLODipine, valsartan, loratadine, and levothyroxine.  Meds ordered this encounter  Medications  . fluticasone (FLONASE) 50 MCG/ACT nasal spray    Sig: Place 2 sprays into both nostrils daily.    Dispense:  16 g    Refill:  3    Please dispense 90 day supply  . FLUoxetine (PROZAC)  40 MG capsule    Sig: TAKE 1 CAPSULE (40 MG TOTAL) BY MOUTH DAILY.    Dispense:  90 capsule    Refill:  1  . Suvorexant (BELSOMRA) 15 MG TABS    Sig: Take 1 tablet by mouth at bedtime as needed.    Dispense:  30 tablet    Refill:  5  . atorvastatin (LIPITOR) 20 MG tablet    Sig: Take 1 tablet (20 mg total) by mouth daily.    Dispense:  90 tablet    Refill:  3   See AVS for instructions about healthy living and anticipatory guidance.  Follow-up: Return in about 6 months (around 12/05/2015).  [name], MD

## 2015-06-05 ENCOUNTER — Encounter: Payer: Self-pay | Admitting: Internal Medicine

## 2015-06-05 LAB — HIV ANTIBODY (ROUTINE TESTING W REFLEX): HIV: NONREACTIVE

## 2015-06-05 LAB — HEPATITIS C ANTIBODY: HCV Ab: NEGATIVE

## 2015-06-05 MED ORDER — ATORVASTATIN CALCIUM 20 MG PO TABS: 20.0000 mg | ORAL_TABLET | Freq: Every day | ORAL | Status: DC

## 2015-06-08 NOTE — Assessment & Plan Note (Signed)

## 2015-06-15 DIAGNOSIS — H02401 Unspecified ptosis of right eyelid: Secondary | ICD-10-CM | POA: Diagnosis not present

## 2015-06-15 DIAGNOSIS — H02402 Unspecified ptosis of left eyelid: Secondary | ICD-10-CM | POA: Diagnosis not present

## 2015-06-15 DIAGNOSIS — H02403 Unspecified ptosis of bilateral eyelids: Secondary | ICD-10-CM | POA: Diagnosis not present

## 2015-06-23 ENCOUNTER — Ambulatory Visit (INDEPENDENT_AMBULATORY_CARE_PROVIDER_SITE_OTHER): Payer: PPO | Admitting: Internal Medicine

## 2015-06-23 ENCOUNTER — Encounter: Payer: Self-pay | Admitting: Internal Medicine

## 2015-06-23 VITALS — BP 106/70 | HR 54 | Temp 98.7°F | Resp 16 | Ht 60.0 in | Wt 152.0 lb

## 2015-06-23 DIAGNOSIS — E785 Hyperlipidemia, unspecified: Secondary | ICD-10-CM

## 2015-06-23 DIAGNOSIS — E038 Other specified hypothyroidism: Secondary | ICD-10-CM

## 2015-06-23 DIAGNOSIS — R002 Palpitations: Secondary | ICD-10-CM | POA: Diagnosis not present

## 2015-06-23 DIAGNOSIS — I10 Essential (primary) hypertension: Secondary | ICD-10-CM

## 2015-06-23 NOTE — Patient Instructions (Signed)

## 2015-06-23 NOTE — Progress Notes (Signed)
Subjective:  Patient ID: Theresa Mejia, female    DOB: 1940/07/29  Age: 75 y.o. MRN: [id number]  CC: Hypothyroidism; Hypertension; and Hyperlipidemia   HPI LAURIEANNE CAVALCANTE presents for concerns about orthostatic symptoms. For the last week she complains that she feels lightheaded and dizzy with an elevated heart rate when she goes from a sitting to a standing position. She doesn't think her heart is beating irregularly. She has not felt presyncopal and denies headache/blurred vision/edema/fatigue/dyspnea on exertion. She is not aware of anything that could've precipitated the symptoms.  Outpatient Prescriptions Prior to Visit  Medication Sig Dispense Refill  . Cholecalciferol (VITAMIN D3) 2000 UNITS TABS Take by mouth daily.    [name] FLUoxetine (PROZAC) 40 MG capsule TAKE 1 CAPSULE (40 MG TOTAL) BY MOUTH DAILY. 90 capsule 1  . fluticasone (FLONASE) 50 MCG/ACT nasal spray Place 2 sprays into both nostrils daily. 16 g 3  . guaiFENesin (MUCINEX) 600 MG 12 hr tablet Take 1,200 mg by mouth as needed. Reported on 06/04/2015    . levothyroxine (SYNTHROID, LEVOTHROID) 125 MCG tablet TAKE ONE TABLET BY MOUTH ONE TIME DAILY (Patient taking differently: TAKE HALF TABLET BY MOUTH ONE TIME DAILY) 90 tablet 1  . loratadine (CLARITIN) 10 MG tablet Take 10 mg by mouth daily as needed.    . Multiple Vitamin (MULTIVITAMIN) capsule Take 1 capsule by mouth daily.      . Suvorexant (BELSOMRA) 15 MG TABS Take 1 tablet by mouth at bedtime as needed. 30 tablet 5  . valsartan (DIOVAN) 160 MG tablet Take 1 tablet (160 mg total) by mouth daily. 90 tablet 3  . amLODipine (NORVASC) 10 MG tablet Take 1 tablet (10 mg total) by mouth daily. 90 tablet 3  . atorvastatin (LIPITOR) 20 MG tablet Take 1 tablet (20 mg total) by mouth daily. (Patient not taking: Reported on 06/23/2015) 90 tablet 3   No facility-administered medications prior to visit.    ROS Review of Systems  Constitutional: Negative.  Negative for diaphoresis,  activity change, appetite change, fatigue and unexpected weight change.  HENT: Negative.  Negative for trouble swallowing.   Eyes: Negative.   Respiratory: Negative.  Negative for cough, choking, chest tightness, shortness of breath and stridor.   Cardiovascular: Positive for palpitations.  Gastrointestinal: Negative.  Negative for nausea, vomiting, abdominal pain, diarrhea and constipation.  Endocrine: Negative.   Genitourinary: Negative.  Negative for difficulty urinating.  Musculoskeletal: Negative.  Negative for myalgias, back pain and neck pain.  Skin: Negative.  Negative for color change and rash.  Allergic/Immunologic: Negative.   Neurological: Positive for dizziness and light-headedness. Negative for tremors, seizures, syncope, facial asymmetry, speech difficulty, numbness and headaches.  Hematological: Negative.  Negative for adenopathy. Does not bruise/bleed easily.  Psychiatric/Behavioral: Negative.  Negative for sleep disturbance, dysphoric mood and agitation. The patient is not nervous/anxious.     Objective:  BP 106/70 mmHg  Pulse 54  Temp(Src) 98.7 F (37.1 C) (Oral)  Ht 5' (1.524 m)  Wt 152 lb (68.947 kg)  BMI 29.69 kg/m2  SpO2 97%  BP Readings from Last 3 Encounters:  06/23/15 106/70  06/04/15 130/78  12/10/14 120/68    Wt Readings from Last 3 Encounters:  06/23/15 152 lb (68.947 kg)  06/04/15 152 lb (68.947 kg)  12/10/14 151 lb (68.493 kg)    Physical Exam  Constitutional: She is oriented to person, place, and time. No distress.  HENT:  Mouth/Throat: Oropharynx is clear and moist. No oropharyngeal exudate.  Eyes: Conjunctivae are  normal. Right eye exhibits no discharge. Left eye exhibits no discharge. No scleral icterus.  Neck: Normal range of motion. Neck supple. No JVD present. No tracheal deviation present. No thyromegaly present.  Cardiovascular: Regular rhythm, S1 normal, S2 normal, normal heart sounds and intact distal pulses.  Bradycardia present.   Exam reveals no gallop and no friction rub.   No murmur heard. EKG ---  Marked sinus  Bradycardia  -Anteroseptal infarct -age undetermined.   ABNORMAL    Pulmonary/Chest: Effort normal and breath sounds normal. No stridor. No respiratory distress. She has no wheezes. She has no rales. She exhibits no tenderness.  Abdominal: Soft. Bowel sounds are normal. She exhibits no distension and no mass. There is no tenderness. There is no rebound and no guarding.  Musculoskeletal: Normal range of motion. She exhibits no edema.  Lymphadenopathy:    She has no cervical adenopathy.  Neurological: She is oriented to person, place, and time.  Skin: Skin is warm and dry. No rash noted. She is not diaphoretic. No erythema. No pallor.  Vitals reviewed.   Lab Results  Component Value Date   WBC 6.0 06/04/2015   HGB 12.3 06/04/2015   HCT 37.5 06/04/2015   PLT 318.0 06/04/2015   GLUCOSE 86 06/04/2015   CHOL 218* 06/04/2015   TRIG 111.0 06/04/2015   HDL 42.10 06/04/2015   LDLDIRECT 148.3 09/01/2010   LDLCALC 153* 06/04/2015   ALT 13 06/04/2015   AST 20 06/04/2015   NA 137 06/04/2015   K 3.5 06/04/2015   CL 103 06/04/2015   CREATININE 0.85 06/04/2015   BUN 24* 06/04/2015   CO2 28 06/04/2015   TSH 1.31 06/04/2015   HGBA1C 5.5 10/15/2012    Mm Screening Breast Tomo Bilateral  10/31/2014  CLINICAL DATA:  Screening. EXAM: DIGITAL SCREENING BILATERAL MAMMOGRAM WITH 3D TOMO WITH CAD COMPARISON:  Previous exam(s). ACR Breast Density Category c: The breast tissue is heterogeneously dense, which may obscure small masses. FINDINGS: There are no findings suspicious for malignancy. Images were processed with CAD. IMPRESSION: No mammographic evidence of malignancy. A result letter of this screening mammogram will be mailed directly to the patient. RECOMMENDATION: Screening mammogram in one year. (Code:SM-B-01Y) BI-RADS CATEGORY  1: Negative. Electronically Signed   By: Lovey Newcomer M.D.   On: 10/31/2014  09:49    Assessment & Plan:   Jannelle was seen today for hypothyroidism, hypertension and hyperlipidemia.  Diagnoses and all orders for this visit:  Hyperlipidemia with target LDL less than 130  Essential hypertension- her blood pressure is over controlled and I think this is contributing to her symptoms, I've asked her to stop taking the calcium channel blocker but for now will continue the ARB  Other specified hypothyroidism- her TSH was recently in the normal range so I do not think this is contributing to her symptoms  Palpitations- she complains of tachycardia but her EKG today shows sinus bradycardia, will discontinue the calcium channel blocker and will schedule her for 48 hour Holter monitor to see if she has a significant dysrhythmia such as SVT, atrial fibrillation, tachycardia/bradycardia syndrome, etc. -     EKG 12-Lead -     Holter monitor - 48 hour; Future   I have discontinued Ms. Elzy amLODipine. I am also having her maintain her guaiFENesin, multivitamin, Vitamin D3, valsartan, loratadine, levothyroxine, fluticasone, FLUoxetine, Suvorexant, and atorvastatin.  No orders of the defined types were placed in this encounter.     Follow-up: Return in about 3 weeks (around 07/14/2015).  [name], MD

## 2015-06-23 NOTE — Progress Notes (Signed)
Pre visit review using our clinic review tool, if applicable. No additional management support is needed unless otherwise documented below in the visit note. 

## 2015-07-10 ENCOUNTER — Encounter: Payer: Self-pay | Admitting: Family

## 2015-07-10 ENCOUNTER — Ambulatory Visit (INDEPENDENT_AMBULATORY_CARE_PROVIDER_SITE_OTHER): Payer: PPO | Admitting: Family

## 2015-07-10 VITALS — BP 162/86 | HR 54 | Temp 98.4°F | Resp 16 | Ht 60.0 in | Wt 155.0 lb

## 2015-07-10 DIAGNOSIS — J019 Acute sinusitis, unspecified: Secondary | ICD-10-CM | POA: Insufficient documentation

## 2015-07-10 DIAGNOSIS — J0140 Acute pansinusitis, unspecified: Secondary | ICD-10-CM | POA: Diagnosis not present

## 2015-07-10 MED ORDER — METHYLPREDNISOLONE ACETATE 40 MG/ML IJ SUSP
40.0000 mg | Freq: Once | INTRAMUSCULAR | Status: AC
  Administered 2015-07-10: 40 mg via INTRAMUSCULAR

## 2015-07-10 NOTE — Patient Instructions (Signed)
Thank you for choosing [facility].  Summary/Instructions:  If your symptoms worsen or fail to improve, please contact our office for further instruction, or in case of emergency go directly to the emergency room at the closest medical facility.    General Recommendations:    Please drink plenty of fluids.  Get plenty of rest   Sleep in humidified air  Use saline nasal sprays  Netti pot   OTC Medications:  Decongestants - helps relieve congestion   Flonase (generic fluticasone) or Nasacort (generic triamcinolone) - please make sure to use the "cross-over" technique at a 45 degree angle towards the opposite eye as opposed to straight up the nasal passageway.   Sudafed (generic pseudoephedrine - Note this is the one that is available behind the pharmacy counter); Products with phenylephrine (-PE) may also be used but is often not as effective as pseudoephedrine.   If you have HIGH BLOOD PRESSURE - Coricidin HBP; AVOID any product that is -D as this contains pseudoephedrine which may increase your blood pressure.  Afrin (oxymetazoline) every 6-8 hours for up to 3 days.   Allergies - helps relieve runny nose, itchy eyes and sneezing   Claritin (generic loratidine), Allegra (fexofenidine), or Zyrtec (generic cyrterizine) for runny nose. These medications should not cause drowsiness.  Note - Benadryl (generic diphenhydramine) may be used however may cause drowsiness  Cough -   Delsym or Robitussin (generic dextromethorphan)  Expectorants - helps loosen mucus to ease removal   Mucinex (generic guaifenesin) as directed on the package.  Headaches / General Aches   Tylenol (generic acetaminophen) - DO NOT EXCEED 3 grams (3,000 mg) in a 24 hour time period  Advil/Motrin (generic ibuprofen)   Sore Throat -   Salt water gargle   Chloraseptic (generic benzocaine) spray or lozenges / Sucrets (generic dyclonine)    Sinusitis Sinusitis is redness, soreness, and  inflammation of the paranasal sinuses. Paranasal sinuses are air pockets within the bones of your face (beneath the eyes, the middle of the forehead, or above the eyes). In healthy paranasal sinuses, mucus is able to drain out, and air is able to circulate through them by way of your nose. However, when your paranasal sinuses are inflamed, mucus and air can become trapped. This can allow bacteria and other germs to grow and cause infection. Sinusitis can develop quickly and last only a short time (acute) or continue over a long period (chronic). Sinusitis that lasts for more than 12 weeks is considered chronic.  CAUSES  Causes of sinusitis include:  Allergies.  Structural abnormalities, such as displacement of the cartilage that separates your nostrils (deviated septum), which can decrease the air flow through your nose and sinuses and affect sinus drainage.  Functional abnormalities, such as when the small hairs (cilia) that line your sinuses and help remove mucus do not work properly or are not present. SIGNS AND SYMPTOMS  Symptoms of acute and chronic sinusitis are the same. The primary symptoms are pain and pressure around the affected sinuses. Other symptoms include:  Upper toothache.  Earache.  Headache.  Bad breath.  Decreased sense of smell and taste.  A cough, which worsens when you are lying flat.  Fatigue.  Fever.  Thick drainage from your nose, which often is green and may contain pus (purulent).  Swelling and warmth over the affected sinuses. DIAGNOSIS  Your health care provider will perform a physical exam. During the exam, your health care provider may:  Look in your nose for signs  of abnormal growths in your nostrils (nasal polyps).  Tap over the affected sinus to check for signs of infection.  View the inside of your sinuses (endoscopy) using an imaging device that has a light attached (endoscope). If your health care provider suspects that you have chronic  sinusitis, one or more of the following tests may be recommended:  Allergy tests.  Nasal culture. A sample of mucus is taken from your nose, sent to a lab, and screened for bacteria.  Nasal cytology. A sample of mucus is taken from your nose and examined by your health care provider to determine if your sinusitis is related to an allergy. TREATMENT  Most cases of acute sinusitis are related to a viral infection and will resolve on their own within 10 days. Sometimes medicines are prescribed to help relieve symptoms (pain medicine, decongestants, nasal steroid sprays, or saline sprays).  However, for sinusitis related to a bacterial infection, your health care provider will prescribe antibiotic medicines. These are medicines that will help kill the bacteria causing the infection.  Rarely, sinusitis is caused by a fungal infection. In theses cases, your health care provider will prescribe antifungal medicine. For some cases of chronic sinusitis, surgery is needed. Generally, these are cases in which sinusitis recurs more than 3 times per year, despite other treatments. HOME CARE INSTRUCTIONS   Drink plenty of water. Water helps thin the mucus so your sinuses can drain more easily.  Use a humidifier.  Inhale steam 3 to 4 times a day (for example, sit in the bathroom with the shower running).  Apply a warm, moist washcloth to your face 3 to 4 times a day, or as directed by your health care provider.  Use saline nasal sprays to help moisten and clean your sinuses.  Take medicines only as directed by your health care provider.  If you were prescribed either an antibiotic or antifungal medicine, finish it all even if you start to feel better. SEEK IMMEDIATE MEDICAL CARE IF:  You have increasing pain or severe headaches.  You have nausea, vomiting, or drowsiness.  You have swelling around your face.  You have vision problems.  You have a stiff neck.  You have difficulty  breathing. MAKE SURE YOU:   Understand these instructions.  Will watch your condition.  Will get help right away if you are not doing well or get worse. Document Released: 12/27/2004 Document Revised: 05/13/2013 Document Reviewed: 01/11/2011 [facility] Patient Information 2015 [place], [place]. This information is not intended to replace advice given to you by your health care provider. Make sure you discuss any questions you have with your health care provider.

## 2015-07-10 NOTE — Progress Notes (Addendum)
Subjective:    Patient ID: Theresa Mejia, female    DOB: 30-Jun-1940, 75 y.o.   MRN: [id number]  Chief Complaint  Patient presents with  . Cough    x2 days, has had a bad cough, felt like her throat was swollen last night, pressure in her ears    HPI:  Theresa Mejia is a 75 y.o. female who  has a past medical history of Hypersomnia; Allergic rhinitis; Hypertension; Hypercholesteremia; Hypothyroidism; DJD (degenerative joint disease); Spondylosis, cervical; Headache(784.0); Reactive depression (situational); Lumbar disc disease; OBSTRUCTIVE SLEEP APNEA; OVERACTIVE BLADDER; Depression; and Internal hemorrhoid (11/2008). and presents today For an acute office visit.  This is a new problem. Associated symptoms of cough,sinus pressure, ear pressure and swollen throat have been going on for approximately 2 days. Denies fevers. Feels that her throat is swelling with no symptoms of anphaylaxis. Modifying factors include Benedryl, Claratin, and Mucinex which have helped a little. Severity of the cough is enough to sleep at night. Denies any recent antibiotic use.    Allergies  Allergen Reactions  . Ramipril     REACTION: pt states INTOLERANT to Altace  . Sulfonamide Derivatives     REACTION: hypotension  . Sulfur Other (See Comments)    Says it dropped her blood pressure really low.      Current Outpatient Prescriptions on File Prior to Visit  Medication Sig Dispense Refill  . Cholecalciferol (VITAMIN D3) 2000 UNITS TABS Take by mouth daily.    [name] FLUoxetine (PROZAC) 40 MG capsule TAKE 1 CAPSULE (40 MG TOTAL) BY MOUTH DAILY. 90 capsule 1  . fluticasone (FLONASE) 50 MCG/ACT nasal spray Place 2 sprays into both nostrils daily. 16 g 3  . guaiFENesin (MUCINEX) 600 MG 12 hr tablet Take 1,200 mg by mouth as needed. Reported on 06/04/2015    . levothyroxine (SYNTHROID, LEVOTHROID) 125 MCG tablet TAKE ONE TABLET BY MOUTH ONE TIME DAILY (Patient taking differently: TAKE HALF TABLET BY MOUTH ONE TIME  DAILY) 90 tablet 1  . loratadine (CLARITIN) 10 MG tablet Take 10 mg by mouth daily as needed.    . Multiple Vitamin (MULTIVITAMIN) capsule Take 1 capsule by mouth daily.      . valsartan (DIOVAN) 160 MG tablet Take 1 tablet (160 mg total) by mouth daily. 90 tablet 3   No current facility-administered medications on file prior to visit.    Review of Systems  Constitutional: Negative for fever and chills.  HENT: Positive for congestion, ear pain, sinus pressure and sore throat.   Respiratory: Positive for cough. Negative for chest tightness and shortness of breath.   Neurological: Positive for headaches.      Objective:    BP 162/86 mmHg  Pulse 54  Temp(Src) 98.4 F (36.9 C) (Oral)  Resp 16  Ht 5' (1.524 m)  Wt 155 lb (70.308 kg)  BMI 30.27 kg/m2  SpO2 94% Nursing note and vital signs reviewed.  Physical Exam  Constitutional: She is oriented to person, place, and time. She appears well-developed and well-nourished. No distress.  HENT:  Right Ear: Hearing, tympanic membrane, external ear and ear canal normal.  Left Ear: Hearing, tympanic membrane, external ear and ear canal normal.  Nose: Right sinus exhibits maxillary sinus tenderness and frontal sinus tenderness. Left sinus exhibits maxillary sinus tenderness and frontal sinus tenderness.  Mouth/Throat: Uvula is midline, oropharynx is clear and moist and mucous membranes are normal.  Neck: Neck supple.  Cardiovascular: Normal rate, regular rhythm, normal heart sounds and intact distal  pulses.   Pulmonary/Chest: Effort normal and breath sounds normal.  Neurological: She is alert and oriented to person, place, and time.  Skin: Skin is warm and dry.  Psychiatric: She has a normal mood and affect. Her behavior is normal. Judgment and thought content normal.       Assessment & Plan:   Problem List Items Addressed This Visit      Respiratory   Sinusitis, acute - Primary    Symptoms and exam consistent with sinusitis most  likely viral and related to allergies. Treat conservatively with over-the-counter medications as needed for symptom relief and supportive care. In office injection of Depo-Medrol provided. Follow-up if symptoms worsen or do not improve.      Relevant Medications   methylPREDNISolone acetate (DEPO-MEDROL) injection 40 mg (Completed)   methylPREDNISolone acetate (DEPO-MEDROL) injection 40 mg (Completed)       I have discontinued Ms. Jezewski Suvorexant and atorvastatin. I am also having her maintain her guaiFENesin, multivitamin, Vitamin D3, valsartan, loratadine, levothyroxine, fluticasone, and FLUoxetine. We administered methylPREDNISolone acetate and methylPREDNISolone acetate.   Meds ordered this encounter  Medications  . methylPREDNISolone acetate (DEPO-MEDROL) injection 40 mg    Sig:   . methylPREDNISolone acetate (DEPO-MEDROL) injection 40 mg    Sig:      Follow-up: Return if symptoms worsen or fail to improve.  Mauricio Po, FNP

## 2015-07-10 NOTE — Assessment & Plan Note (Signed)
Symptoms and exam consistent with sinusitis most likely viral and related to allergies. Treat conservatively with over-the-counter medications as needed for symptom relief and supportive care. In office injection of Depo-Medrol provided. Follow-up if symptoms worsen or do not improve.

## 2015-07-15 DIAGNOSIS — H04123 Dry eye syndrome of bilateral lacrimal glands: Secondary | ICD-10-CM | POA: Diagnosis not present

## 2015-07-16 ENCOUNTER — Ambulatory Visit (INDEPENDENT_AMBULATORY_CARE_PROVIDER_SITE_OTHER): Payer: PPO | Admitting: Internal Medicine

## 2015-07-16 ENCOUNTER — Encounter: Payer: Self-pay | Admitting: Internal Medicine

## 2015-07-16 VITALS — BP 160/84 | HR 50 | Temp 98.0°F | Resp 16 | Wt 153.4 lb

## 2015-07-16 DIAGNOSIS — E038 Other specified hypothyroidism: Secondary | ICD-10-CM | POA: Diagnosis not present

## 2015-07-16 DIAGNOSIS — R002 Palpitations: Secondary | ICD-10-CM | POA: Diagnosis not present

## 2015-07-16 DIAGNOSIS — R001 Bradycardia, unspecified: Secondary | ICD-10-CM | POA: Diagnosis not present

## 2015-07-16 DIAGNOSIS — I10 Essential (primary) hypertension: Secondary | ICD-10-CM | POA: Diagnosis not present

## 2015-07-16 NOTE — Patient Instructions (Signed)

## 2015-07-16 NOTE — Progress Notes (Signed)
Subjective:  Patient ID: Theresa Mejia, female    DOB: 16-Mar-1940  Age: 75 y.o. MRN: [id number]  CC: Hypertension and Palpitations   HPI Theresa Mejia presents for follow-up on hypertension and palpitations. When I last saw her a few weeks ago I ordered a Holter monitor to evaluate her palpitations again. She elected not to have that Holter monitor performed. She complains that she feels like her symptoms have worsened. She is complaining of hard, fast palpitations intermittently throughout the day. [name] had a few episodes of shortness of breath and dyspnea on exertion. The palpitations do not occur with activity. She has palpitations every day now. She denies dizziness, lightheadedness, presyncope, syncope, edema, or fatigue.  She has decided not to treat her hypertension. She has a cuff at home that she has been using but it doesn't appear to be giving her accurate blood pressure readings. She denies headache, blurred vision, or chest pain.  Outpatient Prescriptions Prior to Visit  Medication Sig Dispense Refill  . Cholecalciferol (VITAMIN D3) 2000 UNITS TABS Take by mouth daily.    [name] FLUoxetine (PROZAC) 40 MG capsule TAKE 1 CAPSULE (40 MG TOTAL) BY MOUTH DAILY. 90 capsule 1  . fluticasone (FLONASE) 50 MCG/ACT nasal spray Place 2 sprays into both nostrils daily. 16 g 3  . guaiFENesin (MUCINEX) 600 MG 12 hr tablet Take 1,200 mg by mouth as needed. Reported on 06/04/2015    . levothyroxine (SYNTHROID, LEVOTHROID) 125 MCG tablet TAKE ONE TABLET BY MOUTH ONE TIME DAILY (Patient taking differently: TAKE HALF TABLET BY MOUTH ONE TIME DAILY) 90 tablet 1  . loratadine (CLARITIN) 10 MG tablet Take 10 mg by mouth daily as needed.    . Multiple Vitamin (MULTIVITAMIN) capsule Take 1 capsule by mouth daily.      . valsartan (DIOVAN) 160 MG tablet Take 1 tablet (160 mg total) by mouth daily. 90 tablet 3   No facility-administered medications prior to visit.    ROS Review of Systems  Constitutional:  Negative.  Negative for chills, diaphoresis, activity change, appetite change and fatigue.  HENT: Negative.   Eyes: Negative.  Negative for visual disturbance.  Respiratory: Positive for shortness of breath. Negative for cough, choking, chest tightness, wheezing and stridor.   Cardiovascular: Positive for palpitations. Negative for chest pain and leg swelling.  Gastrointestinal: Negative.  Negative for nausea, vomiting, abdominal pain and diarrhea.  Endocrine: Negative.   Genitourinary: Negative.   Musculoskeletal: Negative.  Negative for back pain and neck pain.  Skin: Negative.   Allergic/Immunologic: Negative.   Neurological: Negative.  Negative for dizziness, syncope, speech difficulty, weakness, light-headedness, numbness and headaches.  Hematological: Negative.  Negative for adenopathy. Does not bruise/bleed easily.    Objective:  BP 160/84 mmHg  Pulse 50  Temp(Src) 98 F (36.7 C) (Oral)  Resp 16  Wt 153 lb 6.4 oz (69.582 kg)  SpO2 93%  BP Readings from Last 3 Encounters:  07/16/15 160/84  07/10/15 162/86  06/23/15 106/70    Wt Readings from Last 3 Encounters:  07/16/15 153 lb 6.4 oz (69.582 kg)  07/10/15 155 lb (70.308 kg)  06/23/15 152 lb (68.947 kg)    Physical Exam  Constitutional: She is oriented to person, place, and time. No distress.  HENT:  Mouth/Throat: Oropharynx is clear and moist. No oropharyngeal exudate.  Eyes: Conjunctivae are normal. Right eye exhibits no discharge. No scleral icterus.  Neck: Normal range of motion. Neck supple. No JVD present. No tracheal deviation present. No thyromegaly present.  Cardiovascular: Regular rhythm, S1 normal, S2 normal, normal heart sounds and intact distal pulses.  Bradycardia present.  Exam reveals no gallop, no S3, no S4 and no friction rub.   No murmur heard. EKG ---- Sinus  Bradycardia  Low voltage in precordial leads.   -Left atrial enlargement.   -Anteroseptal infarct -age undetermined.   ABNORMAL      Pulmonary/Chest: Effort normal and breath sounds normal. No stridor. No respiratory distress. She has no wheezes. She has no rales. She exhibits no tenderness.  Abdominal: Soft. Bowel sounds are normal. She exhibits no distension and no mass. There is no tenderness. There is no rebound and no guarding.  Musculoskeletal: Normal range of motion. She exhibits no edema or tenderness.  Lymphadenopathy:    She has no cervical adenopathy.  Neurological: She is oriented to person, place, and time.  Skin: Skin is warm and dry. No rash noted. She is not diaphoretic. No erythema. No pallor.  Vitals reviewed.   Lab Results  Component Value Date   WBC 6.0 06/04/2015   HGB 12.3 06/04/2015   HCT 37.5 06/04/2015   PLT 318.0 06/04/2015   GLUCOSE 86 06/04/2015   CHOL 218* 06/04/2015   TRIG 111.0 06/04/2015   HDL 42.10 06/04/2015   LDLDIRECT 148.3 09/01/2010   LDLCALC 153* 06/04/2015   ALT 13 06/04/2015   AST 20 06/04/2015   NA 137 06/04/2015   K 3.5 06/04/2015   CL 103 06/04/2015   CREATININE 0.85 06/04/2015   BUN 24* 06/04/2015   CO2 28 06/04/2015   TSH 1.31 06/04/2015   HGBA1C 5.5 10/15/2012    Mm Screening Breast Tomo Bilateral  10/31/2014  CLINICAL DATA:  Screening. EXAM: DIGITAL SCREENING BILATERAL MAMMOGRAM WITH 3D TOMO WITH CAD COMPARISON:  Previous exam(s). ACR Breast Density Category c: The breast tissue is heterogeneously dense, which may obscure small masses. FINDINGS: There are no findings suspicious for malignancy. Images were processed with CAD. IMPRESSION: No mammographic evidence of malignancy. A result letter of this screening mammogram will be mailed directly to the patient. RECOMMENDATION: Screening mammogram in one year. (Code:[id number]-B-01Y) BI-RADS CATEGORY  1: Negative. Electronically Signed   By: Lovey Newcomer M.D.   On: 10/31/2014 09:49    Assessment & Plan:   Theresa Mejia was seen today for hypertension and palpitations.  Diagnoses and all orders for this visit:  Palpitations-  her EKG today reveals sinus bradycardia but no dysrhythmia, other elements of the EKG are unchanged compared to her prior EKG. I have ordered another Holter monitor to see if she has developed a dysrhythmia or tachybradycardia syndrome. -     EKG 12-Lead -     Holter monitor - 48 hour; Future  Bradycardia -     Holter monitor - 48 hour; Future  Essential hypertension- her blood pressure is not adequately well controlled but she is also not willing to start an antihypertensive. When she does start a blood pressure medicine will need to avoid calcium channel blockers and beta blockers due to bradycardia.  Other specified hypothyroidism- her recent TSH was in the normal range so I don't think this is contributing to her symptoms.   I am having Ms. Slape maintain her guaiFENesin, multivitamin, Vitamin D3, valsartan, loratadine, levothyroxine, fluticasone, and FLUoxetine.  No orders of the defined types were placed in this encounter.     Follow-up: Return in about 3 weeks (around 08/06/2015).  [name], MD

## 2015-07-21 ENCOUNTER — Telehealth: Payer: Self-pay | Admitting: *Deleted

## 2015-07-21 DIAGNOSIS — R001 Bradycardia, unspecified: Secondary | ICD-10-CM

## 2015-07-21 DIAGNOSIS — R002 Palpitations: Secondary | ICD-10-CM

## 2015-07-21 NOTE — Telephone Encounter (Signed)
Per mary needing another order for holter monitor because MD did not put location on the first one. Went and addendum the ov, then they called nad stated it has to be put in again could not take addendum...Theresa Mejia

## 2015-07-21 NOTE — Addendum Note (Signed)
Addended by: Earnstine Regal on: 07/21/2015 04:28 PM   Modules accepted: Orders

## 2015-07-24 ENCOUNTER — Ambulatory Visit (INDEPENDENT_AMBULATORY_CARE_PROVIDER_SITE_OTHER): Payer: PPO

## 2015-07-24 DIAGNOSIS — R001 Bradycardia, unspecified: Secondary | ICD-10-CM | POA: Diagnosis not present

## 2015-07-24 DIAGNOSIS — R002 Palpitations: Secondary | ICD-10-CM

## 2015-07-30 ENCOUNTER — Encounter: Payer: Self-pay | Admitting: Internal Medicine

## 2015-07-30 ENCOUNTER — Other Ambulatory Visit: Payer: Self-pay | Admitting: Internal Medicine

## 2015-07-30 DIAGNOSIS — R001 Bradycardia, unspecified: Secondary | ICD-10-CM

## 2015-07-30 DIAGNOSIS — R002 Palpitations: Secondary | ICD-10-CM

## 2015-07-31 ENCOUNTER — Telehealth: Payer: Self-pay | Admitting: Emergency Medicine

## 2015-07-31 NOTE — Telephone Encounter (Signed)
She needs to be sooner than Sept

## 2015-07-31 NOTE — Telephone Encounter (Signed)
Pt called and has questions about being referred to a cardiologist. Wants a call back. Please follow up thanks.

## 2015-07-31 NOTE — Telephone Encounter (Signed)
I have her scheduled for 8/3.

## 2015-07-31 NOTE — Telephone Encounter (Signed)
Pt wanted the email with the holter monitor results explain. This has been explained to the patient in common language.   Pt also had a question about the referral for cardiology and when the appt would be scheduled. Informed patient I would send a message to referral since it was put in as urgent.   I have added a note to the referral:   "Spoke to patient and she is requesting Dr. Johnsie Cancel if at all possible. Pt husband was a pt of Dr. Johnsie Cancel and she feels comfortable with him. Pt has called the office and has an appt September 13th but may need a sooner appt due to the abnormal holter monitor result."

## 2015-08-04 ENCOUNTER — Other Ambulatory Visit: Payer: Self-pay | Admitting: Internal Medicine

## 2015-08-13 ENCOUNTER — Encounter: Payer: Self-pay | Admitting: Cardiology

## 2015-08-13 ENCOUNTER — Ambulatory Visit (INDEPENDENT_AMBULATORY_CARE_PROVIDER_SITE_OTHER): Payer: PPO | Admitting: Cardiology

## 2015-08-13 ENCOUNTER — Ambulatory Visit: Payer: PPO | Admitting: Cardiology

## 2015-08-13 VITALS — BP 118/68 | HR 52 | Ht 60.0 in | Wt 148.0 lb

## 2015-08-13 DIAGNOSIS — R002 Palpitations: Secondary | ICD-10-CM

## 2015-08-13 DIAGNOSIS — R001 Bradycardia, unspecified: Secondary | ICD-10-CM | POA: Diagnosis not present

## 2015-08-13 DIAGNOSIS — R0602 Shortness of breath: Secondary | ICD-10-CM | POA: Diagnosis not present

## 2015-08-13 DIAGNOSIS — R55 Syncope and collapse: Secondary | ICD-10-CM

## 2015-08-13 NOTE — Patient Instructions (Signed)
Medication Instructions:  The current medical regimen is effective;  continue present plan and medications.  Testing/Procedures: Your physician has requested that you have an echocardiogram. Echocardiography is a painless test that uses sound waves to create images of your heart. It provides your doctor with information about the size and shape of your heart and how well your heart's chambers and valves are working. This procedure takes approximately one hour. There are no restrictions for this procedure.  Your physician has requested that you have an exercise tolerance test. For further information please visit [email or web address]. Please also follow instruction sheet, as given.  Follow-Up: Follow up as needed after testing.  If you need a refill on your cardiac medications before your next appointment, please call your pharmacy.  Thank you for choosing [facility]!!

## 2015-08-13 NOTE — Progress Notes (Signed)
Cardiology Office Note    Date:  08/13/2015   ID:  Theresa, Mejia [birth year], MRN [id number]  PCP:  Theresa Calico, MD  Cardiologist:   Theresa Furbish, MD   No chief complaint on file.   History of Present Illness:  Theresa Mejia is a 75 y.o. female here for the evaluation of abnormal Holter monitor result. Her husband is a patient of Dr. Johnsie Cancel.  24 hour Holter monitor from 07/24/15:  Sinus rhythm and sinus bradycardia  Heart rate range 40-70 bpm with average of 48 bpm  Occasional junctional escape rhythm  Rare PACs and PVCs.   Abnormal Holter monitor with sinus bradycardia predominating an average heart rate of 48 bpm. Occasional accelerated junctional rhythm at rates between 60 and 70 bpm. No symptoms reported.  Overall findings could be compatible with sinus node dysfunction.  She was seen by Dr. Ronnald Mejia her primary internist on 07/16/15 for sensation of palpitations. She feels hard, fast palpitations intermittently through the day. An EKG demonstrated sinus bradycardia left atrial enlargement, low voltage.  TSH was normal. Avoiding calcium channel blockers and beta blockers due to bradycardia.  Back in February 2017 she wore a 48-hour Holter monitor as well as which showed fewer PACs, rare PVC and minimal heart rate of 45 bpm with average similar to current Holter monitor.  No pulses were detected or ventricular arrhythmias.  Her symptoms also include occasional sweats, night sweats which been present for many years she states, no specific triggers which can lead to sensation of dizziness pallor, mild nausea. She had an episode when she was talking with me she states and I checked her blood pressure at the same time and it was in the mid [id number] systolic. Reassuring. Her thyroid function has been normal and recently checked. She takes Synthroid. She is not having any chest discomfort.  She states that she used to go to a cardio workout class but she had to discontinue this  because she could not keep up. She thinks that this is new for her. She is now on the recumbent bicycle at her own pace. She is wondering why her exercise tolerance has decreased.  She is a nonsmoker.  Past Medical History:  Diagnosis Date  . Allergic rhinitis   . Depression   . DJD (degenerative joint disease)   . Headache(784.0)   . Hypercholesteremia   . Hypersomnia   . Hypertension   . Hypothyroidism   . Internal hemorrhoid 11/2008   s/p banding ([facility])  . Lumbar disc disease   . OBSTRUCTIVE SLEEP APNEA    NPSG 04/29/07- AHI 1.1/hr, RDI 21.2/hr. Weight 176 lbs. CPAP was tried based on the RDI.    [name] OVERACTIVE BLADDER   . Reactive depression (situational)   . Spondylosis, cervical     Past Surgical History:  Procedure Laterality Date  . KNEE ARTHROSCOPY     Dr. Maureen Mejia  . rotator cuff surgery     Dr. Daylene Mejia  . TUBAL LIGATION      Current Medications: Outpatient Medications Prior to Visit  Medication Sig Dispense Refill  . Cholecalciferol (VITAMIN D3) 2000 UNITS TABS Take by mouth daily.    [name] FLUoxetine (PROZAC) 40 MG capsule TAKE 1 CAPSULE (40 MG TOTAL) BY MOUTH DAILY. 90 capsule 1  . fluticasone (FLONASE) 50 MCG/ACT nasal spray Place 2 sprays into both nostrils daily. 16 g 3  . guaiFENesin (MUCINEX) 600 MG 12 hr tablet Take 1,200 mg by mouth as needed. Reported on 06/04/2015    .  Multiple Vitamin (MULTIVITAMIN) capsule Take 1 capsule by mouth daily.      . valsartan (DIOVAN) 160 MG tablet TAKE 1 TABLET BY MOUTH DAILY. 90 tablet 3  . levothyroxine (SYNTHROID, LEVOTHROID) 125 MCG tablet TAKE ONE TABLET BY MOUTH ONE TIME DAILY (Patient taking differently: TAKE HALF TABLET BY MOUTH ONE TIME DAILY) 90 tablet 1  . loratadine (CLARITIN) 10 MG tablet Take 10 mg by mouth daily as needed.     No facility-administered medications prior to visit.      Allergies:   Ramipril; Sulfonamide derivatives; and Sulfur   Social History   Social History  . Marital status: Married     Spouse name: Theresa Mejia  . Number of children: 2  . Years of education: Theresa Mejia   Occupational History  . 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 Smoking status: Former Smoker    Quit date: 01/11/1968  . Smokeless tobacco: None  . Alcohol use No  . Drug use: No  . Sexual activity: Not Asked   Other Topics Concern  . None   Social History Narrative   married, lives with spouse -   Retired 05/2013     Family History:  The patient's family history includes Cancer (age of onset: 30) in her mother; Crohn's disease (age of onset: 66) in her father; Hypothyroidism in her mother; Macular degeneration in her mother; Parkinsonism in her maternal grandmother; Stroke (age of onset: 58) in her mother; Tremor in her mother.   ROS:   Please see the history of present illness.    ROS All other systems reviewed and are negative.   PHYSICAL EXAM:   VS:  BP 118/68   Pulse (!) 52   Ht 5' (1.524 m)   Wt 148 lb (67.1 kg)   BMI 28.90 kg/m    GEN: Well nourished, well developed, in no acute distress  HEENT: normal  Neck: no JVD, carotid bruits, or masses Cardiac: brady reg; no murmurs, rubs, or gallops,no edema  Respiratory:  clear to auscultation bilaterally, normal work of breathing GI: soft, nontender, nondistended, + BS MS: no deformity or atrophy  Skin: warm and dry, no rash Neuro:  Alert and Oriented x 3, Strength and sensation are intact Psych: euthymic mood, full affect  Wt Readings from Last 3 Encounters:  08/13/15 148 lb (67.1 kg)  07/16/15 153 lb 6.4 oz (69.6 kg)  07/10/15 155 lb (70.3 kg)      Studies/Labs Reviewed:   EKG:  Recent Labs: 06/04/2015: ALT 13; BUN 24; Creatinine, Ser 0.85; Hemoglobin 12.3; Platelets 318.0; Potassium 3.5; Sodium 137; TSH 1.31   Lipid Panel    Component Value Date/Time   CHOL 218 (H) 06/04/2015 1443   TRIG 111.0 06/04/2015 1443   HDL 42.10 06/04/2015 1443   CHOLHDL 5 06/04/2015 1443   VLDL 22.2 06/04/2015  1443   LDLCALC 153 (H) 06/04/2015 1443   LDLDIRECT 148.3 09/01/2010 1041    Additional studies/ records that were reviewed today include:  Stress echocardiogram, EKG, Holter monitors personally reviewed with her, prior office notes    ASSESSMENT:    1. SOB (shortness of breath)   2. Palpitations   3. Bradycardia   4. Vagal reaction      PLAN:  In order of problems listed above:  Bradycardia/abnormal Holter monitor  - I will check an exercise treadmill test to ensure that she has chronotropic competence. In 2014 she underwent an exercise treadmill and  she was able to increase her heart rate to the 130 range. Erring these monitoring. However her heart rate only achieved 70 bpm from a mean of 48 bpm. This may be a reason why she is feeling more short of breath during physical activity. I want to see if she is able to increase her heart rate effectively. TSH is reassuring. She is not on any beta blockers or calcium channel blocker second slower heart rate. Some of her shortness of breath could be from deconditioning. I will check an echocardiogram to ensure proper structure and function of her heart. She remembers during previous treadmill that it took a while for her heart rate to increase. If she does demonstrate chronotropic incompetence, we consider electro physiology evaluation, Dr. Rayann Heman who her husband sees, for potential pacemaker placement to improve this. For right now, I do not think EP referral is warranted.  Palpitations  - I believe that the symptoms are most likely exacerbated by PVCs and PACs. Described the physiology to her.  Vagal-like response  - Asked her to make sure that she continues with hydration.  - With her decreased overall heart rate lifelong, she is likely hyper vagal resulting in increased braking mechanism to heart rate. This may also be responsible for her transient sensation of pallor, mild nausea, mild dizziness and diaphoresis. Her blood pressure  medication has been reduced. At some point, this may need to be reduced more. Previous dobutamine stress echo was normal in 2014.    Medication Adjustments/Labs and Tests Ordered: Current medicines are reviewed at length with the patient today.  Concerns regarding medicines are outlined above.  Medication changes, Labs and Tests ordered today are listed in the Patient Instructions below. Patient Instructions  Medication Instructions:  The current medical regimen is effective;  continue present plan and medications.  Testing/Procedures: Your physician has requested that you have an echocardiogram. Echocardiography is a painless test that uses sound waves to create images of your heart. It provides your doctor with information about the size and shape of your heart and how well your heart's chambers and valves are working. This procedure takes approximately one hour. There are no restrictions for this procedure.  Your physician has requested that you have an exercise tolerance test. For further information please visit [email or web address]. Please also follow instruction sheet, as given.  Follow-Up: Follow up as needed after testing.  If you need a refill on your cardiac medications before your next appointment, please call your pharmacy.  Thank you for choosing [facility]!!        Signed, Theresa Furbish, MD  08/13/2015 12:07 PM    [address], [place], [place]  [address] Phone: [phone]; Fax: [phone]

## 2015-08-25 ENCOUNTER — Encounter: Payer: Self-pay | Admitting: Cardiology

## 2015-08-26 ENCOUNTER — Institutional Professional Consult (permissible substitution): Payer: PPO | Admitting: Pulmonary Disease

## 2015-08-27 ENCOUNTER — Other Ambulatory Visit: Payer: Self-pay

## 2015-08-27 ENCOUNTER — Ambulatory Visit (INDEPENDENT_AMBULATORY_CARE_PROVIDER_SITE_OTHER): Payer: PPO

## 2015-08-27 ENCOUNTER — Ambulatory Visit (HOSPITAL_COMMUNITY): Payer: PPO | Attending: Cardiology

## 2015-08-27 DIAGNOSIS — I429 Cardiomyopathy, unspecified: Secondary | ICD-10-CM | POA: Diagnosis not present

## 2015-08-27 DIAGNOSIS — I119 Hypertensive heart disease without heart failure: Secondary | ICD-10-CM | POA: Diagnosis not present

## 2015-08-27 DIAGNOSIS — R0602 Shortness of breath: Secondary | ICD-10-CM

## 2015-08-27 DIAGNOSIS — I340 Nonrheumatic mitral (valve) insufficiency: Secondary | ICD-10-CM | POA: Insufficient documentation

## 2015-08-27 DIAGNOSIS — E785 Hyperlipidemia, unspecified: Secondary | ICD-10-CM | POA: Insufficient documentation

## 2015-08-27 DIAGNOSIS — G4733 Obstructive sleep apnea (adult) (pediatric): Secondary | ICD-10-CM | POA: Insufficient documentation

## 2015-08-27 DIAGNOSIS — R002 Palpitations: Secondary | ICD-10-CM | POA: Insufficient documentation

## 2015-08-27 DIAGNOSIS — I351 Nonrheumatic aortic (valve) insufficiency: Secondary | ICD-10-CM | POA: Insufficient documentation

## 2015-08-27 DIAGNOSIS — Z87891 Personal history of nicotine dependence: Secondary | ICD-10-CM | POA: Insufficient documentation

## 2015-08-27 LAB — EXERCISE TOLERANCE TEST
CHL CUP MPHR: 148 {beats}/min
CHL CUP RESTING HR STRESS: 48 {beats}/min
CSEPEW: 10.4 METS
CSEPPHR: 120 {beats}/min
Exercise duration (min): 10 min
Exercise duration (sec): 16 s
Percent HR: 81 %
RPE: 17

## 2015-09-01 ENCOUNTER — Telehealth: Payer: Self-pay | Admitting: Cardiology

## 2015-09-01 ENCOUNTER — Telehealth (HOSPITAL_COMMUNITY): Payer: Self-pay | Admitting: *Deleted

## 2015-09-01 DIAGNOSIS — R9439 Abnormal result of other cardiovascular function study: Secondary | ICD-10-CM

## 2015-09-01 NOTE — Telephone Encounter (Signed)
Pt aware of orders, instructions and that she will be called to schedule the appt.

## 2015-09-01 NOTE — Telephone Encounter (Signed)
New Message  Pt call requesting to speak with RN about results from test on 8/17. Please call back to discuss

## 2015-09-01 NOTE — Telephone Encounter (Signed)
Left message on voicemail in reference to upcoming appointment scheduled for 09/03/15. Phone number given for a call back so details instructions can be given. Milany Geck, Ranae Palms

## 2015-09-02 ENCOUNTER — Telehealth (HOSPITAL_COMMUNITY): Payer: Self-pay

## 2015-09-02 NOTE — Telephone Encounter (Signed)
Patient given detailed instructions per Myocardial Perfusion Study Information Sheet for the test on 09/03/15 at 0715. Patient notified to arrive 15 minutes early and that it is imperative to arrive on time for appointment to keep from having the test rescheduled.  If you need to cancel or reschedule your appointment, please call the office within 24 hours of your appointment. Failure to do so may result in a cancellation of your appointment, and a $50 no show fee. Patient verbalized understanding. Janifer Adie, CNMT, RT-N

## 2015-09-03 ENCOUNTER — Ambulatory Visit (HOSPITAL_COMMUNITY): Payer: PPO | Attending: Cardiology

## 2015-09-03 DIAGNOSIS — I10 Essential (primary) hypertension: Secondary | ICD-10-CM | POA: Diagnosis not present

## 2015-09-03 DIAGNOSIS — R002 Palpitations: Secondary | ICD-10-CM | POA: Diagnosis not present

## 2015-09-03 DIAGNOSIS — R9439 Abnormal result of other cardiovascular function study: Secondary | ICD-10-CM

## 2015-09-03 LAB — MYOCARDIAL PERFUSION IMAGING
CHL CUP NUCLEAR SDS: 5
CHL CUP NUCLEAR SRS: 3
CHL CUP RESTING HR STRESS: 44 {beats}/min
LV dias vol: 85 mL (ref 46–106)
LV sys vol: 31 mL
NUC STRESS TID: 0.94
Peak HR: 61 {beats}/min
RATE: 0.34
SSS: 8

## 2015-09-03 MED ORDER — TECHNETIUM TC 99M TETROFOSMIN IV KIT
32.4000 | PACK | Freq: Once | INTRAVENOUS | Status: AC | PRN
  Administered 2015-09-03: 32 via INTRAVENOUS
  Filled 2015-09-03: qty 32

## 2015-09-03 MED ORDER — REGADENOSON 0.4 MG/5ML IV SOLN
0.4000 mg | Freq: Once | INTRAVENOUS | Status: AC
  Administered 2015-09-03: 0.4 mg via INTRAVENOUS

## 2015-09-03 MED ORDER — TECHNETIUM TC 99M TETROFOSMIN IV KIT
10.3000 | PACK | Freq: Once | INTRAVENOUS | Status: AC | PRN
  Administered 2015-09-03: 10 via INTRAVENOUS
  Filled 2015-09-03: qty 10

## 2015-09-09 DIAGNOSIS — H11823 Conjunctivochalasis, bilateral: Secondary | ICD-10-CM | POA: Diagnosis not present

## 2015-09-09 DIAGNOSIS — H0289 Other specified disorders of eyelid: Secondary | ICD-10-CM | POA: Diagnosis not present

## 2015-09-09 DIAGNOSIS — H04123 Dry eye syndrome of bilateral lacrimal glands: Secondary | ICD-10-CM | POA: Diagnosis not present

## 2015-09-09 DIAGNOSIS — H2512 Age-related nuclear cataract, left eye: Secondary | ICD-10-CM | POA: Diagnosis not present

## 2015-09-09 DIAGNOSIS — H26491 Other secondary cataract, right eye: Secondary | ICD-10-CM | POA: Diagnosis not present

## 2015-09-09 DIAGNOSIS — Z961 Presence of intraocular lens: Secondary | ICD-10-CM | POA: Diagnosis not present

## 2015-09-10 ENCOUNTER — Telehealth: Payer: Self-pay | Admitting: Cardiology

## 2015-09-10 NOTE — Telephone Encounter (Signed)
Theresa Mejia is returning your call . Please call

## 2015-09-10 NOTE — Telephone Encounter (Signed)
-----   Message from Jerline Pain, MD sent at 09/04/2015  6:42 AM EDT ----- Stop valsartan. (Vagal like reactions, BP likely decreasing).  Reassurance once again. Hydration. Walk.  Make sure thyroid (PCP) is OK.  Follow up with PCP, agree.   Theresa Furbish, MD   ----- Message ----- From: Shellia Cleverly, RN Sent: 09/03/2015   5:40 PM To: Jerline Pain, MD  Pt is disappointed that the results of her nuc study came back normal and would like an answer as to why she has been having the s/s she has been having.  Advised to f/u with PCP however she wanted me to ask you if you have any other possible suggestions so that she may return to her normal life.  Thanks :-/  Theresa Mejia

## 2015-09-10 NOTE — Telephone Encounter (Signed)
Reviewed orders and information with patient.  She states understanding and will stop valsartan.  She will monitor her BP and notify us or her PCP if it becomes elevated.

## 2015-09-23 ENCOUNTER — Ambulatory Visit: Payer: PPO | Admitting: Cardiovascular Disease

## 2015-09-30 DIAGNOSIS — H26491 Other secondary cataract, right eye: Secondary | ICD-10-CM | POA: Diagnosis not present

## 2015-10-09 ENCOUNTER — Telehealth: Payer: Self-pay | Admitting: Cardiology

## 2015-10-09 NOTE — Telephone Encounter (Signed)
New message   Patient c/o Palpitations:  High priority if patient c/o lightheadedness and shortness of breath.  1. How long have you been having palpitations? Forever, this is not anything new  2. Are you currently experiencing lightheadedness and shortness of breath? Yes, have had it before  3. Have you checked your BP and heart rate? (document readings) yes  4. Are you experiencing any other symptoms? No    Pt didn't want first available, didn't want appt with  PA and didn't want to answer Smart phrase questions

## 2015-10-09 NOTE — Telephone Encounter (Signed)
Spoke with pt who is concerned that she continues to have palps and high blood pressure.  Today BP was 156/109 and 178/91 HR 46 bpm.  She describes feeling frustrated at [employer] because people older than her can do more than she can.  Reviewed her medications which she reports she is taking as listed.  Review recent tests results with her as well.  Advised we can get her scheduled to see a PA/ NP sooner than we can Dr Marlou Porch however she would prefer to see him.  Also advised her to follow up with her PCP for because they maybe able to give her a sooner appt and her also capable of treating her HTN.  Pt states understanding and will call PCP office.  She states "I just want someone to either say here is a pill to fix it or this is the way it's going to be, live with it."  Reassurance given. Review information with Dr Marlou Porch who gave orders for the pt to be referred to EP to see if there is anything that can be done.   It is difficult to treat palps with a betablocker when HR drops into the 40's.   Pt should remain hydrated and take medications as listed, f/u with PCP.

## 2015-10-14 NOTE — Telephone Encounter (Signed)
Left message for pt to c/b to be scheduled to see EP as ordered by Dr Marlou Porch.

## 2015-10-15 NOTE — Telephone Encounter (Signed)
Pt called back and was scheduled to see Dr Lovena Le 10/9 at 10:15 am.

## 2015-10-19 ENCOUNTER — Encounter: Payer: Self-pay | Admitting: Internal Medicine

## 2015-10-19 ENCOUNTER — Ambulatory Visit (INDEPENDENT_AMBULATORY_CARE_PROVIDER_SITE_OTHER): Payer: PPO | Admitting: Internal Medicine

## 2015-10-19 ENCOUNTER — Telehealth: Payer: Self-pay | Admitting: Internal Medicine

## 2015-10-19 VITALS — BP 134/82 | HR 51 | Ht 60.0 in | Wt 154.2 lb

## 2015-10-19 DIAGNOSIS — R001 Bradycardia, unspecified: Secondary | ICD-10-CM | POA: Diagnosis not present

## 2015-10-19 DIAGNOSIS — R002 Palpitations: Secondary | ICD-10-CM | POA: Diagnosis not present

## 2015-10-19 DIAGNOSIS — I495 Sick sinus syndrome: Secondary | ICD-10-CM

## 2015-10-19 DIAGNOSIS — Z01812 Encounter for preprocedural laboratory examination: Secondary | ICD-10-CM

## 2015-10-19 NOTE — Patient Instructions (Addendum)
Medication Instructions:  Your physician recommends that you continue on your current medications as directed. Please refer to the Current Medication list given to you today.   Labwork: BMET/CBC 1-2 weeks prior   Testing/Procedures: Your physician has recommended that you have a pacemaker inserted. A pacemaker is a small device that is placed under the skin of your chest or abdomen to help control abnormal heart rhythms. This device uses electrical pulses to prompt the heart to beat at a normal rate. Pacemakers are used to treat heart rhythms that are too slow. Wire (leads) are attached to the pacemaker that goes into the chambers of you heart. This is done in the hospital and usually requires and overnight stay. Please see the instruction sheet given to you today for more information.   Possible dates: 10/20, 10/26, 10/31, 11/1, 11/6, 11/14, 11/16, 11/20, 11/22, 11/30, 12/1, 12/5, 12/8, 12/11, 12/14, 12/20  Call our office to schedule - [phone]  Follow-Up: Follow-up to be determined   Any Other Special Instructions Will Be Listed Below (If Applicable).     If you need a refill on your cardiac medications before your next appointment, please call your pharmacy.

## 2015-10-19 NOTE — Telephone Encounter (Signed)
Follow Up:     Calling to schedule her procedure.

## 2015-10-19 NOTE — Progress Notes (Signed)
HPI Theresa Mejia is referred today by Dr. Marlou Porch for evaluation of palpitations and chronotropic incompetence. She is a pleasant 75 yo woman who c/o fatigue, weakness and palpitations. She has not had syncope. She has worn a 48 hour heart monitor demonstrating an average HR of 48/min and peak HR of 90 on one day and 72 on another. She has fatigue with exertion. She denies chest pain. No edema.  Allergies  Allergen Reactions  . Ramipril     REACTION: pt states INTOLERANT to Altace  . Sulfa Antibiotics     Says it dropped her blood pressure really low.   . Sulfonamide Derivatives     REACTION: hypotension     Current Outpatient Prescriptions  Medication Sig Dispense Refill  . Cholecalciferol (VITAMIN D3) 2000 UNITS TABS Take 1 capsule by mouth daily.     [name] FLUoxetine (PROZAC) 40 MG capsule TAKE 1 CAPSULE (40 MG TOTAL) BY MOUTH DAILY. 90 capsule 1  . fluticasone (FLONASE) 50 MCG/ACT nasal spray Place 2 sprays into both nostrils daily. 16 g 3  . guaiFENesin (MUCINEX) 600 MG 12 hr tablet Take 1,200 mg by mouth 2 (two) times daily as needed for cough or to loosen phlegm. Reported on 06/04/2015    . levothyroxine (SYNTHROID, LEVOTHROID) 125 MCG tablet Patient takes 1/2 tablet by mouth daily.    [name] Lifitegrast (XIIDRA) 5 % SOLN Place 1 drop into both eyes 2 (two) times daily.    [name] loratadine (CLARITIN) 10 MG tablet Take 10 mg by mouth daily as needed for allergies.    . valsartan (DIOVAN) 160 MG tablet TAKE 1 TABLET BY MOUTH DAILY. 90 tablet 3   No current facility-administered medications for this visit.      Past Medical History:  Diagnosis Date  . Allergic rhinitis   . Depression   . DJD (degenerative joint disease)   . Headache(784.0)   . Hypercholesteremia   . Hypersomnia   . Hypertension   . Hypothyroidism   . Internal hemorrhoid 11/2008   s/p banding ([facility])  . Lumbar disc disease   . OBSTRUCTIVE SLEEP APNEA    NPSG 04/29/07- AHI 1.1/hr, RDI 21.2/hr. Weight 176 lbs.  CPAP was tried based on the RDI.    [name] OVERACTIVE BLADDER   . Reactive depression (situational)   . Spondylosis, cervical     ROS:   All systems reviewed and negative except as noted in the HPI.   Past Surgical History:  Procedure Laterality Date  . KNEE ARTHROSCOPY     Dr. [name]  . rotator cuff surgery     Dr. [name]  . TUBAL LIGATION       Family History  Problem Relation Age of Onset  . Crohn's disease Father 56  . Macular degeneration Mother   . Cancer Mother 67    bladder  . Hypothyroidism Mother   . Tremor Mother   . Stroke Mother 21  . Parkinsonism Maternal Grandmother      Social History   Social History  . Marital status: Married    Spouse name: N/A  . Number of children: 2  . Years of education: N/A   Occupational History  .  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 Smoking status: Former Smoker    Quit date: 01/11/1968  . Smokeless tobacco: Never Used  . Alcohol use No  . Drug use: No  . Sexual activity: Not on file  Other Topics Concern  . Not on file   Social History Narrative   married, lives with spouse -   Retired 05/2013     BP 134/82   Pulse (!) 51   Ht 5' (1.524 m)   Wt 154 lb 3.2 oz (69.9 kg)   BMI 30.12 kg/m   Physical Exam:  Well appearing 75 yo woman, NAD HEENT: Unremarkable Neck:  No JVD, no thyromegally Lymphatics:  No adenopathy Back:  No CVA tenderness Lungs:  Clear with no wheezes. HEART:  Regular rate rhythm, no murmurs, no rubs, no clicks Abd:  soft, positive bowel sounds, no organomegally, no rebound, no guarding Ext:  2 plus pulses, no edema, no cyanosis, no clubbing Skin:  No rashes no nodules Neuro:  CN II through XII intact, motor grossly intact  48 hour holter - reviewed by me Stress test - reviewed by me  Assess/Plan: 1. Sinus node dysfunction - she has some discordant information. She increased her heart rate while walking on treadmill but did not when  she wore the heart monitor with an Ave HR of 48/min. I suspectect she has sinus node dysfunction which is symptomatic. SHe will feel better with a HR in the 60-70 ranged rather than the 40's.  2. Palpitations - no obvious evidence of atrial fib. On the heart monitor she had PVC's and PAC and ns atrial tachycardia. After her PPM is inserted, would consider AA drug therapy pending a determination of her burden of ectopy and correlation with symptoms. 3. HTN - her blood pressure is reasonably well controlled. Will follow.  Theresa Mejia.D.

## 2015-10-20 NOTE — Telephone Encounter (Signed)
Pt calling back regarding message she left yesterday-she wanted to schedule pacemaker implant and would like 10-26 if still available , pt aware nurse off today and will call Wednesday @ [phone].

## 2015-10-21 ENCOUNTER — Ambulatory Visit: Payer: PPO | Admitting: Cardiology

## 2015-10-21 NOTE — Telephone Encounter (Signed)
Call pt to schedule PPM implant. Scheduled for 11/11/15 at 10:30 AM and lab appt at [facility] location for BMET/CBC on 11/04/15 at 11:00 AM. Reviewed the following information:   A scheduler will call to set up a wound check in the [facility] 10-14 days from 11/11/15 and with Dr. Lovena Le 91 days from 11/11/15.  Please wash with the CHG Soap the night before and morning of procedure (follow instruction page "Preparing For Surgery"). Please ask to speak with Dr, Tanna Furry nurse when you come in for lab work on 11/04/15. I will give you the CHG Soap and instruction page.  Report to the [facility] on 11/11/15 at 10:30 AM  Nothing to eat or drink after midnight the night before procedure  Do not take any medication prior to procedure - EXCEPT Synthroid  - it may be taken the morning of procedure with a sip of water.   Plan 1 night stay  Pt was pleasant and verbalized understanding.

## 2015-11-04 ENCOUNTER — Other Ambulatory Visit (INDEPENDENT_AMBULATORY_CARE_PROVIDER_SITE_OTHER): Payer: PPO | Admitting: *Deleted

## 2015-11-04 ENCOUNTER — Telehealth: Payer: Self-pay | Admitting: Internal Medicine

## 2015-11-04 DIAGNOSIS — Z01812 Encounter for preprocedural laboratory examination: Secondary | ICD-10-CM

## 2015-11-04 DIAGNOSIS — Z79899 Other long term (current) drug therapy: Secondary | ICD-10-CM | POA: Diagnosis not present

## 2015-11-04 LAB — BASIC METABOLIC PANEL
BUN: 16 mg/dL (ref 7–25)
CALCIUM: 9.1 mg/dL (ref 8.6–10.4)
CO2: 29 mmol/L (ref 20–31)
CREATININE: 0.7 mg/dL (ref 0.60–0.93)
Chloride: 102 mmol/L (ref 98–110)
Glucose, Bld: 74 mg/dL (ref 65–99)
Potassium: 4 mmol/L (ref 3.5–5.3)
Sodium: 139 mmol/L (ref 135–146)

## 2015-11-04 LAB — CBC WITH DIFFERENTIAL/PLATELET
BASOS ABS: 0 {cells}/uL (ref 0–200)
Basophils Relative: 0 %
EOS PCT: 2 %
Eosinophils Absolute: 104 cells/uL (ref 15–500)
HCT: 40.4 % (ref 35.0–45.0)
Hemoglobin: 13.4 g/dL (ref 11.7–15.5)
LYMPHS PCT: 26 %
Lymphs Abs: 1352 cells/uL (ref 850–3900)
MCH: 29.1 pg (ref 27.0–33.0)
MCHC: 33.2 g/dL (ref 32.0–36.0)
MCV: 87.6 fL (ref 80.0–100.0)
MONOS PCT: 10 %
MPV: 9.7 fL (ref 7.5–12.5)
Monocytes Absolute: 520 cells/uL (ref 200–950)
NEUTROS ABS: 3224 {cells}/uL (ref 1500–7800)
Neutrophils Relative %: 62 %
PLATELETS: 296 10*3/uL (ref 140–400)
RBC: 4.61 MIL/uL (ref 3.80–5.10)
RDW: 14.2 % (ref 11.0–15.0)
WBC: 5.2 10*3/uL (ref 3.8–10.8)

## 2015-11-04 NOTE — Telephone Encounter (Signed)
Called, spoke with pt. Pt had questions r/t upcoming Pacemaker implant on 11/11/15. Reviewed the follow: (pt stopped by today and picked up the CHG Soap and instruction page) Please wash with the CHG Soap the night before and morning of procedure (follow Instruction page "Preparing For Surgery").   Report to the [place] [facility] on 11/11/15 at 10:30 AM  Nothing to eat or drink after midnight the night before procedure  Do not take any medication prior to procedure - EXCEPT Synthroid  - it may be taken the morning of procedure with a sip of water.  Pt verbalized understanding and agreed with plan. Pt thanked me for reviewing information.

## 2015-11-04 NOTE — Telephone Encounter (Signed)
New message    Patient has couple of questions regarding upcoming procedure.

## 2015-11-11 ENCOUNTER — Encounter (HOSPITAL_COMMUNITY)
Admission: RE | Disposition: A | Discharge status: Home / Self Care | Payer: Self-pay | Source: Ambulatory Visit | Attending: Internal Medicine

## 2015-11-11 ENCOUNTER — Ambulatory Visit (HOSPITAL_COMMUNITY)
Admission: RE | Admit: 2015-11-11 | Discharge: 2015-11-12 | Disposition: A | Discharge status: Home / Self Care | Payer: PPO | Source: Ambulatory Visit | Attending: Internal Medicine | Admitting: Internal Medicine

## 2015-11-11 ENCOUNTER — Encounter (HOSPITAL_COMMUNITY): Payer: Self-pay | Admitting: Internal Medicine

## 2015-11-11 DIAGNOSIS — J309 Allergic rhinitis, unspecified: Secondary | ICD-10-CM | POA: Insufficient documentation

## 2015-11-11 DIAGNOSIS — G4733 Obstructive sleep apnea (adult) (pediatric): Secondary | ICD-10-CM | POA: Insufficient documentation

## 2015-11-11 DIAGNOSIS — Z87891 Personal history of nicotine dependence: Secondary | ICD-10-CM | POA: Insufficient documentation

## 2015-11-11 DIAGNOSIS — I10 Essential (primary) hypertension: Secondary | ICD-10-CM | POA: Diagnosis not present

## 2015-11-11 DIAGNOSIS — F329 Major depressive disorder, single episode, unspecified: Secondary | ICD-10-CM | POA: Diagnosis not present

## 2015-11-11 DIAGNOSIS — E039 Hypothyroidism, unspecified: Secondary | ICD-10-CM | POA: Diagnosis not present

## 2015-11-11 DIAGNOSIS — Z79899 Other long term (current) drug therapy: Secondary | ICD-10-CM | POA: Diagnosis not present

## 2015-11-11 DIAGNOSIS — Z950 Presence of cardiac pacemaker: Secondary | ICD-10-CM

## 2015-11-11 DIAGNOSIS — I495 Sick sinus syndrome: Secondary | ICD-10-CM

## 2015-11-11 HISTORY — DX: Gastro-esophageal reflux disease without esophagitis: K21.9

## 2015-11-11 HISTORY — DX: Presence of cardiac pacemaker: Z95.0

## 2015-11-11 HISTORY — PX: INSERT / REPLACE / REMOVE PACEMAKER: SUR710

## 2015-11-11 HISTORY — PX: EP IMPLANTABLE DEVICE: SHX172B

## 2015-11-11 HISTORY — DX: Sick sinus syndrome: I49.5

## 2015-11-11 LAB — SURGICAL PCR SCREEN
MRSA, PCR: NEGATIVE
Staphylococcus aureus: NEGATIVE

## 2015-11-11 SURGERY — PACEMAKER IMPLANT

## 2015-11-11 MED ORDER — FLUTICASONE PROPIONATE 50 MCG/ACT NA SUSP
2.0000 | Freq: Every day | NASAL | Status: DC
  Filled 2015-11-11: qty 16

## 2015-11-11 MED ORDER — LORATADINE 10 MG PO TABS: 10.0000 mg | ORAL_TABLET | Freq: Every day | ORAL | Status: DC | PRN

## 2015-11-11 MED ORDER — MUPIROCIN 2 % EX OINT
TOPICAL_OINTMENT | Freq: Once | CUTANEOUS | Status: AC
  Administered 2015-11-11: 11:00:00 via NASAL

## 2015-11-11 MED ORDER — CEFAZOLIN SODIUM-DEXTROSE 2-4 GM/100ML-% IV SOLN
2.0000 g | INTRAVENOUS | Status: AC
  Administered 2015-11-11: 2 g via INTRAVENOUS

## 2015-11-11 MED ORDER — VITAMIN B-12 1000 MCG PO TABS: 5000.0000 ug | ORAL_TABLET | Freq: Every day | ORAL | Status: DC

## 2015-11-11 MED ORDER — NAPROXEN 250 MG PO TABS
250.0000 mg | ORAL_TABLET | Freq: Two times a day (BID) | ORAL | Status: DC | PRN
  Administered 2015-11-11 – 2015-11-12 (×2): 250 mg via ORAL
  Filled 2015-11-11 (×2): qty 1

## 2015-11-11 MED ORDER — CEFAZOLIN IN D5W 1 GM/50ML IV SOLN
1.0000 g | Freq: Four times a day (QID) | INTRAVENOUS | Status: AC
  Administered 2015-11-11 – 2015-11-12 (×3): 1 g via INTRAVENOUS
  Filled 2015-11-11 (×3): qty 50

## 2015-11-11 MED ORDER — LEVOTHYROXINE SODIUM 50 MCG PO TABS
62.5000 ug | ORAL_TABLET | Freq: Every day | ORAL | Status: DC
  Administered 2015-11-12: 62.5 ug via ORAL
  Filled 2015-11-11: qty 1

## 2015-11-11 MED ORDER — IRBESARTAN 150 MG PO TABS
150.0000 mg | ORAL_TABLET | Freq: Every day | ORAL | Status: DC
  Administered 2015-11-11: 150 mg via ORAL
  Filled 2015-11-11 (×2): qty 1

## 2015-11-11 MED ORDER — HYDROCODONE-ACETAMINOPHEN 5-325 MG PO TABS
1.0000 | ORAL_TABLET | Freq: Four times a day (QID) | ORAL | Status: DC | PRN
  Administered 2015-11-11: 1 via ORAL
  Filled 2015-11-11: qty 1

## 2015-11-11 MED ORDER — SODIUM CHLORIDE 0.9 % IR SOLN
80.0000 mg | Status: AC
  Administered 2015-11-11: 80 mg

## 2015-11-11 MED ORDER — MIDAZOLAM HCL 5 MG/5ML IJ SOLN
INTRAMUSCULAR | Status: AC
  Filled 2015-11-11: qty 5

## 2015-11-11 MED ORDER — NAPROXEN SODIUM 220 MG PO TABS: 220.0000 mg | ORAL_TABLET | Freq: Two times a day (BID) | ORAL | Status: DC | PRN

## 2015-11-11 MED ORDER — ONDANSETRON HCL 4 MG/2ML IJ SOLN: 4.0000 mg | Freq: Four times a day (QID) | INTRAMUSCULAR | Status: DC | PRN

## 2015-11-11 MED ORDER — HEPARIN (PORCINE) IN NACL 2-0.9 UNIT/ML-% IJ SOLN
INTRAMUSCULAR | Status: DC | PRN
  Administered 2015-11-11: 13:00:00

## 2015-11-11 MED ORDER — FENTANYL CITRATE (PF) 100 MCG/2ML IJ SOLN
INTRAMUSCULAR | Status: DC | PRN
  Administered 2015-11-11: 12.5 ug via INTRAVENOUS
  Administered 2015-11-11: 25 ug via INTRAVENOUS

## 2015-11-11 MED ORDER — FAMOTIDINE 20 MG PO TABS
20.0000 mg | ORAL_TABLET | Freq: Every day | ORAL | Status: DC
  Administered 2015-11-11: 20 mg via ORAL
  Filled 2015-11-11: qty 1

## 2015-11-11 MED ORDER — GENTAMICIN SULFATE 40 MG/ML IJ SOLN
INTRAMUSCULAR | Status: AC
  Filled 2015-11-11: qty 2

## 2015-11-11 MED ORDER — CLONAZEPAM 0.5 MG PO TABS: 0.1250 mg | ORAL_TABLET | ORAL | Status: DC

## 2015-11-11 MED ORDER — CLONAZEPAM 0.5 MG PO TABS: 0.2500 mg | ORAL_TABLET | Freq: Every day | ORAL | Status: DC | PRN

## 2015-11-11 MED ORDER — GUAIFENESIN ER 600 MG PO TB12: 600.0000 mg | ORAL_TABLET | Freq: Two times a day (BID) | ORAL | Status: DC | PRN

## 2015-11-11 MED ORDER — CEFAZOLIN SODIUM-DEXTROSE 2-4 GM/100ML-% IV SOLN
INTRAVENOUS | Status: AC
  Filled 2015-11-11: qty 100

## 2015-11-11 MED ORDER — HEPARIN (PORCINE) IN NACL 2-0.9 UNIT/ML-% IJ SOLN
INTRAMUSCULAR | Status: AC
  Filled 2015-11-11: qty 500

## 2015-11-11 MED ORDER — MUPIROCIN 2 % EX OINT
TOPICAL_OINTMENT | CUTANEOUS | Status: AC
  Filled 2015-11-11: qty 22

## 2015-11-11 MED ORDER — FENTANYL CITRATE (PF) 100 MCG/2ML IJ SOLN
INTRAMUSCULAR | Status: AC
  Filled 2015-11-11: qty 2

## 2015-11-11 MED ORDER — LIDOCAINE HCL (PF) 1 % IJ SOLN
INTRAMUSCULAR | Status: DC | PRN
  Administered 2015-11-11: 50 mL via INTRADERMAL

## 2015-11-11 MED ORDER — ACETAMINOPHEN 325 MG PO TABS
325.0000 mg | ORAL_TABLET | ORAL | Status: DC | PRN
  Administered 2015-11-11 – 2015-11-12 (×2): 650 mg via ORAL
  Filled 2015-11-11 (×2): qty 2

## 2015-11-11 MED ORDER — CHLORHEXIDINE GLUCONATE 4 % EX LIQD: 60.0000 mL | Freq: Once | CUTANEOUS | Status: DC

## 2015-11-11 MED ORDER — VITAMIN D 1000 UNITS PO TABS: 1000.0000 [IU] | ORAL_TABLET | Freq: Every day | ORAL | Status: DC

## 2015-11-11 MED ORDER — SODIUM CHLORIDE 0.9 % IV SOLN
INTRAVENOUS | Status: DC
  Administered 2015-11-11: 12:00:00 via INTRAVENOUS

## 2015-11-11 MED ORDER — ACETAMINOPHEN 500 MG PO TABS: 1000.0000 mg | ORAL_TABLET | Freq: Four times a day (QID) | ORAL | Status: DC | PRN

## 2015-11-11 MED ORDER — LIDOCAINE HCL (PF) 1 % IJ SOLN
INTRAMUSCULAR | Status: AC
  Filled 2015-11-11: qty 60

## 2015-11-11 MED ORDER — MIDAZOLAM HCL 5 MG/5ML IJ SOLN
INTRAMUSCULAR | Status: DC | PRN
  Administered 2015-11-11 (×3): 1 mg via INTRAVENOUS

## 2015-11-11 SURGICAL SUPPLY — 7 items
CABLE SURGICAL S-101-97-12 (CABLE) ×3 IMPLANT
LEAD SOLIA S PRO MRI 45 (Lead) ×3 IMPLANT
LEAD SOLIA S PRO MRI 53 (Lead) ×3 IMPLANT
PAD DEFIB LIFELINK (PAD) ×3 IMPLANT
PPM ELUNA DR-T 394969 (Pacemaker) ×3 IMPLANT
SHEATH CLASSIC 7F (SHEATH) ×6 IMPLANT
TRAY PACEMAKER INSERTION (PACKS) ×3 IMPLANT

## 2015-11-11 NOTE — H&P (View-Only) (Signed)
HPI Theresa Mejia is referred today by Dr. Marlou Porch for evaluation of palpitations and chronotropic incompetence. She is a pleasant 75 yo woman who c/o fatigue, weakness and palpitations. She has not had syncope. She has worn a 48 hour heart monitor demonstrating an average HR of 48/min and peak HR of 90 on one day and 72 on another. She has fatigue with exertion. She denies chest pain. No edema.  Allergies  Allergen Reactions  . Ramipril     REACTION: pt states INTOLERANT to Altace  . Sulfa Antibiotics     Says it dropped her blood pressure really low.   . Sulfonamide Derivatives     REACTION: hypotension     Current Outpatient Prescriptions  Medication Sig Dispense Refill  . Cholecalciferol (VITAMIN D3) 2000 UNITS TABS Take 1 capsule by mouth daily.     [name] FLUoxetine (PROZAC) 40 MG capsule TAKE 1 CAPSULE (40 MG TOTAL) BY MOUTH DAILY. 90 capsule 1  . fluticasone (FLONASE) 50 MCG/ACT nasal spray Place 2 sprays into both nostrils daily. 16 g 3  . guaiFENesin (MUCINEX) 600 MG 12 hr tablet Take 1,200 mg by mouth 2 (two) times daily as needed for cough or to loosen phlegm. Reported on 06/04/2015    . levothyroxine (SYNTHROID, LEVOTHROID) 125 MCG tablet Patient takes 1/2 tablet by mouth daily.    [name] Lifitegrast (XIIDRA) 5 % SOLN Place 1 drop into both eyes 2 (two) times daily.    [name] loratadine (CLARITIN) 10 MG tablet Take 10 mg by mouth daily as needed for allergies.    . valsartan (DIOVAN) 160 MG tablet TAKE 1 TABLET BY MOUTH DAILY. 90 tablet 3   No current facility-administered medications for this visit.      Past Medical History:  Diagnosis Date  . Allergic rhinitis   . Depression   . DJD (degenerative joint disease)   . Headache(784.0)   . Hypercholesteremia   . Hypersomnia   . Hypertension   . Hypothyroidism   . Internal hemorrhoid 11/2008   s/p banding ([place])  . Lumbar disc disease   . OBSTRUCTIVE SLEEP APNEA    NPSG 04/29/07- AHI 1.1/hr, RDI 21.2/hr. Weight 176 lbs.  CPAP was tried based on the RDI.    [name] OVERACTIVE BLADDER   . Reactive depression (situational)   . Spondylosis, cervical     ROS:   All systems reviewed and negative except as noted in the HPI.   Past Surgical History:  Procedure Laterality Date  . KNEE ARTHROSCOPY     Dr. [name]  . rotator cuff surgery     Dr. [name]  . TUBAL LIGATION       Family History  Problem Relation Age of Onset  . Crohn's disease Father 20  . Macular degeneration Mother   . Cancer Mother 69    bladder  . Hypothyroidism Mother   . Tremor Mother   . Stroke Mother 57  . Parkinsonism Maternal Grandmother      Social History   Social History  . Marital status: Married    Spouse name: N/A  . Number of children: 2  . Years of education: N/A   Occupational History  . 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 Smoking status: Former Smoker    Quit date: 01/11/1968  . Smokeless tobacco: Never Used  . Alcohol use No  . Drug use: No  . Sexual activity: Not on file  Other Topics Concern  . Not on file   Social History Narrative   married, lives with spouse -   Retired 05/2013     BP 134/82   Pulse (!) 51   Ht 5' (1.524 m)   Wt 154 lb 3.2 oz (69.9 kg)   BMI 30.12 kg/m   Physical Exam:  Well appearing 75 yo woman, NAD HEENT: Unremarkable Neck:  No JVD, no thyromegally Lymphatics:  No adenopathy Back:  No CVA tenderness Lungs:  Clear with no wheezes. HEART:  Regular rate rhythm, no murmurs, no rubs, no clicks Abd:  soft, positive bowel sounds, no organomegally, no rebound, no guarding Ext:  2 plus pulses, no edema, no cyanosis, no clubbing Skin:  No rashes no nodules Neuro:  CN II through XII intact, motor grossly intact  48 hour holter - reviewed by me Stress test - reviewed by me  Assess/Plan: 1. Sinus node dysfunction - she has some discordant information. She increased her heart rate while walking on treadmill but did not when  she wore the heart monitor with an Ave HR of 48/min. I suspectect she has sinus node dysfunction which is symptomatic. SHe will feel better with a HR in the 60-70 ranged rather than the 40's.  2. Palpitations - no obvious evidence of atrial fib. On the heart monitor she had PVC's and PAC and ns atrial tachycardia. After her PPM is inserted, would consider AA drug therapy pending a determination of her burden of ectopy and correlation with symptoms. 3. HTN - her blood pressure is reasonably well controlled. Will follow.  Mikle Bosworth.D.

## 2015-11-11 NOTE — Interval H&P Note (Signed)
History and Physical Interval Note:  11/11/2015 11:51 AM  Theresa Mejia  has presented today for surgery, with the diagnosis of bradicardia  The various methods of treatment have been discussed with the patient and family. After consideration of risks, benefits and other options for treatment, the patient has consented to  Procedure(s): Pacemaker Implant (N/A) as a surgical intervention .  The patient's history has been reviewed, patient examined, no change in status, stable for surgery.  I have reviewed the patient's chart and labs.  Questions were answered to the patient's satisfaction.     Cristopher Peru

## 2015-11-12 ENCOUNTER — Ambulatory Visit (HOSPITAL_COMMUNITY): Payer: PPO

## 2015-11-12 DIAGNOSIS — J309 Allergic rhinitis, unspecified: Secondary | ICD-10-CM | POA: Diagnosis not present

## 2015-11-12 DIAGNOSIS — Z950 Presence of cardiac pacemaker: Secondary | ICD-10-CM | POA: Diagnosis not present

## 2015-11-12 DIAGNOSIS — I495 Sick sinus syndrome: Secondary | ICD-10-CM

## 2015-11-12 DIAGNOSIS — Z87891 Personal history of nicotine dependence: Secondary | ICD-10-CM | POA: Diagnosis not present

## 2015-11-12 DIAGNOSIS — I10 Essential (primary) hypertension: Secondary | ICD-10-CM | POA: Diagnosis not present

## 2015-11-12 DIAGNOSIS — G4733 Obstructive sleep apnea (adult) (pediatric): Secondary | ICD-10-CM | POA: Diagnosis not present

## 2015-11-12 DIAGNOSIS — E039 Hypothyroidism, unspecified: Secondary | ICD-10-CM | POA: Diagnosis not present

## 2015-11-12 DIAGNOSIS — Z79899 Other long term (current) drug therapy: Secondary | ICD-10-CM | POA: Diagnosis not present

## 2015-11-12 DIAGNOSIS — F329 Major depressive disorder, single episode, unspecified: Secondary | ICD-10-CM | POA: Diagnosis not present

## 2015-11-12 NOTE — Discharge Instructions (Signed)
Supplemental Discharge Instructions for  Pacemaker/Defibrillator Patients  Activity No heavy lifting or vigorous activity with your left/right arm for 6 to 8 weeks.  Do not raise your left/right arm above your head for one week.  Gradually raise your affected arm as drawn below.           __       11/16/15                     11/17/15                  11/18/15                  11/19/15  NO DRIVING for   1 week  ; you may begin driving on  [id number]   .  WOUND CARE - Keep the wound area clean and dry.  Do not get this area wet for one week. No showers for one week; you may shower on  11/19/15   . - The tape/steri-strips on your wound will fall off; do not pull them off.  No bandage is needed on the site.  DO  NOT apply any creams, oils, or ointments to the wound area. - If you notice any drainage or discharge from the wound, any swelling or bruising at the site, or you develop a fever > 101? F after you are discharged home, call the office at once.  Special Instructions - You are still able to use cellular telephones; use the ear opposite the side where you have your pacemaker/defibrillator.  Avoid carrying your cellular phone near your device. - When traveling through airports, show security personnel your identification card to avoid being screened in the metal detectors.  Ask the security personnel to use the hand wand. - Avoid arc welding equipment, MRI testing (magnetic resonance imaging), TENS units (transcutaneous nerve stimulators).  Call the office for questions about other devices. - Avoid electrical appliances that are in poor condition or are not properly grounded. - Microwave ovens are safe to be near or to operate.    Pacemaker Implantation, Care After Refer to this sheet over the next few weeks. These instructions provide you with information on caring for yourself after the procedure. Your health care provider may also give you more specific instructions. Your treatment has  been planned according to current medical practices, but problems sometimes occur. Call your health care provider if you have any problems or questions regarding your pacemaker.  WHAT TO EXPECT AFTER THE PROCEDURE  You may feel pain. Some pain is normal. It may last a few days.  A slight bump may be seen over the skin where the device was placed. Sometimes, it is possible to feel the device under the skin. This is normal.  In the months and years afterward, your health care provider will check the device, the leads, and the battery every few months. Eventually, when the battery is low, the device will be replaced. HOME CARE INSTRUCTIONS Medicines  Take medicines only as directed by your health care provider.  If you were prescribed an antibiotic medicine, finish it all even if you start to feel better.  Do not take any other medicines without asking your health care provider first. Some medicines, including certain painkillers, can cause bleeding in your stomach after surgery. Wound Care  Do not remove the bandage on your chest until directed to do so by your health care provider.  After your bandage  is removed, you may see pieces of tape called skin adhesive strips over the area where the cut was made (incision site). Let them fall off on their own.  Check the incision site every day to make sure it is not infected, bleeding, or starting to pull apart.  Do not use lotions or ointments near the incision site unless directed to do so.  Keep the incision area clean and dry for 2-3 days after the procedure or as directed by your health care provider. It takes several weeks for the incision site to completely heal.  Do not take baths, swim, or use a hot tub until your health care provider approves. Activities  Try to walk a little every day. Exercising is important after this procedure. It is also important to use your shoulder on the side of the pacemaker in daily tasks that do not  require exaggerated motion.  Avoid sudden jerking, pulling, or chopping movements that pull your upper arm far away from your body for at least 6 weeks.  Do not lift your upper arm above your shoulders for at least 6 weeks. This means no tennis, golf, or swimming for this period of time. If you sleep with the arm above your head, use a restraint to prevent this from happening as you sleep.  You may go back to work when your health care provider says it is okay. Check with your health care provider before you start to drive or play sports. Other Instructions  Follow diet instructions if they were provided. You should be able to eat what you usually do right away, but you may need to limit your salt intake.  Weigh yourself every day. If you suddenly gain weight, fluid may be building up in your body.  Always carry your pacemaker identification card with you. The card should list the implant date, device model, and manufacturer. Consider wearing a medical alert bracelet or necklace.  Tell all health care providers that you have a pacemaker. This may prevent them from giving you a magnetic resource imaging scan (MRI) because of the strong magnets used during that test.  If you must pass through a metal detector, quickly walk through it. Do not stop under the detector or stand near it.  Avoid places or objects with a strong electric or magnetic field, including:  Engineer, maintenance. When at the airport, let officials know you have a pacemaker. Your ID card will let you be checked in a way that is safe for you and that will not damage your pacemaker. Also, do not let a security person wave a magnetic wand near your pacemaker. That can make it stop working.  Power plants.  Large electrical generators.  Radiofrequency transmission towers, such as cell phone and radio towers.  Do not use amateur (ham) radio equipment or electric (arc) welding torches. Some devices are safe to use if held at  least 1 foot from your pacemaker. These include power tools, lawn mowers, and speakers. If you are unsure of whether something is safe to use, ask your health care provider.  You may safely use electric blankets, heating pads, computers, and microwave ovens.  When using your cell phone, hold it to the ear opposite the pacemaker. Do not leave your cell phone in a pocket over the pacemaker.  Keep all follow-up visits as directed by your health care provider. This is how your health care provider makes sure your chest is healing the way it should. Ask your health  care provider when you should come back to have your stitches or staples taken out.  Have your pacemaker checked every 3-6 months or as directed by your health care provider. Most pacemakers last for 4-8 years before a new one is needed. SEEK MEDICAL CARE IF:  You gain weight suddenly.  Your legs or feet swell more than they have before.  It feels like your heart is fluttering or skipping beats (heart palpitations).  You have a fever. SEEK IMMEDIATE MEDICAL CARE IF:  You have chest pain.  You feel more short of breath than you have felt before.  You feel more light-headed than you have felt before.  You have problems with your incision site, such as swelling or bleeding, or it starts to open up.  You have drainage, redness, swelling, or pain at your incision site.   This information is not intended to replace advice given to you by your health care provider. Make sure you discuss any questions you have with your health care provider.   Document Released: 07/16/2004 Document Revised: 01/17/2014 Document Reviewed: 04/29/2011 Elsevier Interactive Patient Education [employer].

## 2015-11-12 NOTE — Discharge Summary (Signed)
ELECTROPHYSIOLOGY PROCEDURE DISCHARGE SUMMARY    Patient ID: Theresa Mejia,  MRN: [id number], DOB/AGE: 07-24-1940 75 y.o.  Admit date: 11/11/2015 Discharge date: 11/12/2015  Primary Care Physician: [name], MD Primary Cardiologist: Marlou Porch Electrophysiologist: Lovena Le  Primary Discharge Diagnosis:  Symptomatic sinus node dysfunction status post pacemaker implantation this admission  Secondary Discharge Diagnosis:  1.  Palpitations 2.  Hypertension 3.  Hyperlipidemia 4.  Hypothyroidism 5.  DJD  Allergies  Allergen Reactions  . Ramipril     REACTION: pt states INTOLERANT to Altace  . Sulfa Antibiotics     Says it dropped her blood pressure really low.   . Sulfonamide Derivatives     REACTION: hypotension     Procedures This Admission:  1.  Implantation of a Biotronik dual chamber PPM on 11/11/15 by Dr Lovena Le.  See op note for full details.  There were no immediate post procedure complications. 2.  CXR on 11/12/15 demonstrated no pneumothorax status post device implantation.   Brief HPI: Theresa Mejia is a 75 y.o. female was referred to electrophysiology in the outpatient setting for consideration of PPM implantation.  Past medical history includes chronotropic incompetence, hypertension, and palpitations.  The patient has had symptomatic bradycardia without reversible causes identified.  Risks, benefits, and alternatives to PPM implantation were reviewed with the patient who wished to proceed.   Hospital Course:  The patient was admitted and underwent implantation of a Biotronik dual chamber pacemaker with details as outlined above.  She  was monitored on telemetry overnight which demonstrated atrial pacing with intrinsic ventricular conduction.  Left chest was without hematoma or ecchymosis.  The device was interrogated and found to be functioning normally.  CXR was obtained and demonstrated no pneumothorax status post device implantation.  Wound care, arm mobility, and  restrictions were reviewed with the patient.  The patient was examined and considered stable for discharge to home.    Physical Exam: Vitals:   11/11/15 1538 11/11/15 1708 11/11/15 2000 11/12/15 0500  BP: (!) 161/68 (!) 116/95 130/73 (!) 155/90  Pulse: 60 61 60 61  Resp: 13 16 17 18   Temp: 98.3 F (36.8 C)  98.5 F (36.9 C) 98.4 F (36.9 C)  TempSrc: Oral     SpO2: 96% 94% 98% 96%  Weight:    152 lb 11.2 oz (69.3 kg)  Height:        GEN- The patient is well appearing, alert and oriented x 3 today.   HEENT: normocephalic, atraumatic; sclera clear, conjunctiva pink; hearing intact; oropharynx clear; neck supple  Lungs- Clear to ausculation bilaterally, normal work of breathing.  No wheezes, rales, rhonchi Heart- Regular rate and rhythm GI- soft, non-tender, non-distended, bowel sounds present  Extremities- no clubbing, cyanosis, or edema; DP/PT/radial pulses 2+ bilaterally MS- no significant deformity or atrophy Skin- warm and dry, no rash or lesion, left chest without hematoma/ecchymosis Psych- euthymic mood, full affect Neuro- strength and sensation are intact   Labs:   Lab Results  Component Value Date   WBC 5.2 11/04/2015   HGB 13.4 11/04/2015   HCT 40.4 11/04/2015   MCV 87.6 11/04/2015   PLT 296 11/04/2015   No results for input(s): NA, K, CL, CO2, BUN, CREATININE, CALCIUM, PROT, BILITOT, ALKPHOS, ALT, AST, GLUCOSE in the last 168 hours.  Invalid input(s): LABALBU  Discharge Medications:    Medication List    TAKE these medications   acetaminophen 500 MG tablet Commonly known as:  TYLENOL Take 1,000 mg by mouth  every 6 (six) hours as needed.   ALPRAZolam 0.25 MG tablet Commonly known as:  XANAX Take 0.125 mg by mouth at bedtime as needed for anxiety or sleep.   B-12 5000 MCG Caps Take 5,000 mcg by mouth daily.   BENEFIBER PO Take 2 scoop by mouth daily.   clonazePAM 0.5 MG tablet Commonly known as:  KLONOPIN Take 0.125-0.25 mg by mouth daily as  needed for anxiety.   famotidine 20 MG tablet Commonly known as:  PEPCID Take 20 mg by mouth at bedtime.   FLUoxetine 40 MG capsule Commonly known as:  PROZAC TAKE 1 CAPSULE (40 MG TOTAL) BY MOUTH DAILY.   fluticasone 50 MCG/ACT nasal spray Commonly known as:  FLONASE Place 2 sprays into both nostrils daily.   guaiFENesin 600 MG 12 hr tablet Commonly known as:  MUCINEX Take 600-1,200 mg by mouth 2 (two) times daily as needed for cough or to loosen phlegm. Reported on 06/04/2015   levothyroxine 125 MCG tablet Commonly known as:  SYNTHROID, LEVOTHROID Take 62.5 mcg by mouth daily before breakfast.   loratadine 10 MG tablet Commonly known as:  CLARITIN Take 10 mg by mouth daily as needed for allergies.   naproxen sodium 220 MG tablet Commonly known as:  ANAPROX Take 220 mg by mouth 2 (two) times daily as needed (pain).   REFRESH OP Apply 1 drop to eye 2 (two) times daily. May use additional drops as needed for dry eyes   valsartan 160 MG tablet Commonly known as:  DIOVAN TAKE 1 TABLET BY MOUTH DAILY. What changed:  See the new instructions.   Vitamin D3 2000 units Tabs Take 1 capsule by mouth daily.       Disposition:  Discharge Instructions    Diet - low sodium heart healthy    Complete by:  As directed    Increase activity slowly    Complete by:  As directed      Follow-up Information    [facility] Follow up on 11/25/2015.   Specialty:  Cardiology Why:  at 9:30AM for wound check  Contact information: [address] [place]       Cristopher Peru, MD Follow up on 02/17/2016.   Specialty:  Cardiology Why:  at 10:45AM Contact information: [address] [address] [phone]           Duration of Discharge Encounter: Greater than 30 minutes including physician time.  Signed, Chanetta Marshall, NP 11/12/2015 9:13 AM  EP Attending  Patient seen and  examined. Her device is working normally. DC home. Usual followup.  Mikle Bosworth.D.

## 2015-11-12 NOTE — Progress Notes (Signed)
Reviewed discharge instructions with patient and husband and they stated their understanding. Reviewed arm limitations with patient as well. Discharged home with husband via wheelchair by volunteer.  Sanda Linger

## 2015-11-13 ENCOUNTER — Telehealth: Payer: Self-pay | Admitting: *Deleted

## 2015-11-13 ENCOUNTER — Telehealth: Payer: Self-pay | Admitting: Internal Medicine

## 2015-11-13 NOTE — Telephone Encounter (Signed)
Called pt back and informed her that I had reviewed the information with Dr. Lovena Le and he recommends to take Tylenol as directed and keep the arm elevated and if pain was not better or got any worse to call office on Monday. Also that if she developed fever or chills, drainage from site or that swelling increasing that she was to go to the ER. Pt voiced understanding.

## 2015-11-13 NOTE — Telephone Encounter (Signed)
Pt stated she was having a lot of pain,stated that she had taken 2 Tylenol at 6:30am and that had provided some pain relief then at 11:00am she took aleve, pt stated that worst the pain is 7/10 at best 3/10. Pt stated that her arm is not swollen and she can move her fingers. Pt denied fever, chills. She stated that the site was pink around adhesive and site was not any more swollen than after procedure. Informed pt that information would be reviewed with Dr. Lovena Le she would receive a call back.

## 2015-11-13 NOTE — Telephone Encounter (Signed)
Pt was on TCM list admitted for Symptomatic sinus node dysfunction status post pacemaker implantation. Pt was D/c 11/2, and will f/u w/cardiology 11/25/15...Johny Chess

## 2015-11-13 NOTE — Telephone Encounter (Signed)
w Message:    Pt had a pacemaker put in on Wednesday.The medicine she is taking for pain is not helping.

## 2015-11-16 ENCOUNTER — Ambulatory Visit (INDEPENDENT_AMBULATORY_CARE_PROVIDER_SITE_OTHER): Payer: PPO | Admitting: Psychology

## 2015-11-16 DIAGNOSIS — F4322 Adjustment disorder with anxiety: Secondary | ICD-10-CM | POA: Diagnosis not present

## 2015-11-17 ENCOUNTER — Telehealth: Payer: Self-pay | Admitting: Emergency Medicine

## 2015-11-17 ENCOUNTER — Other Ambulatory Visit: Payer: Self-pay | Admitting: Internal Medicine

## 2015-11-17 DIAGNOSIS — F418 Other specified anxiety disorders: Secondary | ICD-10-CM

## 2015-11-17 MED ORDER — VORTIOXETINE HBR 10 MG PO TABS: 1.0000 | ORAL_TABLET | Freq: Every day | ORAL | 5 refills | Status: DC

## 2015-11-17 NOTE — Telephone Encounter (Signed)
New med sent to her pharmacy She can make the switch at her convenience

## 2015-11-17 NOTE — Telephone Encounter (Signed)
I have ordered a referral to psychiatry for her to look into this further  It sounds like it's a complicated issue

## 2015-11-17 NOTE — Telephone Encounter (Signed)
Please advise 

## 2015-11-17 NOTE — Telephone Encounter (Signed)
Pt called and stated her taking FLUoxetine (PROZAC) 40 MG capsule isnt working. She wants to know if she can be switched to something else. Thanks.

## 2015-11-17 NOTE — Telephone Encounter (Signed)
[employer] card up front and pt is aware.

## 2015-11-17 NOTE — Telephone Encounter (Signed)
Pt informed of rx

## 2015-11-17 NOTE — Telephone Encounter (Signed)
Contacted pt and she stated that she is currently seeing Dr. Cheryln Manly and he recommended the change in medication. Pt also stated that Dr. Cheryln Manly would be getting in touch with [name].

## 2015-11-18 ENCOUNTER — Other Ambulatory Visit: Payer: PPO

## 2015-11-18 ENCOUNTER — Encounter: Payer: Self-pay | Admitting: Internal Medicine

## 2015-11-18 ENCOUNTER — Ambulatory Visit: Payer: PPO

## 2015-11-18 ENCOUNTER — Ambulatory Visit (INDEPENDENT_AMBULATORY_CARE_PROVIDER_SITE_OTHER): Payer: PPO | Admitting: Internal Medicine

## 2015-11-18 VITALS — BP 144/92 | HR 78

## 2015-11-18 DIAGNOSIS — R5383 Other fatigue: Secondary | ICD-10-CM

## 2015-11-18 DIAGNOSIS — R002 Palpitations: Secondary | ICD-10-CM

## 2015-11-18 DIAGNOSIS — I10 Essential (primary) hypertension: Secondary | ICD-10-CM

## 2015-11-18 DIAGNOSIS — E038 Other specified hypothyroidism: Secondary | ICD-10-CM

## 2015-11-18 MED ORDER — TELMISARTAN 80 MG PO TABS: 80.0000 mg | ORAL_TABLET | Freq: Every day | ORAL | 1 refills | Status: DC

## 2015-11-18 NOTE — Progress Notes (Signed)
Pre visit review using our clinic review tool, if applicable. No additional management support is needed unless otherwise documented below in the visit note. 

## 2015-11-18 NOTE — Progress Notes (Deleted)
Pt comes in today for a BP check. Pt had a pace maker put in a week ago. She has been checking BP irregularly however, today she checked her BP and it was 190/166 and 81 BPM.  1st BP 160/90 left arm, sitting, normal cuff and 68 BPM. 2nd BP 144/92 right arm, sitting, normal and 73 BPM

## 2015-11-18 NOTE — Patient Instructions (Signed)
Hypertension Hypertension, commonly called high blood pressure, is when the force of blood pumping through your arteries is too strong. Your arteries are the blood vessels that carry blood from your heart throughout your body. A blood pressure reading consists of a higher number over a lower number, such as 110/72. The higher number (systolic) is the pressure inside your arteries when your heart pumps. The lower number (diastolic) is the pressure inside your arteries when your heart relaxes. Ideally you want your blood pressure below 120/80. Hypertension forces your heart to work harder to pump blood. Your arteries may become narrow or stiff. Having untreated or uncontrolled hypertension can cause heart attack, stroke, kidney disease, and other problems. RISK FACTORS Some risk factors for high blood pressure are controllable. Others are not.  Risk factors you cannot control include:   Race. You may be at higher risk if you are African American.  Age. Risk increases with age.  Gender. Men are at higher risk than women before age 45 years. After age 65, women are at higher risk than men. Risk factors you can control include:  Not getting enough exercise or physical activity.  Being overweight.  Getting too much fat, sugar, calories, or salt in your diet.  Drinking too much alcohol. SIGNS AND SYMPTOMS Hypertension does not usually cause signs or symptoms. Extremely high blood pressure (hypertensive crisis) may cause headache, anxiety, shortness of breath, and nosebleed. DIAGNOSIS To check if you have hypertension, your health care provider will measure your blood pressure while you are seated, with your arm held at the level of your heart. It should be measured at least twice using the same arm. Certain conditions can cause a difference in blood pressure between your right and left arms. A blood pressure reading that is higher than normal on one occasion does not mean that you need treatment. If  it is not clear whether you have high blood pressure, you may be asked to return on a different day to have your blood pressure checked again. Or, you may be asked to monitor your blood pressure at home for 1 or more weeks. TREATMENT Treating high blood pressure includes making lifestyle changes and possibly taking medicine. Living a healthy lifestyle can help lower high blood pressure. You may need to change some of your habits. Lifestyle changes may include:  Following the DASH diet. This diet is high in fruits, vegetables, and whole grains. It is low in salt, red meat, and added sugars.  Keep your sodium intake below 2,300 mg per day.  Getting at least 30-45 minutes of aerobic exercise at least 4 times per week.  Losing weight if necessary.  Not smoking.  Limiting alcoholic beverages.  Learning ways to reduce stress. Your health care provider may prescribe medicine if lifestyle changes are not enough to get your blood pressure under control, and if one of the following is true:  You are 18-59 years of age and your systolic blood pressure is above 140.  You are 60 years of age or older, and your systolic blood pressure is above 150.  Your diastolic blood pressure is above 90.  You have diabetes, and your systolic blood pressure is over 140 or your diastolic blood pressure is over 90.  You have kidney disease and your blood pressure is above 140/90.  You have heart disease and your blood pressure is above 140/90. Your personal target blood pressure may vary depending on your medical conditions, your age, and other factors. HOME CARE INSTRUCTIONS    Have your blood pressure rechecked as directed by your health care provider.   Take medicines only as directed by your health care provider. Follow the directions carefully. Blood pressure medicines must be taken as prescribed. The medicine does not work as well when you skip doses. Skipping doses also puts you at risk for  problems.  Do not smoke.   Monitor your blood pressure at home as directed by your health care provider. SEEK MEDICAL CARE IF:   You think you are having a reaction to medicines taken.  You have recurrent headaches or feel dizzy.  You have swelling in your ankles.  You have trouble with your vision. SEEK IMMEDIATE MEDICAL CARE IF:  You develop a severe headache or confusion.  You have unusual weakness, numbness, or feel faint.  You have severe chest or abdominal pain.  You vomit repeatedly.  You have trouble breathing. MAKE SURE YOU:   Understand these instructions.  Will watch your condition.  Will get help right away if you are not doing well or get worse.   This information is not intended to replace advice given to you by your health care provider. Make sure you discuss any questions you have with your health care provider.   Document Released: 12/27/2004 Document Revised: 05/13/2014 Document Reviewed: 10/19/2012 Elsevier Interactive Patient Education 2016 [employer] Inc[employer]  

## 2015-11-18 NOTE — Progress Notes (Signed)
Subjective:  Patient ID: Theresa Mejia, female    DOB: 03/02/1940  Age: 75 y.o. MRN: [id number]  CC: Hypertension   HPI Theresa Mejia presents for concerns about an elevated blood pressure. She tells me that one day prior to this visit, at home, her blood pressure was up to 199/100. She said she felt dizzy and lightheaded at the time. She also complains of a couple of episodes of elevated heart rate, anxiety, and feeling panicky. She denies chest pain, shortness of breath, lightheadedness, syncope, fatigue, edema, or chest pain. She is one week status post pacemaker placement for SA node dysfunction.  She also complains of anhedonia, sadness, anxiety, panicky feeling, and insomnia. She tells me that the current doses of Xanax and Prozac are just not cutting it. She wants to try a new antidepressant.  Outpatient Medications Prior to Visit  Medication Sig Dispense Refill  . ALPRAZolam (XANAX) 0.25 MG tablet Take 0.125 mg by mouth at bedtime as needed for anxiety or sleep.    . Cholecalciferol (VITAMIN D3) 2000 UNITS TABS Take 1 capsule by mouth daily.     . Cyanocobalamin (B-12) 5000 MCG CAPS Take 5,000 mcg by mouth daily.    . famotidine (PEPCID) 20 MG tablet Take 20 mg by mouth at bedtime.     . fluticasone (FLONASE) 50 MCG/ACT nasal spray Place 2 sprays into both nostrils daily. 16 g 3  . guaiFENesin (MUCINEX) 600 MG 12 hr tablet Take 600-1,200 mg by mouth 2 (two) times daily as needed for cough or to loosen phlegm. Reported on 06/04/2015    . levothyroxine (SYNTHROID, LEVOTHROID) 125 MCG tablet Take 62.5 mcg by mouth daily before breakfast.     . loratadine (CLARITIN) 10 MG tablet Take 10 mg by mouth daily as needed for allergies.    [name] vortioxetine HBr (TRINTELLIX) 10 MG TABS Take 1 tablet (10 mg total) by mouth daily. 30 tablet 5  . acetaminophen (TYLENOL) 500 MG tablet Take 1,000 mg by mouth every 6 (six) hours as needed.    . clonazePAM (KLONOPIN) 0.5 MG tablet Take 0.125-0.25 mg by  mouth daily as needed for anxiety.    . naproxen sodium (ANAPROX) 220 MG tablet Take 220 mg by mouth 2 (two) times daily as needed (pain).    . Polyvinyl Alcohol-Povidone (REFRESH OP) Apply 1 drop to eye 2 (two) times daily. May use additional drops as needed for dry eyes    . valsartan (DIOVAN) 160 MG tablet TAKE 1 TABLET BY MOUTH DAILY. (Patient taking differently: TAKE 1 TABLET BY MOUTH DAILY AT NIGHT) 90 tablet 3  . Wheat Dextrin (BENEFIBER PO) Take 2 scoop by mouth daily.     No facility-administered medications prior to visit.     ROS Review of Systems  Constitutional: Positive for fatigue. Negative for activity change, diaphoresis and unexpected weight change.  HENT: Negative.  Negative for trouble swallowing and voice change.   Eyes: Negative for photophobia and visual disturbance.  Respiratory: Negative.  Negative for cough, choking, chest tightness, shortness of breath and stridor.   Cardiovascular: Positive for palpitations. Negative for chest pain and leg swelling.  Gastrointestinal: Negative for abdominal pain, constipation, diarrhea, nausea and vomiting.  Endocrine: Negative.  Negative for cold intolerance and heat intolerance.  Genitourinary: Negative.  Negative for difficulty urinating.  Musculoskeletal: Negative.   Skin: Negative.   Neurological: Positive for dizziness. Negative for syncope, weakness, light-headedness, numbness and headaches.  Hematological: Negative.   Psychiatric/Behavioral: Positive for dysphoric mood  and sleep disturbance. Negative for confusion, decreased concentration, self-injury and suicidal ideas. The patient is nervous/anxious.     Objective:  BP (!) 144/92 (BP Location: Left Arm, Cuff Size: Normal)   Pulse 78   SpO2 97%   BP Readings from Last 3 Encounters:  11/18/15 (!) 144/92  11/18/15 (!) 144/92  11/12/15 (!) 155/90    Wt Readings from Last 3 Encounters:  11/12/15 152 lb 11.2 oz (69.3 kg)  10/19/15 154 lb 3.2 oz (69.9 kg)    09/03/15 148 lb (67.1 kg)    Physical Exam  Constitutional: She is oriented to person, place, and time. No distress.  HENT:  Mouth/Throat: Oropharynx is clear and moist. No oropharyngeal exudate.  Eyes: Conjunctivae are normal. Right eye exhibits no discharge. Left eye exhibits no discharge. No scleral icterus.  Neck: Normal range of motion. Neck supple. No JVD present. No tracheal deviation present. No thyromegaly present.  Cardiovascular: Normal rate, regular rhythm, normal heart sounds and intact distal pulses.  Exam reveals no gallop and no friction rub.   No murmur heard. EKG ----   Sinus  Rhythm  -Left axis -anterior fascicular block.   -Left atrial enlargement.   -Anteroseptal infarct -age undetermined  -Old inferior infarct.   ABNORMAL - no change from EKG of 1 week ago  Pulmonary/Chest: Effort normal and breath sounds normal. No stridor. No respiratory distress. She has no wheezes. She has no rales. She exhibits no tenderness.  Abdominal: Soft. Bowel sounds are normal. She exhibits no distension and no mass. There is no tenderness. There is no rebound and no guarding.  Musculoskeletal: Normal range of motion. She exhibits no edema, tenderness or deformity.  Lymphadenopathy:    She has no cervical adenopathy.  Neurological: She is oriented to person, place, and time.  Skin: Skin is warm and dry. No rash noted. She is not diaphoretic. No erythema. No pallor.  Psychiatric: She has a normal mood and affect. Her behavior is normal. Judgment and thought content normal.  Vitals reviewed.   Lab Results  Component Value Date   WBC 5.2 11/04/2015   HGB 13.4 11/04/2015   HCT 40.4 11/04/2015   PLT 296 11/04/2015   GLUCOSE 74 11/04/2015   CHOL 218 (H) 06/04/2015   TRIG 111.0 06/04/2015   HDL 42.10 06/04/2015   LDLDIRECT 148.3 09/01/2010   LDLCALC 153 (H) 06/04/2015   ALT 13 06/04/2015   AST 20 06/04/2015   NA 139 11/04/2015   K 4.0 11/04/2015   CL 102 11/04/2015    CREATININE 0.70 11/04/2015   BUN 16 11/04/2015   CO2 29 11/04/2015   TSH 1.80 11/18/2015   HGBA1C 5.5 10/15/2012    Dg Chest 2 View  Result Date: 11/12/2015 CLINICAL DATA:  75 year old hypertensive female with pacemaker in place. Initial encounter. EXAM: CHEST  2 VIEW COMPARISON:  06/27/2008. FINDINGS: Sequential pacemaker enters from the left with leads in the region of the right atrium and right ventricle. Heart size top-normal. No infiltrate, congestive heart failure or pneumothorax. No plain film evidence of pulmonary malignancy. Calcified tortuous aorta. No acute osseous abnormality. IMPRESSION: Pacemaker in place. No acute pulmonary abnormality. Tortuous aorta. Electronically Signed   By: Genia Del M.D.   On: 11/12/2015 07:43    Assessment & Plan:   Riko was seen today for hypertension.  Diagnoses and all orders for this visit:  Essential hypertension- her blood pressure is not well controlled so I will try more potent ARB. Her EKG today is negative  for LVH or signs of ischemia. -     EKG 12-Lead -     telmisartan (MICARDIS) 80 MG tablet; Take 1 tablet (80 mg total) by mouth daily.  Other specified hypothyroidism- her TFTs are in the normal range, she will remain on the current dose of levothyroxine. -     Thyroid Panel With TSH; Future  Palpitations- she has a normal heart rate on EKG today, I think her palpitations are related to panic and anxiety. I've changed her antidepressant from Prozac to Trintellix. -     EKG 12-Lead -     Thyroid Panel With TSH; Future  Fatigue, unspecified type- her examination today, EKG, and TFTs, and other recent labs indicate no secondary or metabolic causes for fatigue. I think this is related to the depression and anxiety and will upgrade her antidepressant therapy. -     EKG 12-Lead -     Thyroid Panel With TSH; Future   I have discontinued Ms. Kaminska valsartan, Wheat Dextrin (BENEFIBER PO), naproxen sodium, Polyvinyl Alcohol-Povidone  (REFRESH OP), clonazePAM, and acetaminophen. I am also having her start on telmisartan. Additionally, I am having her maintain her guaiFENesin, Vitamin D3, fluticasone, loratadine, levothyroxine, B-12, famotidine, ALPRAZolam, and vortioxetine HBr.  Meds ordered this encounter  Medications  . telmisartan (MICARDIS) 80 MG tablet    Sig: Take 1 tablet (80 mg total) by mouth daily.    Dispense:  90 tablet    Refill:  1     Follow-up: Return in about 6 weeks (around 12/30/2015).  [name], MD

## 2015-11-19 ENCOUNTER — Encounter: Payer: Self-pay | Admitting: Internal Medicine

## 2015-11-19 LAB — THYROID PANEL WITH TSH
Free Thyroxine Index: 3.1 (ref 1.4–3.8)
T3 Uptake: 35 % (ref 22–35)
T4 TOTAL: 8.8 ug/dL (ref 4.5–12.0)
TSH: 1.8 m[IU]/L

## 2015-11-20 ENCOUNTER — Telehealth: Payer: Self-pay

## 2015-11-20 NOTE — Telephone Encounter (Signed)
PA initiated via CoverMyMeds key [id number]

## 2015-11-23 NOTE — Telephone Encounter (Signed)
PA erroneous DENIED, corrected clinical information submitted via fax for expedited appeal

## 2015-11-24 NOTE — Telephone Encounter (Signed)
PA APPROVED 11/23/2015 - 01/09/2017

## 2015-11-25 ENCOUNTER — Ambulatory Visit (INDEPENDENT_AMBULATORY_CARE_PROVIDER_SITE_OTHER): Payer: PPO | Admitting: *Deleted

## 2015-11-25 VITALS — BP 156/96

## 2015-11-25 DIAGNOSIS — Z950 Presence of cardiac pacemaker: Secondary | ICD-10-CM | POA: Diagnosis not present

## 2015-11-25 DIAGNOSIS — R001 Bradycardia, unspecified: Secondary | ICD-10-CM | POA: Diagnosis not present

## 2015-11-25 LAB — CUP PACEART INCLINIC DEVICE CHECK
Date Time Interrogation Session: 20171115120526
Implantable Lead Implant Date: 20171101
Implantable Lead Location: 753859
Implantable Lead Location: 753860
Implantable Lead Model: 377
Implantable Lead Serial Number: 49553678
Implantable Lead Serial Number: 49617393
Implantable Pulse Generator Implant Date: 20171101
Lead Channel Impedance Value: 682 Ohm
Lead Channel Pacing Threshold Amplitude: 1 V
Lead Channel Pacing Threshold Pulse Width: 0.4 ms
Lead Channel Sensing Intrinsic Amplitude: 14.9 mV
Lead Channel Sensing Intrinsic Amplitude: 7.2 mV
Lead Channel Setting Pacing Amplitude: 3 V
MDC IDC LEAD IMPLANT DT: 20171101
MDC IDC MSMT LEADCHNL RA IMPEDANCE VALUE: 546 Ohm
MDC IDC MSMT LEADCHNL RV PACING THRESHOLD AMPLITUDE: 0.6 V
MDC IDC MSMT LEADCHNL RV PACING THRESHOLD PULSEWIDTH: 0.4 ms
MDC IDC SET LEADCHNL RA PACING AMPLITUDE: 3 V
MDC IDC SET LEADCHNL RV PACING PULSEWIDTH: 0.4 ms
MDC IDC STAT BRADY RA PERCENT PACED: 99 %
MDC IDC STAT BRADY RV PERCENT PACED: 0 %
Pulse Gen Model: 394969
Pulse Gen Serial Number: 68817609

## 2015-11-25 NOTE — Progress Notes (Signed)
Wound check appointment. Steri-strips removed. Wound without redness or edema. Incision edges approximated, wound well healed. Normal device function. Thresholds, sensing, and impedances consistent with implant measurements. Device programmed at 3.0V for extra safety margin until 3 month visit. Histogram distribution appropriate for patient and level of activity. No mode switches or high ventricular rates noted. Patient reports palpitations with activity. Reproduced activity in device room, noted onset of accelerated junctional rhythm (marking as PVCs), correlates with elevated "PVC" counters. Per GT, reprogrammed base rate to 70bpm. Patient educated about wound care, arm mobility, lifting restrictions. ROV with GT on 02/17/16.

## 2015-11-30 ENCOUNTER — Telehealth: Payer: Self-pay | Admitting: Emergency Medicine

## 2015-11-30 ENCOUNTER — Other Ambulatory Visit: Payer: Self-pay | Admitting: Internal Medicine

## 2015-11-30 DIAGNOSIS — F418 Other specified anxiety disorders: Secondary | ICD-10-CM

## 2015-11-30 MED ORDER — VILAZODONE HCL 10 & 20 MG PO KIT: 1.0000 | PACK | Freq: Every day | ORAL | 0 refills | Status: DC

## 2015-11-30 NOTE — Telephone Encounter (Signed)
Pt called and wants to know if there is a cheaper prescription other than vortioxetine HBr (TRINTELLIX) 10 MG TABS. She cant afford this medication. Please advise thanks.

## 2015-11-30 NOTE — Telephone Encounter (Signed)
Pt is requesting a change to a cheaper alternative.

## 2015-11-30 NOTE — Telephone Encounter (Signed)
no

## 2015-11-30 NOTE — Telephone Encounter (Signed)
Have you had an instance where the copay part for trintellix did not accept a pt due to being covered by medicare?

## 2015-11-30 NOTE — Telephone Encounter (Signed)
Ask her to come pick up a Viibryd starter kit

## 2015-12-01 NOTE — Telephone Encounter (Signed)
Pt informed of below.  

## 2015-12-02 NOTE — Telephone Encounter (Signed)
Pt has picked up the 14 days starter pack.

## 2015-12-05 ENCOUNTER — Other Ambulatory Visit: Payer: Self-pay | Admitting: Internal Medicine

## 2015-12-05 DIAGNOSIS — F418 Other specified anxiety disorders: Secondary | ICD-10-CM

## 2015-12-07 ENCOUNTER — Ambulatory Visit (INDEPENDENT_AMBULATORY_CARE_PROVIDER_SITE_OTHER): Payer: PPO | Admitting: Psychology

## 2015-12-07 ENCOUNTER — Telehealth: Payer: Self-pay | Admitting: Internal Medicine

## 2015-12-07 DIAGNOSIS — F4322 Adjustment disorder with anxiety: Secondary | ICD-10-CM

## 2015-12-07 MED ORDER — AMOXICILLIN 500 MG PO CAPS: ORAL_CAPSULE | ORAL | 0 refills | Status: DC

## 2015-12-07 NOTE — Telephone Encounter (Signed)
New message  Pt is having a dental implant tomorrow, 11/28  Pt is asking if she needs to be on an antibiotic prior to dental procedure  Please call back and advise

## 2015-12-07 NOTE — Telephone Encounter (Signed)
Called, spoke with pt. Pt is scheduled to have dental implant tomorrow (post will be placed). Informed Dr. Lovena Le recommends pt to have prophylaxis prior to dental treatment tomorrow (prophylaxis is recommended for 3 months from date of PPM implant). Informed pt to take Amoxicillin 2000 mg 1 hour prior to dental treatment. Rx sent in to [facility] on [address]. Pt verbalized understanding and agreed with plan.

## 2015-12-08 ENCOUNTER — Other Ambulatory Visit: Payer: Self-pay | Admitting: Internal Medicine

## 2015-12-08 DIAGNOSIS — F418 Other specified anxiety disorders: Secondary | ICD-10-CM

## 2015-12-08 MED ORDER — FLUOXETINE HCL 40 MG PO CAPS: 40.0000 mg | ORAL_CAPSULE | Freq: Two times a day (BID) | ORAL | 1 refills | Status: DC

## 2015-12-08 NOTE — Telephone Encounter (Signed)
Please advise 

## 2015-12-08 NOTE — Telephone Encounter (Signed)
Left detailed message on pt vm regarding the rx at [facility].

## 2015-12-08 NOTE — Telephone Encounter (Signed)
Please give patient a call. She is asking about going back on prozac with an increased dosage.   She states that the cost of viibryd isnt realistic for her. It falls on tier 4 on her formulary   She is using [facility] and she asks for a 3 month supply

## 2015-12-08 NOTE — Telephone Encounter (Signed)
Dose increased Rx sent to pharmacy

## 2015-12-11 ENCOUNTER — Other Ambulatory Visit: Payer: Self-pay | Admitting: Internal Medicine

## 2015-12-11 ENCOUNTER — Encounter: Payer: Self-pay | Admitting: Internal Medicine

## 2015-12-11 DIAGNOSIS — F418 Other specified anxiety disorders: Secondary | ICD-10-CM

## 2015-12-11 MED ORDER — FLUOXETINE HCL 10 MG PO CAPS: 10.0000 mg | ORAL_CAPSULE | Freq: Every day | ORAL | 1 refills | Status: DC

## 2015-12-11 MED ORDER — FLUOXETINE HCL 40 MG PO CAPS: 40.0000 mg | ORAL_CAPSULE | Freq: Every day | ORAL | 1 refills | Status: DC

## 2015-12-17 ENCOUNTER — Telehealth: Payer: Self-pay | Admitting: Internal Medicine

## 2015-12-17 NOTE — Telephone Encounter (Signed)
Called pt and let her know from a device standpoint it is ok to give blood, but advised pt to inform [facility] that she had a new pacemaker placed, that they may have some restrictions. Pt voiced understanding.

## 2015-12-17 NOTE — Telephone Encounter (Signed)
New message      Pt had a pacemaker put in nov 1st.  Can she give blood during the holiday blood drive?

## 2015-12-22 ENCOUNTER — Other Ambulatory Visit: Payer: Self-pay | Admitting: Internal Medicine

## 2015-12-22 ENCOUNTER — Encounter: Payer: Self-pay | Admitting: Internal Medicine

## 2015-12-24 ENCOUNTER — Other Ambulatory Visit: Payer: Self-pay | Admitting: Internal Medicine

## 2015-12-24 DIAGNOSIS — I10 Essential (primary) hypertension: Secondary | ICD-10-CM

## 2015-12-24 MED ORDER — CHLORTHALIDONE 25 MG PO TABS: 12.5000 mg | ORAL_TABLET | Freq: Every day | ORAL | 1 refills | Status: DC

## 2015-12-24 NOTE — Telephone Encounter (Signed)
Patient called in and she states that Dr. [name] changed her medication for bp back on Nov 8th. The medication was changed to telmisartan (MICARDIS) 80 MG tablet. She is still taking that medication to control her bp. She would like to know what she needs to do next, if it needs to be changed again. Please follow up with patient. Thank you.

## 2016-01-20 DIAGNOSIS — H903 Sensorineural hearing loss, bilateral: Secondary | ICD-10-CM | POA: Diagnosis not present

## 2016-01-26 ENCOUNTER — Telehealth: Payer: Self-pay | Admitting: Internal Medicine

## 2016-01-26 NOTE — Telephone Encounter (Signed)
Pt having dental cleaning Thursday and her dentist needs to know if she needs to be pre medicated prior to cleaning due to having a pacemaker -pls call pt [phone]

## 2016-01-26 NOTE — Telephone Encounter (Signed)
Pt wanted to know if she needed antibiotics prior having dental cleaning. Pt had a pacemaker implant on 11/11/15. Pt was made aware she  does not needs antibiotics prior dental cleaning But she needs to wait 3 months prior having any elective procedure per Dr. Tanna Furry nurse.. After 3 months she will be fine to have her peridental cleaning. Pt verbalized understanding.

## 2016-02-16 ENCOUNTER — Telehealth: Payer: Self-pay

## 2016-02-17 ENCOUNTER — Encounter: Payer: Self-pay | Admitting: Internal Medicine

## 2016-02-17 ENCOUNTER — Ambulatory Visit (INDEPENDENT_AMBULATORY_CARE_PROVIDER_SITE_OTHER): Payer: PPO | Admitting: Internal Medicine

## 2016-02-17 VITALS — BP 110/90 | HR 90 | Ht 60.0 in | Wt 150.2 lb

## 2016-02-17 DIAGNOSIS — Z950 Presence of cardiac pacemaker: Secondary | ICD-10-CM

## 2016-02-17 DIAGNOSIS — R002 Palpitations: Secondary | ICD-10-CM

## 2016-02-17 NOTE — Patient Instructions (Addendum)
Medication Instructions:  Your physician recommends that you continue on your current medications as directed. Please refer to the Current Medication list given to you today.   Labwork: None Ordered   Testing/Procedures: None Ordered   Follow-Up: Your physician wants you to follow-up in: 9 months with Dr. Lovena Le. You will receive a reminder letter in the mail two months in advance. If you don't receive a letter, please call our office to schedule the follow-up appointment.  Remote monitoring is used to monitor your Pacemaker from home. This monitoring reduces the number of office visits required to check your device to one time per year. It allows [name] to keep an eye on the functioning of your device to ensure it is working properly. You are scheduled for a device check from home on 05/18/16. You may send your transmission at any time that day. If you have a wireless device, the transmission will be sent automatically. After your physician reviews your transmission, you will receive a postcard with your next transmission date.    Any Other Special Instructions Will Be Listed Below (If Applicable).     If you need a refill on your cardiac medications before your next appointment, please call your pharmacy.

## 2016-02-17 NOTE — Progress Notes (Signed)
HPI Theresa Mejia is referred today by Dr. Marlou Porch for evaluation of palpitations and chronotropic incompetence. She is a pleasant 76 yo woman who c/o fatigue, weakness and palpitations. She has not had syncope. She has worn a 48 hour heart monitor demonstrating an average HR of 48/min and peak HR of 90 on one day and 72 on another. She has fatigue with exertion. She denies chest pain. No edema.  Allergies  Allergen Reactions  . Ramipril     REACTION: pt states INTOLERANT to Altace  . Sulfa Antibiotics     Says it dropped her blood pressure really low.   . Sulfonamide Derivatives     REACTION: hypotension     Current Outpatient Prescriptions  Medication Sig Dispense Refill  . ALPRAZolam (XANAX) 0.25 MG tablet Take 0.125 mg by mouth at bedtime as needed for anxiety or sleep.    [name] amoxicillin (AMOXIL) 500 MG capsule Take 2000 mg (4 caps) 1 hour prior to dental treatment 4 capsule 0  . chlorthalidone (HYGROTON) 25 MG tablet Take 0.5 tablets (12.5 mg total) by mouth daily. 45 tablet 1  . Cholecalciferol (VITAMIN D3) 2000 UNITS TABS Take 1 capsule by mouth daily.     . Cyanocobalamin (B-12) 5000 MCG CAPS Take 5,000 mcg by mouth daily.    . famotidine (PEPCID) 20 MG tablet Take 20 mg by mouth at bedtime.     [name] FLUoxetine (PROZAC) 10 MG capsule Take 1 capsule (10 mg total) by mouth daily. 90 capsule 1  . FLUoxetine (PROZAC) 40 MG capsule Take 1 capsule (40 mg total) by mouth daily. 90 capsule 1  . fluticasone (FLONASE) 50 MCG/ACT nasal spray Place 2 sprays into both nostrils daily. 16 g 3  . levothyroxine (SYNTHROID, LEVOTHROID) 125 MCG tablet Take 62.5 mcg by mouth daily before breakfast.     . loratadine (CLARITIN) 10 MG tablet Take 10 mg by mouth daily as needed for allergies.    [name] telmisartan (MICARDIS) 80 MG tablet Take 1 tablet (80 mg total) by mouth daily. 90 tablet 1   No current facility-administered medications for this visit.      Past Medical History:  Diagnosis Date  .  Allergic rhinitis   . Depression   . DJD (degenerative joint disease)   . GERD (gastroesophageal reflux disease)   . Headache(784.0)   . Hypercholesteremia   . Hypersomnia   . Hypertension   . Hypothyroidism   . Internal hemorrhoid 11/2008   s/p banding ([place])  . Lumbar disc disease   . OBSTRUCTIVE SLEEP APNEA    NPSG 04/29/07- AHI 1.1/hr, RDI 21.2/hr. Weight 176 lbs. CPAP was tried based on the RDI.    [name] OVERACTIVE BLADDER   . Presence of permanent cardiac pacemaker 11/11/2015  . Reactive depression (situational)   . Sinus node dysfunction (HCC)   . Spondylosis, cervical     ROS:   All systems reviewed and negative except as noted in the HPI.   Past Surgical History:  Procedure Laterality Date  . BIOPSY BREAST    . EP IMPLANTABLE DEVICE N/A 11/11/2015   Procedure: Pacemaker Implant;  Surgeon: Evans Lance, MD;  Location: [facility];  Service: Cardiovascular;  Laterality: N/A;  . eye lid lift    . INSERT / REPLACE / REMOVE PACEMAKER  11/11/2015  . KNEE ARTHROSCOPY     Dr. [name]  . rotator cuff surgery     Dr. [name]  . TUBAL LIGATION  Family History  Problem Relation Age of Onset  . Crohn's disease Father 62  . Macular degeneration Mother   . Cancer Mother 36    bladder  . Hypothyroidism Mother   . Tremor Mother   . Stroke Mother 81  . Parkinsonism Maternal Grandmother      Social History   Social History  . Marital status: Married    Spouse name: N/A  . Number of children: 2  . Years of education: N/A   Occupational History  . [place] [employer]    data entry   Social History Main Topics  . Smoking status: Former Smoker    Quit date: 01/11/1968  . Smokeless tobacco: Never Used  . Alcohol use No  . Drug use: No  . Sexual activity: Not on file   Other Topics Concern  . Not on file   Social History Narrative   married, lives with spouse -   Retired 05/2013     BP 110/90 (BP Location: Left Arm, Patient  Position: Sitting, Cuff Size: Normal)   Pulse 90   Ht 5' (1.524 m)   Wt 150 lb 3.2 oz (68.1 kg)   SpO2 96%   BMI 29.33 kg/m   Physical Exam:  Well appearing 76 yo woman, NAD HEENT: Unremarkable Neck:  No JVD, no thyromegally Lymphatics:  No adenopathy Back:  No CVA tenderness Lungs:  Clear with no wheezes. HEART:  Regular rate rhythm, no murmurs, no rubs, no clicks Abd:  soft, positive bowel sounds, no organomegally, no rebound, no guarding Ext:  2 plus pulses, no edema, no cyanosis, no clubbing Skin:  No rashes no nodules Neuro:  CN II through XII intact, motor grossly intact  48 hour holter - reviewed by me Stress test - reviewed by me  Assess/Plan: 1. Sinus node dysfunction - she has done well, s/p PPM insertion. She is encouraged to continue to exercise and remain active. 2. Palpitations - since her PPM was placed, these have improved. She has a couple of episodes of SVT lasting only a few seconds. She will undergo watchful waiting. No AA drug therapy for now. 3. HTN - her blood pressure is reasonably well controlled. Will follow. With exercise she will get a little lightheaded and I suspect her blood pressure is dropping. However, it always gets better.  4. PPM - her Biotronik DDD PM is working normally. Will recheck in several months.  Mikle Bosworth.D.

## 2016-03-11 NOTE — Telephone Encounter (Signed)
ERROR

## 2016-03-29 DIAGNOSIS — Z01419 Encounter for gynecological examination (general) (routine) without abnormal findings: Secondary | ICD-10-CM | POA: Diagnosis not present

## 2016-03-30 ENCOUNTER — Other Ambulatory Visit: Payer: Self-pay | Admitting: Obstetrics and Gynecology

## 2016-03-30 DIAGNOSIS — M8580 Other specified disorders of bone density and structure, unspecified site: Secondary | ICD-10-CM

## 2016-03-30 DIAGNOSIS — Z1231 Encounter for screening mammogram for malignant neoplasm of breast: Secondary | ICD-10-CM

## 2016-04-20 ENCOUNTER — Ambulatory Visit (INDEPENDENT_AMBULATORY_CARE_PROVIDER_SITE_OTHER): Payer: PPO | Admitting: Internal Medicine

## 2016-04-20 VITALS — BP 144/96 | HR 86 | Temp 98.0°F | Resp 16 | Wt 152.0 lb

## 2016-04-20 DIAGNOSIS — J301 Allergic rhinitis due to pollen: Secondary | ICD-10-CM

## 2016-04-20 DIAGNOSIS — J069 Acute upper respiratory infection, unspecified: Secondary | ICD-10-CM | POA: Insufficient documentation

## 2016-04-20 DIAGNOSIS — B9789 Other viral agents as the cause of diseases classified elsewhere: Secondary | ICD-10-CM

## 2016-04-20 DIAGNOSIS — F418 Other specified anxiety disorders: Secondary | ICD-10-CM

## 2016-04-20 MED ORDER — LEVOCETIRIZINE DIHYDROCHLORIDE 5 MG PO TABS: 5.0000 mg | ORAL_TABLET | Freq: Every evening | ORAL | 3 refills | Status: DC

## 2016-04-20 MED ORDER — METHYLPREDNISOLONE 4 MG PO TBPK: ORAL_TABLET | ORAL | 0 refills | Status: DC

## 2016-04-20 MED ORDER — ALPRAZOLAM 0.25 MG PO TABS: 0.1250 mg | ORAL_TABLET | Freq: Every evening | ORAL | 1 refills | Status: DC | PRN

## 2016-04-20 MED ORDER — HYDROCODONE-HOMATROPINE 5-1.5 MG/5ML PO SYRP: 5.0000 mL | ORAL_SOLUTION | Freq: Three times a day (TID) | ORAL | 0 refills | Status: DC | PRN

## 2016-04-20 NOTE — Patient Instructions (Signed)
Cough, Adult Coughing is a reflex that clears your throat and your airways. Coughing helps to heal and protect your lungs. It is normal to cough occasionally, but a cough that happens with other symptoms or lasts a long time may be a sign of a condition that needs treatment. A cough may last only 2-3 weeks (acute), or it may last longer than 8 weeks (chronic). What are the causes? Coughing is commonly caused by:  Breathing in substances that irritate your lungs.  A viral or bacterial respiratory infection.  Allergies.  Asthma.  Postnasal drip.  Smoking.  Acid backing up from the stomach into the esophagus (gastroesophageal reflux).  Certain medicines.  Chronic lung problems, including COPD (or rarely, lung cancer).  Other medical conditions such as heart failure.  Follow these instructions at home: Pay attention to any changes in your symptoms. Take these actions to help with your discomfort:  Take medicines only as told by your health care provider. ? If you were prescribed an antibiotic medicine, take it as told by your health care provider. Do not stop taking the antibiotic even if you start to feel better. ? Talk with your health care provider before you take a cough suppressant medicine.  Drink enough fluid to keep your urine clear or pale yellow.  If the air is dry, use a cold steam vaporizer or humidifier in your bedroom or your home to help loosen secretions.  Avoid anything that causes you to cough at work or at home.  If your cough is worse at night, try sleeping in a semi-upright position.  Avoid cigarette smoke. If you smoke, quit smoking. If you need help quitting, ask your health care provider.  Avoid caffeine.  Avoid alcohol.  Rest as needed.  Contact a health care provider if:  You have new symptoms.  You cough up pus.  Your cough does not get better after 2-3 weeks, or your cough gets worse.  You cannot control your cough with suppressant  medicines and you are losing sleep.  You develop pain that is getting worse or pain that is not controlled with pain medicines.  You have a fever.  You have unexplained weight loss.  You have night sweats. Get help right away if:  You cough up blood.  You have difficulty breathing.  Your heartbeat is very fast. This information is not intended to replace advice given to you by your health care provider. Make sure you discuss any questions you have with your health care provider. Document Released: 06/25/2010 Document Revised: 06/04/2015 Document Reviewed: 03/05/2014 Elsevier Interactive Patient Education  2017 Elsevier Inc.  

## 2016-04-20 NOTE — Progress Notes (Signed)
Subjective:  Patient ID: Theresa Mejia, female    DOB: 08-31-1940  Age: 76 y.o. MRN: [id number]  CC: Cough   HPI IMANNI BURDINE presents for a 5 day history of nonproductive cough, nasal congestion, earaches, scratchy throat, and sneezing.  Outpatient Medications Prior to Visit  Medication Sig Dispense Refill  . amoxicillin (AMOXIL) 500 MG capsule Take 2000 mg (4 caps) 1 hour prior to dental treatment 4 capsule 0  . chlorthalidone (HYGROTON) 25 MG tablet Take 0.5 tablets (12.5 mg total) by mouth daily. 45 tablet 1  . Cholecalciferol (VITAMIN D3) 2000 UNITS TABS Take 1 capsule by mouth daily.     . Cyanocobalamin (B-12) 5000 MCG CAPS Take 5,000 mcg by mouth daily.    . famotidine (PEPCID) 20 MG tablet Take 20 mg by mouth at bedtime.     [name] FLUoxetine (PROZAC) 10 MG capsule Take 1 capsule (10 mg total) by mouth daily. 90 capsule 1  . FLUoxetine (PROZAC) 40 MG capsule Take 1 capsule (40 mg total) by mouth daily. 90 capsule 1  . fluticasone (FLONASE) 50 MCG/ACT nasal spray Place 2 sprays into both nostrils daily. 16 g 3  . levothyroxine (SYNTHROID, LEVOTHROID) 125 MCG tablet Take 62.5 mcg by mouth daily before breakfast.     . telmisartan (MICARDIS) 80 MG tablet Take 1 tablet (80 mg total) by mouth daily. 90 tablet 1  . ALPRAZolam (XANAX) 0.25 MG tablet Take 0.125 mg by mouth at bedtime as needed for anxiety or sleep.    [name] loratadine (CLARITIN) 10 MG tablet Take 10 mg by mouth daily as needed for allergies.     No facility-administered medications prior to visit.     ROS Review of Systems  Constitutional: Negative.  Negative for activity change, chills, fatigue, fever and unexpected weight change.  HENT: Positive for congestion, postnasal drip, rhinorrhea and sneezing. Negative for facial swelling, sinus pain, sinus pressure, sore throat, tinnitus and trouble swallowing.   Eyes: Negative.   Respiratory: Positive for cough. Negative for chest tightness, shortness of breath and wheezing.     Cardiovascular: Negative.  Negative for chest pain, palpitations and leg swelling.  Gastrointestinal: Negative.  Negative for abdominal pain, constipation, diarrhea, nausea and vomiting.  Endocrine: Negative.   Genitourinary: Negative.   Musculoskeletal: Negative.  Negative for back pain and neck pain.  Skin: Negative.   Allergic/Immunologic: Negative.   Neurological: Negative.   Hematological: Negative.  Negative for adenopathy. Does not bruise/bleed easily.  Psychiatric/Behavioral: Negative for decreased concentration, dysphoric mood and sleep disturbance. The patient is nervous/anxious.     Objective:  BP (!) 144/96   Pulse 86   Temp 98 F (36.7 C) (Oral)   Resp 16   Wt 152 lb (68.9 kg)   SpO2 99%   BMI 29.69 kg/m   BP Readings from Last 3 Encounters:  04/23/16 (!) 144/96  02/17/16 110/90  11/25/15 (!) 156/96    Wt Readings from Last 3 Encounters:  04/23/16 152 lb (68.9 kg)  02/17/16 150 lb 3.2 oz (68.1 kg)  11/12/15 152 lb 11.2 oz (69.3 kg)    Physical Exam  Constitutional: She is oriented to person, place, and time.  HENT:  Right Ear: Hearing, external ear and ear canal normal.  Left Ear: Hearing, tympanic membrane, external ear and ear canal normal.  Nose: Mucosal edema and rhinorrhea present. No sinus tenderness or nasal septal hematoma. No epistaxis. Right sinus exhibits no maxillary sinus tenderness and no frontal sinus tenderness. Left sinus exhibits  no maxillary sinus tenderness and no frontal sinus tenderness.  Mouth/Throat: Oropharynx is clear and moist and mucous membranes are normal. Mucous membranes are not pale, not dry and not cyanotic. No trismus in the jaw. No uvula swelling. No oropharyngeal exudate, posterior oropharyngeal edema, posterior oropharyngeal erythema or tonsillar abscesses.  Eyes: Conjunctivae are normal. Right eye exhibits no discharge. Left eye exhibits no discharge. No scleral icterus.  Neck: Normal range of motion. Neck supple. No JVD  present. No tracheal deviation present. No thyromegaly present.  Cardiovascular: Normal rate, regular rhythm and intact distal pulses.  Exam reveals no gallop and no friction rub.   No murmur heard. Pulmonary/Chest: Effort normal and breath sounds normal. No respiratory distress. She has no wheezes. She has no rales. She exhibits no tenderness.  Abdominal: Soft. Bowel sounds are normal. She exhibits no distension and no mass. There is no tenderness. There is no rebound and no guarding.  Musculoskeletal: Normal range of motion. She exhibits no edema, tenderness or deformity.  Lymphadenopathy:    She has no cervical adenopathy.  Neurological: She is oriented to person, place, and time.  Skin: Skin is warm and dry. No rash noted. She is not diaphoretic. No erythema. No pallor.  Vitals reviewed.   Lab Results  Component Value Date   WBC 5.2 11/04/2015   HGB 13.4 11/04/2015   HCT 40.4 11/04/2015   PLT 296 11/04/2015   GLUCOSE 74 11/04/2015   CHOL 218 (H) 06/04/2015   TRIG 111.0 06/04/2015   HDL 42.10 06/04/2015   LDLDIRECT 148.3 09/01/2010   LDLCALC 153 (H) 06/04/2015   ALT 13 06/04/2015   AST 20 06/04/2015   NA 139 11/04/2015   K 4.0 11/04/2015   CL 102 11/04/2015   CREATININE 0.70 11/04/2015   BUN 16 11/04/2015   CO2 29 11/04/2015   TSH 1.80 11/18/2015   HGBA1C 5.5 10/15/2012    Dg Chest 2 View  Result Date: 11/12/2015 CLINICAL DATA:  76 year old hypertensive female with pacemaker in place. Initial encounter. EXAM: CHEST  2 VIEW COMPARISON:  06/27/2008. FINDINGS: Sequential pacemaker enters from the left with leads in the region of the right atrium and right ventricle. Heart size top-normal. No infiltrate, congestive heart failure or pneumothorax. No plain film evidence of pulmonary malignancy. Calcified tortuous aorta. No acute osseous abnormality. IMPRESSION: Pacemaker in place. No acute pulmonary abnormality. Tortuous aorta. Electronically Signed   By: Genia Del M.D.   On:  11/12/2015 07:43    Assessment & Plan:   Jagger was seen today for cough.  Diagnoses and all orders for this visit:  Acute nonseasonal allergic rhinitis due to pollen -     levocetirizine (XYZAL) 5 MG tablet; Take 1 tablet (5 mg total) by mouth every evening. -     methylPREDNISolone (MEDROL DOSEPAK) 4 MG TBPK tablet; TAKE AS DIRECTED  Depression with anxiety -     ALPRAZolam (XANAX) 0.25 MG tablet; Take 0.5 tablets (0.125 mg total) by mouth at bedtime as needed for anxiety or sleep.  Viral URI with cough -     HYDROcodone-homatropine (HYCODAN) 5-1.5 MG/5ML syrup; Take 5 mLs by mouth every 8 (eight) hours as needed for cough.   I have discontinued Ms. Winslett loratadine. I have also changed her ALPRAZolam. Additionally, I am having her start on levocetirizine, HYDROcodone-homatropine, and methylPREDNISolone. Lastly, I am having her maintain her Vitamin D3, fluticasone, levothyroxine, B-12, famotidine, telmisartan, amoxicillin, FLUoxetine, FLUoxetine, and chlorthalidone.  Meds ordered this encounter  Medications  . ALPRAZolam Duanne Moron)  0.25 MG tablet    Sig: Take 0.5 tablets (0.125 mg total) by mouth at bedtime as needed for anxiety or sleep.    Dispense:  30 tablet    Refill:  1  . levocetirizine (XYZAL) 5 MG tablet    Sig: Take 1 tablet (5 mg total) by mouth every evening.    Dispense:  90 tablet    Refill:  3  . HYDROcodone-homatropine (HYCODAN) 5-1.5 MG/5ML syrup    Sig: Take 5 mLs by mouth every 8 (eight) hours as needed for cough.    Dispense:  120 mL    Refill:  0  . methylPREDNISolone (MEDROL DOSEPAK) 4 MG TBPK tablet    Sig: TAKE AS DIRECTED    Dispense:  21 tablet    Refill:  0     Follow-up: Return in about 3 weeks (around 05/11/2016).  [name], MD

## 2016-04-21 ENCOUNTER — Encounter: Payer: Self-pay | Admitting: Internal Medicine

## 2016-04-21 ENCOUNTER — Ambulatory Visit
Admission: RE | Admit: 2016-04-21 | Discharge: 2016-04-21 | Disposition: A | Discharge status: Home / Self Care | Payer: PPO | Source: Ambulatory Visit | Attending: Obstetrics and Gynecology | Admitting: Obstetrics and Gynecology

## 2016-04-21 DIAGNOSIS — Z1231 Encounter for screening mammogram for malignant neoplasm of breast: Secondary | ICD-10-CM | POA: Diagnosis not present

## 2016-04-21 DIAGNOSIS — M8589 Other specified disorders of bone density and structure, multiple sites: Secondary | ICD-10-CM | POA: Diagnosis not present

## 2016-04-21 DIAGNOSIS — M8580 Other specified disorders of bone density and structure, unspecified site: Secondary | ICD-10-CM

## 2016-04-21 DIAGNOSIS — Z780 Asymptomatic menopausal state: Secondary | ICD-10-CM | POA: Diagnosis not present

## 2016-04-21 LAB — HM MAMMOGRAPHY

## 2016-04-21 LAB — HM DEXA SCAN: HM Dexa Scan: -1.8

## 2016-04-23 ENCOUNTER — Encounter: Payer: Self-pay | Admitting: Internal Medicine

## 2016-05-06 ENCOUNTER — Other Ambulatory Visit: Payer: Self-pay | Admitting: Internal Medicine

## 2016-05-06 DIAGNOSIS — I10 Essential (primary) hypertension: Secondary | ICD-10-CM

## 2016-05-18 ENCOUNTER — Ambulatory Visit (INDEPENDENT_AMBULATORY_CARE_PROVIDER_SITE_OTHER): Payer: PPO | Admitting: *Deleted

## 2016-05-18 DIAGNOSIS — R001 Bradycardia, unspecified: Secondary | ICD-10-CM

## 2016-05-18 NOTE — Progress Notes (Signed)
Remote pacemaker transmission.   

## 2016-05-20 ENCOUNTER — Encounter: Payer: Self-pay | Admitting: Cardiology

## 2016-05-20 LAB — CUP PACEART REMOTE DEVICE CHECK
Brady Statistic AP VP Percent: 0 %
Brady Statistic AP VS Percent: 100 %
Brady Statistic AS VP Percent: 0 %
Brady Statistic AS VS Percent: 0 %
Brady Statistic RA Percent Paced: 100 %
Brady Statistic RV Percent Paced: 0 %
Date Time Interrogation Session: 20180511060051
Implantable Lead Implant Date: 20171101
Implantable Lead Implant Date: 20171101
Implantable Lead Location: 753859
Implantable Lead Location: 753860
Implantable Lead Model: 377
Implantable Lead Model: 377
Implantable Lead Serial Number: 49553678
Implantable Lead Serial Number: 49617393
Implantable Pulse Generator Implant Date: 20171101
Lead Channel Impedance Value: 449 Ohm
Lead Channel Impedance Value: 585 Ohm
Lead Channel Pacing Threshold Amplitude: 0.7 V
Lead Channel Pacing Threshold Amplitude: 0.8 V
Lead Channel Pacing Threshold Pulse Width: 0.4 ms
Lead Channel Pacing Threshold Pulse Width: 0.4 ms
Lead Channel Setting Pacing Amplitude: 2 V
Lead Channel Setting Pacing Amplitude: 2.4 V
Lead Channel Setting Pacing Pulse Width: 0.4 ms
Pulse Gen Model: 394969
Pulse Gen Serial Number: 68817609

## 2016-06-02 DIAGNOSIS — L814 Other melanin hyperpigmentation: Secondary | ICD-10-CM | POA: Diagnosis not present

## 2016-06-02 DIAGNOSIS — D225 Melanocytic nevi of trunk: Secondary | ICD-10-CM | POA: Diagnosis not present

## 2016-06-02 DIAGNOSIS — L821 Other seborrheic keratosis: Secondary | ICD-10-CM | POA: Diagnosis not present

## 2016-06-02 DIAGNOSIS — D2262 Melanocytic nevi of left upper limb, including shoulder: Secondary | ICD-10-CM | POA: Diagnosis not present

## 2016-06-08 ENCOUNTER — Telehealth: Payer: Self-pay | Admitting: Internal Medicine

## 2016-06-08 NOTE — Telephone Encounter (Signed)
Patient is requesting to transfer from Dr. [name] to Dr. Janett Billow Copland.  Please advise.

## 2016-06-08 NOTE — Telephone Encounter (Signed)
Yes, that is okay with me 

## 2016-06-09 ENCOUNTER — Other Ambulatory Visit: Payer: Self-pay | Admitting: Internal Medicine

## 2016-06-09 DIAGNOSIS — I10 Essential (primary) hypertension: Secondary | ICD-10-CM

## 2016-06-09 NOTE — Telephone Encounter (Signed)
ok 

## 2016-06-09 NOTE — Telephone Encounter (Signed)
Tiffany, Could you please give patient a call to establish care with Dr. Lorelei Pont?  Thanks!

## 2016-06-09 NOTE — Telephone Encounter (Signed)
LVM advising patient to return call regarding scheduling new patient appointment, will try again.

## 2016-07-12 NOTE — Progress Notes (Signed)
[facility] [facility] [address] [place], [place] [address] [phone]  Date:  07/14/2016   Name:  Theresa Mejia   DOB:  November 01, 1940   MRN:  [id number]  PCP:  Darreld Mclean, MD    Chief Complaint: Transfer of Care (Pt here to est care. Would like to know if she should continue Xyzal. )   History of Present Illness:  Theresa Mejia is a 76 y.o. very pleasant female patient who presents with the following:  Here today as a new patient to my practice.   Pacemaker placed last fall She saw Dr. Lovena Le in February with the following A/P  Assess/Plan: 1. Sinus node dysfunction - she has done well, s/p PPM insertion. She is encouraged to continue to exercise and remain active. 2. Palpitations - since her PPM was placed, these have improved. She has a couple of episodes of SVT lasting only a few seconds. She will undergo watchful waiting. No AA drug therapy for now. 3. HTN - her blood pressure is reasonably well controlled. Will follow. With exercise she will get a little lightheaded and I suspect her blood pressure is dropping. However, it always gets better.  4. PPM - her Biotronik DDD PM is working normally. Will recheck in several months.  She lives in the [place] area.   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. Her son has 2 teenage children.    Her husband had an aortic dissection 4-5 years ago but he did survive. He was recently dx with prostate cancer but they are still involved in staging and figuring out treatment- he is a pt at [facility].   Her mother did pass away a couple of years ago- Leigh was her  caregiver  She was having HR in the 30s prior to her pacemaker.    She takes vitamin D and calcium to help protect her bones.   She does have hearing loss and got hearing aids not too long ago- they are somewhat helpful  BP Readings from Last 3 Encounters:  07/14/16 138/83  04/23/16 (!) 144/96  02/17/16 110/90   Pulse Readings from Last 3 Encounters:  07/14/16 78  04/23/16 86  02/17/16 90   Lab Results  Component Value Date   TSH 1.80 11/18/2015   Unfortunately her pacemaker did not help with her energy level- she hoped that it would She is on chlorthalidone and micardis  She is taking xyzal- she was not sure if she should continue to use this.   She is taking 40 mg of prozac; she is doing ok on this dose except she is worried about her husbands' illness.  Discussed a medication change but she prefers to continue her current prozac for now  She exercises daily She was dx with "borderline sleep apnea."  She tried CPAP for a year or so, but it did not help so she stopped using it She will wake up at night for no real reason that she can determine  She did try [place] but did not like it, does not want to go  on any other medications at this time   She did start on chlorthalidone earlier this year.  No labs since she started it.    She does use xanax prn for anxiety and needs a refill   NCCSR: last filled her xanax in 4/11; it does look like she may have a refill remaining at the pharmacy and she will check on this  She has a left 2nd hammer toe- this is starting to bother her some.  She would like the name of a podiatrist but is not yet ready to make an appt   She did have some hemorrhoidal banding but it did not really help  She sometimes feels like she does not empty her bowels totally and she will have to have a stool very frequently - up to 10x a day.  However she is not having any pain or other concerning sx   Lab Results  Component Value Date   TSH 1.80 11/18/2015     Patient Active Problem List   Diagnosis Date Noted  . Viral URI with cough 04/20/2016  . Sinus node dysfunction ([place]) 11/11/2015  . Routine general medical examination at a health care facility 06/04/2015  . Insomnia with sleep apnea 06/04/2015  . Bronchitis, chronic obstructive w acute bronchitis ([place]) 12/10/2014  . Lumbar disc disease 09/01/2010  . Obstructive sleep apnea 05/31/2007  . Hyperlipidemia with target LDL less than 130 02/05/2007  . Depression with anxiety 02/05/2007  . Allergic rhinitis 02/05/2007  . Hypothyroidism 02/02/2007  . Essential hypertension 02/02/2007    Past Medical History:  Diagnosis Date  . Allergic rhinitis   . Depression   . DJD (degenerative joint disease)   . GERD (gastroesophageal reflux disease)   . Headache(784.0)   . Hypercholesteremia   . Hypersomnia   . Hypertension   . Hypothyroidism   . Internal hemorrhoid 11/2008   s/p banding ([facility])  . Lumbar disc disease   . OBSTRUCTIVE SLEEP APNEA    NPSG 04/29/07- AHI 1.1/hr, RDI 21.2/hr. Weight 176 lbs. CPAP was tried based on the RDI.    [name] OVERACTIVE BLADDER   . Presence of permanent cardiac pacemaker 11/11/2015  . Reactive depression (situational)   . Sinus node dysfunction (HCC)   . Spondylosis, cervical     Past Surgical History:  Procedure Laterality Date  . BIOPSY BREAST    . EP IMPLANTABLE DEVICE N/A 11/11/2015   Procedure: Pacemaker Implant;  Surgeon: Evans Lance, MD;  Location: [facility];  Service: Cardiovascular;  Laterality: N/A;  . eye lid lift    . INSERT / REPLACE / REMOVE PACEMAKER  11/11/2015  . KNEE ARTHROSCOPY     Dr. [name]  . rotator cuff surgery     Dr. [name]  . TUBAL LIGATION      Social History  Substance Use Topics  . Smoking status: Former Smoker    Quit date: 01/11/1968  . Smokeless tobacco: Never Used  . Alcohol use No    Family History  Problem Relation Age of Onset  . Crohn's disease Father 41  . Macular degeneration Mother   .  Cancer Mother 87       bladder  . Hypothyroidism Mother   . Tremor Mother   . Stroke Mother 57  . Parkinsonism Maternal Grandmother     Allergies  Allergen Reactions  . Ramipril     REACTION: pt states INTOLERANT to Altace  . Sulfa Antibiotics     Says it dropped her  blood pressure really low.   . Sulfonamide Derivatives     REACTION: hypotension    Medication list has been reviewed and updated.  Current Outpatient Prescriptions on File Prior to Visit  Medication Sig Dispense Refill  . levocetirizine (XYZAL) 5 MG tablet Take 1 tablet (5 mg total) by mouth every evening. 90 tablet 3  . ALPRAZolam (XANAX) 0.25 MG tablet Take 0.5 tablets (0.125 mg total) by mouth at bedtime as needed for anxiety or sleep. 30 tablet 1  . amoxicillin (AMOXIL) 500 MG capsule Take 2000 mg (4 caps) 1 hour prior to dental treatment 4 capsule 0  . chlorthalidone (HYGROTON) 25 MG tablet TAKE 0.5 TABLETS (12.5 MG TOTAL) BY MOUTH DAILY. 45 tablet 0  . Cholecalciferol (VITAMIN D3) 2000 UNITS TABS Take 1 capsule by mouth daily.     . Cyanocobalamin (B-12) 5000 MCG CAPS Take 5,000 mcg by mouth daily.    . famotidine (PEPCID) 20 MG tablet Take 20 mg by mouth at bedtime.     [name] FLUoxetine (PROZAC) 40 MG capsule Take 1 capsule (40 mg total) by mouth daily. 90 capsule 1  . fluticasone (FLONASE) 50 MCG/ACT nasal spray Place 2 sprays into both nostrils daily. 16 g 3  . HYDROcodone-homatropine (HYCODAN) 5-1.5 MG/5ML syrup Take 5 mLs by mouth every 8 (eight) hours as needed for cough. 120 mL 0  . levothyroxine (SYNTHROID, LEVOTHROID) 125 MCG tablet Take 62.5 mcg by mouth daily before breakfast.     . methylPREDNISolone (MEDROL DOSEPAK) 4 MG TBPK tablet TAKE AS DIRECTED 21 tablet 0  . telmisartan (MICARDIS) 80 MG tablet TAKE 1 TABLET (80 MG TOTAL) BY MOUTH DAILY. 90 tablet 1   No current facility-administered medications on file prior to visit.     Review of Systems:  As per HPI- otherwise negative.   Physical  Examination: Vitals:   07/14/16 1414  BP: 138/83  Pulse: 78  Temp: 98.4 F (36.9 C)   Vitals:   07/14/16 1414  Weight: 153 lb 9.6 oz (69.7 kg)  Height: 5' (1.524 m)   Body mass index is 30 kg/m. Ideal Body Weight: Weight in (lb) to have BMI = 25: 127.7  GEN: WDWN, NAD, Non-toxic, A & O x 3, overweight, looks well HEENT: Atraumatic, Normocephalic. Neck supple. No masses, No LAD. Ears and Nose: No external deformity. CV: RRR, No M/G/R. No JVD. No thrill. No extra heart sounds. PULM: CTA B, no wheezes, crackles, rhonchi. No retractions. No resp. distress. No accessory muscle use. ABD: S, NT, ND, +BS. No rebound. No HSM. EXTR: No c/c/e NEURO Normal gait.  PSYCH: Normally interactive. Conversant. Not depressed or anxious appearing.  Calm demeanor.    Assessment and Plan: Medication monitoring encounter - Plan: Comprehensive metabolic panel  Acute nonseasonal allergic rhinitis due to pollen - Plan: levocetirizine (XYZAL) 5 MG tablet  Hypothyroidism (acquired) - Plan: TSH  Essential hypertension - Plan: Comprehensive metabolic panel  Depression with anxiety - Plan: ALPRAZolam (XANAX) 0.25 MG tablet  Here today to establish care She will try not taking her xyzal for a few days- if she missed it she can restart She would like info for a podiatrist- provided this for her today but she does not want a referral yet Labs pending today Refilled xanax for prn use for anxiety and insomnia Asked her to schedule a MWE for the fall  Results for orders placed or performed in visit on 07/14/16  TSH  Result Value Ref Range   TSH 1.08 0.35 -  4.50 uIU/mL  Comprehensive metabolic panel  Result Value Ref Range   Sodium 133 (L) 135 - 145 mEq/L   Potassium 3.4 (L) 3.5 - 5.1 mEq/L   Chloride 97 96 - 112 mEq/L   CO2 29 19 - 32 mEq/L   Glucose, Bld 93 70 - 99 mg/dL   BUN 24 (H) 6 - 23 mg/dL   Creatinine, Ser 0.77 0.40 - 1.20 mg/dL   Total Bilirubin 0.5 0.2 - 1.2 mg/dL   Alkaline  Phosphatase 43 39 - 117 U/L   AST 23 0 - 37 U/L   ALT 21 0 - 35 U/L   Total Protein 7.5 6.0 - 8.3 g/dL   Albumin 4.4 3.5 - 5.2 g/dL   Calcium 9.2 8.4 - 10.5 mg/dL   GFR 77.98 >60.00 mL/min     Signed Lamar Blinks, MD  She does use mychart

## 2016-07-14 ENCOUNTER — Ambulatory Visit (INDEPENDENT_AMBULATORY_CARE_PROVIDER_SITE_OTHER): Payer: PPO | Admitting: Family Medicine

## 2016-07-14 ENCOUNTER — Encounter: Payer: Self-pay | Admitting: Family Medicine

## 2016-07-14 VITALS — BP 138/83 | HR 78 | Temp 98.4°F | Ht 60.0 in | Wt 153.6 lb

## 2016-07-14 DIAGNOSIS — E039 Hypothyroidism, unspecified: Secondary | ICD-10-CM | POA: Diagnosis not present

## 2016-07-14 DIAGNOSIS — J301 Allergic rhinitis due to pollen: Secondary | ICD-10-CM

## 2016-07-14 DIAGNOSIS — I10 Essential (primary) hypertension: Secondary | ICD-10-CM | POA: Diagnosis not present

## 2016-07-14 DIAGNOSIS — Z5181 Encounter for therapeutic drug level monitoring: Secondary | ICD-10-CM

## 2016-07-14 DIAGNOSIS — F418 Other specified anxiety disorders: Secondary | ICD-10-CM | POA: Diagnosis not present

## 2016-07-14 LAB — COMPREHENSIVE METABOLIC PANEL
ALBUMIN: 4.4 g/dL (ref 3.5–5.2)
ALT: 21 U/L (ref 0–35)
AST: 23 U/L (ref 0–37)
Alkaline Phosphatase: 43 U/L (ref 39–117)
BUN: 24 mg/dL — ABNORMAL HIGH (ref 6–23)
CALCIUM: 9.2 mg/dL (ref 8.4–10.5)
CHLORIDE: 97 meq/L (ref 96–112)
CO2: 29 mEq/L (ref 19–32)
Creatinine, Ser: 0.77 mg/dL (ref 0.40–1.20)
GFR: 77.98 mL/min (ref >60.00)
Glucose, Bld: 93 mg/dL (ref 70–99)
POTASSIUM: 3.4 meq/L — AB (ref 3.5–5.1)
Sodium: 133 mEq/L — ABNORMAL LOW (ref 135–145)
Total Bilirubin: 0.5 mg/dL (ref 0.2–1.2)
Total Protein: 7.5 g/dL (ref 6.0–8.3)

## 2016-07-14 LAB — TSH: TSH: 1.08 u[IU]/mL (ref 0.35–4.50)

## 2016-07-14 MED ORDER — LEVOCETIRIZINE DIHYDROCHLORIDE 5 MG PO TABS: 5.0000 mg | ORAL_TABLET | Freq: Every evening | ORAL | 3 refills | Status: DC

## 2016-07-14 MED ORDER — ALPRAZOLAM 0.25 MG PO TABS: 0.1250 mg | ORAL_TABLET | Freq: Every evening | ORAL | 1 refills | Status: DC | PRN

## 2016-07-14 NOTE — Patient Instructions (Addendum)
[facility], [place], [place] [address] Phone: [phone]  We will check labs for you today and be in touch with your results  I also refilled your xanax for you.  Continue to use as needed  Please schedule a medicare wellness exam for this fall- if you will let me know when you are coming in I will order the rest of your labs (to be done fasting) for that visit

## 2016-08-04 DIAGNOSIS — Z961 Presence of intraocular lens: Secondary | ICD-10-CM | POA: Diagnosis not present

## 2016-08-04 DIAGNOSIS — H524 Presbyopia: Secondary | ICD-10-CM | POA: Diagnosis not present

## 2016-08-04 DIAGNOSIS — H2512 Age-related nuclear cataract, left eye: Secondary | ICD-10-CM | POA: Diagnosis not present

## 2016-08-17 ENCOUNTER — Ambulatory Visit (INDEPENDENT_AMBULATORY_CARE_PROVIDER_SITE_OTHER): Payer: PPO | Admitting: *Deleted

## 2016-08-17 DIAGNOSIS — R001 Bradycardia, unspecified: Secondary | ICD-10-CM | POA: Diagnosis not present

## 2016-08-17 NOTE — Progress Notes (Signed)
Remote pacemaker transmission.   

## 2016-08-18 ENCOUNTER — Encounter: Payer: Self-pay | Admitting: Cardiology

## 2016-08-23 LAB — CUP PACEART REMOTE DEVICE CHECK
Brady Statistic RA Percent Paced: 99 %
Brady Statistic RV Percent Paced: 0 %
Implantable Lead Implant Date: 20171101
Implantable Lead Location: 753860
Implantable Lead Model: 377
Implantable Lead Serial Number: 49553678
Lead Channel Impedance Value: 449 Ohm
Lead Channel Pacing Threshold Amplitude: 0.6 V
Lead Channel Sensing Intrinsic Amplitude: 12 mV
Lead Channel Sensing Intrinsic Amplitude: 3.6 mV
MDC IDC LEAD IMPLANT DT: 20171101
MDC IDC LEAD LOCATION: 753859
MDC IDC LEAD SERIAL: 49617393
MDC IDC MSMT LEADCHNL RA PACING THRESHOLD PULSEWIDTH: 0.4 ms
MDC IDC MSMT LEADCHNL RV IMPEDANCE VALUE: 702 Ohm
MDC IDC MSMT LEADCHNL RV PACING THRESHOLD AMPLITUDE: 1 V
MDC IDC MSMT LEADCHNL RV PACING THRESHOLD PULSEWIDTH: 0.4 ms
MDC IDC PG IMPLANT DT: 20171101
MDC IDC SESS DTM: 20180814152658
Pulse Gen Serial Number: 68817609

## 2016-09-05 ENCOUNTER — Other Ambulatory Visit: Payer: Self-pay | Admitting: Internal Medicine

## 2016-09-05 DIAGNOSIS — I10 Essential (primary) hypertension: Secondary | ICD-10-CM

## 2016-09-07 ENCOUNTER — Encounter: Payer: Self-pay | Admitting: Family Medicine

## 2016-09-07 ENCOUNTER — Other Ambulatory Visit: Payer: Self-pay | Admitting: Internal Medicine

## 2016-09-07 DIAGNOSIS — I10 Essential (primary) hypertension: Secondary | ICD-10-CM

## 2016-09-07 MED ORDER — CHLORTHALIDONE 25 MG PO TABS
12.5000 mg | ORAL_TABLET | Freq: Every day | ORAL | 1 refills | Status: DC
Start: 2016-09-07 — End: 2016-09-07

## 2016-09-07 MED ORDER — CHLORTHALIDONE 25 MG PO TABS: 12.5000 mg | ORAL_TABLET | Freq: Every day | ORAL | 3 refills | Status: DC

## 2016-09-09 ENCOUNTER — Other Ambulatory Visit: Payer: Self-pay

## 2016-09-09 DIAGNOSIS — I10 Essential (primary) hypertension: Secondary | ICD-10-CM

## 2016-09-09 MED ORDER — CHLORTHALIDONE 25 MG PO TABS
12.5000 mg | ORAL_TABLET | Freq: Every day | ORAL | 3 refills | Status: DC
Start: 2016-09-09 — End: 2016-12-06

## 2016-09-09 NOTE — Telephone Encounter (Signed)
Refaxed Chlorthalidone as pharm not rec/thx dmf

## 2016-10-17 ENCOUNTER — Telehealth: Payer: Self-pay | Admitting: Family Medicine

## 2016-10-17 ENCOUNTER — Encounter: Payer: Self-pay | Admitting: Family Medicine

## 2016-10-17 DIAGNOSIS — E559 Vitamin D deficiency, unspecified: Secondary | ICD-10-CM

## 2016-10-17 DIAGNOSIS — E871 Hypo-osmolality and hyponatremia: Secondary | ICD-10-CM

## 2016-10-17 DIAGNOSIS — Z13220 Encounter for screening for lipoid disorders: Secondary | ICD-10-CM

## 2016-10-17 DIAGNOSIS — Z5181 Encounter for therapeutic drug level monitoring: Secondary | ICD-10-CM

## 2016-10-17 NOTE — Telephone Encounter (Signed)
Pt scheduled Medicare Wellness with Glenard Haring 11/03/16 pt states Copland told her to schedule this fall and let her know so she can put order for labs. Pt states she will come fasting 10/25 date with angel.

## 2016-10-18 ENCOUNTER — Encounter: Payer: Self-pay | Admitting: Family Medicine

## 2016-10-18 DIAGNOSIS — Z130 Encounter for screening for diseases of the blood and blood-forming organs and certain disorders involving the immune mechanism: Secondary | ICD-10-CM

## 2016-10-18 DIAGNOSIS — Z13220 Encounter for screening for lipoid disorders: Secondary | ICD-10-CM

## 2016-10-18 DIAGNOSIS — Z131 Encounter for screening for diabetes mellitus: Secondary | ICD-10-CM

## 2016-10-19 NOTE — Telephone Encounter (Signed)
Looks like orders were placed on 10/28/16 by PCP.

## 2016-10-26 NOTE — Progress Notes (Addendum)
Subjective:   Theresa Mejia is a 76 y.o. female who presents for Medicare Annual (Subsequent) preventive examination.  Review of Systems:  No ROS.  Medicare Wellness Visit. Additional risk factors are reflected in the social history.  Cardiac Risk Factors include: advanced age (>64men, >18 women);dyslipidemia;hypertension Sleep patterns: never slept well. Wakes once to urinate. No trouble falling back sleep. Stays in bed about 10 hrs. Takes xanax for sleep.   Female:   Pap- pt states 1 yr ago - normal.      Mammo- last 04/21/16 -normal     Dexa scan- last 04/21/16- osteopenia       CCS- last reported 02/05/15      Objective:     Vitals: BP 115/77 (BP Location: Left Arm, Patient Position: Sitting, Cuff Size: Normal)   Pulse 70   Ht 5' (1.524 m)   Wt 153 lb 9.6 oz (69.7 kg)   SpO2 97%   BMI 30.00 kg/m   Body mass index is 30 kg/m.   Tobacco History  Smoking Status  . Former Smoker  . Quit date: 01/11/1968  Smokeless Tobacco  . Never Used     Counseling given: Not Answered   Past Medical History:  Diagnosis Date  . Allergic rhinitis   . Depression   . DJD (degenerative joint disease)   . GERD (gastroesophageal reflux disease)   . Headache(784.0)   . Hypercholesteremia   . Hypersomnia   . Hypertension   . Hypothyroidism   . Internal hemorrhoid 11/2008   s/p banding ([place])  . Lumbar disc disease   . OBSTRUCTIVE SLEEP APNEA    NPSG 04/29/07- AHI 1.1/hr, RDI 21.2/hr. Weight 176 lbs. CPAP was tried based on the RDI.    [name] OVERACTIVE BLADDER   . Presence of permanent cardiac pacemaker 11/11/2015  . Reactive depression (situational)   . Sinus node dysfunction (HCC)   . Spondylosis, cervical    Past Surgical History:  Procedure Laterality Date  . BIOPSY BREAST    . EP IMPLANTABLE DEVICE N/A 11/11/2015   Procedure: Pacemaker Implant;  Surgeon: Evans Lance, MD;  Location: [facility];  Service: Cardiovascular;  Laterality: N/A;  . eye lid lift    .  INSERT / REPLACE / REMOVE PACEMAKER  11/11/2015  . KNEE ARTHROSCOPY     Dr. [name]  . rotator cuff surgery     Dr. [name]  . TUBAL LIGATION     Family History  Problem Relation Age of Onset  . Crohn's disease Father 11  . Macular degeneration Mother   . Cancer Mother 52       bladder  . Hypothyroidism Mother   . Tremor Mother   . Stroke Mother 53  . Parkinsonism Maternal Grandmother    History  Sexual Activity  . Sexual activity: No    Outpatient Encounter Prescriptions as of 11/03/2016  Medication Sig  . ALPRAZolam (XANAX) 0.25 MG tablet Take 0.5 tablets (0.125 mg total) by mouth at bedtime as needed for anxiety or sleep.  . chlorthalidone (HYGROTON) 25 MG tablet Take 0.5 tablets (12.5 mg total) by mouth daily.  . Cholecalciferol (VITAMIN D3) 2000 UNITS TABS Take 1 capsule by mouth daily.   . Cyanocobalamin (B-12) 5000 MCG CAPS Take 5,000 mcg by mouth daily.  . famotidine (PEPCID) 20 MG tablet Take 20 mg by mouth at bedtime.   [name] FLUoxetine (PROZAC) 40 MG capsule Take 1 capsule (40 mg total) by mouth daily.  . fluticasone (FLONASE) 50  MCG/ACT nasal spray Place 2 sprays into both nostrils daily.  [name] HYDROcodone-homatropine (HYCODAN) 5-1.5 MG/5ML syrup Take 5 mLs by mouth every 8 (eight) hours as needed for cough.  . levocetirizine (XYZAL) 5 MG tablet Take 1 tablet (5 mg total) by mouth every evening.  [name] levothyroxine (SYNTHROID, LEVOTHROID) 125 MCG tablet Take 62.5 mcg by mouth daily before breakfast.   . telmisartan (MICARDIS) 80 MG tablet TAKE 1 TABLET (80 MG TOTAL) BY MOUTH DAILY.  [name] amoxicillin (AMOXIL) 500 MG capsule Take 2000 mg (4 caps) 1 hour prior to dental treatment (Patient not taking: Reported on 11/03/2016)   No facility-administered encounter medications on file as of 11/03/2016.     Activities of Daily Living In your present state of health, do you have any difficulty performing the following activities: 11/03/2016 11/03/2016  Hearing? N N  Comment - wears  hearing aids in both ears  Vision? N -  Comment wears glasses for reading. eye doctor yearly. -  Difficulty concentrating or making decisions? Y -  Comment States she just feels concerned about forgetting little things. -  Walking or climbing stairs? N -  Comment - -  Dressing or bathing? N -  Doing errands, shopping? N -  Preparing Food and eating ? N -  Using the Toilet? N -  In the past six months, have you accidently leaked urine? N -  Do you have problems with loss of bowel control? N -  Comment Follows with Dr.Medoff. Hemorrhoids. -  Managing your Medications? N -  Managing your Finances? N -  Housekeeping or managing your Housekeeping? N -  Some recent data might be hidden    Patient Care Team: Copland, Gay Filler, MD as PCP - General (Family Medicine) Deneise Lever, MD (Pulmonary Disease) Gaynelle Arabian, MD (Orthopedic Surgery) Richmond Campbell, MD (Gastroenterology)    Assessment:    Physical assessment deferred to PCP.  Exercise Activities and Dietary recommendations Current Exercise Habits: Structured exercise class, Time (Minutes): 45, Frequency (Times/Week): 4, Weekly Exercise (Minutes/Week): 180, Intensity: Moderate Diet (meal preparation, eat out, water intake, caffeinated beverages, dairy products, fruits and vegetables): well balanced    Goals    . Weight (lb) < 143 lb (64.9 kg)          Lose 10lbs. Goal 143lb      Fall Risk Fall Risk  11/03/2016 06/08/2015 06/08/2015 06/04/2015 11/06/2014  Falls in the past year? No No No No No   Depression Screen PHQ 2/9 Scores 11/03/2016 06/08/2015 06/04/2015 11/06/2014  PHQ - 2 Score 4 1 2  0  PHQ- 9 Score 19 7 6  -     Cognitive Function MMSE - Mini Mental State Exam 11/03/2016  Orientation to time 5  Orientation to Place 5  Registration 3  Attention/ Calculation 5  Recall 3  Language- name 2 objects 2  Language- repeat 1  Language- follow 3 step command 3  Language- read & follow direction 1  Write a  sentence 1  Copy design 1  Total score 30        Immunization History  Administered Date(s) Administered  . Influenza Split 10/11/2010, 10/04/2013  . Influenza Whole 10/13/2009  . Influenza-Unspecified 10/28/2015  . Pneumococcal Conjugate-13 11/06/2014  . Pneumococcal Polysaccharide-23 02/25/2008  . Td 02/25/2008   Screening Tests Health Maintenance  Topic Date Due  . INFLUENZA VACCINE  08/10/2016  . TETANUS/TDAP  02/24/2018  . MAMMOGRAM  04/22/2018  . COLONOSCOPY  02/04/2025  . DEXA SCAN  Completed  .  PNA vac Low Risk Adult  Completed      Plan:   Follow up with PCP as directed.  Continue to eat heart healthy diet (full of fruits, vegetables, whole grains, lean protein, water--limit salt, fat, and sugar intake) and increase physical activity as tolerated.  Continue doing brain stimulating activities (puzzles, reading, adult coloring books, staying active) to keep memory sharp.     I have personally reviewed and noted the following in the patient's chart:   . Medical and social history . Use of alcohol, tobacco or illicit drugs  . Current medications and supplements . Functional ability and status . Nutritional status . Physical activity . Advanced directives . List of other physicians . Hospitalizations, surgeries, and ER visits in previous 12 months . Vitals . Screenings to include cognitive, depression, and falls . Referrals and appointments  In addition, I have reviewed and discussed with patient certain preventive protocols, quality metrics, and best practice recommendations. A written personalized care plan for preventive services as well as general preventive health recommendations were provided to patient.     Shela Nevin, RN  11/03/2016  I have reviewed the above note by Ms. Vevelyn Royals and agree with her documentationDenny Peon MD

## 2016-10-26 NOTE — Telephone Encounter (Signed)
-----   Message from Dennis Bast, RN sent at 10/26/2016  2:25 PM EDT ----- Regarding: Fasting labs Pt is coming in for AWV next Thursday.  In her last OV plan, you stated.... "Please schedule a medicare wellness exam for this fall- if you will let me know when you are coming in I will order the rest of your labs (to be done fasting) for that visit "

## 2016-11-03 ENCOUNTER — Encounter: Payer: Self-pay | Admitting: *Deleted

## 2016-11-03 ENCOUNTER — Ambulatory Visit (INDEPENDENT_AMBULATORY_CARE_PROVIDER_SITE_OTHER): Payer: PPO | Admitting: *Deleted

## 2016-11-03 VITALS — BP 115/77 | HR 70 | Ht 60.0 in | Wt 153.6 lb

## 2016-11-03 DIAGNOSIS — E559 Vitamin D deficiency, unspecified: Secondary | ICD-10-CM | POA: Diagnosis not present

## 2016-11-03 DIAGNOSIS — Z0000 Encounter for general adult medical examination without abnormal findings: Secondary | ICD-10-CM

## 2016-11-03 DIAGNOSIS — Z5181 Encounter for therapeutic drug level monitoring: Secondary | ICD-10-CM | POA: Diagnosis not present

## 2016-11-03 DIAGNOSIS — Z131 Encounter for screening for diabetes mellitus: Secondary | ICD-10-CM | POA: Diagnosis not present

## 2016-11-03 DIAGNOSIS — Z13220 Encounter for screening for lipoid disorders: Secondary | ICD-10-CM

## 2016-11-03 LAB — LIPID PANEL
CHOL/HDL RATIO: 5
Cholesterol: 194 mg/dL (ref 0–200)
HDL: 35.7 mg/dL — ABNORMAL LOW (ref >39.00)
LDL CALC: 132 mg/dL — AB (ref 0–99)
NonHDL: 157.97
Triglycerides: 129 mg/dL (ref 0.0–149.0)
VLDL: 25.8 mg/dL (ref 0.0–40.0)

## 2016-11-03 LAB — COMPREHENSIVE METABOLIC PANEL
ALT: 18 U/L (ref 0–35)
AST: 23 U/L (ref 0–37)
Albumin: 4.3 g/dL (ref 3.5–5.2)
Alkaline Phosphatase: 43 U/L (ref 39–117)
BUN: 18 mg/dL (ref 6–23)
CALCIUM: 9.6 mg/dL (ref 8.4–10.5)
CHLORIDE: 97 meq/L (ref 96–112)
CO2: 33 meq/L — AB (ref 19–32)
CREATININE: 0.76 mg/dL (ref 0.40–1.20)
GFR: 79.1 mL/min (ref >60.00)
Glucose, Bld: 83 mg/dL (ref 70–99)
POTASSIUM: 3.6 meq/L (ref 3.5–5.1)
SODIUM: 135 meq/L (ref 135–145)
Total Bilirubin: 0.7 mg/dL (ref 0.2–1.2)
Total Protein: 7.2 g/dL (ref 6.0–8.3)

## 2016-11-03 LAB — CBC
HEMATOCRIT: 38 % (ref 36.0–46.0)
Hemoglobin: 12.6 g/dL (ref 12.0–15.0)
MCHC: 33.2 g/dL (ref 30.0–36.0)
MCV: 88.8 fl (ref 78.0–100.0)
PLATELETS: 267 10*3/uL (ref 150.0–400.0)
RBC: 4.28 Mil/uL (ref 3.87–5.11)
RDW: 13.2 % (ref 11.5–15.5)
WBC: 4.5 10*3/uL (ref 4.0–10.5)

## 2016-11-03 LAB — VITAMIN D 25 HYDROXY (VIT D DEFICIENCY, FRACTURES): VITD: 69.71 ng/mL (ref 30.00–100.00)

## 2016-11-03 NOTE — Addendum Note (Signed)
Addended by: Caffie Pinto on: 11/03/2016 11:03 AM   Modules accepted: Orders

## 2016-11-03 NOTE — Addendum Note (Signed)
Addended by: Caffie Pinto on: 11/03/2016 11:04 AM   Modules accepted: Orders

## 2016-11-03 NOTE — Patient Instructions (Addendum)
[name] , Thank you for taking time to come for your Medicare Wellness Visit. I appreciate your ongoing commitment to your health goals. Please review the following plan we discussed and let me know if I can assist you in the future.   These are the goals we discussed: Goals    . Weight (lb) < 143 lb (64.9 kg)          Lose 10lbs. Goal 143lb       This is a list of the screening recommended for you and due dates:  Health Maintenance  Topic Date Due  . Tetanus Vaccine  02/24/2018  . Mammogram  04/22/2018  . Colon Cancer Screening  02/04/2025  . Flu Shot  Completed  . DEXA scan (bone density measurement)  Completed  . Pneumonia vaccines  Completed   Continue to eat heart healthy diet (full of fruits, vegetables, whole grains, lean protein, water--limit salt, fat, and sugar intake) and increase physical activity as tolerated.  Continue doing brain stimulating activities (puzzles, reading, adult coloring books, staying active) to keep memory sharp.   Go to the lab   Health Maintenance for Postmenopausal Women Menopause is a normal process in which your reproductive ability comes to an end. This process happens gradually over a span of months to years, usually between the ages of 17 and 75. Menopause is complete when you have missed 12 consecutive menstrual periods. It is important to talk with your health care provider about some of the most common conditions that affect postmenopausal women, such as heart disease, cancer, and bone loss (osteoporosis). Adopting a healthy lifestyle and getting preventive care can help to promote your health and wellness. Those actions can also lower your chances of developing some of these common conditions. What should I know about menopause? During menopause, you may experience a number of symptoms, such as:  Moderate-to-severe hot flashes.  Night sweats.  Decrease in sex drive.  Mood  swings.  Headaches.  Tiredness.  Irritability.  Memory problems.  Insomnia.  Choosing to treat or not to treat menopausal changes is an individual decision that you make with your health care provider. What should I know about hormone replacement therapy and supplements? Hormone therapy products are effective for treating symptoms that are associated with menopause, such as hot flashes and night sweats. Hormone replacement carries certain risks, especially as you become older. If you are thinking about using estrogen or estrogen with progestin treatments, discuss the benefits and risks with your health care provider. What should I know about heart disease and stroke? Heart disease, heart attack, and stroke become more likely as you age. This may be due, in part, to the hormonal changes that your body experiences during menopause. These can affect how your body processes dietary fats, triglycerides, and cholesterol. Heart attack and stroke are both medical emergencies. There are many things that you can do to help prevent heart disease and stroke:  Have your blood pressure checked at least every 1-2 years. High blood pressure causes heart disease and increases the risk of stroke.  If you are 57-34 years old, ask your health care provider if you should take aspirin to prevent a heart attack or a stroke.  Do not use any tobacco products, including cigarettes, chewing tobacco, or electronic cigarettes. If you need help quitting, ask your health care provider.  It is important to eat a healthy diet and maintain a healthy weight. ? Be sure to include plenty of vegetables, fruits, low-fat dairy  products, and lean protein. ? Avoid eating foods that are high in solid fats, added sugars, or salt (sodium).  Get regular exercise. This is one of the most important things that you can do for your health. ? Try to exercise for at least 150 minutes each week. The type of exercise that you do should  increase your heart rate and make you sweat. This is known as moderate-intensity exercise. ? Try to do strengthening exercises at least twice each week. Do these in addition to the moderate-intensity exercise.  Know your numbers.Ask your health care provider to check your cholesterol and your blood glucose. Continue to have your blood tested as directed by your health care provider.  What should I know about cancer screening? There are several types of cancer. Take the following steps to reduce your risk and to catch any cancer development as early as possible. Breast Cancer  Practice breast self-awareness. ? This means understanding how your breasts normally appear and feel. ? It also means doing regular breast self-exams. Let your health care provider know about any changes, no matter how small.  If you are 33 or older, have a clinician do a breast exam (clinical breast exam or CBE) every year. Depending on your age, family history, and medical history, it may be recommended that you also have a yearly breast X-ray (mammogram).  If you have a family history of breast cancer, talk with your health care provider about genetic screening.  If you are at high risk for breast cancer, talk with your health care provider about having an MRI and a mammogram every year.  Breast cancer (BRCA) gene test is recommended for women who have family members with BRCA-related cancers. Results of the assessment will determine the need for genetic counseling and BRCA1 and for BRCA2 testing. BRCA-related cancers include these types: ? Breast. This occurs in males or females. ? Ovarian. ? Tubal. This may also be called fallopian tube cancer. ? Cancer of the abdominal or pelvic lining (peritoneal cancer). ? Prostate. ? Pancreatic.  Cervical, Uterine, and Ovarian Cancer Your health care provider may recommend that you be screened regularly for cancer of the pelvic organs. These include your ovaries, uterus,  and vagina. This screening involves a pelvic exam, which includes checking for microscopic changes to the surface of your cervix (Pap test).  For women ages 21-65, health care providers may recommend a pelvic exam and a Pap test every three years. For women ages 25-65, they may recommend the Pap test and pelvic exam, combined with testing for human papilloma virus (HPV), every five years. Some types of HPV increase your risk of cervical cancer. Testing for HPV may also be done on women of any age who have unclear Pap test results.  Other health care providers may not recommend any screening for nonpregnant women who are considered low risk for pelvic cancer and have no symptoms. Ask your health care provider if a screening pelvic exam is right for you.  If you have had past treatment for cervical cancer or a condition that could lead to cancer, you need Pap tests and screening for cancer for at least 20 years after your treatment. If Pap tests have been discontinued for you, your risk factors (such as having a new sexual partner) need to be reassessed to determine if you should start having screenings again. Some women have medical problems that increase the chance of getting cervical cancer. In these cases, your health care provider may recommend that  you have screening and Pap tests more often.  If you have a family history of uterine cancer or ovarian cancer, talk with your health care provider about genetic screening.  If you have vaginal bleeding after reaching menopause, tell your health care provider.  There are currently no reliable tests available to screen for ovarian cancer.  Lung Cancer Lung cancer screening is recommended for adults 68-58 years old who are at high risk for lung cancer because of a history of smoking. A yearly low-dose CT scan of the lungs is recommended if you:  Currently smoke.  Have a history of at least 30 pack-years of smoking and you currently smoke or have quit  within the past 15 years. A pack-year is smoking an average of one pack of cigarettes per day for one year.  Yearly screening should:  Continue until it has been 15 years since you quit.  Stop if you develop a health problem that would prevent you from having lung cancer treatment.  Colorectal Cancer  This type of cancer can be detected and can often be prevented.  Routine colorectal cancer screening usually begins at age 66 and continues through age 98.  If you have risk factors for colon cancer, your health care provider may recommend that you be screened at an earlier age.  If you have a family history of colorectal cancer, talk with your health care provider about genetic screening.  Your health care provider may also recommend using home test kits to check for hidden blood in your stool.  A small camera at the end of a tube can be used to examine your colon directly (sigmoidoscopy or colonoscopy). This is done to check for the earliest forms of colorectal cancer.  Direct examination of the colon should be repeated every 5-10 years until age 47. However, if early forms of precancerous polyps or small growths are found or if you have a family history or genetic risk for colorectal cancer, you may need to be screened more often.  Skin Cancer  Check your skin from head to toe regularly.  Monitor any moles. Be sure to tell your health care provider: ? About any new moles or changes in moles, especially if there is a change in a mole's shape or color. ? If you have a mole that is larger than the size of a pencil eraser.  If any of your family members has a history of skin cancer, especially at a young age, talk with your health care provider about genetic screening.  Always use sunscreen. Apply sunscreen liberally and repeatedly throughout the day.  Whenever you are outside, protect yourself by wearing long sleeves, pants, a wide-brimmed hat, and sunglasses.  What should I know  about osteoporosis? Osteoporosis is a condition in which bone destruction happens more quickly than new bone creation. After menopause, you may be at an increased risk for osteoporosis. To help prevent osteoporosis or the bone fractures that can happen because of osteoporosis, the following is recommended:  If you are 29-40 years old, get at least 1,000 mg of calcium and at least 600 mg of vitamin D per day.  If you are older than age 46 but younger than age 31, get at least 1,200 mg of calcium and at least 600 mg of vitamin D per day.  If you are older than age 38, get at least 1,200 mg of calcium and at least 800 mg of vitamin D per day.  Smoking and excessive alcohol intake increase  the risk of osteoporosis. Eat foods that are rich in calcium and vitamin D, and do weight-bearing exercises several times each week as directed by your health care provider. What should I know about how menopause affects my mental health? Depression may occur at any age, but it is more common as you become older. Common symptoms of depression include:  Low or sad mood.  Changes in sleep patterns.  Changes in appetite or eating patterns.  Feeling an overall lack of motivation or enjoyment of activities that you previously enjoyed.  Frequent crying spells.  Talk with your health care provider if you think that you are experiencing depression. What should I know about immunizations? It is important that you get and maintain your immunizations. These include:  Tetanus, diphtheria, and pertussis (Tdap) booster vaccine.  Influenza every year before the flu season begins.  Pneumonia vaccine.  Shingles vaccine.  Your health care provider may also recommend other immunizations. This information is not intended to replace advice given to you by your health care provider. Make sure you discuss any questions you have with your health care provider. Document Released: 02/18/2005 Document Revised: 07/17/2015  Document Reviewed: 09/30/2014 Elsevier Interactive Patient Education  2018 [employer].

## 2016-11-04 ENCOUNTER — Other Ambulatory Visit: Payer: Self-pay | Admitting: Internal Medicine

## 2016-11-04 ENCOUNTER — Encounter: Payer: Self-pay | Admitting: Family Medicine

## 2016-11-04 DIAGNOSIS — I10 Essential (primary) hypertension: Secondary | ICD-10-CM

## 2016-11-11 NOTE — Progress Notes (Signed)
[place] [facility] [address] [place],  [address] [phone]  Date:  11/14/2016   Name:  Theresa Mejia   DOB:  [birth year]   MRN:  [id number]  PCP:  Darreld Mclean, MD    Chief Complaint: Hip Pain (Pt reports left hip pain )   History of Present Illness:  Theresa Mejia is a 76 y.o. very pleasant female patient who presents with the following: History of HTN, hypothyroidism, hyperlipidemia  Here today with concern of LEFT hip pain.  She has seen Dr. Ricki Rodriguez for this in the past.  He said this was bursitis.  When it gets inflamed- which is not often- she had a very hard time getting around and using her left leg He would normally put her on NSAIDs, sometimes an oral steroid, and sometimes has a "shot" per [place] [facility] to the ?SI joint She is using ibuprofen already which is helping some- she is taking 600 to 800 mg per dose She has used voltaren - gel and PO, and has also used mobic   She admits she does not always use the NSAID on a regular basis  NKI She does exercise on a regular basis.   She does exercise classes, but keeps arm weights light It is a seniors class and she does not think that she hurt herself at all   She had a pacemaker placed a year ago.  She donated blood yesterday and still feels a bit lightheaded since.  After she finished her donation, she sat up, felt dizzy, was told to lie back down again She then vomited twice after trying to drink a coke while in trendelenburg.  She has never had such a strong reaction to donating blood in the past- she has been a regular donor over the years  She is now feeling pretty much back to normal [facility] TSH looked ok   Lab Results  Component Value Date   TSH 1.08 07/14/2016     Patient Active Problem List   Diagnosis Date Noted  . Viral URI with cough 04/20/2016  . Sinus node dysfunction ([place]) 11/11/2015  . Routine general medical examination at a  health care facility 06/04/2015  . Insomnia with sleep apnea 06/04/2015  . Bronchitis, chronic obstructive w acute bronchitis ([place]) 12/10/2014  . Lumbar disc disease 09/01/2010  . Obstructive sleep apnea 05/31/2007  . Hyperlipidemia with target LDL less than 130 02/05/2007  . Depression with anxiety 02/05/2007  . Allergic rhinitis 02/05/2007  . Hypothyroidism 02/02/2007  . Essential hypertension 02/02/2007    Past Medical History:  Diagnosis Date  . Allergic rhinitis   . Depression   . DJD (degenerative joint disease)   . GERD (gastroesophageal reflux disease)   . Headache(784.0)   . Hypercholesteremia   . Hypersomnia   . Hypertension   . Hypothyroidism   . Internal hemorrhoid 11/2008   s/p banding ([place])  . Lumbar disc disease   . OBSTRUCTIVE SLEEP APNEA    NPSG 04/29/07- AHI 1.1/hr, RDI 21.2/hr. Weight 176 lbs. CPAP was tried based on the RDI.    [name] OVERACTIVE BLADDER   . Presence of permanent cardiac pacemaker 11/11/2015  . Reactive depression (situational)   . Sinus node dysfunction ([facility])   . Spondylosis, cervical     Past Surgical History:  Procedure Laterality Date  . BIOPSY BREAST    . eye lid lift    . INSERT / REPLACE /  REMOVE PACEMAKER  11/11/2015  . KNEE ARTHROSCOPY     Dr. [name]  . rotator cuff surgery     Dr. [name]  . TUBAL LIGATION      Social History   Tobacco Use  . Smoking status: Former Smoker    Last attempt to quit: 01/11/1968    Years since quitting: 48.8  . Smokeless tobacco: Never Used  Substance Use Topics  . Alcohol use: No  . Drug use: No    Family History  Problem Relation Age of Onset  . Crohn's disease Father 47  . Macular degeneration Mother   . Cancer Mother 97       bladder  . Hypothyroidism Mother   . Tremor Mother   . Stroke Mother 41  . Parkinsonism Maternal Grandmother     Allergies  Allergen Reactions  . Ramipril     REACTION: pt states INTOLERANT to Altace  . Sulfa Antibiotics     Says it dropped her  blood pressure really low.   . Sulfonamide Derivatives     REACTION: hypotension    Medication list has been reviewed and updated.  Current Outpatient Medications on File Prior to Visit  Medication Sig Dispense Refill  . ALPRAZolam (XANAX) 0.25 MG tablet Take 0.5 tablets (0.125 mg total) by mouth at bedtime as needed for anxiety or sleep. 30 tablet 1  . amoxicillin (AMOXIL) 500 MG capsule Take 2000 mg (4 caps) 1 hour prior to dental treatment 4 capsule 0  . chlorthalidone (HYGROTON) 25 MG tablet Take 0.5 tablets (12.5 mg total) by mouth daily. 45 tablet 3  . Cholecalciferol (VITAMIN D3) 2000 UNITS TABS Take 1 capsule by mouth daily.     . Cyanocobalamin (B-12) 5000 MCG CAPS Take 5,000 mcg by mouth daily.    . famotidine (PEPCID) 20 MG tablet Take 20 mg by mouth at bedtime.     [name] FLUoxetine (PROZAC) 40 MG capsule Take 1 capsule (40 mg total) by mouth daily. 90 capsule 1  . fluticasone (FLONASE) 50 MCG/ACT nasal spray Place 2 sprays into both nostrils daily. 16 g 3  . HYDROcodone-homatropine (HYCODAN) 5-1.5 MG/5ML syrup Take 5 mLs by mouth every 8 (eight) hours as needed for cough. 120 mL 0  . levocetirizine (XYZAL) 5 MG tablet Take 1 tablet (5 mg total) by mouth every evening. 90 tablet 3  . levothyroxine (SYNTHROID, LEVOTHROID) 125 MCG tablet Take 62.5 mcg by mouth daily before breakfast.     . telmisartan (MICARDIS) 80 MG tablet TAKE 1 TABLET EVERY DAY 90 tablet 3   No current facility-administered medications on file prior to visit.     Review of Systems:  As per HPI- otherwise negative. No fever or chills No CP or SOB No vomiting or diarrhea today  Physical Examination: Vitals:   11/14/16 1051  BP: 108/64  Pulse: 78  Resp: 16  Temp: 98 F (36.7 C)  SpO2: 100%   Vitals:   11/14/16 1051  Weight: 153 lb 3.2 oz (69.5 kg)   Body mass index is 29.92 kg/m. Ideal Body Weight:    GEN: WDWN, NAD, Non-toxic, A & O x 3, overweight, looks well HEENT: Atraumatic,  Normocephalic. Neck supple. No masses, No LAD. Ears and Nose: No external deformity. CV: RRR, No M/G/R. No JVD. No thrill. No extra heart sounds. PULM: CTA B, no wheezes, crackles, rhonchi. No retractions. No resp. distress. No accessory muscle use. ABD: S, NT, ND. No rebound. No HSM. EXTR: No c/c/e NEURO Normal gait.  PSYCH: Normally interactive. Conversant. Not depressed or anxious appearing.  Calm demeanor.  Left hip: she does not have significant tenderness over the greater trochanter, and has good ROM of the hip. He pain is mostly over the posterior pelvis- the SI joint seems most likely cause Normal strength and DTR of bilateral legs   Assessment and Plan: Chronic left SI joint pain  Likely SI joint pain- has been treated with oral steroids in the past with good success Will treat with prednisone as below She does not have DM Hold NSAIDs while on prednisone She will let me know if this is not helpful to her Meds ordered this encounter  Medications  . predniSONE (DELTASONE) 20 MG tablet    Sig: Take 2 pills daily for 5 days, then 1 pill daily for 5 days    Dispense:  15 tablet    Refill:  0      Signed Lamar Blinks, MD

## 2016-11-14 ENCOUNTER — Ambulatory Visit: Payer: PPO | Admitting: Family Medicine

## 2016-11-14 ENCOUNTER — Encounter: Payer: Self-pay | Admitting: Family Medicine

## 2016-11-14 VITALS — BP 108/64 | HR 78 | Temp 98.0°F | Resp 16 | Wt 153.2 lb

## 2016-11-14 DIAGNOSIS — G8929 Other chronic pain: Secondary | ICD-10-CM

## 2016-11-14 DIAGNOSIS — M533 Sacrococcygeal disorders, not elsewhere classified: Secondary | ICD-10-CM

## 2016-11-14 MED ORDER — PREDNISONE 20 MG PO TABS
ORAL_TABLET | ORAL | 0 refills | Status: DC
Start: 2016-11-14 — End: 2016-12-06

## 2016-11-14 NOTE — Patient Instructions (Signed)
I think the issue with your left hip is either the SI joint or the sciatic notch If needed, you can fill and use the prednisone rx- remember, while you are taking this avoid taking NSAID medications as well Please let me know if the pain fails to respond to the prednisone!    Otherwise we can plan to visit in the spring for recheck visit Take care!

## 2016-11-16 ENCOUNTER — Ambulatory Visit (INDEPENDENT_AMBULATORY_CARE_PROVIDER_SITE_OTHER): Payer: PPO | Admitting: *Deleted

## 2016-11-16 DIAGNOSIS — R001 Bradycardia, unspecified: Secondary | ICD-10-CM

## 2016-11-16 NOTE — Progress Notes (Signed)
Remote pacemaker transmission.   

## 2016-11-17 ENCOUNTER — Encounter: Payer: Self-pay | Admitting: Cardiology

## 2016-11-25 LAB — CUP PACEART REMOTE DEVICE CHECK
Brady Statistic AP VP Percent: 0 %
Brady Statistic AP VS Percent: 100 %
Brady Statistic AS VP Percent: 0 %
Brady Statistic AS VS Percent: 0 %
Implantable Lead Implant Date: 20171101
Implantable Lead Location: 753860
Implantable Lead Model: 377
Implantable Lead Model: 377
Implantable Lead Serial Number: 49553678
Lead Channel Impedance Value: 450 Ohm
Lead Channel Impedance Value: 683 Ohm
Lead Channel Pacing Threshold Pulse Width: 0.4 ms
Lead Channel Pacing Threshold Pulse Width: 0.4 ms
Lead Channel Setting Pacing Amplitude: 2 V
MDC IDC LEAD IMPLANT DT: 20171101
MDC IDC LEAD LOCATION: 753859
MDC IDC LEAD SERIAL: 49617393
MDC IDC MSMT LEADCHNL RA PACING THRESHOLD AMPLITUDE: 0.7 V
MDC IDC MSMT LEADCHNL RV PACING THRESHOLD AMPLITUDE: 0.9 V
MDC IDC PG IMPLANT DT: 20171101
MDC IDC SESS DTM: 20181116053356
MDC IDC SET LEADCHNL RV PACING AMPLITUDE: 2.4 V
MDC IDC SET LEADCHNL RV PACING PULSEWIDTH: 0.4 ms
MDC IDC STAT BRADY RA PERCENT PACED: 100 %
MDC IDC STAT BRADY RV PERCENT PACED: 0 %
Pulse Gen Serial Number: 68817609

## 2016-11-30 ENCOUNTER — Telehealth: Payer: Self-pay | Admitting: Internal Medicine

## 2016-11-30 NOTE — Telephone Encounter (Signed)
Returned call to patient and informed her of Dr Tanna Furry recommendations to hold Micardis if her BP is running low and she is symptomatic(lightheaded).  She says she was told that after a PPM implant automatic BP cuffs are not accurate.  She did not record her readings but says remembers them being low for her but the HR was higher than usual.  I have asked her to record her readingd over the weekend with any symptoms and bring with her to her appointment on Tues.  She will hold Micardis if BP low and and/or having lightheadedness.  She was appreciative of my return call.

## 2016-11-30 NOTE — Telephone Encounter (Signed)
New message   Patient concerned about being lightheaded more often. Concerned about using proper blood pressure cuff.     STAT if patient feels like he/she is going to faint   1) Are you dizzy now? NO  2) Do you feel faint or have you passed out? NO  3) Do you have any other symptoms? Light headed often  4) Have you checked your HR and BP (record if available)? Low per pt

## 2016-12-05 NOTE — Progress Notes (Signed)
Cardiology Office Note    Date:  12/06/2016   ID:  Theresa Mejia, DOB 1940/06/11, MRN [id number]  PCP:  Darreld Mclean, MD  Cardiologist: Dr. Marlou Porch  EPS:Dr. Cristopher Peru  Chief Complaint  Patient presents with  . Dizziness    History of Present Illness:  Theresa Mejia is a 76 y.o. female with history of sinus node dysfunction status post pacemaker insertion, palpitations since pacemaker placed with a few episodes of SVT lasting only a few seconds not placed on antiarrhythmic therapy when seen by Dr. Lovena Le 02/2016.  Patient wanted to undergo watchful waiting.  Also has hypertension, HLD.  Patient recently called in because of low blood pressure readings but heart rate was higher than usual.  Dr. Lovena Le told her to hold her Micardis if her blood pressure was running low and she was lightheaded.  Patient comes in today complaining of one month history of dizziness when she stands up.  She has had no change in her health, says she may not drink enough and has lost 2 pounds.  She denies any chest pain, dyspnea, or presyncope.  She does not feel like she is going to pass out.  She denies any bleeding or black stools.  She has had increased indigestion and is scheduled to see Dr. Earlean Shawl tomorrow.  She is quite orthostatic in the office today.  She has not held her mycardis at all.  She has not had any fast heart rates either.    Past Medical History:  Diagnosis Date  . Allergic rhinitis   . Depression   . DJD (degenerative joint disease)   . GERD (gastroesophageal reflux disease)   . Headache(784.0)   . Hypercholesteremia   . Hypersomnia   . Hypertension   . Hypothyroidism   . Internal hemorrhoid 11/2008   s/p banding ([facility])  . Lumbar disc disease   . OBSTRUCTIVE SLEEP APNEA    NPSG 04/29/07- AHI 1.1/hr, RDI 21.2/hr. Weight 176 lbs. CPAP was tried based on the RDI.    [name] OVERACTIVE BLADDER   . Presence of permanent cardiac pacemaker 11/11/2015  . Reactive depression  (situational)   . Sinus node dysfunction (HCC)   . Spondylosis, cervical     Past Surgical History:  Procedure Laterality Date  . BIOPSY BREAST    . EP IMPLANTABLE DEVICE N/A 11/11/2015   Procedure: Pacemaker Implant;  Surgeon: Evans Lance, MD;  Location: [facility];  Service: Cardiovascular;  Laterality: N/A;  . eye lid lift    . INSERT / REPLACE / REMOVE PACEMAKER  11/11/2015  . KNEE ARTHROSCOPY     Dr. [name]  . rotator cuff surgery     Dr. [name]  . TUBAL LIGATION      Current Medications: Current Meds  Medication Sig  . ALPRAZolam (XANAX) 0.25 MG tablet Take 0.5 tablets (0.125 mg total) by mouth at bedtime as needed for anxiety or sleep.  . Cholecalciferol (VITAMIN D3) 2000 UNITS TABS Take 1 capsule by mouth daily.   . Cyanocobalamin (B-12) 5000 MCG CAPS Take 5,000 mcg by mouth daily.  . famotidine (PEPCID) 20 MG tablet Take 20 mg by mouth at bedtime.   [name] FLUoxetine (PROZAC) 40 MG capsule Take 1 capsule (40 mg total) by mouth daily.  . fluticasone (FLONASE) 50 MCG/ACT nasal spray Place 2 sprays into both nostrils daily.  [name] levocetirizine (XYZAL) 5 MG tablet Take 1 tablet (5 mg total) by mouth every evening.  [name] levothyroxine (SYNTHROID, LEVOTHROID)  125 MCG tablet Take 62.5 mcg by mouth daily before breakfast.   . [DISCONTINUED] chlorthalidone (HYGROTON) 25 MG tablet Take 0.5 tablets (12.5 mg total) by mouth daily.  . [DISCONTINUED] telmisartan (MICARDIS) 80 MG tablet TAKE 1 TABLET EVERY DAY     Allergies:   Ramipril; Sulfa antibiotics; and Sulfonamide derivatives   Social History   Socioeconomic History  . Marital status: Married    Spouse name: None  . Number of children: 2  . Years of education: None  . Highest education level: None  Social Needs  . Financial resource strain: None  . Food insecurity - worry: None  . Food insecurity - inability: None  . Transportation needs - medical: None  . Transportation needs - non-medical: None  Occupational  History  . 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 Smoking status: Former Smoker    Last attempt to quit: 01/11/1968    Years since quitting: 48.9  . Smokeless tobacco: Never Used  Substance and Sexual Activity  . Alcohol use: No  . Drug use: No  . Sexual activity: No  Other Topics Concern  . None  Social History Narrative   married, lives with spouse -   Retired 05/2013     Family History:  The patient's   family history includes Cancer (age of onset: 64) in her mother; Crohn's disease (age of onset: 63) in her father; Hypothyroidism in her mother; Macular degeneration in her mother; Parkinsonism in her maternal grandmother; Stroke (age of onset: 1) in her mother; Tremor in her mother.   ROS:   Please see the history of present illness.    Review of Systems  Constitution: Negative.  HENT: Negative.   Eyes: Negative.   Cardiovascular: Negative.   Respiratory: Negative.   Hematologic/Lymphatic: Negative.   Musculoskeletal: Negative.  Negative for joint pain.  Gastrointestinal: Negative.   Genitourinary: Negative.   Neurological: Positive for dizziness.   All other systems reviewed and are negative.   PHYSICAL EXAM:   VS:  BP 114/80   Pulse 75   Resp 16   Ht 5' (1.524 m)   Wt 150 lb 1.9 oz (68.1 kg)   SpO2 98%   BMI 29.32 kg/m   Physical Exam  GEN: Well nourished, well developed, in no acute distress  Neck: no JVD, carotid bruits, or masses Cardiac:RRR; no murmurs, rubs, or gallops  Respiratory:  clear to auscultation bilaterally, normal work of breathing GI: soft, nontender, nondistended, + BS Ext: without cyanosis, clubbing, or edema, Good distal pulses bilaterally Neuro:  Alert and Oriented x 3  Psych: euthymic mood, full affect  Wt Readings from Last 3 Encounters:  12/06/16 150 lb 1.9 oz (68.1 kg)  11/14/16 153 lb 3.2 oz (69.5 kg)  11/03/16 153 lb 9.6 oz (69.7 kg)      Studies/Labs Reviewed:    EKG:  EKG is ordered today.  The ekg ordered today demonstrates atrial paced  Recent Labs: 07/14/2016: TSH 1.08 11/03/2016: ALT 18; BUN 18; Creatinine, Ser 0.76; Hemoglobin 12.6; Platelets 267.0; Potassium 3.6; Sodium 135   Lipid Panel    Component Value Date/Time   CHOL 194 11/03/2016 1104   TRIG 129.0 11/03/2016 1104   HDL 35.70 (L) 11/03/2016 1104   CHOLHDL 5 11/03/2016 1104   VLDL 25.8 11/03/2016 1104   LDLCALC 132 (H) 11/03/2016 1104   LDLDIRECT 148.3 09/01/2010 1041    Additional studies/ records that were reviewed  today include:   2D echo 8/17/17Study Conclusions   - Left ventricle: The cavity size was normal. Wall thickness was   normal. Systolic function was normal. The estimated ejection   fraction was in the range of 55% to 60%. Wall motion was normal;   there were no regional wall motion abnormalities. Left   ventricular diastolic function parameters were normal. - Aortic valve: There was trivial regurgitation. - Mitral valve: There was mild regurgitation. - Left atrium: The atrium was mildly dilated. - Atrial septum: No defect or patent foramen ovale was identified. - Impressions: Normal GLS -22.7.   Impressions:   - Normal GLS -22.7.   Nuclear stress test 8/24/17Study Highlights    Nuclear stress EF: 64%.  Defect 1: There is a small defect of mild severity present in the apex location.  The study is normal.  This is a low risk study.  The left ventricular ejection fraction is normal (55-65%).         ASSESSMENT:    1. Sinus node dysfunction (HCC)   2. Essential hypertension   3. Hyperlipidemia with target LDL less than 130      PLAN:  In order of problems listed above:  Orthostatic hypotension patient's blood pressure dropped from 114 over 80-112/64 sitting and 86/56 standing.  Will stop chlorthalidone and decrease Micardis to 40 mg daily.  I will see her back in 2 weeks for follow-up.  Instructed to transition slowly when changing  positions.  Increase fluid intake.  We will not check labs as she just had them at the end of October and she is seeing GI tomorrow.  Sinus node dysfunction status post permanent pacemaker-he has had some breakthrough SVT on Micardis.  She is to call if fast heart rates with decrease in Micardis.  Essential hypertension patient is orthostatic today.  See above  Hyperlipidemia she is considering taking medication.    Medication Adjustments/Labs and Tests Ordered: Current medicines are reviewed at length with the patient today.  Concerns regarding medicines are outlined above.  Medication changes, Labs and Tests ordered today are listed in the Patient Instructions below. There are no Patient Instructions on file for this visit.   Sumner Boast, PA-C  12/06/2016 8:56 AM    [facility] [address], [place], [place]  [address] Phone: [phone]; Fax: [phone]

## 2016-12-06 ENCOUNTER — Encounter: Payer: Self-pay | Admitting: Physician Assistant

## 2016-12-06 ENCOUNTER — Ambulatory Visit: Payer: PPO | Admitting: Physician Assistant

## 2016-12-06 VITALS — BP 114/80 | HR 75 | Resp 16 | Ht 60.0 in | Wt 150.1 lb

## 2016-12-06 DIAGNOSIS — E785 Hyperlipidemia, unspecified: Secondary | ICD-10-CM | POA: Diagnosis not present

## 2016-12-06 DIAGNOSIS — I951 Orthostatic hypotension: Secondary | ICD-10-CM | POA: Diagnosis not present

## 2016-12-06 DIAGNOSIS — I10 Essential (primary) hypertension: Secondary | ICD-10-CM | POA: Diagnosis not present

## 2016-12-06 DIAGNOSIS — I495 Sick sinus syndrome: Secondary | ICD-10-CM | POA: Diagnosis not present

## 2016-12-06 MED ORDER — TELMISARTAN 80 MG PO TABS: 40.0000 mg | ORAL_TABLET | Freq: Every day | ORAL | 3 refills | Status: DC

## 2016-12-06 NOTE — Patient Instructions (Addendum)
Medication Instructions:  1. STOP CHLORTHALIDONE  2. DECREASE MICARDIS TO 40 MG DAILY; (THIS WILL BE 1/2 TABLET DAILY OF THE 80 MG TABLET)  Labwork: NONE ORDERED TODAY  Testing/Procedures: NONE ORDERED TODAY  Follow-Up: 12/20/16 @ 9 AM WITH MICHELE LENZE, PAC   Any Other Special Instructions Will Be Listed Below (If Applicable). USE CAUTION WHEN CHANGING POSITIONS, (IE SITTING TO STANDING, LYING DOWN TO SITTING); WHEN STANDING, BE SURE TO STAND FOR A MINUTE OR SO TO MAKE SURE YOU ARE STABLE BEFORE WALKING .  MAKE SURE TO INCREASE FLUID INTAKE (WATER)  If you need a refill on your cardiac medications before your next appointment, please call your pharmacy.

## 2016-12-08 DIAGNOSIS — K648 Other hemorrhoids: Secondary | ICD-10-CM | POA: Diagnosis not present

## 2016-12-08 DIAGNOSIS — K219 Gastro-esophageal reflux disease without esophagitis: Secondary | ICD-10-CM | POA: Insufficient documentation

## 2016-12-08 DIAGNOSIS — E039 Hypothyroidism, unspecified: Secondary | ICD-10-CM | POA: Insufficient documentation

## 2016-12-08 DIAGNOSIS — R197 Diarrhea, unspecified: Secondary | ICD-10-CM | POA: Diagnosis not present

## 2016-12-08 DIAGNOSIS — Z860101 Personal history of adenomatous and serrated colon polyps: Secondary | ICD-10-CM | POA: Insufficient documentation

## 2016-12-20 ENCOUNTER — Ambulatory Visit: Payer: Self-pay | Admitting: Physician Assistant

## 2016-12-22 ENCOUNTER — Encounter: Payer: Self-pay | Admitting: Family Medicine

## 2016-12-23 MED ORDER — FLUTICASONE PROPIONATE 50 MCG/ACT NA SUSP: 2.0000 | Freq: Every day | NASAL | 5 refills | Status: DC

## 2017-01-05 ENCOUNTER — Telehealth: Payer: Self-pay | Admitting: Internal Medicine

## 2017-01-05 NOTE — Telephone Encounter (Signed)
New Message  Pt call requesting to speak with RN about her medication changes that were done in her appt on 11/27. Pt states she been experiencing lightheadedness, and dizziness. Pt feels she needs to be seen sooner than 1/7. Please call back to discuss

## 2017-01-06 ENCOUNTER — Ambulatory Visit: Payer: PPO | Admitting: Cardiology

## 2017-01-06 ENCOUNTER — Encounter: Payer: Self-pay | Admitting: Cardiology

## 2017-01-06 VITALS — BP 145/89 | HR 76 | Ht 60.0 in | Wt 153.0 lb

## 2017-01-06 DIAGNOSIS — R42 Dizziness and giddiness: Secondary | ICD-10-CM

## 2017-01-06 MED ORDER — TELMISARTAN 20 MG PO TABS: 20.0000 mg | ORAL_TABLET | Freq: Two times a day (BID) | ORAL | 3 refills | Status: DC

## 2017-01-06 NOTE — Patient Instructions (Signed)
Medication Instructions:  Your physician has recommended you make the following change in your medication:  DECREASE Micardis to 20 mg twice daily   Labwork: None Ordered   Testing/Procedures: None Ordered   Follow-Up: Your physician wants you to follow-up in: 10 months with Dr. Lovena Le. You will receive a reminder letter in the mail two months in advance. If you don't receive a letter, please call our office to schedule the follow-up appointment.   If you need a refill on your cardiac medications before your next appointment, please call your pharmacy.   Thank you for choosing [facility]! Christen Bame, RN [phone]    Orthostatic Hypotension Orthostatic hypotension is a sudden drop in blood pressure that happens when you quickly change positions, such as when you get up from a seated or lying position. Blood pressure is a measurement of how strongly, or weakly, your blood is pressing against the walls of your arteries. Arteries are blood vessels that carry blood from your heart throughout your body. When blood pressure is too low, you may not get enough blood to your brain or to the rest of your organs. This can cause weakness, light-headedness, rapid heartbeat, and fainting. This can last for just a few seconds or for up to a few minutes. Orthostatic hypotension is usually not a serious problem. However, if it happens frequently or gets worse, it may be a sign of something more serious. What are the causes? This condition may be caused by:  Sudden changes in posture, such as standing up quickly after you have been sitting or lying down.  Blood loss.  Loss of body fluids (dehydration).  Heart problems.  Hormone (endocrine) problems.  Pregnancy.  Severe infection.  Lack of certain nutrients.  Severe allergic reactions (anaphylaxis).  Certain medicines, such as blood pressure medicine or medicines that make the body lose excess fluids (diuretics). Sometimes,  this condition can be caused by not taking medicine as directed, such as taking too much of a certain medicine.  What increases the risk? Certain factors can make you more likely to develop orthostatic hypotension, including:  Age. Risk increases as you get older.  Conditions that affect the heart or the central nervous system.  Taking certain medicines, such as blood pressure medicine or diuretics.  Being pregnant.  What are the signs or symptoms? Symptoms of this condition may include:  Weakness.  Light-headedness.  Dizziness.  Blurred vision.  Fatigue.  Rapid heartbeat.  Fainting, in severe cases.  How is this diagnosed? This condition is diagnosed based on:  Your medical history.  Your symptoms.  Your blood pressure measurement. Your health care provider will check your blood pressure when you are: ? Lying down. ? Sitting. ? Standing.  A blood pressure reading is recorded as two numbers, such as "120 over 80" (or 120/80). The first ("top") number is called the systolic pressure. It is a measure of the pressure in your arteries as your heart beats. The second ("bottom") number is called the diastolic pressure. It is a measure of the pressure in your arteries when your heart relaxes between beats. Blood pressure is measured in a unit called mm Hg. Healthy blood pressure for adults is 120/80. If your blood pressure is below 90/60, you may be diagnosed with hypotension. Other information or tests that may be used to diagnose orthostatic hypotension include:  Your other vital signs, such as your heart rate and temperature.  Blood tests.  Tilt table test. For this test, you will be  safely secured to a table that moves you from a lying position to an upright position. Your heart rhythm and blood pressure will be monitored during the test.  How is this treated? Treatment for this condition may include:  Changing your diet. This may involve eating more salt (sodium)  or drinking more water.  Taking medicines to raise your blood pressure.  Changing the dosage of certain medicines you are taking that might be lowering your blood pressure.  Wearing compression stockings. These stockings help to prevent blood clots and reduce swelling in your legs.  In some cases, you may need to go to the hospital for:  Fluid replacement. This means you will receive fluids through an IV tube.  Blood replacement. This means you will receive donated blood through an IV tube (transfusion).  Treating an infection or heart problems, if this applies.  Monitoring. You may need to be monitored while medicines that you are taking wear off.  Follow these instructions at home: Eating and drinking   Drink enough fluid to keep your urine clear or pale yellow.  Eat a healthy diet and follow instructions from your health care provider about eating or drinking restrictions. A healthy diet includes: ? Fresh fruits and vegetables. ? Whole grains. ? Lean meats. ? Low-fat dairy products.  Eat extra salt only as directed. Do not add extra salt to your diet unless your health care provider told you to do that.  Eat frequent, small meals.  Avoid standing up suddenly after eating. Medicines  Take over-the-counter and prescription medicines only as told by your health care provider. ? Follow instructions from your health care provider about changing the dosage of your current medicines, if this applies. ? Do not stop or adjust any of your medicines on your own. General instructions  Wear compression stockings as told by your health care provider.  Get up slowly from lying down or sitting positions. This gives your blood pressure a chance to adjust.  Avoid hot showers and excessive heat as directed by your health care provider.  Return to your normal activities as told by your health care provider. Ask your health care provider what activities are safe for you.  Do not use  any products that contain nicotine or tobacco, such as cigarettes and e-cigarettes. If you need help quitting, ask your health care provider.  Keep all follow-up visits as told by your health care provider. This is important. Contact a health care provider if:  You vomit.  You have diarrhea.  You have a fever for more than 2-3 days.  You feel more thirsty than usual.  You feel weak and tired. Get help right away if:  You have chest pain.  You have a fast or irregular heartbeat.  You develop numbness in any part of your body.  You cannot move your arms or your legs.  You have trouble speaking.  You become sweaty or feel lightheaded.  You faint.  You feel short of breath.  You have trouble staying awake.  You feel confused. This information is not intended to replace advice given to you by your health care provider. Make sure you discuss any questions you have with your health care provider. Document Released: 12/17/2001 Document Revised: 09/15/2015 Document Reviewed: 06/19/2015 Elsevier Interactive Patient Education  2018 [employer].

## 2017-01-06 NOTE — Progress Notes (Signed)
01/06/2017 [name]   21-Jul-1940  [id number]  Primary Physician Copland, Gay Filler, MD Primary Cardiologist: Dr. Marlou Porch  Electrophysiology: Dr. Lovena Le   Reason for Visit/CC: F/u for Orthostatic Hypotension   HPI:  Theresa Mejia is a 76 y.o. female who is being seen today for f/u for orthostatic hypotension. She is followed by Dr. Marlou Porch and Dr. Lovena Le. She has a h/o sinus node dysfunction s/p PPM. Also with h/o PVCs.   She was seen recently 12/06/2016 by Ermalinda Barrios, PA-C due to low BP. Pt was orthostatic in clinic and symptomatic with dizziness upon standing. BP dropped from 112/64>>86/56 upon standing. Her meds were adjusted. Chlorthalidone was discontinued and Micardis was decreased to 40 mg daily. She was also advised to increase fluid intake.   She is back today for f/u. BP is better. No longer orthostatic. Lying>>sitting>>standing 145/89>>149/90>>155/94>>137/87. She is feeling better compared to previous office visit, but still occasionally tired. 3 days ago she felt dizzy with positional change that improved with meclizine. No dizziness today.   Current Meds  Medication Sig  . ALPRAZolam (XANAX) 0.25 MG tablet Take 0.5 tablets (0.125 mg total) by mouth at bedtime as needed for anxiety or sleep.  . Cholecalciferol (VITAMIN D3) 2000 UNITS TABS Take 1 capsule by mouth daily.   . Cyanocobalamin (B-12) 5000 MCG CAPS Take 5,000 mcg by mouth daily.  [name] FLUoxetine (PROZAC) 40 MG capsule Take 1 capsule (40 mg total) by mouth daily.  . fluticasone (FLONASE) 50 MCG/ACT nasal spray Place 2 sprays into both nostrils daily.  [name] levothyroxine (SYNTHROID, LEVOTHROID) 125 MCG tablet Take 62.5 mcg by mouth daily before breakfast.   . [DISCONTINUED] telmisartan (MICARDIS) 80 MG tablet Take 0.5 tablets (40 mg total) by mouth daily.   Allergies  Allergen Reactions  . Ramipril     REACTION: pt states INTOLERANT to Altace  . Sulfa Antibiotics     Says it dropped her blood pressure really low.    . Sulfonamide Derivatives     REACTION: hypotension   Past Medical History:  Diagnosis Date  . Allergic rhinitis   . Depression   . DJD (degenerative joint disease)   . GERD (gastroesophageal reflux disease)   . Headache(784.0)   . Hypercholesteremia   . Hypersomnia   . Hypertension   . Hypothyroidism   . Internal hemorrhoid 11/2008   s/p banding ([facility])  . Lumbar disc disease   . OBSTRUCTIVE SLEEP APNEA    NPSG 04/29/07- AHI 1.1/hr, RDI 21.2/hr. Weight 176 lbs. CPAP was tried based on the RDI.    [name] OVERACTIVE BLADDER   . Presence of permanent cardiac pacemaker 11/11/2015  . Reactive depression (situational)   . Sinus node dysfunction (HCC)   . Spondylosis, cervical    Family History  Problem Relation Age of Onset  . Crohn's disease Father 35  . Macular degeneration Mother   . Cancer Mother 49       bladder  . Hypothyroidism Mother   . Tremor Mother   . Stroke Mother 48  . Parkinsonism Maternal Grandmother    Past Surgical History:  Procedure Laterality Date  . BIOPSY BREAST    . EP IMPLANTABLE DEVICE N/A 11/11/2015   Procedure: Pacemaker Implant;  Surgeon: Evans Lance, MD;  Location: [facility];  Service: Cardiovascular;  Laterality: N/A;  . eye lid lift    . INSERT / REPLACE / REMOVE PACEMAKER  11/11/2015  . KNEE ARTHROSCOPY     Dr. [name]  .  rotator cuff surgery     Dr. [name]  . TUBAL LIGATION     Social History   Socioeconomic History  . Marital status: Married    Spouse name: Not on file  . Number of children: 2  . Years of education: Not on file  . Highest education level: Not on file  Social Needs  . Financial resource strain: Not on file  . Food insecurity - worry: Not on file  . Food insecurity - inability: Not on file  . Transportation needs - medical: Not on file  . Transportation needs - non-medical: Not on file  Occupational History  . 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 Smoking status: Former Smoker    Last attempt to quit: 01/11/1968    Years since quitting: 49.0  . Smokeless tobacco: Never Used  Substance and Sexual Activity  . Alcohol use: No  . Drug use: No  . Sexual activity: No  Other Topics Concern  . Not on file  Social History Narrative   married, lives with spouse -   Retired 05/2013     Review of Systems: General: negative for chills, fever, night sweats or weight changes.  Cardiovascular: negative for chest pain, dyspnea on exertion, edema, orthopnea, palpitations, paroxysmal nocturnal dyspnea or shortness of breath Dermatological: negative for rash Respiratory: negative for cough or wheezing Urologic: negative for hematuria Abdominal: negative for nausea, vomiting, diarrhea, bright red blood per rectum, melena, or hematemesis Neurologic: negative for visual changes, syncope, or dizziness All other systems reviewed and are otherwise negative except as noted above.   Physical Exam:  Blood pressure (!) 145/89, pulse 76, height 5' (1.524 m), weight 153 lb (69.4 kg).  General appearance: alert, cooperative and no distress Neck: no carotid bruit and no JVD Lungs: clear to auscultation bilaterally Heart: regular rate and rhythm, S1, S2 normal, no murmur, click, rub or gallop Extremities: extremities normal, atraumatic, no cyanosis or edema Pulses: 2+ and symmetric Skin: Skin color, texture, turgor normal. No rashes or lesions Neurologic: Grossly normal  EKG not performed -- personally reviewed   ASSESSMENT AND PLAN:   1. Orthostatic Hypotension: resolved after discontinuation of thiazide diuretic and reduction of Micardis from 80 mg to 40 mg. Orthostatic VS negative today. SBP in the 140s. Symptoms improved but still with some occasional fatigue and mild dizziness on occasion. We will keep current dose of Micardis the same but will do 20 mg in the am and 20 mg in the evening. Pt advised to continue to use cation with  positional changes, stay well hydrated with fluids and try use of compression stockings on days of prolonged standing/ walking. She will f/u with Dr. Lovena Le as directed for her PPM and will call if she is having recurrent issues with BP.   Kaiea Esselman Ladoris Gene, [facility] 01/06/2017 11:32 AM

## 2017-01-06 NOTE — Telephone Encounter (Signed)
Spoke with pt and apologized for delay in return call.  Verified that pt was aware of appt today.  Pt states she is aware.  Pt states she is feeling a little better today but is still nervous about position changes.  States dizziness/lightheadedness occurs with position changes.  Advised to keep appt for today.  Pt appreciative for call.

## 2017-01-16 ENCOUNTER — Ambulatory Visit: Payer: Self-pay | Admitting: Physician Assistant

## 2017-01-16 ENCOUNTER — Ambulatory Visit (INDEPENDENT_AMBULATORY_CARE_PROVIDER_SITE_OTHER): Payer: PPO | Admitting: Family Medicine

## 2017-01-16 ENCOUNTER — Encounter: Payer: Self-pay | Admitting: Family Medicine

## 2017-01-16 VITALS — BP 148/86 | HR 96 | Temp 98.2°F | Resp 16 | Ht 60.0 in | Wt 153.0 lb

## 2017-01-16 DIAGNOSIS — H8110 Benign paroxysmal vertigo, unspecified ear: Secondary | ICD-10-CM

## 2017-01-16 NOTE — Patient Instructions (Addendum)
It you want to try some epley maneuvers at home that is fine- let me know if you need a PT referral   Please let me know if you do not continue to improve Continue meclizine as needed but remember it can make you sleepy

## 2017-01-16 NOTE — Progress Notes (Signed)
[place] at [facility] [address] [place], [place] [address] [phone]  Date:  01/16/2017   Name:  Theresa Mejia   DOB:  14-Feb-1940   MRN:  [id number]  PCP:  Darreld Mclean, MD    Chief Complaint: No chief complaint on file.   History of Present Illness:  Theresa Mejia is a 77 y.o. very pleasant female patient who presents with the following:  A couple of weeks ago she noted onset of dizziness- she was working in [employer] and was moving her head and eyes around a lot- she thought this might be the cause The dizziness she experienced was vertigo- not lightheadedness She took a meclizine and it did help- she was able to watch tv, read, etc.  It has not been as bad as it was when she first noticed it, but it is not gone yet   She has had vertigo in the past but it never lasted this long. She did see Dr. Raye Sorrow- ENT- years ago for this issue  This morning she went to the gym for the first time in 2 weeks- just stretching.   She is generally ok if she holds her head still No unusual headaches  She has noted some sinus pressure but not consistent pressure, pain or discharge No fever or chills No CP or SOB No hearing change- she does wear hearing aids.    Saw saw cardiology just recently they made a small change to her BP regimen due to orthostatic hypotension  She did have zostavax years ago   BP Readings from Last 3 Encounters:  01/16/17 (!) 148/86  01/06/17 (!) 145/89  12/06/16 114/80      Patient Active Problem List   Diagnosis Date Noted  . Orthostatic hypotension 12/06/2016  . Viral URI with cough 04/20/2016  . Sinus node dysfunction ([place]) 11/11/2015  . Routine general medical examination at a health care facility 06/04/2015  . Insomnia with sleep apnea 06/04/2015  . Bronchitis, chronic obstructive w acute bronchitis ([place]) 12/10/2014  . Lumbar disc disease 09/01/2010  . Obstructive sleep apnea  05/31/2007  . Hyperlipidemia with target LDL less than 130 02/05/2007  . Depression with anxiety 02/05/2007  . Allergic rhinitis 02/05/2007  . Hypothyroidism 02/02/2007  . Essential hypertension 02/02/2007    Past Medical History:  Diagnosis Date  . Allergic rhinitis   . Depression   . DJD (degenerative joint disease)   . GERD (gastroesophageal reflux disease)   . Headache(784.0)   . Hypercholesteremia   . Hypersomnia   . Hypertension   . Hypothyroidism   . Internal hemorrhoid 11/2008   s/p banding ([place])  . Lumbar disc disease   . OBSTRUCTIVE SLEEP APNEA    NPSG 04/29/07- AHI 1.1/hr, RDI 21.2/hr. Weight 176 lbs. CPAP was tried based on the RDI.    [name] OVERACTIVE BLADDER   . Presence of permanent cardiac pacemaker 11/11/2015  . Reactive depression (situational)   . Sinus node dysfunction (HCC)   . Spondylosis, cervical     Past Surgical History:  Procedure Laterality Date  . BIOPSY BREAST    . EP IMPLANTABLE DEVICE N/A 11/11/2015   Procedure: Pacemaker Implant;  Surgeon: Evans Lance, MD;  Location: [facility];  Service: Cardiovascular;  Laterality: N/A;  . eye lid lift    . INSERT / REPLACE / REMOVE PACEMAKER  11/11/2015  . KNEE ARTHROSCOPY     Dr.  Alusio  . rotator cuff surgery     Dr. [name]  . TUBAL LIGATION      Social History   Tobacco Use  . Smoking status: Former Smoker    Last attempt to quit: 01/11/1968    Years since quitting: 49.0  . Smokeless tobacco: Never Used  Substance Use Topics  . Alcohol use: No  . Drug use: No    Family History  Problem Relation Age of Onset  . Crohn's disease Father 49  . Macular degeneration Mother   . Cancer Mother 36       bladder  . Hypothyroidism Mother   . Tremor Mother   . Stroke Mother 78  . Parkinsonism Maternal Grandmother     Allergies  Allergen Reactions  . Ramipril     REACTION: pt states INTOLERANT to Altace  . Sulfa Antibiotics     Says it dropped her blood pressure really low.   .  Sulfonamide Derivatives     REACTION: hypotension    Medication list has been reviewed and updated.  Current Outpatient Medications on File Prior to Visit  Medication Sig Dispense Refill  . ALPRAZolam (XANAX) 0.25 MG tablet Take 0.5 tablets (0.125 mg total) by mouth at bedtime as needed for anxiety or sleep. 30 tablet 1  . Cholecalciferol (VITAMIN D3) 2000 UNITS TABS Take 1 capsule by mouth daily.     . Cyanocobalamin (B-12) 5000 MCG CAPS Take 5,000 mcg by mouth daily.    [name] FLUoxetine (PROZAC) 40 MG capsule Take 1 capsule (40 mg total) by mouth daily. 90 capsule 1  . fluticasone (FLONASE) 50 MCG/ACT nasal spray Place 2 sprays into both nostrils daily. 16 g 5  . levothyroxine (SYNTHROID, LEVOTHROID) 125 MCG tablet Take 62.5 mcg by mouth daily before breakfast.     . telmisartan (MICARDIS) 20 MG tablet Take 1 tablet (20 mg total) by mouth 2 (two) times daily. 180 tablet 3   No current facility-administered medications on file prior to visit.     Review of Systems:  As per HPI- otherwise negative.   Physical Examination: Vitals:   01/16/17 1212  BP: (!) 148/86  Pulse: 96  Resp: 16  Temp: 98.2 F (36.8 C)  SpO2: 97%   Vitals:   01/16/17 1212  Weight: 153 lb (69.4 kg)  Height: 5' (1.524 m)   Body mass index is 29.88 kg/m. Ideal Body Weight: Weight in (lb) to have BMI = 25: 127.7  GEN: WDWN, NAD, Non-toxic, A & O x 3, looks well and younger than age 46: Atraumatic, Normocephalic. Neck supple. No masses, No LAD.  Bilateral TM wnl, oropharynx normal.  PEERL,EOMI.   Ears and Nose: No external deformity. CV: RRR, No M/G/R. No JVD. No thrill. No extra heart sounds. PULM: CTA B, no wheezes, crackles, rhonchi. No retractions. No resp. distress. No accessory muscle use. EXTR: No c/c/e NEURO Normal gait.  PSYCH: Normally interactive. Conversant. Not depressed or anxious appearing.  Calm demeanor.  Normal strength, sensation and DTR of all extremities Negative romberg Negative  dix- halpike except pt did notice vertigo upon sitting back up after testing    Assessment and Plan: Benign paroxysmal positional vertigo, unspecified laterality  Likely BPPV_ she is feeling much better at this time.  Discussed PT- she would rather try and do epley maneuvers on her own. She did these with an ENT in the past and would like to watch a youtube video and try it.  She will let me know if  not helpful or if she wants to see PT after all   Signed Lamar Blinks, MD

## 2017-01-27 ENCOUNTER — Encounter: Payer: Self-pay | Admitting: Family Medicine

## 2017-01-27 DIAGNOSIS — F418 Other specified anxiety disorders: Secondary | ICD-10-CM

## 2017-01-27 MED ORDER — FLUOXETINE HCL 40 MG PO CAPS: 40.0000 mg | ORAL_CAPSULE | Freq: Every day | ORAL | 1 refills | Status: DC

## 2017-02-06 DIAGNOSIS — L249 Irritant contact dermatitis, unspecified cause: Secondary | ICD-10-CM | POA: Diagnosis not present

## 2017-02-07 DIAGNOSIS — R197 Diarrhea, unspecified: Secondary | ICD-10-CM | POA: Diagnosis not present

## 2017-02-07 DIAGNOSIS — K219 Gastro-esophageal reflux disease without esophagitis: Secondary | ICD-10-CM | POA: Diagnosis not present

## 2017-02-11 ENCOUNTER — Other Ambulatory Visit: Payer: Self-pay | Admitting: Internal Medicine

## 2017-02-15 ENCOUNTER — Ambulatory Visit (INDEPENDENT_AMBULATORY_CARE_PROVIDER_SITE_OTHER): Payer: PPO | Admitting: *Deleted

## 2017-02-15 DIAGNOSIS — I495 Sick sinus syndrome: Secondary | ICD-10-CM

## 2017-02-15 NOTE — Progress Notes (Signed)
Remote pacemaker transmission.   

## 2017-02-16 ENCOUNTER — Encounter: Payer: Self-pay | Admitting: Cardiology

## 2017-02-22 ENCOUNTER — Encounter: Payer: Self-pay | Admitting: Family Medicine

## 2017-03-07 ENCOUNTER — Encounter: Payer: Self-pay | Admitting: Family Medicine

## 2017-03-07 NOTE — Telephone Encounter (Signed)
I spoke with the patient, BP has been elevated for a while, usually the diastolic BP is in the 61Y. Other than occasional headaches she feels well and goes to the gym regularly. I agree with see her tomorrow, I do not see any urgent matters however if she has severe headache tonight or chest pain knows to go to the ER.  JP

## 2017-03-07 NOTE — Telephone Encounter (Signed)
Just an Juluis Rainier- has an appt w/ Dr. Larose Kells on 03/08/2017 at 0900.

## 2017-03-08 ENCOUNTER — Ambulatory Visit (INDEPENDENT_AMBULATORY_CARE_PROVIDER_SITE_OTHER): Payer: PPO | Admitting: Internal Medicine

## 2017-03-08 VITALS — BP 169/90 | HR 75 | Resp 16 | Ht 60.0 in | Wt 152.6 lb

## 2017-03-08 DIAGNOSIS — I10 Essential (primary) hypertension: Secondary | ICD-10-CM

## 2017-03-08 MED ORDER — TELMISARTAN 80 MG PO TABS: 80.0000 mg | ORAL_TABLET | Freq: Every day | ORAL | 1 refills | Status: DC

## 2017-03-08 MED ORDER — AMLODIPINE BESYLATE 5 MG PO TABS: 5.0000 mg | ORAL_TABLET | Freq: Every day | ORAL | 1 refills | Status: DC

## 2017-03-08 NOTE — Progress Notes (Signed)
Subjective:    Patient ID: Theresa Mejia, female    DOB: [birth year], 77 y.o.   MRN: [id number]  DOS:  03/08/2017 Type of visit - description : Acute, concerned about BP Interval history: A couple weeks ago, micardis was  increased from 40 to 60 mg Patient continue checking her BPs and is obtaining elevated readings: 147/105, pulse 107 180/15 176/112 129/99. Also concerned about fatigue, chronic issue, change thyroid medicine?.    BP Readings from Last 3 Encounters:  03/08/17 (!) 169/90  01/16/17 (!) 148/86  01/06/17 (!) 145/89     Review of Systems Reports she exercises regularly.  Admits room for improvement on her diet. Denies chest pain no shortness of breath No headache No edema  Past Medical History:  Diagnosis Date  . Allergic rhinitis   . Depression   . DJD (degenerative joint disease)   . GERD (gastroesophageal reflux disease)   . Headache(784.0)   . Hypercholesteremia   . Hypersomnia   . Hypertension   . Hypothyroidism   . Internal hemorrhoid 11/2008   s/p banding ([place])  . Lumbar disc disease   . OBSTRUCTIVE SLEEP APNEA    NPSG 04/29/07- AHI 1.1/hr, RDI 21.2/hr. Weight 176 lbs. CPAP was tried based on the RDI.    [name] OVERACTIVE BLADDER   . Presence of permanent cardiac pacemaker 11/11/2015  . Reactive depression (situational)   . Sinus node dysfunction (HCC)   . Spondylosis, cervical     Past Surgical History:  Procedure Laterality Date  . BIOPSY BREAST    . EP IMPLANTABLE DEVICE N/A 11/11/2015   Procedure: Pacemaker Implant;  Surgeon: Evans Lance, MD;  Location: [facility];  Service: Cardiovascular;  Laterality: N/A;  . eye lid lift    . INSERT / REPLACE / REMOVE PACEMAKER  11/11/2015  . KNEE ARTHROSCOPY     Dr. [name]  . rotator cuff surgery     Dr. [name]  . TUBAL LIGATION      Social History   Socioeconomic History  . Marital status: Married    Spouse name: Not on file  . Number of children: 2  . Years of education: Not  on file  . Highest education level: Not on file  Social Needs  . Financial resource strain: Not on file  . Food insecurity - worry: Not on file  . Food insecurity - inability: Not on file  . Transportation needs - medical: Not on file  . Transportation needs - non-medical: Not on file  Occupational History  . 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 Smoking status: Former Smoker    Last attempt to quit: 01/11/1968    Years since quitting: 49.1  . Smokeless tobacco: Never Used  Substance and Sexual Activity  . Alcohol use: No  . Drug use: No  . Sexual activity: No  Other Topics Concern  . Not on file  Social History Narrative   married, lives with spouse -   Retired 05/2013      Allergies as of 03/08/2017      Reactions   Ramipril    REACTION: pt states INTOLERANT to Altace   Sulfa Antibiotics    Says it dropped her blood pressure really low.    Sulfonamide Derivatives    REACTION: hypotension      Medication List        Accurate as of 03/08/17  6:48 PM. Always  use your most recent med list.          ALPRAZolam 0.25 MG tablet Commonly known as:  XANAX Take 0.5 tablets (0.125 mg total) by mouth at bedtime as needed for anxiety or sleep.   amLODipine 5 MG tablet Commonly known as:  NORVASC Take 1 tablet (5 mg total) by mouth daily.   B-12 5000 MCG Caps Take 5,000 mcg by mouth daily.   FLUoxetine 40 MG capsule Commonly known as:  PROZAC Take 1 capsule (40 mg total) by mouth daily.   fluticasone 50 MCG/ACT nasal spray Commonly known as:  FLONASE Place 2 sprays into both nostrils daily.   levothyroxine 125 MCG tablet Commonly known as:  SYNTHROID, LEVOTHROID Take 62.5 mcg by mouth daily before breakfast.   telmisartan 80 MG tablet Commonly known as:  MICARDIS Take 1 tablet (80 mg total) by mouth daily.   Vitamin D3 5000 units Caps Take 1 capsule by mouth daily.          Objective:    Physical Exam BP (!) 169/90 (BP Location: Left Arm, Patient Position: Sitting, Cuff Size: Normal)   Pulse 75   Resp 16   Ht 5' (1.524 m)   Wt 152 lb 9.6 oz (69.2 kg)   SpO2 99%   BMI 29.80 kg/m   General:   Well developed, well nourished . NAD.  HEENT:  Normocephalic . Face symmetric, atraumatic Lungs:  CTA B Normal respiratory effort, no intercostal retractions, no accessory muscle use. Heart: RRR,  no murmur.  No pretibial edema bilaterally  Skin: Not pale. Not jaundice Neurologic:  alert & oriented X3.  Speech normal, gait appropriate for age and unassisted Psych--  Cognition and judgment appear intact.  Cooperative with normal attention span and concentration.  Behavior appropriate. No anxious or depressed appearing.      Assessment & Plan:   77 y/o female w/ problems that include hypertension, OSA -mild , no Cpap-, anxiety depression, sinus node dysfx w/ pacemaker hypothyroidism, presents with  HTN Needs better control, her home BP monitor is not accurate ( readingwas almost 20 points lower than our monitor upon arrival to the office  however  BP recheck: 158/92 with our monitor; 166/107 with her monitor) Plan: Get a new BP monitor Increase Micardis to 80 mg; start  amlodipine, used to take 10 mg years ago, will Rx  5 mg daily for now Watch salt intake Monitor on monitor BPs daily Hypothyroidism: Patient wonders about switching to thyroid [place], recommend to discuss with PCP RTC's PCP in 2 weeks

## 2017-03-08 NOTE — Progress Notes (Signed)
Pre visit review using our clinic review tool, if applicable. No additional management support is needed unless otherwise documented below in the visit note. 

## 2017-03-08 NOTE — Patient Instructions (Addendum)
GO TO THE FRONT DESK Schedule your next appointment for a checkup with your primary doctor in 2 weeks  Take medications as prescribed  Low-salt diet   Get a new BP monitor  Check the  blood pressure   daily Be sure your blood pressure is between 110/65 and  135/85. If it is consistently higher or lower, let me know

## 2017-03-20 LAB — CUP PACEART REMOTE DEVICE CHECK
Implantable Lead Implant Date: 20171101
Implantable Lead Implant Date: 20171101
Implantable Lead Location: 753859
Implantable Lead Location: 753860
Implantable Lead Model: 377
Implantable Lead Model: 377
Implantable Lead Serial Number: 49617393
Implantable Pulse Generator Implant Date: 20171101
MDC IDC LEAD SERIAL: 49553678
MDC IDC SESS DTM: 20190311194606
Pulse Gen Model: 394969
Pulse Gen Serial Number: 68817609

## 2017-03-22 ENCOUNTER — Encounter: Payer: Self-pay | Admitting: Family Medicine

## 2017-03-22 ENCOUNTER — Ambulatory Visit (INDEPENDENT_AMBULATORY_CARE_PROVIDER_SITE_OTHER): Payer: PPO | Admitting: Family Medicine

## 2017-03-22 VITALS — BP 138/84 | HR 91 | Temp 98.4°F | Resp 16 | Ht 59.0 in | Wt 154.4 lb

## 2017-03-22 DIAGNOSIS — E039 Hypothyroidism, unspecified: Secondary | ICD-10-CM | POA: Diagnosis not present

## 2017-03-22 DIAGNOSIS — I10 Essential (primary) hypertension: Secondary | ICD-10-CM | POA: Diagnosis not present

## 2017-03-22 DIAGNOSIS — F5101 Primary insomnia: Secondary | ICD-10-CM

## 2017-03-22 MED ORDER — CLONAZEPAM 0.5 MG PO TABS: ORAL_TABLET | ORAL | 0 refills | Status: DC

## 2017-03-22 NOTE — Patient Instructions (Addendum)
Please increase your prozac to 50 mg daily for a week, then go to 60 mg.  Let me know how this works for you  For sleep, let's have you try a very small dose of klonopin at bedtime Take this INSTEAD of alprazolam., do not combine with alcohol  Please see me in a month to check on how you are doing   Please call : [phone] to schedule a visit with Marya Amsler.  Karna Christmas also comes to this location and is also very good

## 2017-03-22 NOTE — Progress Notes (Signed)
[place] at [facility] [address], [place], [place] [address] [phone]  Date:  03/22/2017   Name:  Theresa Mejia   DOB:  06-26-1940   MRN:  [id number]  PCP:  Theresa Mclean, MD    Chief Complaint: No chief complaint on file.   History of Present Illness:  Theresa Mejia is a 77 y.o. very pleasant female patient who presents with the following:  Here today for a BP follow-up- saw Dr. Larose Mejia on 2/27- he increased her micardis and added amlodipine, and she has responded quite well   Needs better control, her home BP monitor is not accurate ( readingwas almost 20 points lower than our monitor upon arrival to the office  however  BP recheck: 158/92 with our monitor; 166/107 with her monitor) Plan: Get a new BP monitor Increase Micardis to 80 mg; start  amlodipine, used to take 10 mg years ago, will Rx  5 mg daily for now Watch salt intake Monitor on monitor BPs daily  Lab Results  Component Value Date   TSH 1.08 07/14/2016   BP Readings from Last 3 Encounters:  03/22/17 138/84  03/08/17 (!) 169/90  01/16/17 (!) 148/86   She had been concerned that her BP was running too high for 4-6 weeks prior to her last visit BP today looks much better, she is tolerating medication change well However, she also needs to discuss some mood sx today  She has noted depression, anxiety, and does not feel like herself- this has been a concern for her for about the last 4 years, as belwo She thinks that her mood is effecting he BP 4 years ago her husband Theresa Mejia had a ruptured AAA, and her mom died.  Her husband did survive but "everything went upside down then".  She retired from her job, and has focused on helping her husband. He also then got prostate cancer  She notes that she feels quite anxious, and is also often tearful.  She feels overwhelmed by life, but denies any SI She is on prozac 40- she has tried to stop this in the past and  tapered down her dose, but then went back on 40 mg.  However this is not controlling her sx.  She is not sleeping well- will wake up many times during the night and not be able to get back to sleep.  She feels exhausted and this is not helping her mood and energy level  She has used xanax in the past for sleep- however, her main thing is trying to stay asleep (she falls asleep ok) so this is not as helpful for her  She saw Theresa Mejia in the past, but might like to see a woman this time.  She would like to see Theresa Mejia if she can  No fever or chills  Patient Active Problem List   Diagnosis Date Noted  . Orthostatic hypotension 12/06/2016  . Viral URI with cough 04/20/2016  . Sinus node dysfunction ([place]) 11/11/2015  . Routine general medical examination at a health care facility 06/04/2015  . Insomnia with sleep apnea 06/04/2015  . Bronchitis, chronic obstructive w acute bronchitis ([place]) 12/10/2014  . Lumbar disc disease 09/01/2010  . Obstructive sleep apnea 05/31/2007  . Hyperlipidemia with target LDL less than 130 02/05/2007  . Depression with anxiety 02/05/2007  . Allergic rhinitis 02/05/2007  . Hypothyroidism 02/02/2007  . Essential hypertension 02/02/2007    Past Medical  History:  Diagnosis Date  . Allergic rhinitis   . Depression   . DJD (degenerative joint disease)   . GERD (gastroesophageal reflux disease)   . Headache(784.0)   . Hypercholesteremia   . Hypersomnia   . Hypertension   . Hypothyroidism   . Internal hemorrhoid 11/2008   s/p banding ([place])  . Lumbar disc disease   . OBSTRUCTIVE SLEEP APNEA    NPSG 04/29/07- AHI 1.1/hr, RDI 21.2/hr. Weight 176 lbs. CPAP was tried based on the RDI.    [name] OVERACTIVE BLADDER   . Presence of permanent cardiac pacemaker 11/11/2015  . Reactive depression (situational)   . Sinus node dysfunction (HCC)   . Spondylosis, cervical     Past Surgical History:  Procedure Laterality Date  . BIOPSY BREAST    . EP IMPLANTABLE  DEVICE N/A 11/11/2015   Procedure: Pacemaker Implant;  Surgeon: Evans Lance, MD;  Location: [facility];  Service: Cardiovascular;  Laterality: N/A;  . eye lid lift    . INSERT / REPLACE / REMOVE PACEMAKER  11/11/2015  . KNEE ARTHROSCOPY     Dr. [name]  . rotator cuff surgery     Dr. [name]  . TUBAL LIGATION      Social History   Tobacco Use  . Smoking status: Former Smoker    Last attempt to quit: 01/11/1968    Years since quitting: 49.2  . Smokeless tobacco: Never Used  Substance Use Topics  . Alcohol use: No  . Drug use: No    Family History  Problem Relation Age of Onset  . Crohn's disease Father 41  . Macular degeneration Mother   . Cancer Mother 66       bladder  . Hypothyroidism Mother   . Tremor Mother   . Stroke Mother 70  . Parkinsonism Maternal Grandmother     Allergies  Allergen Reactions  . Ramipril     REACTION: pt states INTOLERANT to Altace  . Sulfa Antibiotics     Says it dropped her blood pressure really low.   . Sulfonamide Derivatives     REACTION: hypotension    Medication list has been reviewed and updated.  Current Outpatient Medications on File Prior to Visit  Medication Sig Dispense Refill  . ALPRAZolam (XANAX) 0.25 MG tablet Take 0.5 tablets (0.125 mg total) by mouth at bedtime as needed for anxiety or sleep. 30 tablet 1  . amLODipine (NORVASC) 5 MG tablet Take 1 tablet (5 mg total) by mouth daily. 30 tablet 1  . Cholecalciferol (VITAMIN D3) 5000 units CAPS Take 1 capsule by mouth daily.     [name] FLUoxetine (PROZAC) 40 MG capsule Take 1 capsule (40 mg total) by mouth daily. 90 capsule 1  . fluticasone (FLONASE) 50 MCG/ACT nasal spray Place 2 sprays into both nostrils daily. 16 g 5  . levothyroxine (SYNTHROID, LEVOTHROID) 125 MCG tablet Take 62.5 mcg by mouth daily before breakfast.     . telmisartan (MICARDIS) 80 MG tablet Take 1 tablet (80 mg total) by mouth daily. 30 tablet 1  . Cyanocobalamin (B-12) 5000 MCG CAPS Take 5,000 mcg  by mouth daily.     No current facility-administered medications on file prior to visit.     Review of Systems:  As per HPI- otherwise negative. Wt Readings from Last 3 Encounters:  03/22/17 154 lb 6.4 oz (70 kg)  03/08/17 152 lb 9.6 oz (69.2 kg)  01/16/17 153 lb (69.4 kg)      Physical Examination:  Vitals:   03/22/17 1517  BP: 138/84  Pulse: 91  Resp: 16  Temp: 98.4 F (36.9 C)  SpO2: 98%   Vitals:   03/22/17 1517  Weight: 154 lb 6.4 oz (70 kg)  Height: 4\' 11"  (1.499 m)   Body mass index is 31.19 kg/m. Ideal Body Weight: Weight in (lb) to have BMI = 25: 123.5  GEN: WDWN, NAD, Non-toxic, A & O x 3, overweight, looks well, a bit tearful today  HEENT: Atraumatic, Normocephalic. Neck supple. No masses, No LAD. Ears and Nose: No external deformity. CV: RRR, No M/G/R. No JVD. No thrill. No extra heart sounds. PULM: CTA B, no wheezes, crackles, rhonchi. No retractions. No resp. distress. No accessory muscle use. ABD: S, NT, ND EXTR: No c/c/e NEURO Normal gait.  PSYCH: Normally interactive. Conversant. Not depressed or anxious appearing.  Calm demeanor.    Assessment and Plan: Essential hypertension  Hypothyroidism (acquired)  Primary insomnia - Plan: clonazePAM (KLONOPIN) 0.5 MG tablet  following up on her BP today- she is doing fine with current regimen and will continue However she also admits that she is struggling with depression and insomnia, anxiety  Will have her taper up to 60 mg of prozac Rx for klonopin 0.5- discussed how to use safely She plans to start seeing a counselor ?terri or Theresa Mejia She will see me in one month for a recheck Sooner if worse.    Signed Lamar Blinks, MD

## 2017-04-21 ENCOUNTER — Other Ambulatory Visit: Payer: Self-pay | Admitting: Family Medicine

## 2017-04-21 DIAGNOSIS — Z1231 Encounter for screening mammogram for malignant neoplasm of breast: Secondary | ICD-10-CM

## 2017-05-02 ENCOUNTER — Ambulatory Visit (INDEPENDENT_AMBULATORY_CARE_PROVIDER_SITE_OTHER): Payer: PPO | Admitting: Psychology

## 2017-05-02 DIAGNOSIS — F4321 Adjustment disorder with depressed mood: Secondary | ICD-10-CM | POA: Diagnosis not present

## 2017-05-05 ENCOUNTER — Other Ambulatory Visit: Payer: Self-pay | Admitting: Family Medicine

## 2017-05-14 ENCOUNTER — Other Ambulatory Visit: Payer: Self-pay | Admitting: Internal Medicine

## 2017-05-17 ENCOUNTER — Ambulatory Visit (INDEPENDENT_AMBULATORY_CARE_PROVIDER_SITE_OTHER): Payer: PPO | Admitting: *Deleted

## 2017-05-17 DIAGNOSIS — I495 Sick sinus syndrome: Secondary | ICD-10-CM

## 2017-05-17 NOTE — Progress Notes (Signed)
Remote pacemaker transmission.   

## 2017-05-18 ENCOUNTER — Ambulatory Visit
Admission: RE | Admit: 2017-05-18 | Discharge: 2017-05-18 | Disposition: A | Discharge status: Home / Self Care | Payer: PPO | Source: Ambulatory Visit | Attending: Family Medicine | Admitting: Family Medicine

## 2017-05-18 ENCOUNTER — Encounter: Payer: Self-pay | Admitting: Cardiology

## 2017-05-18 DIAGNOSIS — Z1231 Encounter for screening mammogram for malignant neoplasm of breast: Secondary | ICD-10-CM

## 2017-05-19 ENCOUNTER — Ambulatory Visit (INDEPENDENT_AMBULATORY_CARE_PROVIDER_SITE_OTHER): Payer: PPO | Admitting: Internal Medicine

## 2017-05-19 ENCOUNTER — Encounter: Payer: Self-pay | Admitting: Internal Medicine

## 2017-05-19 VITALS — BP 122/78 | HR 78 | Temp 98.0°F | Resp 14 | Ht 59.0 in | Wt 153.1 lb

## 2017-05-19 DIAGNOSIS — N390 Urinary tract infection, site not specified: Secondary | ICD-10-CM

## 2017-05-19 DIAGNOSIS — R399 Unspecified symptoms and signs involving the genitourinary system: Secondary | ICD-10-CM | POA: Diagnosis not present

## 2017-05-19 LAB — POC URINALSYSI DIPSTICK (AUTOMATED)
BILIRUBIN UA: NEGATIVE
Glucose, UA: NEGATIVE
Ketones, UA: NEGATIVE
Leukocytes, UA: NEGATIVE
Nitrite, UA: NEGATIVE
PH UA: 6 (ref 5.0–8.0)
Spec Grav, UA: 1.025 (ref 1.010–1.025)
Urobilinogen, UA: 0.2 E.U./dL

## 2017-05-19 MED ORDER — CIPROFLOXACIN HCL 500 MG PO TABS: 500.0000 mg | ORAL_TABLET | Freq: Two times a day (BID) | ORAL | 0 refills | Status: DC

## 2017-05-19 NOTE — Progress Notes (Signed)
Pre visit review using our clinic review tool, if applicable. No additional management support is needed unless otherwise documented below in the visit note. 

## 2017-05-19 NOTE — Patient Instructions (Signed)
Take Cipro 500 mg 1 tablet twice a day for 5 days  Drink plenty fluids  Call or go to the ER or urgent care if: You are not gradually better, you have severe symptoms, fever, chills, stomach or pain on the sides.

## 2017-05-19 NOTE — Progress Notes (Signed)
Subjective:    Patient ID: Theresa Mejia, female    DOB: 1940/11/25, 77 y.o.   MRN: [id number]  DOS:  05/19/2017 Type of visit - description : Acute visit Interval history: Symptoms started today with vaginal and rectal pressure. Shortly after she developed urinary frequency and dysuria. Interestingly she also had several bowel movements (6), she noted a small amount of stools each BM but they looked normal, no diarrhea No history of previous UTI   Review of Systems Mildly decreased appetite today Denies fever chills Mild nausea no vomiting No vaginal bleeding or discharge No abdominal pain or flank pain per se but she does have some low back pain.  Past Medical History:  Diagnosis Date  . Allergic rhinitis   . Depression   . DJD (degenerative joint disease)   . GERD (gastroesophageal reflux disease)   . Headache(784.0)   . Hypercholesteremia   . Hypersomnia   . Hypertension   . Hypothyroidism   . Internal hemorrhoid 11/2008   s/p banding ([facility])  . Lumbar disc disease   . OBSTRUCTIVE SLEEP APNEA    NPSG 04/29/07- AHI 1.1/hr, RDI 21.2/hr. Weight 176 lbs. CPAP was tried based on the RDI.    [name] OVERACTIVE BLADDER   . Presence of permanent cardiac pacemaker 11/11/2015  . Reactive depression (situational)   . Sinus node dysfunction (HCC)   . Spondylosis, cervical     Past Surgical History:  Procedure Laterality Date  . BIOPSY BREAST    . BREAST EXCISIONAL BIOPSY Left 2011  . EP IMPLANTABLE DEVICE N/A 11/11/2015   Procedure: Pacemaker Implant;  Surgeon: Evans Lance, MD;  Location: [facility];  Service: Cardiovascular;  Laterality: N/A;  . eye lid lift    . INSERT / REPLACE / REMOVE PACEMAKER  11/11/2015  . KNEE ARTHROSCOPY     Dr. [name]  . rotator cuff surgery     Dr. [name]  . TUBAL LIGATION      Social History   Socioeconomic History  . Marital status: Married    Spouse name: Not on file  . Number of children: 2  . Years of education: Not on  file  . Highest education level: Not on file  Occupational History  . 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 Financial resource strain: Not on file  . Food insecurity:    Worry: Not on file    Inability: Not on file  . Transportation needs:    Medical: Not on file    Non-medical: Not on file  Tobacco Use  . Smoking status: Former Smoker    Last attempt to quit: 01/11/1968    Years since quitting: 49.3  . Smokeless tobacco: Never Used  Substance and Sexual Activity  . Alcohol use: No  . Drug use: No  . Sexual activity: Never  Lifestyle  . Physical activity:    Days per week: Not on file    Minutes per session: Not on file  . Stress: Not on file  Relationships  . Social connections:    Talks on phone: Not on file    Gets together: Not on file    Attends religious service: Not on file    Active member of club or organization: Not on file    Attends meetings of clubs or organizations: Not on file    Relationship status: Not on file  . Intimate partner violence:  Fear of current or ex partner: Not on file    Emotionally abused: Not on file    Physically abused: Not on file    Forced sexual activity: Not on file  Other Topics Concern  . Not on file  Social History Narrative   married, lives with spouse -   Retired 05/2013      Allergies as of 05/19/2017      Reactions   Ramipril    REACTION: pt states INTOLERANT to Altace   Sulfa Antibiotics    Says it dropped her blood pressure really low.    Sulfonamide Derivatives    REACTION: hypotension      Medication List        Accurate as of 05/19/17 11:59 PM. Always use your most recent med list.          amLODipine 5 MG tablet Commonly known as:  NORVASC Take 1 tablet (5 mg total) by mouth daily.   B-12 5000 MCG Caps Take 5,000 mcg by mouth daily.   ciprofloxacin 500 MG tablet Commonly known as:  CIPRO Take 1 tablet (500 mg total) by mouth 2  (two) times daily.   clonazePAM 0.5 MG tablet Commonly known as:  KLONOPIN Take 1/2 at bedtime as needed for insomnia, may increase to a whole tablet if needed   FLUoxetine 40 MG capsule Commonly known as:  PROZAC Take 1 capsule (40 mg total) by mouth daily.   fluticasone 50 MCG/ACT nasal spray Commonly known as:  FLONASE Place 2 sprays into both nostrils daily.   levothyroxine 125 MCG tablet Commonly known as:  SYNTHROID, LEVOTHROID TAKE 1 TABLET EVERY DAY   telmisartan 80 MG tablet Commonly known as:  MICARDIS Take 1 tablet (80 mg total) by mouth daily.   Vitamin D3 5000 units Caps Take 1 capsule by mouth daily.          Objective:   Physical Exam  Abdominal:     BP 122/78 (BP Location: Left Arm, Patient Position: Sitting, Cuff Size: Small)   Pulse 78   Temp 98 F (36.7 C) (Oral)   Resp 14   Ht 4\' 11"  (1.499 m)   Wt 153 lb 2 oz (69.5 kg)   SpO2 98%   BMI 30.93 kg/m  General:   Well developed, well nourished . NAD.  HEENT:  Normocephalic . Face symmetric, atraumatic Lungs:  CTA B Normal respiratory effort, no intercostal retractions, no accessory muscle use. Heart: RRR,  no murmur.  no pretibial edema bilaterally  Abdomen:  Not distended, soft, mild tenderness without mass or rebound.  See graphic.  Specifically no McBurney point pain. No CVA tenderness Skin: Not pale. Not jaundice Neurologic:  alert & oriented X3.  Speech normal, gait appropriate for age and unassisted Psych--  Cognition and judgment appear intact.  Cooperative with normal attention span and concentration.  Behavior appropriate. No anxious or depressed appearing.     Assessment & Plan:     77 y/o female w/ problems that include hypertension, OSA -mild , no Cpap-, anxiety depression, sinus node dysfx w/ pacemaker hypothyroidism, presents with  UTI: Udip + blood Somewhat atypical presentation (rectal pressure, several BMs)  but still suspect UTI or early pyelonephritis on  clinical grounds and  Udip   Plan: UA, urine culture, Cipro, fluids. Knows to call or go to the ER if fever, chills, or if she develops abdominal pain.

## 2017-05-20 LAB — URINALYSIS, ROUTINE W REFLEX MICROSCOPIC
BILIRUBIN URINE: NEGATIVE
Bacteria, UA: NONE SEEN /HPF
Glucose, UA: NEGATIVE
Hyaline Cast: NONE SEEN /LPF
Leukocytes, UA: NEGATIVE
Nitrite: NEGATIVE
SQUAMOUS EPITHELIAL / LPF: NONE SEEN /HPF (ref <=5)
Specific Gravity, Urine: 1.021 (ref 1.001–1.03)
WBC, UA: NONE SEEN /HPF (ref 0–5)
pH: 5.5 (ref 5.0–8.0)

## 2017-05-20 LAB — URINE CULTURE
MICRO NUMBER:: 90573313
RESULT: NO GROWTH
SPECIMEN QUALITY:: ADEQUATE

## 2017-06-01 ENCOUNTER — Encounter: Payer: Self-pay | Admitting: Family Medicine

## 2017-06-02 ENCOUNTER — Ambulatory Visit: Payer: Self-pay | Admitting: *Deleted

## 2017-06-02 ENCOUNTER — Ambulatory Visit (INDEPENDENT_AMBULATORY_CARE_PROVIDER_SITE_OTHER): Payer: PPO | Admitting: Internal Medicine

## 2017-06-02 ENCOUNTER — Encounter: Payer: Self-pay | Admitting: Internal Medicine

## 2017-06-02 VITALS — BP 110/70 | HR 74 | Temp 98.0°F | Resp 16 | Ht 59.0 in | Wt 148.6 lb

## 2017-06-02 DIAGNOSIS — R197 Diarrhea, unspecified: Secondary | ICD-10-CM | POA: Diagnosis not present

## 2017-06-02 NOTE — Telephone Encounter (Signed)
Pt called with complaints of stomach bug for 3 days; she was wondering if she should come in for an appointment; per her emails with Janett Billow Copland, "if you like we are glad to see you in office tomorrow before long weekend. Please make an appt on mychart or call if you need to"; confirmed that she should follow recommendations per D Copland has no availability; pt offered and accepted appointment with Dr Caro Laroche, [facility], today at 1340; she verbalizes understanding; will route to office for notification of this upcoming appointment.

## 2017-06-02 NOTE — Patient Instructions (Signed)
Rest  Drink plenty of fluids and follow-up bland diet  Okay to take Imodium few times for diarrhea  Restart probiotics for few days  Call if severe symptoms, fever, chills, blood in the stools, not getting better in few days.

## 2017-06-02 NOTE — Telephone Encounter (Signed)
Noted, thx.

## 2017-06-02 NOTE — Progress Notes (Signed)
Subjective:    Patient ID: Theresa Mejia, female    DOB: 01/01/1941, 77 y.o.   MRN: [id number]  DOS:  06/02/2017 Type of visit - description : acute  Interval history: Was seen by me 05/19/2017.  At the time he had some unusual symptoms: Vaginal and rectal pressure, urinary frequency and dysuria, was treated empirically with Cipro but eventually urine culture came back negative.   She is here because diarrhea. He came back from [place] 5 days ago, 2 days later he developed several episode of watery diarrhea associated with lower abdominal cramp. Throughout these, she has remained hungry so she is eating but mostly a bland diet and trying to force a lot of fluids on her. She feels somewhat tired but denies orthostatic dizziness. In the last 24 hours, the stools are more solid.  Review of Systems No fever chills No change in the color of the stools or blood. No nausea or vomiting No vaginal discharge  Past Medical History:  Diagnosis Date  . Allergic rhinitis   . Depression   . DJD (degenerative joint disease)   . GERD (gastroesophageal reflux disease)   . Headache(784.0)   . Hypercholesteremia   . Hypersomnia   . Hypertension   . Hypothyroidism   . Internal hemorrhoid 11/2008   s/p banding ([facility])  . Lumbar disc disease   . OBSTRUCTIVE SLEEP APNEA    NPSG 04/29/07- AHI 1.1/hr, RDI 21.2/hr. Weight 176 lbs. CPAP was tried based on the RDI.    [name] OVERACTIVE BLADDER   . Presence of permanent cardiac pacemaker 11/11/2015  . Reactive depression (situational)   . Sinus node dysfunction (HCC)   . Spondylosis, cervical     Past Surgical History:  Procedure Laterality Date  . BIOPSY BREAST    . BREAST EXCISIONAL BIOPSY Left 2011  . EP IMPLANTABLE DEVICE N/A 11/11/2015   Procedure: Pacemaker Implant;  Surgeon: Evans Lance, MD;  Location: [place] [facility];  Service: Cardiovascular;  Laterality: N/A;  . eye lid lift    . INSERT / REPLACE / REMOVE PACEMAKER  11/11/2015    . KNEE ARTHROSCOPY     Dr. [name]  . rotator cuff surgery     Dr. [name]  . TUBAL LIGATION      Social History   Socioeconomic History  . Marital status: Married    Spouse name: Not on file  . Number of children: 2  . Years of education: Not on file  . Highest education level: Not on file  Occupational History  . 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 Financial resource strain: Not on file  . Food insecurity:    Worry: Not on file    Inability: Not on file  . Transportation needs:    Medical: Not on file    Non-medical: Not on file  Tobacco Use  . Smoking status: Former Smoker    Last attempt to quit: 01/11/1968    Years since quitting: 49.4  . Smokeless tobacco: Never Used  Substance and Sexual Activity  . Alcohol use: No  . Drug use: No  . Sexual activity: Never  Lifestyle  . Physical activity:    Days per week: Not on file    Minutes per session: Not on file  . Stress: Not on file  Relationships  . Social connections:    Talks on phone: Not on file    Gets  together: Not on file    Attends religious service: Not on file    Active member of club or organization: Not on file    Attends meetings of clubs or organizations: Not on file    Relationship status: Not on file  . Intimate partner violence:    Fear of current or ex partner: Not on file    Emotionally abused: Not on file    Physically abused: Not on file    Forced sexual activity: Not on file  Other Topics Concern  . Not on file  Social History Narrative   married, lives with spouse -   Retired 05/2013      Allergies as of 06/02/2017      Reactions   Ramipril    REACTION: pt states INTOLERANT to Altace   Sulfa Antibiotics    Says it dropped her blood pressure really low.    Sulfonamide Derivatives    REACTION: hypotension      Medication List        Accurate as of 06/02/17 11:59 PM. Always use your most recent med list.           amLODipine 5 MG tablet Commonly known as:  NORVASC Take 1 tablet (5 mg total) by mouth daily.   B-12 5000 MCG Caps Take 5,000 mcg by mouth daily.   clonazePAM 0.5 MG tablet Commonly known as:  KLONOPIN Take 1/2 at bedtime as needed for insomnia, may increase to a whole tablet if needed   FLUoxetine 40 MG capsule Commonly known as:  PROZAC Take 1 capsule (40 mg total) by mouth daily.   fluticasone 50 MCG/ACT nasal spray Commonly known as:  FLONASE Place 2 sprays into both nostrils daily.   levothyroxine 125 MCG tablet Commonly known as:  SYNTHROID, LEVOTHROID TAKE 1 TABLET EVERY DAY   telmisartan 80 MG tablet Commonly known as:  MICARDIS Take 1 tablet (80 mg total) by mouth daily.   Vitamin D3 5000 units Caps Take 1 capsule by mouth daily.          Objective:   Physical Exam BP 110/70 (BP Location: Left Arm, Patient Position: Sitting, Cuff Size: Normal)   Pulse 74   Temp 98 F (36.7 C) (Oral)   Resp 16   Ht 4\' 11"  (1.499 m)   Wt 148 lb 9.6 oz (67.4 kg)   SpO2 98%   BMI 30.01 kg/m  General:   Well developed, well nourished . NAD.  HEENT:  Normocephalic . Face symmetric, atraumatic.  Oral membranes moist.  Not pale.  Abdomen:  Not distended, soft, non-tender. No rebound or rigidity.  Slight increased bowel sounds Skin: Not pale. Not jaundice Neurologic:  alert & oriented X3.  Speech normal, gait appropriate for age and unassisted Psych--  Cognition and judgment appear intact.  Cooperative with normal attention span and concentration.  Behavior appropriate. No anxious or depressed appearing.     Assessment & Plan:    77 y/o female w/ problems that include hypertension, OSA -mild , no Cpap-, anxiety depression, sinus node dysfx w/ pacemaker hypothyroidism, presents with  Diarrhea: As described above, no red flag symptoms, traveler's diarrhea? Related to recent abx?  For now recommend conservative treatment with fluids, continue with a bland diet,  occasional use of Imodium and restart probiotics.  Warning symptoms to return to the office discussed. She verbalized understanding UTI?  See last visit from 05-19-17, nose with a UTI, took Cipro but eventually urine culture was negative.  Symptoms completely  resolved.

## 2017-06-02 NOTE — Telephone Encounter (Signed)
FYI to Dr. Paz about upcoming appt.  

## 2017-06-06 NOTE — Progress Notes (Addendum)
[place] [facility] [address], [place], [place] [address] [phone]  Date:  06/08/2017   Name:  Theresa Mejia   DOB:  Jan 25, 1940   MRN:  [id number]  PCP:  Darreld Mclean, MD    Chief Complaint: Abdominal Issues (saw Dr. Earlean Shawl because she felt like she needed to have a BM, pressure, gas, using the bathroom several times a day, banding procedure, UTD with colonoscopy); Gastroesophageal Reflux (advised to take omeprasole-did not try); and Diarrhea   History of Present Illness:  Theresa Mejia is a 77 y.o. very pleasant female patient who presents with the following:  Here today with concern of a bowel problem for a couple of years  History of hypothyroidism, HTN, OSA, pacemaker  She notes that she is "just not feeling right" in her digestive system. She notes cramping, reflux, heartburn, and notes a "constant urge to move my bowels."  Even after a BM she will feel like she still needs to go, she is sometimes afraid to leave the house although she is not having fecal incontinence  She is also feeling lightheaded, weak, tired, sleepy-  She notes feeling tired and sleepy for years, "I've told every doctor I've ever gone to."  She also noted that she feels unsteady, and like she might be nauseated if she moved too fast   She had her pacer checked just recently - looked ok  She did have a sleep study done but this was years ago,she was "borderline apnea."  She tried CPAP for about a year but did not stick with it This sleep study may have been 20 years ago   She did see [facility] about her GI issues- they did some banding for hemorrhoids but this did not seem to help with her fecal urgency sx She had a colonoscopy 2 years ago  Dr. Nicole Kindred did recommend omeprazole but so far she has not tried it  Dr. Earlean Shawl is not in her network any longer so she can't really continue to see him- she has heard about Dr. Loletha Carrow with [facility] and  would like a referral to see him to take over her GI care and also for a 2nd opinion about her current sx    Lab Results  Component Value Date   TSH 0.85 06/08/2017     Patient Active Problem List   Diagnosis Date Noted  . Orthostatic hypotension 12/06/2016  . Viral URI with cough 04/20/2016  . Sinus node dysfunction ([place]) 11/11/2015  . Routine general medical examination at a health care facility 06/04/2015  . Insomnia with sleep apnea 06/04/2015  . Bronchitis, chronic obstructive w acute bronchitis ([place]) 12/10/2014  . Lumbar disc disease 09/01/2010  . Obstructive sleep apnea 05/31/2007  . Hyperlipidemia with target LDL less than 130 02/05/2007  . Depression with anxiety 02/05/2007  . Allergic rhinitis 02/05/2007  . Hypothyroidism 02/02/2007  . Essential hypertension 02/02/2007    Past Medical History:  Diagnosis Date  . Allergic rhinitis   . Depression   . DJD (degenerative joint disease)   . GERD (gastroesophageal reflux disease)   . Headache(784.0)   . Hypercholesteremia   . Hypersomnia   . Hypertension   . Hypothyroidism   . Internal hemorrhoid 11/2008   s/p banding ([facility])  . Lumbar disc disease   . OBSTRUCTIVE SLEEP APNEA    NPSG 04/29/07- AHI 1.1/hr, RDI 21.2/hr. Weight 176 lbs. CPAP was tried based on the RDI.    [name]  OVERACTIVE BLADDER   . Presence of permanent cardiac pacemaker 11/11/2015  . Reactive depression (situational)   . Sinus node dysfunction (HCC)   . Spondylosis, cervical     Past Surgical History:  Procedure Laterality Date  . BIOPSY BREAST    . BREAST EXCISIONAL BIOPSY Left 2011  . EP IMPLANTABLE DEVICE N/A 11/11/2015   Procedure: Pacemaker Implant;  Surgeon: Evans Lance, MD;  Location: [facility];  Service: Cardiovascular;  Laterality: N/A;  . eye lid lift    . INSERT / REPLACE / REMOVE PACEMAKER  11/11/2015  . KNEE ARTHROSCOPY     Dr. [name]  . rotator cuff surgery     Dr. [name]  . TUBAL LIGATION      Social History    Tobacco Use  . Smoking status: Former Smoker    Last attempt to quit: 01/11/1968    Years since quitting: 49.4  . Smokeless tobacco: Never Used  Substance Use Topics  . Alcohol use: No  . Drug use: No    Family History  Problem Relation Age of Onset  . Crohn's disease Father 20  . Macular degeneration Mother   . Cancer Mother 73       bladder  . Hypothyroidism Mother   . Tremor Mother   . Stroke Mother 64  . Parkinsonism Maternal Grandmother     Allergies  Allergen Reactions  . Ramipril     REACTION: pt states INTOLERANT to Altace  . Sulfa Antibiotics     Says it dropped her blood pressure really low.   . Sulfonamide Derivatives     REACTION: hypotension    Medication list has been reviewed and updated.  Current Outpatient Medications on File Prior to Visit  Medication Sig Dispense Refill  . amLODipine (NORVASC) 5 MG tablet Take 1 tablet (5 mg total) by mouth daily. 90 tablet 2  . Cholecalciferol (VITAMIN D3) 5000 units CAPS Take 1 capsule by mouth daily.     . clonazePAM (KLONOPIN) 0.5 MG tablet Take 1/2 at bedtime as needed for insomnia, may increase to a whole tablet if needed 30 tablet 0  . Cyanocobalamin (B-12) 5000 MCG CAPS Take 5,000 mcg by mouth daily.    [name] FLUoxetine (PROZAC) 40 MG capsule Take 1 capsule (40 mg total) by mouth daily. 90 capsule 1  . fluticasone (FLONASE) 50 MCG/ACT nasal spray Place 2 sprays into both nostrils daily. 16 g 5  . levothyroxine (SYNTHROID, LEVOTHROID) 125 MCG tablet TAKE 1 TABLET EVERY DAY 90 tablet 1  . telmisartan (MICARDIS) 80 MG tablet Take 1 tablet (80 mg total) by mouth daily. 30 tablet 1   No current facility-administered medications on file prior to visit.     Review of Systems:  As per HPI- otherwise negative. No fever or chills No CP or SOB Wt Readings from Last 3 Encounters:  06/08/17 148 lb (67.1 kg)  06/02/17 148 lb 9.6 oz (67.4 kg)  05/19/17 153 lb 2 oz (69.5 kg)   Weight is stable   Physical  Examination: Vitals:   06/08/17 0940  BP: 112/70  Pulse: 87  Resp: 18  SpO2: 99%   Vitals:   06/08/17 0940  Weight: 148 lb (67.1 kg)  Height: 4\' 11"  (1.499 m)   Body mass index is 29.89 kg/m. Ideal Body Weight: Weight in (lb) to have BMI = 25: 123.5  GEN: WDWN, NAD, Non-toxic, A & O x 3. Overweight, looks well HEENT: Atraumatic, Normocephalic. Neck supple. No masses, No  LAD.  Bilateral TM wnl, oropharynx normal.  PEERL,EOMI.   Ears and Nose: No external deformity. CV: RRR, No M/G/R. No JVD. No thrill. No extra heart sounds. PULM: CTA B, no wheezes, crackles, rhonchi. No retractions. No resp. distress. No accessory muscle use. ABD: S, NT, ND, +BS. No rebound. No HSM.  Benign belly at this time  EXTR: No c/c/e NEURO Normal gait.  PSYCH: Normally interactive. Conversant. Not depressed or anxious appearing.  Calm demeanor.    Assessment and Plan: Essential hypertension - Plan: CBC, Comprehensive metabolic panel  Hypothyroidism (acquired) - Plan: TSH  B12 deficiency - Plan: B12  Indigestion - Plan: H. pylori breath test, Ambulatory referral to Gastroenterology  Chronic fatigue  Obstructive sleep apnea  Here today with complaint of chronic abd discomfort for at least 2 years  Recent colonoscopy but has not had an endoscopy as of yet Will check labs and look for H pylori.   She would like a referral to [facility] which is fine Discussed possible OSA causing her fatigue and encouraged a sleep study.   If all her labs are normal she may consider having this done  Signed Lamar Blinks, MD    Await H pylori for lab communication Received h pylori, message to pt 5/31 Results for orders placed or performed in visit on 06/08/17  CBC  Result Value Ref Range   WBC 5.2 4.0 - 10.5 K/uL   RBC 4.09 3.87 - 5.11 Mil/uL   Platelets 354.0 150.0 - 400.0 K/uL   Hemoglobin 12.0 12.0 - 15.0 g/dL   HCT 35.9 (L) 36.0 - 46.0 %   MCV 87.7 78.0 - 100.0 fl   MCHC 33.4 30.0 - 36.0 g/dL    RDW 14.3 11.5 - 15.5 %  Comprehensive metabolic panel  Result Value Ref Range   Sodium 138 135 - 145 mEq/L   Potassium 3.6 3.5 - 5.1 mEq/L   Chloride 101 96 - 112 mEq/L   CO2 29 19 - 32 mEq/L   Glucose, Bld 84 70 - 99 mg/dL   BUN 16 6 - 23 mg/dL   Creatinine, Ser 0.75 0.40 - 1.20 mg/dL   Total Bilirubin 0.5 0.2 - 1.2 mg/dL   Alkaline Phosphatase 47 39 - 117 U/L   AST 17 0 - 37 U/L   ALT 15 0 - 35 U/L   Total Protein 7.0 6.0 - 8.3 g/dL   Albumin 4.2 3.5 - 5.2 g/dL   Calcium 9.2 8.4 - 10.5 mg/dL   GFR 80.19 >60.00 mL/min  TSH  Result Value Ref Range   TSH 0.85 0.35 - 4.50 uIU/mL  B12  Result Value Ref Range   Vitamin B-12 1,344 (H) 211 - 911 pg/mL  H. pylori breath test  Result Value Ref Range   H. pylori Breath Test NOT DETECTED NOT DETECT   Blood counts are normal Metabolic profile is normal Thyroid normal B12 level is a bit high- this is not dangerous, but you might consider decreasing your supplement dose H pylori test is negative  We have a referral to [facility] pending.  Would you like to do a sleep study after all?  I don't see another apparent cause of your fatigue and sleep apnea is a possibility

## 2017-06-08 ENCOUNTER — Ambulatory Visit (INDEPENDENT_AMBULATORY_CARE_PROVIDER_SITE_OTHER): Payer: PPO | Admitting: Family Medicine

## 2017-06-08 ENCOUNTER — Encounter: Payer: Self-pay | Admitting: Family Medicine

## 2017-06-08 ENCOUNTER — Encounter

## 2017-06-08 VITALS — BP 112/70 | HR 87 | Resp 18 | Ht 59.0 in | Wt 148.0 lb

## 2017-06-08 DIAGNOSIS — I10 Essential (primary) hypertension: Secondary | ICD-10-CM

## 2017-06-08 DIAGNOSIS — E039 Hypothyroidism, unspecified: Secondary | ICD-10-CM | POA: Diagnosis not present

## 2017-06-08 DIAGNOSIS — E538 Deficiency of other specified B group vitamins: Secondary | ICD-10-CM | POA: Diagnosis not present

## 2017-06-08 DIAGNOSIS — G4733 Obstructive sleep apnea (adult) (pediatric): Secondary | ICD-10-CM

## 2017-06-08 DIAGNOSIS — R5382 Chronic fatigue, unspecified: Secondary | ICD-10-CM

## 2017-06-08 DIAGNOSIS — K30 Functional dyspepsia: Secondary | ICD-10-CM | POA: Diagnosis not present

## 2017-06-08 LAB — COMPREHENSIVE METABOLIC PANEL
ALBUMIN: 4.2 g/dL (ref 3.5–5.2)
ALK PHOS: 47 U/L (ref 39–117)
ALT: 15 U/L (ref 0–35)
AST: 17 U/L (ref 0–37)
BUN: 16 mg/dL (ref 6–23)
CHLORIDE: 101 meq/L (ref 96–112)
CO2: 29 mEq/L (ref 19–32)
Calcium: 9.2 mg/dL (ref 8.4–10.5)
Creatinine, Ser: 0.75 mg/dL (ref 0.40–1.20)
GFR: 80.19 mL/min (ref >60.00)
Glucose, Bld: 84 mg/dL (ref 70–99)
Potassium: 3.6 mEq/L (ref 3.5–5.1)
Sodium: 138 mEq/L (ref 135–145)
Total Bilirubin: 0.5 mg/dL (ref 0.2–1.2)
Total Protein: 7 g/dL (ref 6.0–8.3)

## 2017-06-08 LAB — CBC
HEMATOCRIT: 35.9 % — AB (ref 36.0–46.0)
HEMOGLOBIN: 12 g/dL (ref 12.0–15.0)
MCHC: 33.4 g/dL (ref 30.0–36.0)
MCV: 87.7 fl (ref 78.0–100.0)
Platelets: 354 10*3/uL (ref 150.0–400.0)
RBC: 4.09 Mil/uL (ref 3.87–5.11)
RDW: 14.3 % (ref 11.5–15.5)
WBC: 5.2 10*3/uL (ref 4.0–10.5)

## 2017-06-08 LAB — VITAMIN B12: Vitamin B-12: 1344 pg/mL — ABNORMAL HIGH (ref 211–911)

## 2017-06-08 LAB — TSH: TSH: 0.85 u[IU]/mL (ref 0.35–4.50)

## 2017-06-08 NOTE — Patient Instructions (Signed)
Please go to the lab for a blood draw and breath test Depending on your results we will plan to have you see Dr. Loletha Carrow and/ or have a repeat sleep study Let me know if any changes or worsening in the meantime

## 2017-06-09 ENCOUNTER — Encounter: Payer: Self-pay | Admitting: Family Medicine

## 2017-06-09 LAB — H. PYLORI BREATH TEST: H. PYLORI BREATH TEST: NOT DETECTED

## 2017-06-11 ENCOUNTER — Encounter: Payer: Self-pay | Admitting: Family Medicine

## 2017-06-12 DIAGNOSIS — L814 Other melanin hyperpigmentation: Secondary | ICD-10-CM | POA: Diagnosis not present

## 2017-06-12 DIAGNOSIS — L821 Other seborrheic keratosis: Secondary | ICD-10-CM | POA: Diagnosis not present

## 2017-06-12 DIAGNOSIS — L570 Actinic keratosis: Secondary | ICD-10-CM | POA: Diagnosis not present

## 2017-06-12 DIAGNOSIS — D225 Melanocytic nevi of trunk: Secondary | ICD-10-CM | POA: Diagnosis not present

## 2017-06-12 DIAGNOSIS — L720 Epidermal cyst: Secondary | ICD-10-CM | POA: Diagnosis not present

## 2017-06-13 ENCOUNTER — Telehealth: Payer: Self-pay | Admitting: Gastroenterology

## 2017-06-13 NOTE — Telephone Encounter (Signed)
She can make a new patient appointment to see me.  I will review her records when I see her.

## 2017-06-13 NOTE — Telephone Encounter (Signed)
Received referral for patient to be seen for indigestion. Patient was last seen by Dr.Medoff on 1.29.19 but now states Dr.Medoff is not in her network. Patient is requesting to see Dr.Danis. Records in Care everywhere for review.

## 2017-06-14 DIAGNOSIS — Z01419 Encounter for gynecological examination (general) (routine) without abnormal findings: Secondary | ICD-10-CM | POA: Diagnosis not present

## 2017-06-14 LAB — CUP PACEART REMOTE DEVICE CHECK
Date Time Interrogation Session: 20190605090826
Implantable Lead Implant Date: 20171101
Implantable Lead Location: 753860
Implantable Lead Model: 377
Implantable Lead Model: 377
Implantable Lead Serial Number: 49617393
Implantable Pulse Generator Implant Date: 20171101
MDC IDC LEAD IMPLANT DT: 20171101
MDC IDC LEAD LOCATION: 753859
MDC IDC LEAD SERIAL: 49553678
Pulse Gen Serial Number: 68817609

## 2017-06-16 ENCOUNTER — Encounter: Payer: Self-pay | Admitting: Gastroenterology

## 2017-06-29 ENCOUNTER — Ambulatory Visit (INDEPENDENT_AMBULATORY_CARE_PROVIDER_SITE_OTHER): Payer: PPO | Admitting: Psychology

## 2017-06-29 DIAGNOSIS — F4321 Adjustment disorder with depressed mood: Secondary | ICD-10-CM | POA: Diagnosis not present

## 2017-07-06 ENCOUNTER — Encounter: Payer: Self-pay | Admitting: Family Medicine

## 2017-07-06 DIAGNOSIS — J301 Allergic rhinitis due to pollen: Secondary | ICD-10-CM

## 2017-07-06 MED ORDER — LEVOCETIRIZINE DIHYDROCHLORIDE 5 MG PO TABS: 5.0000 mg | ORAL_TABLET | Freq: Every evening | ORAL | 3 refills | Status: DC

## 2017-07-06 NOTE — Telephone Encounter (Signed)
Prescription originally prescribed by another provider. Please refill if appropriate. I have pended medication.

## 2017-07-10 NOTE — Telephone Encounter (Signed)
New patient appt was made for 8.5.19

## 2017-07-18 ENCOUNTER — Ambulatory Visit: Payer: PPO | Admitting: Psychology

## 2017-07-18 DIAGNOSIS — F4321 Adjustment disorder with depressed mood: Secondary | ICD-10-CM | POA: Diagnosis not present

## 2017-07-25 ENCOUNTER — Encounter: Payer: Self-pay | Admitting: Physician Assistant

## 2017-07-25 ENCOUNTER — Ambulatory Visit: Payer: PPO | Admitting: Physician Assistant

## 2017-07-25 VITALS — BP 110/70 | HR 64 | Ht 59.0 in | Wt 150.8 lb

## 2017-07-25 DIAGNOSIS — R198 Other specified symptoms and signs involving the digestive system and abdomen: Secondary | ICD-10-CM

## 2017-07-25 DIAGNOSIS — K219 Gastro-esophageal reflux disease without esophagitis: Secondary | ICD-10-CM | POA: Diagnosis not present

## 2017-07-25 MED ORDER — FAMOTIDINE 20 MG PO TABS: ORAL_TABLET | ORAL | 3 refills | Status: DC

## 2017-07-25 NOTE — Progress Notes (Signed)
I

## 2017-07-25 NOTE — Progress Notes (Signed)
Subjective:    Patient ID: Theresa Mejia, female    DOB: 1940-10-20, 77 y.o.   MRN: [id number]  HPI Theresa Mejia is a 77 year old white female, new to GI today, referred by Dr. Anderson Malta Copland for evaluation of reflux and chronic bowel issues.  She is known previously to Dr. Richmond Campbell, was last seen there in January 2019 and is transferring here because Dr. Earlean Shawl is now out of network and also because she wants her care to be all within one practice.  Patient has history of hypertension, sleep apnea, hyperlipidemia, depression, she has history of symptomatic bradycardia secondary to sinus node dysfunction and is status post pacemaker placement. She had colonoscopy per Dr. Earlean Shawl in 2010 which was normal with the exception of grade 2 internal hemorrhoids.  She also had follow-up colonoscopy in 2017 however I cannot see that report in our system.  He has not had prior EGD to her knowledge. She says she has had intermittent problems with GERD over the years but has not required any maintenance medication.  She says her symptoms have been worse over the past 4 to 5 months.  She had been instructed to take omeprazole when she last saw Dr. Earlean Shawl but she says she never took it because she was concerned about long-term side effects.  She is uses Pepcid on a as needed basis which she does find helpful but does not use regularly.  She is now describing fairly constant symptoms of fullness and a "hungry sensation or gnawing type of sensation in her epigastrium and her chest.  She says symptoms seem to be worse if she is hungry or if she is too full and she describes a feeling as indigestion not necessarily burning.  She does have some sour brash.  He has no complaints of dysphasia or odynophagia.  Does have nighttime and daytime symptoms.  She tried to elevate the head of her bed and actually bought a new bed which does incline however she is usually a stomach sleeper and cannot stay in position. Appetite is been  fine, weight has been stable.   No complaints of abdominal pain. She has chronic issues with her bowels over the past few years stating she has difficulty completely evacuating.  She will have a bowel movement in the morning with passage of some stool, sometimes pellet-like but then will get urgency for bowel movement again a couple of times within the first few hours of the morning and has to go back to the bathroom.  She says sometimes she has to sit for a long time and does not feel that she has an adequate push at times.  She has not had any bleeding, has no complaints of rectal pain. She did have banding of internal hemorrhoids x3 per Dr. Earlean Shawl for the same symptoms with no improvement. She has  been on Benefiber chronically.  She took VS L#3 in the past with no benefit.  Review of Systems Pertinent positive and negative review of systems were noted in the above HPI section.  All other review of systems was otherwise negative.  Outpatient Encounter Medications as of 07/25/2017  Medication Sig  . amLODipine (NORVASC) 5 MG tablet Take 1 tablet (5 mg total) by mouth daily.  . Cholecalciferol (VITAMIN D) 2000 units CAPS Take 1 capsule by mouth daily.  . clonazePAM (KLONOPIN) 0.5 MG tablet Take 1/2 at bedtime as needed for insomnia, may increase to a whole tablet if needed  . FLUoxetine (PROZAC) 40 MG  capsule Take 1 capsule (40 mg total) by mouth daily.  . fluticasone (FLONASE) 50 MCG/ACT nasal spray Place 2 sprays into both nostrils daily.  [name] levocetirizine (XYZAL) 5 MG tablet Take 1 tablet (5 mg total) by mouth every evening.  [name] levothyroxine (SYNTHROID, LEVOTHROID) 125 MCG tablet TAKE 1 TABLET EVERY DAY (Patient taking differently: TAKE 1/2  TABLET EVERY DAY)  . telmisartan (MICARDIS) 80 MG tablet Take 1 tablet (80 mg total) by mouth daily.  . famotidine (PEPCID) 20 MG tablet Take 1 tablet by mouth twice daily.  . [DISCONTINUED] Cholecalciferol (VITAMIN D3) 5000 units CAPS Take 1 capsule by  mouth daily.   . [DISCONTINUED] Cyanocobalamin (B-12) 5000 MCG CAPS Take 5,000 mcg by mouth daily.   No facility-administered encounter medications on file as of 07/25/2017.    Allergies  Allergen Reactions  . Ramipril     REACTION: pt states INTOLERANT to Altace  . Sulfa Antibiotics     Says it dropped her blood pressure really low.   . Sulfonamide Derivatives     REACTION: hypotension   Patient Active Problem List   Diagnosis Date Noted  . Orthostatic hypotension 12/06/2016  . Viral URI with cough 04/20/2016  . Sinus node dysfunction ([place]) 11/11/2015  . Routine general medical examination at a health care facility 06/04/2015  . Insomnia with sleep apnea 06/04/2015  . Bronchitis, chronic obstructive w acute bronchitis ([place]) 12/10/2014  . Lumbar disc disease 09/01/2010  . Obstructive sleep apnea 05/31/2007  . Hyperlipidemia with target LDL less than 130 02/05/2007  . Depression with anxiety 02/05/2007  . Allergic rhinitis 02/05/2007  . Hypothyroidism 02/02/2007  . Essential hypertension 02/02/2007   Social History   Socioeconomic History  . Marital status: Married    Spouse name: Not on file  . Number of children: 2  . Years of education: Not on file  . Highest education level: Not on file  Occupational History  . 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 Financial resource strain: Not on file  . Food insecurity:    Worry: Not on file    Inability: Not on file  . Transportation needs:    Medical: Not on file    Non-medical: Not on file  Tobacco Use  . Smoking status: Former Smoker    Last attempt to quit: 01/11/1968    Years since quitting: 49.5  . Smokeless tobacco: Never Used  Substance and Sexual Activity  . Alcohol use: No  . Drug use: No  . Sexual activity: Never  Lifestyle  . Physical activity:    Days per week: Not on file    Minutes per session: Not on file  . Stress: Not on file    Relationships  . Social connections:    Talks on phone: Not on file    Gets together: Not on file    Attends religious service: Not on file    Active member of club or organization: Not on file    Attends meetings of clubs or organizations: Not on file    Relationship status: Not on file  . Intimate partner violence:    Fear of current or ex partner: Not on file    Emotionally abused: Not on file    Physically abused: Not on file    Forced sexual activity: Not on file  Other Topics Concern  . Not on file  Social History Narrative   married, lives with  spouse -   Retired 05/2013    Theresa Mejia's family history includes Cancer (age of onset: 10) in her mother; Crohn's disease (age of onset: 27) in her father; Hypothyroidism in her mother; Macular degeneration in her mother; Parkinsonism in her maternal grandmother; Stroke (age of onset: 9) in her mother; Tremor in her mother.      Objective:    Vitals:   07/25/17 0938  BP: 110/70  Pulse: 64    Physical Exam; developed older white female in no acute distress, pleasant blood pressure 110/70 pulse 64, height 4 foot 11, weight 150, BMI 30.4.  HEENT; nontraumatic normocephalic EOMI PERRLA sclera anicteric, Oropharynx ;benign, Cardiovascular; regular rate and rhythm with S1-S2 no murmur rub or gallop she does have a pacemaker in the left chest wall, Pulmonary ;clear bilaterally, Abdomen ;soft, nontender nondistended bowel sounds are active there is no palpable mass or hepatosplenomegaly, Rectal; exam not done, Extremities ;no clubbing, cyanosis or edema skin warm dry, Neuro psych; alert and oriented, grossly nonfocal mood and affect appropriate       Assessment & Plan:   #58 77 year old white female with symptomatic GERD, worse over the past 4 to 5 months.  Patient has not been compliant with acid reducing medication in any sort of regular basis. No dysphagia or odynophagia and weight has been stable.  #2 alteration in bowel habits  and incomplete evacuation.  I suspect this is related to pelvic floor dysfunction/laxity with age.  She may also have a component of IBS. #3 colon cancer surveillance-up-to-date with last colonoscopy 2017, will obtain report 4.  History of internal hemorrhoids status post banding x3 per Dr. Earlean Shawl 2017 no improvement in bowel symptoms #5 hypertension 6.  Obstructive sleep apnea 7.  Hyperlipidemia 8.  History of symptomatic bradycardia status post pacemaker  Plan; We discussed a strict antireflux regimen, she was given an antireflux education sheet and antireflux diet Start Pepcid 20 mg p.o. twice daily to be taken regularly and not on a as needed basis. N.p.o. for 3 hours prior to bedtime and continue to try to sleep with head of bed elevated She will continue Benefiber daily and will give her a trial of adding MiraLAX 17 g in 8 ounces of water each evening which may help with more complete bowel evacuation. Patient has follow-up scheduled with Dr. Loletha Carrow who she wishes to establish with and will be seen in follow-up in a month.   If reflux symptoms fail to improve we will need to consider EGD.  Ember Henrikson S Catelin Manthe PA-C 07/25/2017   Cc: Copland, Gay Filler, MD

## 2017-07-25 NOTE — Patient Instructions (Addendum)
We sent a prescription to [facility] in [facility] [address] for Pepcid 20 mg. Continue Benefiber daily. Try adding Miralax 17 grams in 8 oz of water in the evening.  We have provided you with an anti-reflux regimine.   We made you an appointment with Dr. Wilfrid Lund for 09-01-2017 at 9:45 am.   If you are age 75 or older, your body mass index should be between 23-30. Your Body mass index is 30.46 kg/m. If this is out of the aforementioned range listed, please consider follow up with your Primary Care Provider.

## 2017-07-26 NOTE — Progress Notes (Signed)
Thank you for sending this case to me. I have reviewed the entire note, and the outlined plan seems appropriate.  Agree with your assessment, and I will reevaluate at her follow up with me.  Wilfrid Lund, MD

## 2017-08-01 ENCOUNTER — Other Ambulatory Visit: Payer: Self-pay | Admitting: Family Medicine

## 2017-08-01 DIAGNOSIS — F418 Other specified anxiety disorders: Secondary | ICD-10-CM

## 2017-08-08 ENCOUNTER — Ambulatory Visit: Payer: PPO | Admitting: Psychology

## 2017-08-09 ENCOUNTER — Telehealth: Payer: Self-pay | Admitting: Internal Medicine

## 2017-08-09 NOTE — Telephone Encounter (Signed)
Returned call to Pt. Advised does not need antibiotic prior to dental work. Advised overdue for yearly appt. Yearly appt made.

## 2017-08-09 NOTE — Telephone Encounter (Signed)
New Message:    Pt has a pacemaker and she wants to know  If she needs to be pre medicated before dental work?

## 2017-08-14 ENCOUNTER — Ambulatory Visit: Payer: Self-pay | Admitting: Gastroenterology

## 2017-08-16 ENCOUNTER — Ambulatory Visit (INDEPENDENT_AMBULATORY_CARE_PROVIDER_SITE_OTHER): Payer: PPO | Admitting: *Deleted

## 2017-08-16 DIAGNOSIS — I495 Sick sinus syndrome: Secondary | ICD-10-CM

## 2017-08-16 DIAGNOSIS — R001 Bradycardia, unspecified: Secondary | ICD-10-CM

## 2017-08-17 NOTE — Progress Notes (Signed)
Remote pacemaker transmission.   

## 2017-08-22 ENCOUNTER — Ambulatory Visit: Payer: PPO | Admitting: Psychology

## 2017-08-22 DIAGNOSIS — F4321 Adjustment disorder with depressed mood: Secondary | ICD-10-CM

## 2017-08-25 HISTORY — PX: MOUTH SURGERY: SHX715

## 2017-09-01 ENCOUNTER — Ambulatory Visit: Payer: PPO | Admitting: Gastroenterology

## 2017-09-01 ENCOUNTER — Encounter: Payer: Self-pay | Admitting: Gastroenterology

## 2017-09-01 VITALS — BP 130/80 | HR 74 | Ht 59.0 in | Wt 150.0 lb

## 2017-09-01 DIAGNOSIS — K219 Gastro-esophageal reflux disease without esophagitis: Secondary | ICD-10-CM | POA: Diagnosis not present

## 2017-09-01 DIAGNOSIS — R198 Other specified symptoms and signs involving the digestive system and abdomen: Secondary | ICD-10-CM

## 2017-09-01 NOTE — Progress Notes (Signed)
[facility] Progress Note  Chief Complaint: GERD and constipation  Subjective  History:  Theresa Mejia follows up for her GERD and altered bowel habits.  She takes Pepcid once a day and typically does not remember the evening dose.  She still has breakthrough heartburn symptoms during the day and evening.  It does not wake her from sleep and she has no nocturnal regurgitation or water brash or coughing.  She still feels the need for BM several times a day, but she has to sit for a while, and they are often small with feelings of incomplete evacuation.  She bought a stool for raising her feet while on the toilet, but has not yet used it.  She did buy a new bed to keep her head of bed elevated at night.  Theresa Mejia has been taking the fiber supplement regularly, but lately did not feel the need for the MiraLAX since the bowel habits were more frequent while on antibiotics after dental surgery.  ROS: Cardiovascular:  no chest pain Respiratory: no dyspnea  The patient's Past Medical, Family and Social History were reviewed and are on file in the EMR.  Objective:  Med list reviewed  Current Outpatient Medications:  .  amLODipine (NORVASC) 5 MG tablet, Take 1 tablet (5 mg total) by mouth daily., Disp: 90 tablet, Rfl: 2 .  amoxicillin (AMOXIL) 500 MG capsule, Take 1 capsule by mouth 3 (three) times daily with meals. For recent dental procedure, Disp: , Rfl: 0 .  clonazePAM (KLONOPIN) 0.5 MG tablet, Take 1/2 at bedtime as needed for insomnia, may increase to a whole tablet if needed, Disp: 30 tablet, Rfl: 0 .  famotidine (PEPCID) 20 MG tablet, Take 1 tablet by mouth twice daily., Disp: 60 tablet, Rfl: 3 .  FLUoxetine (PROZAC) 40 MG capsule, TAKE 1 CAPSULE BY MOUTH EVERY DAY, Disp: 90 capsule, Rfl: 1 .  fluticasone (FLONASE) 50 MCG/ACT nasal spray, Place 2 sprays into both nostrils daily., Disp: 16 g, Rfl: 5 .  levocetirizine (XYZAL) 5 MG tablet, Take 1 tablet (5 mg total) by mouth every evening.,  Disp: 90 tablet, Rfl: 3 .  levothyroxine (SYNTHROID, LEVOTHROID) 125 MCG tablet, TAKE 1 TABLET EVERY DAY (Patient taking differently: TAKE 1/2  TABLET EVERY DAY), Disp: 90 tablet, Rfl: 1 .  telmisartan (MICARDIS) 80 MG tablet, Take 1 tablet (80 mg total) by mouth daily., Disp: 30 tablet, Rfl: 1   Vital signs in last 24 hrs: Vitals:   09/01/17 0945  BP: 130/80  Pulse: 74    Physical Exam  Well-appearing  HEENT: sclera anicteric, oral mucosa moist without lesions  Neck: supple, no thyromegaly, JVD or lymphadenopathy  Cardiac: RRR without murmurs, S1S2 heard, no peripheral edema  Pulm: clear to auscultation bilaterally, normal RR and effort noted  Abdomen: soft, no tenderness, with active bowel sounds. No guarding or palpable hepatosplenomegaly.  Skin; warm and dry, no jaundice or rash   @ASSESSMENTPLANBEGIN @ Assessment: Encounter Diagnoses  Name Primary?  . Gastroesophageal reflux disease without esophagitis Yes  . Change in bowel function    Chronic GERD symptoms, suboptimal symptom control.  We therefore talked about the limitation of acid suppression therapy controlling reflux symptoms.  I offered upper endoscopy to assess for erosive esophagitis, hiatal hernia or other potential contributing factors and complications of reflux.  She would like to give it more consideration.  I think it might help her make a more confident long-term decision about what to do with meds.  She reports previous  care by Dr. Earlean Shawl with colonoscopy for these change in bowel habits, and then Royal banding that was reportedly done in hopes that it might relieve the symptoms as well.  However, that was unsuccessful.  It sounds to me like she has pelvic floor dysfunction.  We discussed the nature of this condition.  I am not certain if an anorectal motility would be revealing for any condition amenable to therapy.  She does not seem inclined to pursue that.  She will continue her current bowel regimen  and follow-up with me as needed.   Total time 25 minutes, over half spent face-to-face with patient in counseling and coordination of care.   Nelida Meuse III

## 2017-09-01 NOTE — Patient Instructions (Signed)
If you are age 75 or older, your body mass index should be between 23-30. Your Body mass index is 30.3 kg/m. If this is out of the aforementioned range listed, please consider follow up with your Primary Care Provider.  If you are age 67 or younger, your body mass index should be between 19-25. Your Body mass index is 30.3 kg/m. If this is out of the aformentioned range listed, please consider follow up with your Primary Care Provider.   Follow up as needed.   It was a pleasure to see you today!  Dr. Loletha Carrow

## 2017-09-04 ENCOUNTER — Encounter: Payer: Self-pay | Admitting: Internal Medicine

## 2017-09-04 ENCOUNTER — Ambulatory Visit (INDEPENDENT_AMBULATORY_CARE_PROVIDER_SITE_OTHER): Payer: PPO | Admitting: Internal Medicine

## 2017-09-04 VITALS — BP 132/86 | HR 79 | Ht 59.0 in | Wt 151.4 lb

## 2017-09-04 DIAGNOSIS — Z950 Presence of cardiac pacemaker: Secondary | ICD-10-CM

## 2017-09-04 DIAGNOSIS — I10 Essential (primary) hypertension: Secondary | ICD-10-CM | POA: Diagnosis not present

## 2017-09-04 DIAGNOSIS — R002 Palpitations: Secondary | ICD-10-CM | POA: Diagnosis not present

## 2017-09-04 DIAGNOSIS — I495 Sick sinus syndrome: Secondary | ICD-10-CM | POA: Diagnosis not present

## 2017-09-04 LAB — CUP PACEART INCLINIC DEVICE CHECK
Date Time Interrogation Session: 20190826134306
Implantable Lead Implant Date: 20171101
Implantable Lead Location: 753859
Implantable Lead Location: 753860
Implantable Lead Model: 377
Lead Channel Impedance Value: 448 Ohm
Lead Channel Impedance Value: 526 Ohm
Lead Channel Pacing Threshold Amplitude: 0.6 V
Lead Channel Pacing Threshold Pulse Width: 0.4 ms
Lead Channel Sensing Intrinsic Amplitude: 12.6 mV
Lead Channel Setting Pacing Amplitude: 2 V
Lead Channel Setting Pacing Pulse Width: 0.4 ms
MDC IDC LEAD IMPLANT DT: 20171101
MDC IDC LEAD SERIAL: 49553678
MDC IDC LEAD SERIAL: 49617393
MDC IDC MSMT LEADCHNL RA PACING THRESHOLD AMPLITUDE: 0.8 V
MDC IDC MSMT LEADCHNL RA PACING THRESHOLD PULSEWIDTH: 0.4 ms
MDC IDC MSMT LEADCHNL RA SENSING INTR AMPL: 4.2 mV
MDC IDC PG IMPLANT DT: 20171101
MDC IDC PG SERIAL: 68817609
MDC IDC SET LEADCHNL RV PACING AMPLITUDE: 2 V
Pulse Gen Model: 394969

## 2017-09-04 NOTE — Patient Instructions (Signed)
Medication Instructions:  Your physician recommends that you continue on your current medications as directed. Please refer to the Current Medication list given to you today.  Labwork: None ordered.  Testing/Procedures: None ordered.  Follow-Up: Your physician wants you to follow-up in: one year with Dr. Lovena Le.   You will receive a reminder letter in the mail two months in advance. If you don't receive a letter, please call our office to schedule the follow-up appointment.  Remote monitoring is used to monitor your Pacemaker from home. This monitoring reduces the number of office visits required to check your device to one time per year. It allows [name] to keep an eye on the functioning of your device to ensure it is working properly. You are scheduled for a device check from home on 11/15/2017. You may send your transmission at any time that day. If you have a wireless device, the transmission will be sent automatically. After your physician reviews your transmission, you will receive a postcard with your next transmission date.  Any Other Special Instructions Will Be Listed Below (If Applicable).  If you need a refill on your cardiac medications before your next appointment, please call your pharmacy.

## 2017-09-04 NOTE — Progress Notes (Signed)
HPI Theresa Mejia returns today for ongoing evaluation of palpitations and chronotropic incompetence, s/p PPM insertion. She is a pleasant 77 yo woman who c/o fatigue, weakness and palpitations, and average HR of 48/min. She underwent PPM insertion a couple of years ago. She has been stable in the interim.  Allergies  Allergen Reactions  . Ramipril     REACTION: pt states INTOLERANT to Altace  . Sulfa Antibiotics     Says it dropped her blood pressure really low.   . Sulfonamide Derivatives     REACTION: hypotension     Current Outpatient Medications  Medication Sig Dispense Refill  . amLODipine (NORVASC) 5 MG tablet Take 1 tablet (5 mg total) by mouth daily. 90 tablet 2  . clonazePAM (KLONOPIN) 0.5 MG tablet Take 1/2 at bedtime as needed for insomnia, may increase to a whole tablet if needed 30 tablet 0  . famotidine (PEPCID) 20 MG tablet Take 1 tablet by mouth twice daily. 60 tablet 3  . FLUoxetine (PROZAC) 40 MG capsule TAKE 1 CAPSULE BY MOUTH EVERY DAY 90 capsule 1  . fluticasone (FLONASE) 50 MCG/ACT nasal spray Place 2 sprays into both nostrils daily. 16 g 5  . levocetirizine (XYZAL) 5 MG tablet Take 1 tablet (5 mg total) by mouth every evening. 90 tablet 3  . levothyroxine (SYNTHROID, LEVOTHROID) 125 MCG tablet Take 125 mcg by mouth daily before breakfast.    . telmisartan (MICARDIS) 80 MG tablet Take 1 tablet (80 mg total) by mouth daily. 30 tablet 1   No current facility-administered medications for this visit.      Past Medical History:  Diagnosis Date  . Allergic rhinitis   . Depression   . DJD (degenerative joint disease)   . GERD (gastroesophageal reflux disease)   . Headache(784.0)   . Hypercholesteremia   . Hypersomnia   . Hypertension   . Hypothyroidism   . Internal hemorrhoid 11/2008   s/p banding ([place])  . Lumbar disc disease   . OBSTRUCTIVE SLEEP APNEA    NPSG 04/29/07- AHI 1.1/hr, RDI 21.2/hr. Weight 176 lbs. CPAP was tried based on the RDI.    [name]  OVERACTIVE BLADDER   . Presence of permanent cardiac pacemaker 11/11/2015  . Reactive depression (situational)   . Sinus node dysfunction ([facility])   . Spondylosis, cervical     ROS:   All systems reviewed and negative except as noted in the HPI.   Past Surgical History:  Procedure Laterality Date  . BIOPSY BREAST    . BREAST EXCISIONAL BIOPSY Left 2011  . EP IMPLANTABLE DEVICE N/A 11/11/2015   Procedure: Pacemaker Implant;  Surgeon: Evans Lance, MD;  Location: [facility];  Service: Cardiovascular;  Laterality: N/A;  . eye lid lift    . INSERT / REPLACE / REMOVE PACEMAKER  11/11/2015  . KNEE ARTHROSCOPY     Dr. [name]  . MOUTH SURGERY  08/25/2017   implant fell out due to bone loss  . rotator cuff surgery     Dr. [name]  . TUBAL LIGATION       Family History  Problem Relation Age of Onset  . Crohn's disease Father 27  . Macular degeneration Mother   . Cancer Mother 73       bladder, never confirmed with biospy  . Hypothyroidism Mother   . Tremor Mother   . Stroke Mother 88  . Parkinsonism Maternal Grandmother   . Colon cancer Neg Hx   .  Esophageal cancer Neg Hx   . Rectal cancer Neg Hx      Social History   Socioeconomic History  . Marital status: Married    Spouse name: Not on file  . Number of children: 2  . Years of education: Not on file  . Highest education level: Not on file  Occupational History  . 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 Financial resource strain: Not on file  . Food insecurity:    Worry: Not on file    Inability: Not on file  . Transportation needs:    Medical: Not on file    Non-medical: Not on file  Tobacco Use  . Smoking status: Former Smoker    Years: 8.00    Types: Cigarettes    Last attempt to quit: 01/11/1968    Years since quitting: 49.6  . Smokeless tobacco: Never Used  Substance and Sexual Activity  . Alcohol use: No  . Drug use: No  .  Sexual activity: Never  Lifestyle  . Physical activity:    Days per week: Not on file    Minutes per session: Not on file  . Stress: Not on file  Relationships  . Social connections:    Talks on phone: Not on file    Gets together: Not on file    Attends religious service: Not on file    Active member of club or organization: Not on file    Attends meetings of clubs or organizations: Not on file    Relationship status: Not on file  . Intimate partner violence:    Fear of current or ex partner: Not on file    Emotionally abused: Not on file    Physically abused: Not on file    Forced sexual activity: Not on file  Other Topics Concern  . Not on file  Social History Narrative   married, lives with spouse -   Retired 05/2013     BP 132/86   Pulse 79   Ht 4\' 11"  (1.499 m)   Wt 151 lb 6.4 oz (68.7 kg)   SpO2 96%   BMI 30.58 kg/m   Physical Exam:  Well appearing 77 yo woman, NAD HEENT: Unremarkable Neck:  6 cm JVD, no thyromegally Lymphatics:  No adenopathy Back:  No CVA tenderness Lungs:  Clear with no wheezes HEART:  Regular rate rhythm, no murmurs, no rubs, no clicks Abd:  soft, positive bowel sounds, no organomegally, no rebound, no guarding Ext:  2 plus pulses, no edema, no cyanosis, no clubbing Skin:  No rashes no nodules Neuro:  CN II through XII intact, motor grossly intact  EKG - NSR with atrial pacing.  DEVICE  Normal device function.  See PaceArt for details.   Assess/Plan: 1. Sinus node dysfunction - she is stable and asymptomatic, s/p PPM insertion. 2. PPM - her Biotronik DDD PM is working normally. Will recheck in several months. 3. HTN - her BP is fairly well controlled. No change.   Mikle Bosworth.D.

## 2017-09-06 LAB — CUP PACEART REMOTE DEVICE CHECK
Battery Remaining Percentage: 85 %
Brady Statistic AP VP Percent: 0 %
Brady Statistic AS VP Percent: 0 %
Brady Statistic RA Percent Paced: 100 %
Brady Statistic RV Percent Paced: 0 %
Date Time Interrogation Session: 20190828051359
Implantable Lead Implant Date: 20171101
Implantable Lead Location: 753859
Implantable Lead Location: 753860
Implantable Lead Model: 377
Implantable Lead Model: 377
Implantable Lead Serial Number: 49617393
Implantable Pulse Generator Implant Date: 20171101
Lead Channel Impedance Value: 449 Ohm
Lead Channel Impedance Value: 527 Ohm
Lead Channel Pacing Threshold Amplitude: 0.6 V
Lead Channel Pacing Threshold Amplitude: 0.8 V
Lead Channel Pacing Threshold Pulse Width: 0.4 ms
Lead Channel Setting Pacing Amplitude: 2 V
Lead Channel Setting Pacing Pulse Width: 0.4 ms
MDC IDC LEAD IMPLANT DT: 20171101
MDC IDC LEAD SERIAL: 49553678
MDC IDC MSMT LEADCHNL RV PACING THRESHOLD PULSEWIDTH: 0.4 ms
MDC IDC PG SERIAL: 68817609
MDC IDC SET LEADCHNL RA PACING AMPLITUDE: 2 V
MDC IDC STAT BRADY AP VS PERCENT: 100 %
MDC IDC STAT BRADY AS VS PERCENT: 0 %

## 2017-09-21 ENCOUNTER — Ambulatory Visit (INDEPENDENT_AMBULATORY_CARE_PROVIDER_SITE_OTHER): Payer: PPO | Admitting: Psychology

## 2017-09-21 DIAGNOSIS — F4321 Adjustment disorder with depressed mood: Secondary | ICD-10-CM | POA: Diagnosis not present

## 2017-10-24 ENCOUNTER — Other Ambulatory Visit: Payer: Self-pay | Admitting: Family Medicine

## 2017-10-24 ENCOUNTER — Ambulatory Visit (INDEPENDENT_AMBULATORY_CARE_PROVIDER_SITE_OTHER): Payer: PPO | Admitting: Psychology

## 2017-10-24 DIAGNOSIS — F4321 Adjustment disorder with depressed mood: Secondary | ICD-10-CM | POA: Diagnosis not present

## 2017-11-03 ENCOUNTER — Encounter: Payer: Self-pay | Admitting: Family Medicine

## 2017-11-06 ENCOUNTER — Ambulatory Visit: Payer: Self-pay | Admitting: *Deleted

## 2017-11-10 ENCOUNTER — Other Ambulatory Visit: Payer: Self-pay | Admitting: Family Medicine

## 2017-11-13 NOTE — Progress Notes (Addendum)
Subjective:   Theresa Mejia is a 77 y.o. female who presents for Medicare Annual (Subsequent) preventive examination.  Pt reports she is on Prozac daily and has Klonopin for bedtime PRN. States she is depressed by her husbands worsening health. Also seeing therapist.   Review of Systems: No ROS.  Medicare Wellness Visit. Additional risk factors are reflected in the social history. Cardiac Risk Factors include: advanced age (>67men, >37 women);dyslipidemia;hypertension Sleep patterns: Takes Klonopin Home Safety/Smoke Alarms: Feels safe in home. Smoke alarms in place. Lives with husband  In 1 story home.  Female:     Mammo-utd       Dexa scan-utd        CCS-last 02/05/15 Eye- Dr.Lyles yearly. Last appt yesterday.     Objective:     Vitals: BP 132/84 (BP Location: Left Arm, Patient Position: Sitting, Cuff Size: Normal)   Pulse 74   Ht 4\' 11"  (1.499 m)   Wt 151 lb 6.4 oz (68.7 kg)   SpO2 97%   BMI 30.58 kg/m   Body mass index is 30.58 kg/m.  Advanced Directives 11/14/2017 11/03/2016 11/11/2015 06/08/2015 06/04/2015  Does Patient Have a Medical Advance Directive? Yes Yes Yes Yes Yes  Type of Paramedic of [employer];Living will [place];Living will - [place];Living will [place];Living will  Copy of [place] in Chart? Yes Yes Yes Yes Yes    Tobacco Social History   Tobacco Use  Smoking Status Former Smoker  . Years: 8.00  . Types: Cigarettes  . Last attempt to quit: 01/11/1968  . Years since quitting: 49.8  Smokeless Tobacco Never Used     Counseling given: Not Answered   Clinical Intake: Pain : No/denies pain    Past Medical History:  Diagnosis Date  . Allergic rhinitis   . Depression   . DJD (degenerative joint disease)   . GERD (gastroesophageal reflux disease)   . Headache(784.0)   . Hypercholesteremia   . Hypersomnia   . Hypertension   . Hypothyroidism     . Internal hemorrhoid 11/2008   s/p banding ([place])  . Lumbar disc disease   . OBSTRUCTIVE SLEEP APNEA    NPSG 04/29/07- AHI 1.1/hr, RDI 21.2/hr. Weight 176 lbs. CPAP was tried based on the RDI.    [name] OVERACTIVE BLADDER   . Presence of permanent cardiac pacemaker 11/11/2015  . Reactive depression (situational)   . Sinus node dysfunction (HCC)   . Spondylosis, cervical    Past Surgical History:  Procedure Laterality Date  . BIOPSY BREAST    . BREAST EXCISIONAL BIOPSY Left 2011  . EP IMPLANTABLE DEVICE N/A 11/11/2015   Procedure: Pacemaker Implant;  Surgeon: Evans Lance, MD;  Location: [facility];  Service: Cardiovascular;  Laterality: N/A;  . eye lid lift    . INSERT / REPLACE / REMOVE PACEMAKER  11/11/2015  . KNEE ARTHROSCOPY     Dr. [name]  . MOUTH SURGERY  08/25/2017   implant fell out due to bone loss  . rotator cuff surgery     Dr. [name]  . TUBAL LIGATION     Family History  Problem Relation Age of Onset  . Crohn's disease Father 43  . Macular degeneration Mother   . Cancer Mother 65       bladder, never confirmed with biospy  . Hypothyroidism Mother   . Tremor Mother   . Stroke Mother 62  . Parkinsonism Maternal  Grandmother   . Colon cancer Neg Hx   . Esophageal cancer Neg Hx   . Rectal cancer Neg Hx    Social History   Socioeconomic History  . Marital status: Married    Spouse name: Not on file  . Number of children: 2  . Years of education: Not on file  . Highest education level: Not on file  Occupational History  . 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 Financial resource strain: Not on file  . Food insecurity:    Worry: Not on file    Inability: Not on file  . Transportation needs:    Medical: Not on file    Non-medical: Not on file  Tobacco Use  . Smoking status: Former Smoker    Years: 8.00    Types: Cigarettes    Last attempt to quit: 01/11/1968    Years since  quitting: 49.8  . Smokeless tobacco: Never Used  Substance and Sexual Activity  . Alcohol use: No  . Drug use: No  . Sexual activity: Never  Lifestyle  . Physical activity:    Days per week: Not on file    Minutes per session: Not on file  . Stress: Not on file  Relationships  . Social connections:    Talks on phone: Not on file    Gets together: Not on file    Attends religious service: Not on file    Active member of club or organization: Not on file    Attends meetings of clubs or organizations: Not on file    Relationship status: Not on file  Other Topics Concern  . Not on file  Social History Narrative   married, lives with spouse -   Retired 05/2013    Outpatient Encounter Medications as of 11/14/2017  Medication Sig  . amLODipine (NORVASC) 5 MG tablet Take 1 tablet (5 mg total) by mouth daily.  . clonazePAM (KLONOPIN) 0.5 MG tablet Take 1/2 at bedtime as needed for insomnia, may increase to a whole tablet if needed  . famotidine (PEPCID) 20 MG tablet Take 1 tablet by mouth twice daily.  [name] FLUoxetine (PROZAC) 40 MG capsule TAKE 1 CAPSULE BY MOUTH EVERY DAY  . fluticasone (FLONASE) 50 MCG/ACT nasal spray SPRAY 2 SPRAYS INTO EACH NOSTRIL EVERY DAY  . levocetirizine (XYZAL) 5 MG tablet Take 1 tablet (5 mg total) by mouth every evening.  [name] levothyroxine (SYNTHROID, LEVOTHROID) 125 MCG tablet Take 0.5 tablets (62.5 mcg total) by mouth daily.  [name] telmisartan (MICARDIS) 80 MG tablet Take 1 tablet (80 mg total) by mouth daily.  [name] levothyroxine (SYNTHROID, LEVOTHROID) 125 MCG tablet Take 125 mcg by mouth daily before breakfast.   No facility-administered encounter medications on file as of 11/14/2017.     Activities of Daily Living In your present state of health, do you have any difficulty performing the following activities: 11/14/2017  Hearing? Y  Comment wearing hearing aids.  Vision? N  Difficulty concentrating or making decisions? N  Walking or climbing stairs? N    Dressing or bathing? N  Doing errands, shopping? N  Preparing Food and eating ? N  Using the Toilet? N  In the past six months, have you accidently leaked urine? N  Do you have problems with loss of bowel control? N  Managing your Medications? N  Managing your Finances? N  Housekeeping or managing your Housekeeping? N  Some recent data  might be hidden    Patient Care Team: Copland, Gay Filler, MD as PCP - General (Family Medicine) Deneise Lever, MD (Pulmonary Disease) Gaynelle Arabian, MD (Orthopedic Surgery) Richmond Campbell, MD (Gastroenterology)    Assessment:   This is a routine wellness examination for Kimyata. Physical assessment deferred to PCP.  Exercise Activities and Dietary recommendations Current Exercise Habits: Structured exercise class, Type of exercise: strength training/weights;treadmill, Frequency (Times/Week): 3, Intensity: Mild, Exercise limited by: None identified   Diet (meal preparation, eat out, water intake, caffeinated beverages, dairy products, fruits and vegetables): in general, a "healthy" diet  , well balanced   Goals    . Weight (lb) < 143 lb (64.9 kg)     Lose 10lbs. Goal 143lb       Fall Risk Fall Risk  11/14/2017 11/03/2016 06/08/2015 06/08/2015 06/04/2015  Falls in the past year? 0 No No No No   Depression Screen PHQ 2/9 Scores 11/14/2017 11/03/2016 06/08/2015 06/04/2015  PHQ - 2 Score 6 4 1 2   PHQ- 9 Score 21 19 7 6      Cognitive Function Ad8 score reviewed for issues:  Issues making decisions:no  Less interest in hobbies / activities:no  Repeats questions, stories (family complaining):no  Trouble using ordinary gadgets (microwave, computer, phone):no  Forgets the month or year: no  Mismanaging finances: no  Remembering appts:no  Daily problems with thinking and/or memory:no Ad8 score is=0   MMSE - Mini Mental State Exam 11/03/2016  Orientation to time 5  Orientation to Place 5  Registration 3  Attention/ Calculation 5   Recall 3  Language- name 2 objects 2  Language- repeat 1  Language- follow 3 step command 3  Language- read & follow direction 1  Write a sentence 1  Copy design 1  Total score 30        Immunization History  Administered Date(s) Administered  . Influenza Split 10/11/2010, 10/04/2013  . Influenza Whole 10/13/2009  . Influenza, High Dose Seasonal PF 10/14/2016  . Influenza-Unspecified 10/28/2015  . Pneumococcal Conjugate-13 11/06/2014  . Pneumococcal Polysaccharide-23 02/25/2008  . Td 02/25/2008  . Zoster Recombinat (Shingrix) 04/27/2017    Screening Tests Health Maintenance  Topic Date Due  . TETANUS/TDAP  02/24/2018  . MAMMOGRAM  05/19/2019  . COLONOSCOPY  02/04/2025  . INFLUENZA VACCINE  Completed  . DEXA SCAN  Completed  . PNA vac Low Risk Adult  Completed      Plan:    Please schedule your next medicare wellness visit with me in 1 yr.  Continue to eat heart healthy diet (full of fruits, vegetables, whole grains, lean protein, water--limit salt, fat, and sugar intake) and increase physical activity as tolerated.    I have personally reviewed and noted the following in the patient's chart:   . Medical and social history . Use of alcohol, tobacco or illicit drugs  . Current medications and supplements . Functional ability and status . Nutritional status . Physical activity . Advanced directives . List of other physicians . Hospitalizations, surgeries, and ER visits in previous 12 months . Vitals . Screenings to include cognitive, depression, and falls . Referrals and appointments  In addition, I have reviewed and discussed with patient certain preventive protocols, quality metrics, and best practice recommendations. A written personalized care plan for preventive services as well as general preventive health recommendations were provided to patient.     Shela Nevin, [place]  11/14/2017   Medical screening examination/treatment was performed by  qualified clinical staff member  and as supervising physician I was immediately available for consultation/collaboration. I have reviewed documentation and agree with assessment and plan.  Penni Homans, MD

## 2017-11-14 ENCOUNTER — Encounter: Payer: Self-pay | Admitting: *Deleted

## 2017-11-14 ENCOUNTER — Ambulatory Visit (INDEPENDENT_AMBULATORY_CARE_PROVIDER_SITE_OTHER): Payer: PPO | Admitting: *Deleted

## 2017-11-14 VITALS — BP 132/84 | HR 74 | Ht 59.0 in | Wt 151.4 lb

## 2017-11-14 DIAGNOSIS — Z0000 Encounter for general adult medical examination without abnormal findings: Secondary | ICD-10-CM

## 2017-11-14 NOTE — Patient Instructions (Signed)
Please schedule your next medicare wellness visit with me in 1 yr.  Continue to eat heart healthy diet (full of fruits, vegetables, whole grains, lean protein, water--limit salt, fat, and sugar intake) and increase physical activity as tolerated.   Theresa Mejia , Thank you for taking time to come for your Medicare Wellness Visit. I appreciate your ongoing commitment to your health goals. Please review the following plan we discussed and let me know if I can assist you in the future.   These are the goals we discussed: Goals    . Weight (lb) < 143 lb (64.9 kg)     Lose 10lbs. Goal 143lb       This is a list of the screening recommended for you and due dates:  Health Maintenance  Topic Date Due  . Tetanus Vaccine  02/24/2018  . Mammogram  05/19/2019  . Colon Cancer Screening  02/04/2025  . Flu Shot  Completed  . DEXA scan (bone density measurement)  Completed  . Pneumonia vaccines  Completed    Health Maintenance for Postmenopausal Women Menopause is a normal process in which your reproductive ability comes to an end. This process happens gradually over a span of months to years, usually between the ages of 36 and 74. Menopause is complete when you have missed 12 consecutive menstrual periods. It is important to talk with your health care provider about some of the most common conditions that affect postmenopausal women, such as heart disease, cancer, and bone loss (osteoporosis). Adopting a healthy lifestyle and getting preventive care can help to promote your health and wellness. Those actions can also lower your chances of developing some of these common conditions. What should I know about menopause? During menopause, you may experience a number of symptoms, such as:  Moderate-to-severe hot flashes.  Night sweats.  Decrease in sex drive.  Mood swings.  Headaches.  Tiredness.  Irritability.  Memory problems.  Insomnia.  Choosing to treat or not to treat menopausal  changes is an individual decision that you make with your health care provider. What should I know about hormone replacement therapy and supplements? Hormone therapy products are effective for treating symptoms that are associated with menopause, such as hot flashes and night sweats. Hormone replacement carries certain risks, especially as you become older. If you are thinking about using estrogen or estrogen with progestin treatments, discuss the benefits and risks with your health care provider. What should I know about heart disease and stroke? Heart disease, heart attack, and stroke become more likely as you age. This may be due, in part, to the hormonal changes that your body experiences during menopause. These can affect how your body processes dietary fats, triglycerides, and cholesterol. Heart attack and stroke are both medical emergencies. There are many things that you can do to help prevent heart disease and stroke:  Have your blood pressure checked at least every 1-2 years. High blood pressure causes heart disease and increases the risk of stroke.  If you are 82-42 years old, ask your health care provider if you should take aspirin to prevent a heart attack or a stroke.  Do not use any tobacco products, including cigarettes, chewing tobacco, or electronic cigarettes. If you need help quitting, ask your health care provider.  It is important to eat a healthy diet and maintain a healthy weight. ? Be sure to include plenty of vegetables, fruits, low-fat dairy products, and lean protein. ? Avoid eating foods that are high in solid fats,  added sugars, or salt (sodium).  Get regular exercise. This is one of the most important things that you can do for your health. ? Try to exercise for at least 150 minutes each week. The type of exercise that you do should increase your heart rate and make you sweat. This is known as moderate-intensity exercise. ? Try to do strengthening exercises at least  twice each week. Do these in addition to the moderate-intensity exercise.  Know your numbers.Ask your health care provider to check your cholesterol and your blood glucose. Continue to have your blood tested as directed by your health care provider.  What should I know about cancer screening? There are several types of cancer. Take the following steps to reduce your risk and to catch any cancer development as early as possible. Breast Cancer  Practice breast self-awareness. ? This means understanding how your breasts normally appear and feel. ? It also means doing regular breast self-exams. Let your health care provider know about any changes, no matter how small.  If you are 43 or older, have a clinician do a breast exam (clinical breast exam or CBE) every year. Depending on your age, family history, and medical history, it may be recommended that you also have a yearly breast X-ray (mammogram).  If you have a family history of breast cancer, talk with your health care provider about genetic screening.  If you are at high risk for breast cancer, talk with your health care provider about having an MRI and a mammogram every year.  Breast cancer (BRCA) gene test is recommended for women who have family members with BRCA-related cancers. Results of the assessment will determine the need for genetic counseling and BRCA1 and for BRCA2 testing. BRCA-related cancers include these types: ? Breast. This occurs in males or females. ? Ovarian. ? Tubal. This may also be called fallopian tube cancer. ? Cancer of the abdominal or pelvic lining (peritoneal cancer). ? Prostate. ? Pancreatic.  Cervical, Uterine, and Ovarian Cancer Your health care provider may recommend that you be screened regularly for cancer of the pelvic organs. These include your ovaries, uterus, and vagina. This screening involves a pelvic exam, which includes checking for microscopic changes to the surface of your cervix (Pap  test).  For women ages 21-65, health care providers may recommend a pelvic exam and a Pap test every three years. For women ages 1-65, they may recommend the Pap test and pelvic exam, combined with testing for human papilloma virus (HPV), every five years. Some types of HPV increase your risk of cervical cancer. Testing for HPV may also be done on women of any age who have unclear Pap test results.  Other health care providers may not recommend any screening for nonpregnant women who are considered low risk for pelvic cancer and have no symptoms. Ask your health care provider if a screening pelvic exam is right for you.  If you have had past treatment for cervical cancer or a condition that could lead to cancer, you need Pap tests and screening for cancer for at least 20 years after your treatment. If Pap tests have been discontinued for you, your risk factors (such as having a new sexual partner) need to be reassessed to determine if you should start having screenings again. Some women have medical problems that increase the chance of getting cervical cancer. In these cases, your health care provider may recommend that you have screening and Pap tests more often.  If you have a family  history of uterine cancer or ovarian cancer, talk with your health care provider about genetic screening.  If you have vaginal bleeding after reaching menopause, tell your health care provider.  There are currently no reliable tests available to screen for ovarian cancer.  Lung Cancer Lung cancer screening is recommended for adults 59-90 years old who are at high risk for lung cancer because of a history of smoking. A yearly low-dose CT scan of the lungs is recommended if you:  Currently smoke.  Have a history of at least 30 pack-years of smoking and you currently smoke or have quit within the past 15 years. A pack-year is smoking an average of one pack of cigarettes per day for one year.  Yearly screening  should:  Continue until it has been 15 years since you quit.  Stop if you develop a health problem that would prevent you from having lung cancer treatment.  Colorectal Cancer  This type of cancer can be detected and can often be prevented.  Routine colorectal cancer screening usually begins at age 39 and continues through age 44.  If you have risk factors for colon cancer, your health care provider may recommend that you be screened at an earlier age.  If you have a family history of colorectal cancer, talk with your health care provider about genetic screening.  Your health care provider may also recommend using home test kits to check for hidden blood in your stool.  A small camera at the end of a tube can be used to examine your colon directly (sigmoidoscopy or colonoscopy). This is done to check for the earliest forms of colorectal cancer.  Direct examination of the colon should be repeated every 5-10 years until age 61. However, if early forms of precancerous polyps or small growths are found or if you have a family history or genetic risk for colorectal cancer, you may need to be screened more often.  Skin Cancer  Check your skin from head to toe regularly.  Monitor any moles. Be sure to tell your health care provider: ? About any new moles or changes in moles, especially if there is a change in a mole's shape or color. ? If you have a mole that is larger than the size of a pencil eraser.  If any of your family members has a history of skin cancer, especially at a young age, talk with your health care provider about genetic screening.  Always use sunscreen. Apply sunscreen liberally and repeatedly throughout the day.  Whenever you are outside, protect yourself by wearing long sleeves, pants, a wide-brimmed hat, and sunglasses.  What should I know about osteoporosis? Osteoporosis is a condition in which bone destruction happens more quickly than new bone creation. After  menopause, you may be at an increased risk for osteoporosis. To help prevent osteoporosis or the bone fractures that can happen because of osteoporosis, the following is recommended:  If you are 19-3 years old, get at least 1,000 mg of calcium and at least 600 mg of vitamin D per day.  If you are older than age 3 but younger than age 10, get at least 1,200 mg of calcium and at least 600 mg of vitamin D per day.  If you are older than age 74, get at least 1,200 mg of calcium and at least 800 mg of vitamin D per day.  Smoking and excessive alcohol intake increase the risk of osteoporosis. Eat foods that are rich in calcium and vitamin D,  and do weight-bearing exercises several times each week as directed by your health care provider. What should I know about how menopause affects my mental health? Depression may occur at any age, but it is more common as you become older. Common symptoms of depression include:  Low or sad mood.  Changes in sleep patterns.  Changes in appetite or eating patterns.  Feeling an overall lack of motivation or enjoyment of activities that you previously enjoyed.  Frequent crying spells.  Talk with your health care provider if you think that you are experiencing depression. What should I know about immunizations? It is important that you get and maintain your immunizations. These include:  Tetanus, diphtheria, and pertussis (Tdap) booster vaccine.  Influenza every year before the flu season begins.  Pneumonia vaccine.  Shingles vaccine.  Your health care provider may also recommend other immunizations. This information is not intended to replace advice given to you by your health care provider. Make sure you discuss any questions you have with your health care provider. Document Released: 02/18/2005 Document Revised: 07/17/2015 Document Reviewed: 09/30/2014 Elsevier Interactive Patient Education  2018 [employer].

## 2017-11-15 ENCOUNTER — Telehealth: Payer: Self-pay | Admitting: Family Medicine

## 2017-11-15 ENCOUNTER — Ambulatory Visit (INDEPENDENT_AMBULATORY_CARE_PROVIDER_SITE_OTHER): Payer: PPO | Admitting: *Deleted

## 2017-11-15 DIAGNOSIS — F418 Other specified anxiety disorders: Secondary | ICD-10-CM

## 2017-11-15 DIAGNOSIS — I495 Sick sinus syndrome: Secondary | ICD-10-CM | POA: Diagnosis not present

## 2017-11-15 MED ORDER — FLUOXETINE HCL 20 MG PO CAPS: 20.0000 mg | ORAL_CAPSULE | Freq: Every day | ORAL | 3 refills | Status: DC

## 2017-11-15 NOTE — Progress Notes (Signed)
Remote pacemaker transmission.   

## 2017-11-15 NOTE — Telephone Encounter (Signed)
-----   Message from Dennis Bast, RN sent at 11/14/2017  2:00 PM EST ----- Regarding: prozac Pt is currently on prozac daily and Klonopin at bedtime. Pt also sees Terri. Here for AWV today. Reports she feels like her depression is getting worse as her husband's health declines and asked if you might consider increasing her prozac.

## 2017-11-15 NOTE — Telephone Encounter (Signed)
Called her- she is tolerating prozac well but would like to try increasing to 60- she has no SI but is just feeling more down.  She has quite a few 40mg  on hand.  Will add 20 mg to make 60 total- she will let me know how this is working for her

## 2017-11-19 ENCOUNTER — Encounter: Payer: Self-pay | Admitting: Cardiology

## 2017-11-20 ENCOUNTER — Encounter: Payer: Self-pay | Admitting: Family Medicine

## 2017-11-20 ENCOUNTER — Ambulatory Visit (INDEPENDENT_AMBULATORY_CARE_PROVIDER_SITE_OTHER): Payer: PPO | Admitting: Psychology

## 2017-11-20 DIAGNOSIS — F4321 Adjustment disorder with depressed mood: Secondary | ICD-10-CM | POA: Diagnosis not present

## 2017-11-24 ENCOUNTER — Encounter: Payer: Self-pay | Admitting: Family Medicine

## 2017-11-24 ENCOUNTER — Ambulatory Visit (INDEPENDENT_AMBULATORY_CARE_PROVIDER_SITE_OTHER): Payer: PPO | Admitting: Family Medicine

## 2017-11-24 VITALS — BP 110/78 | HR 84 | Temp 98.2°F | Ht 60.0 in | Wt 152.5 lb

## 2017-11-24 DIAGNOSIS — J3089 Other allergic rhinitis: Secondary | ICD-10-CM

## 2017-11-24 MED ORDER — METHYLPREDNISOLONE ACETATE 80 MG/ML IJ SUSP
80.0000 mg | Freq: Once | INTRAMUSCULAR | Status: AC
  Administered 2017-11-24: 80 mg via INTRAMUSCULAR

## 2017-11-24 NOTE — Progress Notes (Signed)
Pre visit review using our clinic review tool, if applicable. No additional management support is needed unless otherwise documented below in the visit note. 

## 2017-11-24 NOTE — Patient Instructions (Addendum)
Continue to push fluids, practice good hand hygiene, and cover your mouth if you cough.  If you start having fevers, shaking or shortness of breath, seek immediate care.  Continue the Flonase and Xyzal.   Let us know if you need anything.

## 2017-11-24 NOTE — Progress Notes (Signed)
Chief Complaint  Patient presents with  . Cough    Theresa Mejia here for URI complaints.  Duration: 5 days  Associated symptoms: sinus congestion, rhinorrhea, chest tightness, myalgia and cough Denies: sinus pain, itchy watery eyes, ear fullness, ear drainage, sore throat, wheezing, shortness of breath and fevers Treatment to date: Hycodan, robitussin, INCS, Tylenol, Mucinex, Xyzal. Sick contacts: No  Was exposed to dust prior to s/s's.   ROS:  Const: Denies fevers HEENT: As noted in HPI Lungs: No SOB  Past Medical History:  Diagnosis Date  . Allergic rhinitis   . Depression   . DJD (degenerative joint disease)   . GERD (gastroesophageal reflux disease)   . Headache(784.0)   . Hypercholesteremia   . Hypersomnia   . Hypertension   . Hypothyroidism   . Internal hemorrhoid 11/2008   s/p banding ([place])  . Lumbar disc disease   . OBSTRUCTIVE SLEEP APNEA    NPSG 04/29/07- AHI 1.1/hr, RDI 21.2/hr. Weight 176 lbs. CPAP was tried based on the RDI.    [name] OVERACTIVE BLADDER   . Presence of permanent cardiac pacemaker 11/11/2015  . Reactive depression (situational)   . Sinus node dysfunction (HCC)   . Spondylosis, cervical     BP 110/78 (BP Location: Left Arm, Patient Position: Sitting, Cuff Size: Normal)   Pulse 84   Temp 98.2 F (36.8 C) (Oral)   Ht 5' (1.524 m)   Wt 152 lb 8 oz (69.2 kg)   SpO2 99%   BMI 29.78 kg/m  General: Awake, alert, appears stated age HEENT: AT, [place], ears patent b/l and TM's neg, nares patent w/o discharge, turbinates edematous pharynx w erythematous cobblestoning and without exudates, MMM Neck: No masses or asymmetry Heart: RRR Lungs: CTAB, no accessory muscle use Psych: Age appropriate judgment and insight, normal mood and affect  Allergic rhinitis due to other allergic trigger, unspecified seasonality  Depo today. Cont INCS and Xyzal.  Continue to push fluids, practice good hand hygiene, cover mouth when coughing. F/u prn. If starting to  experience fevers, shaking, or shortness of breath, seek immediate care. Pt voiced understanding and agreement to the plan.  [place], DO 11/24/17 2:47 PM

## 2017-11-24 NOTE — Addendum Note (Signed)
Addended by: Sharon Seller B on: 11/24/2017 02:55 PM   Modules accepted: Orders

## 2017-12-04 ENCOUNTER — Encounter: Payer: Self-pay | Admitting: Family Medicine

## 2017-12-04 DIAGNOSIS — R0600 Dyspnea, unspecified: Secondary | ICD-10-CM

## 2017-12-04 DIAGNOSIS — R0982 Postnasal drip: Secondary | ICD-10-CM

## 2017-12-04 MED ORDER — IPRATROPIUM BROMIDE 0.03 % NA SOLN: 2.0000 | Freq: Four times a day (QID) | NASAL | 6 refills | Status: DC

## 2017-12-04 NOTE — Addendum Note (Signed)
Addended by: Lamar Blinks C on: 12/04/2017 07:57 PM   Modules accepted: Orders

## 2017-12-26 ENCOUNTER — Ambulatory Visit (INDEPENDENT_AMBULATORY_CARE_PROVIDER_SITE_OTHER): Payer: PPO | Admitting: Psychology

## 2017-12-26 DIAGNOSIS — F4321 Adjustment disorder with depressed mood: Secondary | ICD-10-CM

## 2018-01-03 NOTE — Progress Notes (Addendum)
[facility] [address] [place], [place] [address] [phone]  Date:  01/08/2018   Name:  Theresa Mejia   DOB:  03-29-1940   MRN:  [id number]  PCP:  Darreld Mclean, MD    Chief Complaint: Fatigue (feels the need to sleep, feels "fuzzy headed", several years)   History of Present Illness:  Theresa Mejia is a 77 y.o. very pleasant female patient who presents with the following: History of hypothyroidism, HTN, OSA, pacemaker Here today with concern of fatigue  I had seen her in May with concern of chronic abdominal discomfort for 2 years, we referred her to GI and she was seen in July.  She notes that she has felt tired/ sleepy " all the time" for the previous 20 years.  She notes that prior to this she did not suffer from excessive sleepiness. She reports having a sleep evaluation per Dr. Annamaria Boots about 20 years ago, and she was diagnosed with borderline sleep apnea.  She is CPAP for about 1 year, but then stop as it did not seem to be helping her.  Aria notes that she goes to bed around 9, and falls asleep around 10 PM. She will arise around 8 AM in the morning. She feels that she sleeps fairly well, but does wake up approximately 4-5 times per night to urinate or for other reasons.  She is able to get back to sleep fairly quickly She is using clonazepam as needed for insomnia  Remington also does have depression, and notes her symptoms are somewhat worse recently She describes lack of motivation, and that she might sit in the house all day of left her own devices. Maegan feel that some of her depression symptoms are due to the declining health of her husband.  They are not able to get out and do as many things that used to in the past  We discussed changing from Prozac another medication, and she is open this idea.  She is Wellbutrin in the past, and tolerated okay.  She does not have much anxiety, so Wellbutrin may be  helpful as it can increase energy. She is seeing a Social worker, and is trying to implement the advice that Coralyn Mark is giving her  Lab Results  Component Value Date   TSH 0.85 06/08/2017     Patient Active Problem List   Diagnosis Date Noted  . Orthostatic hypotension 12/06/2016  . Viral URI with cough 04/20/2016  . Sinus node dysfunction ([place]) 11/11/2015  . Routine general medical examination at a health care facility 06/04/2015  . Insomnia with sleep apnea 06/04/2015  . Bronchitis, chronic obstructive w acute bronchitis ([place]) 12/10/2014  . Lumbar disc disease 09/01/2010  . Obstructive sleep apnea 05/31/2007  . Hyperlipidemia with target LDL less than 130 02/05/2007  . Depression with anxiety 02/05/2007  . Allergic rhinitis 02/05/2007  . Hypothyroidism 02/02/2007  . Essential hypertension 02/02/2007    Past Medical History:  Diagnosis Date  . Allergic rhinitis   . Depression   . DJD (degenerative joint disease)   . GERD (gastroesophageal reflux disease)   . Headache(784.0)   . Hypercholesteremia   . Hypersomnia   . Hypertension   . Hypothyroidism   . Internal hemorrhoid 11/2008   s/p banding ([place])  . Lumbar disc disease   . OBSTRUCTIVE SLEEP APNEA    NPSG 04/29/07- AHI 1.1/hr, RDI 21.2/hr. Weight 176 lbs. CPAP was tried based  on the RDI.    [name] OVERACTIVE BLADDER   . Presence of permanent cardiac pacemaker 11/11/2015  . Reactive depression (situational)   . Sinus node dysfunction (HCC)   . Spondylosis, cervical     Past Surgical History:  Procedure Laterality Date  . BIOPSY BREAST    . BREAST EXCISIONAL BIOPSY Left 2011  . EP IMPLANTABLE DEVICE N/A 11/11/2015   Procedure: Pacemaker Implant;  Surgeon: Evans Lance, MD;  Location: [facility];  Service: Cardiovascular;  Laterality: N/A;  . eye lid lift    . INSERT / REPLACE / REMOVE PACEMAKER  11/11/2015  . KNEE ARTHROSCOPY     Dr. [name]  . MOUTH SURGERY  08/25/2017   implant fell out due to bone loss   . rotator cuff surgery     Dr. [name]  . TUBAL LIGATION      Social History   Tobacco Use  . Smoking status: Former Smoker    Years: 8.00    Types: Cigarettes    Last attempt to quit: 01/11/1968    Years since quitting: 50.0  . Smokeless tobacco: Never Used  Substance Use Topics  . Alcohol use: No  . Drug use: No    Family History  Problem Relation Age of Onset  . Crohn's disease Father 35  . Macular degeneration Mother   . Cancer Mother 52       bladder, never confirmed with biospy  . Hypothyroidism Mother   . Tremor Mother   . Stroke Mother 41  . Parkinsonism Maternal Grandmother   . Colon cancer Neg Hx   . Esophageal cancer Neg Hx   . Rectal cancer Neg Hx     Allergies  Allergen Reactions  . Ramipril     REACTION: pt states INTOLERANT to Altace  . Sulfa Antibiotics     Says it dropped her blood pressure really low.   . Sulfonamide Derivatives     REACTION: hypotension    Medication list has been reviewed and updated.  Current Outpatient Medications on File Prior to Visit  Medication Sig Dispense Refill  . amLODipine (NORVASC) 5 MG tablet Take 1 tablet (5 mg total) by mouth daily. 90 tablet 2  . clonazePAM (KLONOPIN) 0.5 MG tablet Take 1/2 at bedtime as needed for insomnia, may increase to a whole tablet if needed 30 tablet 0  . FLUoxetine (PROZAC) 20 MG capsule Take 1 capsule (20 mg total) by mouth daily. Add to 40 mg to make 60 mg total 90 capsule 3  . FLUoxetine (PROZAC) 40 MG capsule TAKE 1 CAPSULE BY MOUTH EVERY DAY 90 capsule 1  . fluticasone (FLONASE) 50 MCG/ACT nasal spray SPRAY 2 SPRAYS INTO EACH NOSTRIL EVERY DAY 48 g 1  . ipratropium (ATROVENT) 0.03 % nasal spray Place 2 sprays into the nose 4 (four) times daily. Use as needed for post naal drip 30 mL 6  . levocetirizine (XYZAL) 5 MG tablet Take 1 tablet (5 mg total) by mouth every evening. 90 tablet 3  . levothyroxine (SYNTHROID, LEVOTHROID) 125 MCG tablet Take 0.5 tablets (62.5 mcg total) by mouth  daily. 90 tablet 1  . telmisartan (MICARDIS) 80 MG tablet Take 1 tablet (80 mg total) by mouth daily. 30 tablet 1   No current facility-administered medications on file prior to visit.     Review of Systems:  As per HPI- otherwise negative. Denies any suicidal ideation. No fever or chill. No chest pain or shortness of breath   Physical  Examination: Vitals:   01/08/18 1005  BP: 120/82  Pulse: 79  Resp: 16  SpO2: 98%   Vitals:   01/08/18 1005  Weight: 150 lb (68 kg)  Height: 5' (1.524 m)   Body mass index is 29.29 kg/m. Ideal Body Weight: Weight in (lb) to have BMI = 25: 127.7  GEN: WDWN, NAD, Non-toxic, A & O x 3, looks well, mild overweight  HEENT: Atraumatic, Normocephalic. Neck supple. No masses, No LAD.  Wearing hearing aids  She does not have an obese neck, but has a small posterior oropharynx which could predispose her to sleep apnea Ears and Nose: No external deformity. CV: RRR, No M/G/R. No JVD. No thrill. No extra heart sounds. PULM: CTA B, no wheezes, crackles, rhonchi. No retractions. No resp. distress. No accessory muscle use. ABD: S, NT, ND, +BS. No rebound. No HSM. EXTR: No c/c/e NEURO Normal gait.  PSYCH: Normally interactive. Conversant. Not depressed or anxious appearing.  Calm demeanor.    Assessment and Plan: Depression, recurrent ([place]) - Plan: buPROPion (WELLBUTRIN SR) 150 MG 12 hr tablet  Exhaustion - Plan: Ambulatory referral to Neurology, CBC  Obstructive sleep apnea  Hypothyroidism (acquired) - Plan: TSH  Carolann is here today with concern of excessive fatigue. She has noticed this for about 20 years per her report.  She does report being tested for sleep apnea about 20 years ago, and tried CPAP for about 1 year.  However she did not feel like this was helpful and stopped using it There is also an element of depression, perhaps related to her husband's declining health. She does not have much in the way of anxiety. We will try tapering  her off of Prozac and start her on Wellbutrin instead- see patient instructions for detailed taper instructions. She is seeing a Social worker, and denies any suicidal ideation. Also refer back to neurology for sleep evaluation. Check thyroid today  Signed Lamar Blinks, MD  Received her labs  Results for orders placed or performed in visit on 01/08/18  TSH  Result Value Ref Range   TSH 1.01 0.35 - 4.50 uIU/mL  CBC  Result Value Ref Range   WBC 5.0 4.0 - 10.5 K/uL   RBC 4.05 3.87 - 5.11 Mil/uL   Platelets 290.0 150.0 - 400.0 K/uL   Hemoglobin 12.1 12.0 - 15.0 g/dL   HCT 35.8 (L) 36.0 - 46.0 %   MCV 88.3 78.0 - 100.0 fl   MCHC 33.9 30.0 - 36.0 g/dL   RDW 14.0 11.5 - 15.5 %   Message to pt

## 2018-01-08 ENCOUNTER — Encounter: Payer: Self-pay | Admitting: Family Medicine

## 2018-01-08 ENCOUNTER — Ambulatory Visit (INDEPENDENT_AMBULATORY_CARE_PROVIDER_SITE_OTHER): Payer: PPO | Admitting: Family Medicine

## 2018-01-08 VITALS — BP 120/82 | HR 79 | Resp 16 | Ht 60.0 in | Wt 150.0 lb

## 2018-01-08 DIAGNOSIS — F339 Major depressive disorder, recurrent, unspecified: Secondary | ICD-10-CM

## 2018-01-08 DIAGNOSIS — R5383 Other fatigue: Secondary | ICD-10-CM | POA: Diagnosis not present

## 2018-01-08 DIAGNOSIS — E039 Hypothyroidism, unspecified: Secondary | ICD-10-CM

## 2018-01-08 DIAGNOSIS — G4733 Obstructive sleep apnea (adult) (pediatric): Secondary | ICD-10-CM

## 2018-01-08 LAB — TSH: TSH: 1.01 u[IU]/mL (ref 0.35–4.50)

## 2018-01-08 LAB — CBC
HCT: 35.8 % — ABNORMAL LOW (ref 36.0–46.0)
HEMOGLOBIN: 12.1 g/dL (ref 12.0–15.0)
MCHC: 33.9 g/dL (ref 30.0–36.0)
MCV: 88.3 fl (ref 78.0–100.0)
Platelets: 290 10*3/uL (ref 150.0–400.0)
RBC: 4.05 Mil/uL (ref 3.87–5.11)
RDW: 14 % (ref 11.5–15.5)
WBC: 5 10*3/uL (ref 4.0–10.5)

## 2018-01-08 MED ORDER — BUPROPION HCL ER (SR) 150 MG PO TB12: 150.0000 mg | ORAL_TABLET | Freq: Two times a day (BID) | ORAL | 5 refills | Status: DC

## 2018-01-08 NOTE — Patient Instructions (Signed)
It was good to see you today, but I am sorry you are having a hard time. I will set you up with a neurology appointment, to discuss your sleep issues.  I suspect they want to do a sleep study.  If he has sleep apnea, this can be treated and will likely improve your symptoms.  If you do not have sleep apnea, there are medications that may be helpful. Let us try changing from Prozac to Wellbutrin, as this may improve your fatigue.  Take Prozac 20 mg daily for 1 week.  Then stop using the Prozac, and start on Wellbutrin.  The Wellbutrin is twice a day, but take it just once a day for the first 3 days. We will also check your thyroid today  Please keep you posted on how you are doing

## 2018-01-12 ENCOUNTER — Encounter: Payer: Self-pay | Admitting: Family Medicine

## 2018-01-12 NOTE — Telephone Encounter (Signed)
Gwen can you check on this? I see where it is authorized can patient call and make an appointment or will they call her?

## 2018-01-16 ENCOUNTER — Other Ambulatory Visit: Payer: Self-pay | Admitting: Family Medicine

## 2018-01-20 LAB — CUP PACEART REMOTE DEVICE CHECK
Date Time Interrogation Session: 20200111102901
Implantable Lead Location: 753859
Implantable Lead Location: 753860
Implantable Lead Serial Number: 49553678
MDC IDC LEAD IMPLANT DT: 20171101
MDC IDC LEAD IMPLANT DT: 20171101
MDC IDC LEAD SERIAL: 49617393
MDC IDC PG IMPLANT DT: 20171101
MDC IDC PG SERIAL: 68817609
Pulse Gen Model: 394969

## 2018-01-25 ENCOUNTER — Encounter: Payer: Self-pay | Admitting: Family Medicine

## 2018-01-27 ENCOUNTER — Other Ambulatory Visit: Payer: Self-pay | Admitting: Family Medicine

## 2018-01-27 DIAGNOSIS — F418 Other specified anxiety disorders: Secondary | ICD-10-CM

## 2018-02-06 ENCOUNTER — Ambulatory Visit (INDEPENDENT_AMBULATORY_CARE_PROVIDER_SITE_OTHER): Payer: PPO | Admitting: Psychology

## 2018-02-06 DIAGNOSIS — F4321 Adjustment disorder with depressed mood: Secondary | ICD-10-CM | POA: Diagnosis not present

## 2018-02-14 ENCOUNTER — Ambulatory Visit (INDEPENDENT_AMBULATORY_CARE_PROVIDER_SITE_OTHER): Payer: PPO

## 2018-02-14 DIAGNOSIS — R002 Palpitations: Secondary | ICD-10-CM

## 2018-02-14 DIAGNOSIS — I495 Sick sinus syndrome: Secondary | ICD-10-CM

## 2018-02-16 LAB — CUP PACEART REMOTE DEVICE CHECK
Date Time Interrogation Session: 20200207111619
Implantable Lead Implant Date: 20171101
Implantable Lead Implant Date: 20171101
Implantable Lead Location: 753859
Implantable Lead Model: 377
Implantable Lead Serial Number: 49553678
Implantable Pulse Generator Implant Date: 20171101
MDC IDC LEAD LOCATION: 753860
MDC IDC LEAD SERIAL: 49617393
Pulse Gen Model: 394969
Pulse Gen Serial Number: 68817609

## 2018-02-23 NOTE — Progress Notes (Signed)
Remote pacemaker transmission.   

## 2018-02-27 ENCOUNTER — Other Ambulatory Visit: Payer: Self-pay | Admitting: Family Medicine

## 2018-02-27 DIAGNOSIS — F5101 Primary insomnia: Secondary | ICD-10-CM

## 2018-02-28 MED ORDER — CLONAZEPAM 0.5 MG PO TABS: ORAL_TABLET | ORAL | 0 refills | Status: DC

## 2018-03-01 ENCOUNTER — Institutional Professional Consult (permissible substitution): Payer: Self-pay | Admitting: Neurology

## 2018-03-04 ENCOUNTER — Encounter: Payer: Self-pay | Admitting: Family Medicine

## 2018-03-06 ENCOUNTER — Ambulatory Visit: Payer: PPO | Admitting: Psychology

## 2018-03-06 ENCOUNTER — Other Ambulatory Visit: Payer: Self-pay | Admitting: Family Medicine

## 2018-03-06 ENCOUNTER — Encounter: Payer: Self-pay | Admitting: Family Medicine

## 2018-03-06 ENCOUNTER — Ambulatory Visit (INDEPENDENT_AMBULATORY_CARE_PROVIDER_SITE_OTHER): Payer: PPO | Admitting: Family Medicine

## 2018-03-06 VITALS — BP 116/76 | HR 74 | Temp 98.4°F | Resp 16 | Ht 60.0 in | Wt 152.6 lb

## 2018-03-06 DIAGNOSIS — R059 Cough, unspecified: Secondary | ICD-10-CM | POA: Insufficient documentation

## 2018-03-06 DIAGNOSIS — R05 Cough: Secondary | ICD-10-CM | POA: Diagnosis not present

## 2018-03-06 DIAGNOSIS — J324 Chronic pansinusitis: Secondary | ICD-10-CM | POA: Diagnosis not present

## 2018-03-06 MED ORDER — AMOXICILLIN-POT CLAVULANATE 875-125 MG PO TABS: 1.0000 | ORAL_TABLET | Freq: Two times a day (BID) | ORAL | 0 refills | Status: DC

## 2018-03-06 MED ORDER — PROMETHAZINE-DM 6.25-15 MG/5ML PO SYRP: 5.0000 mL | ORAL_SOLUTION | Freq: Four times a day (QID) | ORAL | 0 refills | Status: DC | PRN

## 2018-03-06 NOTE — Patient Instructions (Signed)
Sinusitis, Adult  Sinusitis is inflammation of your sinuses. Sinuses are hollow spaces in the bones around your face. Your sinuses are located:   Around your eyes.   In the middle of your forehead.   Behind your nose.   In your cheekbones.  Mucus normally drains out of your sinuses. When your nasal tissues become inflamed or swollen, mucus can become trapped or blocked. This allows bacteria, viruses, and fungi to grow, which leads to infection. Most infections of the sinuses are caused by a virus.  Sinusitis can develop quickly. It can last for up to 4 weeks (acute) or for more than 12 weeks (chronic). Sinusitis often develops after a cold.  What are the causes?  This condition is caused by anything that creates swelling in the sinuses or stops mucus from draining. This includes:   Allergies.   Asthma.   Infection from bacteria or viruses.   Deformities or blockages in your nose or sinuses.   Abnormal growths in the nose (nasal polyps).   Pollutants, such as chemicals or irritants in the air.   Infection from fungi (rare).  What increases the risk?  You are more likely to develop this condition if you:   Have a weak body defense system (immune system).   Do a lot of swimming or diving.   Overuse nasal sprays.   Smoke.  What are the signs or symptoms?  The main symptoms of this condition are pain and a feeling of pressure around the affected sinuses. Other symptoms include:   Stuffy nose or congestion.   Thick drainage from your nose.   Swelling and warmth over the affected sinuses.   Headache.   Upper toothache.   A cough that may get worse at night.   Extra mucus that collects in the throat or the back of the nose (postnasal drip).   Decreased sense of smell and taste.   Fatigue.   A fever.   Sore throat.   Bad breath.  How is this diagnosed?  This condition is diagnosed based on:   Your symptoms.   Your medical history.   A physical exam.   Tests to find out if your condition is  acute or chronic. This may include:  ? Checking your nose for nasal polyps.  ? Viewing your sinuses using a device that has a light (endoscope).  ? Testing for allergies or bacteria.  ? Imaging tests, such as an MRI or CT scan.  In rare cases, a bone biopsy may be done to rule out more serious types of fungal sinus disease.  How is this treated?  Treatment for sinusitis depends on the cause and whether your condition is chronic or acute.   If caused by a virus, your symptoms should go away on their own within 10 days. You may be given medicines to relieve symptoms. They include:  ? Medicines that shrink swollen nasal passages (topical intranasal decongestants).  ? Medicines that treat allergies (antihistamines).  ? A spray that eases inflammation of the nostrils (topical intranasal corticosteroids).  ? Rinses that help get rid of thick mucus in your nose (nasal saline washes).   If caused by bacteria, your health care provider may recommend waiting to see if your symptoms improve. Most bacterial infections will get better without antibiotic medicine. You may be given antibiotics if you have:  ? A severe infection.  ? A weak immune system.   If caused by narrow nasal passages or nasal polyps, you may need   to have surgery.  Follow these instructions at home:  Medicines   Take, use, or apply over-the-counter and prescription medicines only as told by your health care provider. These may include nasal sprays.   If you were prescribed an antibiotic medicine, take it as told by your health care provider. Do not stop taking the antibiotic even if you start to feel better.  Hydrate and humidify     Drink enough fluid to keep your urine pale yellow. Staying hydrated will help to thin your mucus.   Use a cool mist humidifier to keep the humidity level in your home above 50%.   Inhale steam for 10-15 minutes, 3-4 times a day, or as told by your health care provider. You can do this in the bathroom while a hot shower is  running.   Limit your exposure to cool or dry air.  Rest   Rest as much as possible.   Sleep with your head raised (elevated).   Make sure you get enough sleep each night.  General instructions     Apply a warm, moist washcloth to your face 3-4 times a day or as told by your health care provider. This will help with discomfort.   Wash your hands often with soap and water to reduce your exposure to germs. If soap and water are not available, use hand sanitizer.   Do not smoke. Avoid being around people who are smoking (secondhand smoke).   Keep all follow-up visits as told by your health care provider. This is important.  Contact a health care provider if:   You have a fever.   Your symptoms get worse.   Your symptoms do not improve within 10 days.  Get help right away if:   You have a severe headache.   You have persistent vomiting.   You have severe pain or swelling around your face or eyes.   You have vision problems.   You develop confusion.   Your neck is stiff.   You have trouble breathing.  Summary   Sinusitis is soreness and inflammation of your sinuses. Sinuses are hollow spaces in the bones around your face.   This condition is caused by nasal tissues that become inflamed or swollen. The swelling traps or blocks the flow of mucus. This allows bacteria, viruses, and fungi to grow, which leads to infection.   If you were prescribed an antibiotic medicine, take it as told by your health care provider. Do not stop taking the antibiotic even if you start to feel better.   Keep all follow-up visits as told by your health care provider. This is important.  This information is not intended to replace advice given to you by your health care provider. Make sure you discuss any questions you have with your health care provider.  Document Released: 12/27/2004 Document Revised: 05/29/2017 Document Reviewed: 05/29/2017  [employer]vier Interactive Patient Education  2019 [employer]

## 2018-03-06 NOTE — Assessment & Plan Note (Signed)
abx per orders con't flonase and xyzal  Cough med per orders rto prn

## 2018-03-06 NOTE — Progress Notes (Signed)
Patient ID: Theresa Mejia, female    DOB: [birth year]  Age: 78 y.o. MRN: [id number]    Subjective:  Subjective  HPI Theresa Mejia presents for  Sinus congestion / pressure x 1 week  She is taking her xyzal and flonase and is using delsym for a cough Review of Systems  Constitutional: Positive for chills. Negative for fever.  HENT: Positive for congestion, postnasal drip, rhinorrhea, sinus pressure and sinus pain.   Respiratory: Positive for cough. Negative for chest tightness, shortness of breath and wheezing.   Cardiovascular: Negative for chest pain, palpitations and leg swelling.  Allergic/Immunologic: Negative for environmental allergies.    History Past Medical History:  Diagnosis Date  . Allergic rhinitis   . Depression   . DJD (degenerative joint disease)   . GERD (gastroesophageal reflux disease)   . Headache(784.0)   . Hypercholesteremia   . Hypersomnia   . Hypertension   . Hypothyroidism   . Internal hemorrhoid 11/2008   s/p banding ([place])  . Lumbar disc disease   . OBSTRUCTIVE SLEEP APNEA    NPSG 04/29/07- AHI 1.1/hr, RDI 21.2/hr. Weight 176 lbs. CPAP was tried based on the RDI.    [name] OVERACTIVE BLADDER   . Presence of permanent cardiac pacemaker 11/11/2015  . Reactive depression (situational)   . Sinus node dysfunction (HCC)   . Spondylosis, cervical     She has a past surgical history that includes rotator cuff surgery; Knee arthroscopy; Tubal ligation; Cardiac catheterization (N/A, 11/11/2015); Insert / replace / remove pacemaker (11/11/2015); eye lid lift; Biopsy breast; Breast excisional biopsy (Left, 2011); and Mouth surgery (08/25/2017).   Her family history includes Cancer (age of onset: 7) in her mother; Crohn's disease (age of onset: 28) in her father; Hypothyroidism in her mother; Macular degeneration in her mother; Parkinsonism in her maternal grandmother; Stroke (age of onset: 39) in her mother; Tremor in her mother.She reports that she quit smoking  about 50 years ago. Her smoking use included cigarettes. She quit after 8.00 years of use. She has never used smokeless tobacco. She reports that she does not drink alcohol or use drugs.  Current Outpatient Medications on File Prior to Visit  Medication Sig Dispense Refill  . amLODipine (NORVASC) 5 MG tablet Take 1 tablet (5 mg total) by mouth daily. 90 tablet 2  . buPROPion (WELLBUTRIN SR) 150 MG 12 hr tablet Take 1 tablet (150 mg total) by mouth 2 (two) times daily. 60 tablet 5  . Cholecalciferol (VITAMIN D3 PO) Take 1 capsule by mouth daily.    . clonazePAM (KLONOPIN) 0.5 MG tablet Take 1/2 at bedtime as needed for insomnia, may increase to a whole tablet if needed 30 tablet 0  . fluticasone (FLONASE) 50 MCG/ACT nasal spray SPRAY 2 SPRAYS INTO EACH NOSTRIL EVERY DAY 48 g 1  . levocetirizine (XYZAL) 5 MG tablet Take 1 tablet (5 mg total) by mouth every evening. 90 tablet 3  . levothyroxine (SYNTHROID, LEVOTHROID) 125 MCG tablet Take 0.5 tablets (62.5 mcg total) by mouth daily. 90 tablet 1  . telmisartan (MICARDIS) 80 MG tablet TAKE 1 TABLET BY MOUTH EVERY DAY 90 tablet 3   No current facility-administered medications on file prior to visit.      Objective:  Objective  Physical Exam Vitals signs and nursing note reviewed.  Constitutional:      Appearance: She is well-developed. She is not diaphoretic.  HENT:     Head: Normocephalic and atraumatic.     Nose: Mucosal edema  and rhinorrhea present. No nasal deformity.     Right Sinus: Maxillary sinus tenderness and frontal sinus tenderness present.     Left Sinus: Maxillary sinus tenderness and frontal sinus tenderness present.     Mouth/Throat:     Pharynx: No oropharyngeal exudate.  Eyes:     Conjunctiva/sclera: Conjunctivae normal.  Neck:     Musculoskeletal: Normal range of motion and neck supple.     Thyroid: No thyromegaly.     Vascular: No carotid bruit or JVD.  Cardiovascular:     Rate and Rhythm: Normal rate and regular  rhythm.     Heart sounds: Normal heart sounds. No murmur.  Pulmonary:     Effort: Pulmonary effort is normal. No respiratory distress.     Breath sounds: Normal breath sounds. No wheezing or rales.  Chest:     Chest wall: No tenderness.  Lymphadenopathy:     Cervical: No cervical adenopathy.  Skin:    General: Skin is warm.  Neurological:     Mental Status: She is alert and oriented to person, place, and time.    BP 116/76 (BP Location: Right Arm, Cuff Size: Large)   Pulse 74   Temp 98.4 F (36.9 C) (Oral)   Resp 16   Ht 5' (1.524 m)   Wt 152 lb 9.6 oz (69.2 kg)   SpO2 96%   BMI 29.80 kg/m  Wt Readings from Last 3 Encounters:  03/06/18 152 lb 9.6 oz (69.2 kg)  01/08/18 150 lb (68 kg)  11/24/17 152 lb 8 oz (69.2 kg)     Lab Results  Component Value Date   WBC 5.0 01/08/2018   HGB 12.1 01/08/2018   HCT 35.8 (L) 01/08/2018   PLT 290.0 01/08/2018   GLUCOSE 84 06/08/2017   CHOL 194 11/03/2016   TRIG 129.0 11/03/2016   HDL 35.70 (L) 11/03/2016   LDLDIRECT 148.3 09/01/2010   LDLCALC 132 (H) 11/03/2016   ALT 15 06/08/2017   AST 17 06/08/2017   NA 138 06/08/2017   K 3.6 06/08/2017   CL 101 06/08/2017   CREATININE 0.75 06/08/2017   BUN 16 06/08/2017   CO2 29 06/08/2017   TSH 1.01 01/08/2018   HGBA1C 5.5 10/15/2012    Mm 3d Screen Breast Bilateral  Result Date: 05/18/2017 CLINICAL DATA:  Screening. Prior high risk excisional biopsy of the LEFT breast in 2011 for a radial scar. EXAM: DIGITAL SCREENING BILATERAL MAMMOGRAM WITH TOMO AND CAD COMPARISON:  Previous exam(s). ACR Breast Density Category b: There are scattered areas of fibroglandular density. FINDINGS: There are no findings suspicious for malignancy. Images were processed with CAD. IMPRESSION: No mammographic evidence of malignancy. A result letter of this screening mammogram will be mailed directly to the patient. RECOMMENDATION: Screening mammogram in one year. (Code:SM-B-01Y) BI-RADS CATEGORY  2: Benign.  Electronically Signed   By: Evangeline Dakin M.D.   On: 05/18/2017 09:55     Assessment & Plan:  Plan  I have discontinued Ryliee Figge. Sprong's FLUoxetine, ipratropium, and FLUoxetine. I am also having her start on amoxicillin-clavulanate and promethazine-dextromethorphan. Additionally, I am having her maintain her amLODipine, levocetirizine, fluticasone, levothyroxine, buPROPion, telmisartan, clonazePAM, and Cholecalciferol (VITAMIN D3 PO).  Meds ordered this encounter  Medications  . amoxicillin-clavulanate (AUGMENTIN) 875-125 MG tablet    Sig: Take 1 tablet by mouth 2 (two) times daily.    Dispense:  20 tablet    Refill:  0  . promethazine-dextromethorphan (PROMETHAZINE-DM) 6.25-15 MG/5ML syrup    Sig: Take 5 mLs  by mouth 4 (four) times daily as needed.    Dispense:  118 mL    Refill:  0    Problem List Items Addressed This Visit      Unprioritized   Cough   Relevant Medications   promethazine-dextromethorphan (PROMETHAZINE-DM) 6.25-15 MG/5ML syrup   Pansinusitis - Primary    abx per orders con't flonase and xyzal  Cough med per orders rto prn       Relevant Medications   amoxicillin-clavulanate (AUGMENTIN) 875-125 MG tablet   promethazine-dextromethorphan (PROMETHAZINE-DM) 6.25-15 MG/5ML syrup      Follow-up: No follow-ups on file.  Ann Held, DO

## 2018-03-07 ENCOUNTER — Ambulatory Visit: Payer: PPO | Admitting: Neurology

## 2018-03-07 ENCOUNTER — Encounter: Payer: Self-pay | Admitting: Neurology

## 2018-03-07 VITALS — BP 120/75 | HR 76 | Ht 60.0 in | Wt 153.0 lb

## 2018-03-07 DIAGNOSIS — J440 Chronic obstructive pulmonary disease with acute lower respiratory infection: Secondary | ICD-10-CM

## 2018-03-07 DIAGNOSIS — M519 Unspecified thoracic, thoracolumbar and lumbosacral intervertebral disc disorder: Secondary | ICD-10-CM

## 2018-03-07 DIAGNOSIS — I495 Sick sinus syndrome: Secondary | ICD-10-CM | POA: Diagnosis not present

## 2018-03-07 DIAGNOSIS — G4700 Insomnia, unspecified: Secondary | ICD-10-CM | POA: Diagnosis not present

## 2018-03-07 DIAGNOSIS — G4730 Sleep apnea, unspecified: Secondary | ICD-10-CM | POA: Diagnosis not present

## 2018-03-07 DIAGNOSIS — I951 Orthostatic hypotension: Secondary | ICD-10-CM

## 2018-03-07 DIAGNOSIS — E785 Hyperlipidemia, unspecified: Secondary | ICD-10-CM

## 2018-03-07 DIAGNOSIS — Z950 Presence of cardiac pacemaker: Secondary | ICD-10-CM

## 2018-03-07 MED ORDER — BENZONATATE 200 MG PO CAPS: 200.0000 mg | ORAL_CAPSULE | Freq: Three times a day (TID) | ORAL | 0 refills | Status: DC | PRN

## 2018-03-07 NOTE — Progress Notes (Signed)
SLEEP MEDICINE CLINIC   Provider:  Larey Seat, MD   Primary Care Physician:  Darreld Mclean, MD   Referring Provider: Darreld Mclean, MD   Chief Complaint  Patient presents with  . New Patient (Initial Visit)    pt alone, rm . pt has been having poor sleep for several yrs, she wakes up many times during the night. she states usually she is able to go back to sleep. complains of fatigue during the day a lot and in a fog. 20 yrs ago had a sleep study with dr Baird Lyons, MD- and at that time boderline apnea , she started CPAP for yr and saw no difference in her sleeping and waking up.  Pt. wanted to also memory she has noticed memory concerns as well as a tremor in both hands that is new.    HPI:  Theresa Mejia is a 78 y.o. female patient , seen here as in a referral  from Dr. Lorelei Pont for a new evaluation of possible sleep apnea.   [name] has not felt rested and refreshed for 2 decades at least.  She has many times mentioning this to primary care and specialty care physicians.  She has also heard that there are many new treatments out should she be diagnosed with obstructive sleep apnea. Mrs. Muro reports that over 20 years ago she had a sleep study under the guidance of Dr. Baird Lyons who found borderline apnea and gave her the option to try CPAP.  She felt that CPAP use rather cumbersome it did not hurt her but it did not change her fragmented sleep or her non-restful sleep pattern.  She discontinued after a while.  In the interval of 20 years she has developed other co-morbidities:  She now has a pacemaker, due to bradycardia and near syncope. She carries a diagnosis of hypothyroidism, was treated by Dr. Lenna Gilford at the time.   Sleep habits are as follows: dinner time 6 PM- dinner without alcohol, bedtime is 9 PM. She no longer shares the room with her husband, she sleeps in the guest room- she  reads in bed, the room is cool and quiet- but not dark- she has a lot of  dormant electronics in the bedroom.sleeps on her side, wakes up prone. Bed is raised by 12 degrees,1 pillow. Nocturia times 1-2. Dreams often but doesn't remember the dreams.  Not longer snoring- wakes up " fine' but fatigues quickly,    Sleep medical history; frequent sinuitis. fatigue, non restful, non refreshed.   Family sleep history: 2 bothers , none of them has apnea.    Social history: married, retired from [employer].  2 adult children, 2 grandchildren. 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 ETOH socially, Caffeine: 50/50 coffee in AM and one cup at lunch, 1 soda a day.    Review of Systems: Out of a complete 14 system review, the patient complains of only the following symptoms, and all other reviewed systems are negative.  Fatigue.  Dry mouth.  Epworth score : 7/ 24 points   , Fatigue severity score; 42/ 63 (!)  , depression score 4/ 15    Social History   Socioeconomic History  . Marital status: Married    Spouse name: Not on file  . Number of children: 2  . Years of education: Not on file  . Highest education level: Not on file  Occupational History  . Occupation: IT consultant  Employer: [employer]    Comment: data entry  Social Needs  . Financial resource strain: Not on file  . Food insecurity:    Worry: Not on file    Inability: Not on file  . Transportation needs:    Medical: Not on file    Non-medical: Not on file  Tobacco Use  . Smoking status: Former Smoker    Years: 8.00    Types: Cigarettes    Last attempt to quit: 01/10/1966    Years since quitting: 52.1  . Smokeless tobacco: Never Used  Substance and Sexual Activity  . Alcohol use: No  . Drug use: No  . Sexual activity: Never  Lifestyle  . Physical activity:    Days per week: Not on file    Minutes per session: Not on file  . Stress: Not on file  Relationships  . Social connections:    Talks on phone: Not on file    Gets together: Not on file     Attends religious service: Not on file    Active member of club or organization: Not on file    Attends meetings of clubs or organizations: Not on file    Relationship status: Not on file  . Intimate partner violence:    Fear of current or ex partner: Not on file    Emotionally abused: Not on file    Physically abused: Not on file    Forced sexual activity: Not on file  Other Topics Concern  . Not on file  Social History Narrative   married, lives with spouse -   Retired 05/2013    Family History  Problem Relation Age of Onset  . Crohn's disease Father 56  . Macular degeneration Mother   . Cancer Mother 61       bladder, never confirmed with biospy  . Hypothyroidism Mother   . Tremor Mother   . Stroke Mother 72  . Parkinsonism Maternal Grandmother   . Alzheimer's disease Paternal Aunt   . Alzheimer's disease Paternal Uncle   . Diabetes Paternal Grandmother   . Colon cancer Neg Hx   . Esophageal cancer Neg Hx   . Rectal cancer Neg Hx     Past Medical History:  Diagnosis Date  . Allergic rhinitis   . Depression   . DJD (degenerative joint disease)   . GERD (gastroesophageal reflux disease)   . Headache(784.0)   . Hypercholesteremia   . Hypersomnia   . Hypertension   . Hypothyroidism   . Internal hemorrhoid 11/2008   s/p banding ([place])  . Lumbar disc disease   . OBSTRUCTIVE SLEEP APNEA    NPSG 04/29/07- AHI 1.1/hr, RDI 21.2/hr. Weight 176 lbs. CPAP was tried based on the RDI.    [name] OVERACTIVE BLADDER   . Presence of permanent cardiac pacemaker 11/11/2015  . Reactive depression (situational)   . Sinus node dysfunction (HCC)   . Spondylosis, cervical     Past Surgical History:  Procedure Laterality Date  . BIOPSY BREAST    . BREAST EXCISIONAL BIOPSY Left 2011  . EP IMPLANTABLE DEVICE N/A 11/11/2015   Procedure: Pacemaker Implant;  Surgeon: Evans Lance, MD;  Location: [facility];  Service: Cardiovascular;  Laterality: N/A;  . eye lid lift    . INSERT  / REPLACE / REMOVE PACEMAKER  11/11/2015  . KNEE ARTHROSCOPY     Dr. [name]  . MOUTH SURGERY  08/25/2017   implant fell out due to bone loss  .  rotator cuff surgery     Dr. [name]  . TUBAL LIGATION      Current Outpatient Medications  Medication Sig Dispense Refill  . amLODipine (NORVASC) 5 MG tablet Take 1 tablet (5 mg total) by mouth daily. 90 tablet 2  . amoxicillin-clavulanate (AUGMENTIN) 875-125 MG tablet Take 1 tablet by mouth 2 (two) times daily. 20 tablet 0  . buPROPion (WELLBUTRIN SR) 150 MG 12 hr tablet Take 1 tablet (150 mg total) by mouth 2 (two) times daily. 60 tablet 5  . Cholecalciferol (VITAMIN D3 PO) Take 1 capsule by mouth daily.    . clonazePAM (KLONOPIN) 0.5 MG tablet Take 1/2 at bedtime as needed for insomnia, may increase to a whole tablet if needed 30 tablet 0  . fluticasone (FLONASE) 50 MCG/ACT nasal spray SPRAY 2 SPRAYS INTO EACH NOSTRIL EVERY DAY 48 g 1  . levocetirizine (XYZAL) 5 MG tablet Take 1 tablet (5 mg total) by mouth every evening. 90 tablet 3  . levothyroxine (SYNTHROID, LEVOTHROID) 125 MCG tablet Take 0.5 tablets (62.5 mcg total) by mouth daily. 90 tablet 1  . promethazine-dextromethorphan (PROMETHAZINE-DM) 6.25-15 MG/5ML syrup Take 5 mLs by mouth 4 (four) times daily as needed. 118 mL 0  . telmisartan (MICARDIS) 80 MG tablet TAKE 1 TABLET BY MOUTH EVERY DAY 90 tablet 3   No current facility-administered medications for this visit.     Allergies as of 03/07/2018 - Review Complete 03/07/2018  Allergen Reaction Noted  . Ramipril    . Sulfa antibiotics  10/19/2015  . Sulfonamide derivatives      Vitals: BP 120/75   Pulse 76   Ht 5' (1.524 m)   Wt 153 lb (69.4 kg)   BMI 29.88 kg/m  Last Weight:  Wt Readings from Last 1 Encounters:  03/07/18 153 lb (69.4 kg)   NLG:XQJJ mass index is 29.88 kg/m.     Last Height:   Ht Readings from Last 1 Encounters:  03/07/18 5' (1.524 m)    Physical exam:  General: The patient is awake, alert and  appears not in acute distress. The patient is well groomed. Head: Normocephalic, atraumatic. Neck is supple. Mallampati 4,  neck circumference:15". Nasal airflow congested ,  Retrognathia is not seen.  No Dentures, but implant.   Cardiovascular:  Paced Regular rate and rhythm, without  murmurs or carotid bruit, and without distended neck veins. Respiratory: Lungs are clear to auscultation. Skin:  Without evidence of edema, or rash Trunk: BMI is 29.8 kg/m2. The patient's posture is mildly stooped.   Neurologic exam : The patient is awake and alert, oriented to place and time.     MOCA:No flowsheet data found. MMSE: MMSE - Mini Mental State Exam 11/03/2016  Orientation to time 5  Orientation to Place 5  Registration 3  Attention/ Calculation 5  Recall 3  Language- name 2 objects 2  Language- repeat 1  Language- follow 3 step command 3  Language- read & follow direction 1  Write a sentence 1  Copy design 1  Total score 30       Attention span & concentration ability appears normal.  Speech is fluent,  without  Dysarthria,but  dysphonia or aphasia.  Mood and affect are appropriate.  Cranial nerves: Pupils are equal and briskly reactive to light. Funduscopic exam without  evidence of pallor or edema. Status post cataract removal.  Extraocular movements in vertical and horizontal planes intact and without nystagmus. Ptosis bilaterally, left worse. Status post Blepharoplasty-  Visual fields by  finger perimetry are intact. Hearing to tuning fork and finger rub intact.   Facial sensation intact to fine touch.  Facial motor strength is symmetric and tongue and uvula move midline. Shoulder shrug was symmetrical.   Motor exam:  Normal tone, muscle bulk and symmetric strength in all extremities.  Sensory:  Fine touch, pinprick and vibration were tested in all extremities. Proprioception tested in the upper extremities was normal.  Coordination: Rapid alternating movements in the  fingers/hands was normal. Finger-to-nose maneuver  normal without evidence of ataxia, dysmetria or tremor.  Gait and station: Patient walks without assistive device and is able unassisted to stand up . Strength within normal limits.  Stance is stable and normal. Turns with 3 Steps.  Deep tendon reflexes: in the  upper and lower extremities are symmetric and intact. .    Assessment:  After physical and neurologic examination, review of laboratory studies,  Personal review of imaging studies, reports of other /same  Imaging studies, results of polysomnography and / or neurophysiology testing and pre-existing records as far as provided in visit., my assessment is:   1) Mrs. Burger have several risk factors for the presence of sleep apnea some of them related to advancing age, a more stooped posture and more narrow upper airway.  She no longer snores which I find remarkable.  She has a remote history of tobacco use and her main complaint is not excessive daytime sleepiness but fatigue the feeling of a latent drowsiness not being quite energetic.  Fatigue can also be influenced by autoimmune conditions, rheumatological diseases) depression.    My role will it will be to rule out an organic cause and we will start with a sleep study to see if she has obstructive or central sleep apnea with or without hypoxemia. Patient prefers a HST.   The patient was advised of the nature of the diagnosed disorder , the treatment options and the  risks for general health and wellness arising from not treating the condition.   I spent more than 45 minutes of face to face time with the patient.  Greater than 50% of time was spent in counseling and coordination of care. We have discussed the diagnosis and differential and I answered the patient's questions.    Plan:  Treatment plan and additional workup : I ordered both, HST and PSG .    Larey Seat, MD 0/25/8527, 7:82 AM  Certified in Neurology by  ABPN Certified in [place] by [facility] [address] [place], [place] [address]

## 2018-03-09 ENCOUNTER — Telehealth: Payer: Self-pay | Admitting: Family Medicine

## 2018-03-09 MED ORDER — AMLODIPINE BESYLATE 5 MG PO TABS: 5.0000 mg | ORAL_TABLET | Freq: Every day | ORAL | 2 refills | Status: DC

## 2018-03-09 NOTE — Telephone Encounter (Signed)
Please advise 

## 2018-03-09 NOTE — Telephone Encounter (Signed)
Refill sent.

## 2018-03-09 NOTE — Telephone Encounter (Signed)
Copied from [place] [id number]. Topic: Quick Communication - Rx Refill/Question >> Mar 09, 2018  9:13 AM Reyne Dumas L wrote: Medication: amLODipine (NORVASC) 5 MG tablet  Has the patient contacted their pharmacy? Yes - states they have not heard back from [name].  Pt states at this time she only has a couple days worth of medication left. (Agent: If no, request that the patient contact the pharmacy for the refill.) (Agent: If yes, when and what did the pharmacy advise?)  Preferred Pharmacy (with phone number or street name): [facility], [place] [phone] (Phone) [phone] (Fax)  Agent: Please be advised that RX refills may take up to 3 business days. We ask that you follow-up with your pharmacy.

## 2018-03-11 MED ORDER — HYDROCODONE-HOMATROPINE 5-1.5 MG/5ML PO SYRP: 5.0000 mL | ORAL_SOLUTION | Freq: Three times a day (TID) | ORAL | 0 refills | Status: DC | PRN

## 2018-03-11 MED ORDER — HYDROCODONE-HOMATROPINE 5-1.5 MG/5ML PO SYRP: 5.0000 mL | ORAL_SOLUTION | Freq: Three times a day (TID) | ORAL | 0 refills | Status: AC | PRN

## 2018-03-11 NOTE — Addendum Note (Signed)
Addended by: Lamar Blinks C on: 03/11/2018 05:44 PM   Modules accepted: Orders

## 2018-03-11 NOTE — Telephone Encounter (Signed)
Called and LMOM that I am rx hycodan for her Do not combine with clonazepam

## 2018-04-23 ENCOUNTER — Encounter: Payer: PPO | Admitting: Neurology

## 2018-04-23 DIAGNOSIS — I495 Sick sinus syndrome: Secondary | ICD-10-CM

## 2018-04-23 DIAGNOSIS — J440 Chronic obstructive pulmonary disease with acute lower respiratory infection: Secondary | ICD-10-CM

## 2018-04-23 DIAGNOSIS — I951 Orthostatic hypotension: Secondary | ICD-10-CM

## 2018-04-23 DIAGNOSIS — G4730 Sleep apnea, unspecified: Secondary | ICD-10-CM

## 2018-04-23 DIAGNOSIS — M519 Unspecified thoracic, thoracolumbar and lumbosacral intervertebral disc disorder: Secondary | ICD-10-CM

## 2018-04-23 DIAGNOSIS — G4700 Insomnia, unspecified: Secondary | ICD-10-CM

## 2018-04-23 DIAGNOSIS — E785 Hyperlipidemia, unspecified: Principal | ICD-10-CM

## 2018-04-23 DIAGNOSIS — Z950 Presence of cardiac pacemaker: Secondary | ICD-10-CM

## 2018-04-24 ENCOUNTER — Other Ambulatory Visit: Payer: Self-pay

## 2018-04-29 ENCOUNTER — Encounter: Payer: Self-pay | Admitting: Neurology

## 2018-05-01 ENCOUNTER — Encounter: Payer: Self-pay | Admitting: Family Medicine

## 2018-05-23 ENCOUNTER — Other Ambulatory Visit: Payer: Self-pay

## 2018-05-23 ENCOUNTER — Ambulatory Visit (INDEPENDENT_AMBULATORY_CARE_PROVIDER_SITE_OTHER): Payer: PPO | Admitting: *Deleted

## 2018-05-23 DIAGNOSIS — R002 Palpitations: Secondary | ICD-10-CM

## 2018-05-23 DIAGNOSIS — I495 Sick sinus syndrome: Secondary | ICD-10-CM | POA: Diagnosis not present

## 2018-05-26 ENCOUNTER — Other Ambulatory Visit: Payer: Self-pay | Admitting: Family Medicine

## 2018-05-27 LAB — CUP PACEART REMOTE DEVICE CHECK
Date Time Interrogation Session: 20200517182105
Implantable Lead Implant Date: 20171101
Implantable Lead Implant Date: 20171101
Implantable Lead Location: 753859
Implantable Lead Location: 753860
Implantable Lead Model: 377
Implantable Lead Model: 377
Implantable Lead Serial Number: 49553678
Implantable Lead Serial Number: 49617393
Implantable Pulse Generator Implant Date: 20171101
Pulse Gen Model: 394969
Pulse Gen Serial Number: 68817609

## 2018-06-11 NOTE — Progress Notes (Signed)
Remote pacemaker transmission.   

## 2018-06-12 DIAGNOSIS — L821 Other seborrheic keratosis: Secondary | ICD-10-CM | POA: Diagnosis not present

## 2018-06-12 DIAGNOSIS — L570 Actinic keratosis: Secondary | ICD-10-CM | POA: Diagnosis not present

## 2018-06-15 ENCOUNTER — Encounter: Payer: Self-pay | Admitting: Neurology

## 2018-06-15 NOTE — Telephone Encounter (Signed)
Will send you the report this afternoon.

## 2018-06-20 ENCOUNTER — Other Ambulatory Visit: Payer: Self-pay

## 2018-06-20 ENCOUNTER — Ambulatory Visit (INDEPENDENT_AMBULATORY_CARE_PROVIDER_SITE_OTHER): Payer: PPO | Admitting: Neurology

## 2018-06-20 DIAGNOSIS — G4733 Obstructive sleep apnea (adult) (pediatric): Secondary | ICD-10-CM | POA: Diagnosis not present

## 2018-06-25 ENCOUNTER — Other Ambulatory Visit: Payer: Self-pay | Admitting: Neurology

## 2018-06-25 NOTE — Procedures (Signed)
Patient Information     First Name: Theresa Last Name: Sharesa Mejia: [id number]  Birth Date: [birth year] Age: 78 Gender: Female  Referring Provider: Silvestre Mesi, MD BMI: 30.3 (W=154 lb, H=5' 0'')  Neck Circ.:  15 '' Epworth:  7/24 points   Sleep Study Information    Study Date: Jun 20, 2018 S/H/A Version: 001.001.001.001 / 4.1.1528 / 3  History    Theresa Mejia reports that over 20 years ago she had a sleep study under the guidance of Dr. Baird Lyons who found borderline apnea and gave her the option to try CPAP.  She felt that CPAP use rather cumbersome; it did not hurt her but it did not change her fragmented sleep or her non-restful sleep pattern.  She discontinued after a while.  Theresa Mejia has not felt rested and refreshed for 2 decades at least.  She has many times mentioned this to primary care and specialty care physicians.  She has also heard that there are many new treatments available should she be diagnosed with obstructive sleep apnea.  she has meanwhile developed other co-morbidities: She has a listed diagnosis of bronchitis. She now has a pacemaker, due to bradycardia and near syncope. She carries a diagnosis of hypothyroidism, was treated by Dr. Lenna Gilford at the time.            No longer snoring- she wakes up feeling " fine' but fatigues quickly.  Summary & Diagnosis:    Moderate severity of obstructive sleep apnea at an AHI of 27.5/h with mild REM sleep accentuation.  There was no prolonged hypoxia, but brady- tachycardia is found.   Recommendations:      I can offer CPAP or treatment by dental device to this patient, the lack of hypoxia allows for dental device apnea treatments. However, very fragmented sleep (Insomnia) may respond to either or none of this options.               Sleep Summary  Oxygen Saturation Statistics   Start Study Time: End Study Time: Total Recording Time:  10:54:58PM 8:03:19 AM 9 hrs, 8 min  Total Sleep Time % REM of Sleep Time:  7 hrs, 38  min 20.4    Mean: 93 Minimum: 85 Maximum: 99  Mean of Desaturations Nadirs (%):   91  Oxygen Desatur. %:  4-9 10-20 >20 Total  Events Number Total   51 96.2   2  0  3.8 0.0  53 100.0  Oxygen Saturation: <90 <=88  <85 <80 <70  Duration (minutes): Sleep % 0.4 0.1 0.1 0.0  0.0 0.0  0.0 0.0 0.0 0.0     Respiratory Indices      Total Events REM NREM All Night  pRDI:  172  pAHI:  172 ODI:  53  pAHIc:  18  % CSR: 0.0 29.7 29.7 5.6 4.8 27.0 27.0 9.2 2.4 27.5 27.5 8.5 2.9       Pulse Rate Statistics during Sleep (BPM)      Mean: 73 Minimum: 41 Maximum: 114    Indices are calculated using technically valid sleep time of  6 hrs, 15 min. pRDI/pAHI are calculated using oxi desaturations ? 3% Sit Body Position Statistics  Position Supine Prone Right Left Non-Supine  Sleep (min) 121.0 181.8 154.5 1.5 337.9  Sleep % 26.4 39.6 33.7 0.3 73.6  pRDI 27.4 33.1 21.2 N/A 27.5  pAHI 27.4 33.1 21.2 N/A 27.5  ODI 9.8 10.5 5.3 N/A 8.1     Snoring Statistics  Snoring Level (dB) >40 >50 >60 >70 >80 >Threshold (45)  Sleep (min) 130.8 28.4 1.9 0.0 0.0 53.0  Sleep % 28.5 6.2 0.4 0.0 0.0 11.5    Mean: 42 dB Sleep Stages Chart                                          pAHI=27.5                                                      Mild              Moderate                    Severe                                                 5              15                    30  * Reference values are according to AASM guidelines

## 2018-06-26 ENCOUNTER — Other Ambulatory Visit: Payer: Self-pay

## 2018-06-26 DIAGNOSIS — J301 Allergic rhinitis due to pollen: Secondary | ICD-10-CM

## 2018-06-26 MED ORDER — LEVOCETIRIZINE DIHYDROCHLORIDE 5 MG PO TABS: 5.0000 mg | ORAL_TABLET | Freq: Every evening | ORAL | 3 refills | Status: DC

## 2018-06-27 ENCOUNTER — Telehealth: Payer: Self-pay | Admitting: Neurology

## 2018-06-27 NOTE — Telephone Encounter (Signed)
-----   Message from Larey Seat, MD sent at 06/25/2018  4:48 PM EDT ----- Moderate severity of obstructive sleep apnea at an AHI of 27.5/h with mild REM sleep accentuation.  There was no prolonged hypoxia, but brady- tachycardia is found.   Recommendations:     I can offer CPAP or treatment by dental device to this patient, the lack of hypoxia allows for dental device apnea treatments. However, very fragmented sleep (Insomnia) may respond to either or none of this options.  Myriam Jacobson, Please call and ask the patient if she is open to one of the above named treatment options. I will be happy to order autotitration CPAP for her, between 5-15 cm water, 3 cm EPR , heated humidity and her choice of interface- or refer to dentist. She can also entertain the Inspire procedure.

## 2018-06-27 NOTE — Telephone Encounter (Signed)
I called pt. I advised pt that Dr. Brett Fairy reviewed their sleep study results and found that pt has moderate sleep apnea. Dr. Brett Fairy  recommends that pt treats the apnea with auto CPAP or dental device. I reviewed PAP compliance expectations with the pt. Pt is agreeable to starting a CPAP. I discussed in detail what each device was and how that process looks. Pt will discuss with her husband on how she would like to move forward and will call us back.   If patient calls back and would like the dental route. Advise the patient we will place the referral in to dentist and in the next week or so someone should be in contact with her to get her scheduled with the dentist.  If she prefers the CPAP route please advise that I will send a order over to [facility] [phone] for them to also process through insurance and they will contact her to get her set up with a machine within a week or so. Please also schedule an apt for insurance purposes with NP or MD in Aug or first of September. Advise the patient I will send a letter through [place] with information on the insurance requirements and follow up apt date.

## 2018-06-29 NOTE — Telephone Encounter (Signed)
Pt has called to inform RN Myriam Jacobson she would like to move forward with the CPAP.

## 2018-07-02 ENCOUNTER — Encounter: Payer: Self-pay | Admitting: Neurology

## 2018-07-04 ENCOUNTER — Other Ambulatory Visit: Payer: Self-pay | Admitting: Neurology

## 2018-07-04 DIAGNOSIS — Z950 Presence of cardiac pacemaker: Secondary | ICD-10-CM

## 2018-07-04 DIAGNOSIS — G4730 Sleep apnea, unspecified: Secondary | ICD-10-CM

## 2018-07-04 DIAGNOSIS — I495 Sick sinus syndrome: Secondary | ICD-10-CM

## 2018-07-04 DIAGNOSIS — G4700 Insomnia, unspecified: Secondary | ICD-10-CM

## 2018-07-04 DIAGNOSIS — G4733 Obstructive sleep apnea (adult) (pediatric): Secondary | ICD-10-CM

## 2018-07-05 DIAGNOSIS — G4733 Obstructive sleep apnea (adult) (pediatric): Secondary | ICD-10-CM | POA: Diagnosis not present

## 2018-07-13 ENCOUNTER — Other Ambulatory Visit: Payer: Self-pay | Admitting: Family Medicine

## 2018-07-13 DIAGNOSIS — F339 Major depressive disorder, recurrent, unspecified: Secondary | ICD-10-CM

## 2018-07-13 NOTE — Telephone Encounter (Signed)
Patient would like to know if this medication can be sent in for a 33mo supply rather than 33mo. Please advise.     Medication: buPROPion (WELLBUTRIN SR) 150 MG 12 hr tablet

## 2018-07-16 MED ORDER — BUPROPION HCL ER (SR) 150 MG PO TB12: 150.0000 mg | ORAL_TABLET | Freq: Two times a day (BID) | ORAL | 1 refills | Status: DC

## 2018-07-16 NOTE — Telephone Encounter (Signed)
Rx sent as 90 day supply.

## 2018-08-03 ENCOUNTER — Encounter: Payer: Self-pay | Admitting: Neurology

## 2018-08-03 NOTE — Telephone Encounter (Signed)
Dear Mrs. Lennie Odor- I have asked my RN to contact you with that appointment. ( they don't trust me with the calendar :) !)  Larey Seat, MD

## 2018-08-04 DIAGNOSIS — G4733 Obstructive sleep apnea (adult) (pediatric): Secondary | ICD-10-CM | POA: Diagnosis not present

## 2018-08-22 ENCOUNTER — Ambulatory Visit (INDEPENDENT_AMBULATORY_CARE_PROVIDER_SITE_OTHER): Payer: PPO | Admitting: *Deleted

## 2018-08-22 DIAGNOSIS — I495 Sick sinus syndrome: Secondary | ICD-10-CM | POA: Diagnosis not present

## 2018-08-24 LAB — CUP PACEART REMOTE DEVICE CHECK
Date Time Interrogation Session: 20200814092328
Implantable Lead Implant Date: 20171101
Implantable Lead Implant Date: 20171101
Implantable Lead Location: 753859
Implantable Lead Location: 753860
Implantable Lead Model: 377
Implantable Lead Model: 377
Implantable Lead Serial Number: 49553678
Implantable Lead Serial Number: 49617393
Implantable Pulse Generator Implant Date: 20171101
Pulse Gen Model: 394969
Pulse Gen Serial Number: 68817609

## 2018-08-31 ENCOUNTER — Encounter: Payer: Self-pay | Admitting: Cardiology

## 2018-08-31 NOTE — Progress Notes (Signed)
Remote pacemaker transmission.   

## 2018-09-02 NOTE — Progress Notes (Signed)
Cardiology Office Note Date:  09/04/2018  Patient ID:  Theresa Mejia, Theresa Mejia August 10, 1940, MRN [id number] PCP:  Darreld Mclean, MD  Cardiologist:  Dr. Marlou Porch Electrophysiologist: Dr. Lovena Le    Chief Complaint:  annual EP visit  History of Present Illness: Theresa Mejia is a 78 y.o. female with history of orthostatic dizziness, (some dizziness has been described as position and better with meclizine as well) PVCs, chronotropic incompitance w/PPM, HTN, hypothyroidism, OSA w/CPAP  She saw B. Rosita Fire, [place] in Dec 2019, her orthostasis symptoms were improved with d/c of HCTZ and reduction in her Micardis.  She was further instructed to split her Micardis in 1/2 to 20 AM and PM.  She comes in today to be seen for Dr. Lovena Le.  Last seen by him Aug 2019.  She was doing well without changes to her programming or meds.   Since her PPM implant she has not had any further dizziness.  She feels well, no CP, palpitations or SOB.  She got her CPAP 3 mo ago though struggles to tolerate it and most nights takes it off.  No dizzy spells, no near syncope or syncope.    She gets annual labs with her PMD.    Device information:  Biotronik dual chamber PPM, implanted 11/11/15   Past Medical History:  Diagnosis Date  . Allergic rhinitis   . Depression   . DJD (degenerative joint disease)   . GERD (gastroesophageal reflux disease)   . Headache(784.0)   . Hypercholesteremia   . Hypersomnia   . Hypertension   . Hypothyroidism   . Internal hemorrhoid 11/2008   s/p banding ([facility])  . Lumbar disc disease   . OBSTRUCTIVE SLEEP APNEA    NPSG 04/29/07- AHI 1.1/hr, RDI 21.2/hr. Weight 176 lbs. CPAP was tried based on the RDI.    [name] OVERACTIVE BLADDER   . Presence of permanent cardiac pacemaker 11/11/2015  . Reactive depression (situational)   . Sinus node dysfunction (HCC)   . Spondylosis, cervical     Past Surgical History:  Procedure Laterality Date  . BIOPSY BREAST    . BREAST EXCISIONAL BIOPSY  Left 2011  . EP IMPLANTABLE DEVICE N/A 11/11/2015   Procedure: Pacemaker Implant;  Surgeon: Evans Lance, MD;  Location: [facility];  Service: Cardiovascular;  Laterality: N/A;  . eye lid lift    . INSERT / REPLACE / REMOVE PACEMAKER  11/11/2015  . KNEE ARTHROSCOPY     Dr. [name]  . MOUTH SURGERY  08/25/2017   implant fell out due to bone loss  . rotator cuff surgery     Dr. [name]  . TUBAL LIGATION      Current Outpatient Medications  Medication Sig Dispense Refill  . amLODipine (NORVASC) 5 MG tablet Take 1 tablet (5 mg total) by mouth daily. 90 tablet 2  . buPROPion (WELLBUTRIN SR) 150 MG 12 hr tablet Take 1 tablet (150 mg total) by mouth 2 (two) times daily. 180 tablet 1  . Cholecalciferol (VITAMIN D3 PO) Take 1 capsule by mouth daily.    . clonazePAM (KLONOPIN) 0.5 MG tablet Take 1/2 at bedtime as needed for insomnia, may increase to a whole tablet if needed 30 tablet 0  . fluticasone (FLONASE) 50 MCG/ACT nasal spray SPRAY 2 SPRAYS INTO EACH NOSTRIL EVERY DAY 48 g 1  . levocetirizine (XYZAL) 5 MG tablet Take 1 tablet (5 mg total) by mouth every evening. 90 tablet 3  . levothyroxine (SYNTHROID) 125 MCG tablet TAKE  1 TABLET BY MOUTH EVERY DAY 90 tablet 1  . promethazine-dextromethorphan (PROMETHAZINE-DM) 6.25-15 MG/5ML syrup Take 5 mLs by mouth 4 (four) times daily as needed. 118 mL 0  . telmisartan (MICARDIS) 80 MG tablet TAKE 1 TABLET BY MOUTH EVERY DAY 90 tablet 3   No current facility-administered medications for this visit.     Allergies:   Ramipril, Sulfa antibiotics, and Sulfonamide derivatives   Social History:  The patient  reports that she quit smoking about 52 years ago. Her smoking use included cigarettes. She quit after 8.00 years of use. She has never used smokeless tobacco. She reports that she does not drink alcohol or use drugs.   Family History:  The patient's family history includes Alzheimer's disease in her paternal aunt and paternal uncle; Cancer (age  of onset: 12) in her mother; Crohn's disease (age of onset: 74) in her father; Diabetes in her paternal grandmother; Hypothyroidism in her mother; Macular degeneration in her mother; Parkinsonism in her maternal grandmother; Stroke (age of onset: 41) in her mother; Tremor in her mother.  ROS:  Please see the history of present illness.  All other systems are reviewed and otherwise negative.   PHYSICAL EXAM:  VS:  BP 110/76   Pulse 81   Ht 5' (1.524 m)   Wt 152 lb (68.9 kg)   BMI 29.69 kg/m  BMI: Body mass index is 29.69 kg/m. Well nourished, well developed, in no acute distress  HEENT: normocephalic, atraumatic  Neck: no JVD, carotid bruits or masses Cardiac:  RRR; no significant murmurs, no rubs, or gallops Lungs:  CTA b/l, no wheezing, rhonchi or rales  Abd: soft, nontender MS: no deformity or atrophy Ext: no edema  Skin: warm and dry, no rash Neuro:  No gross deficits appreciated Psych: euthymic mood, full affect  PPM site is stable, no tethering or discomfort   EKG:  Done today and reviewed by myself shows AP/VS, QS V1-2, poor R progression, unchanged PPM interrogation done today and reviewed by myself:  Battery and lead measurements are good,  11 mode switches, only one long enough to create EGM is an ATach 6 seconds 99% AP 0% VP   08/27/15: TTE Study Conclusions - Left ventricle: The cavity size was normal. Wall thickness was   normal. Systolic function was normal. The estimated ejection   fraction was in the range of 55% to 60%. Wall motion was normal;   there were no regional wall motion abnormalities. Left   ventricular diastolic function parameters were normal. - Aortic valve: There was trivial regurgitation. - Mitral valve: There was mild regurgitation. - Left atrium: The atrium was mildly dilated. - Atrial septum: No defect or patent foramen ovale was identified. - Impressions: Normal GLS -22.7.  Impressions: - Normal GLS -22.7.   09/03/15: Lexiscan  stress test  Nuclear stress EF: 64%.  Defect 1: There is a small defect of mild severity present in the apex location.  The study is normal.  This is a low risk study.  The left ventricular ejection fraction is normal (55-65%).     Recent Labs: 01/08/2018: Hemoglobin 12.1; Platelets 290.0; TSH 1.01  No results found for requested labs within last 8760 hours.   CrCl cannot be calculated (Patient's most recent lab result is older than the maximum 21 days allowed.).   Wt Readings from Last 3 Encounters:  09/04/18 152 lb (68.9 kg)  03/07/18 153 lb (69.4 kg)  03/06/18 152 lb 9.6 oz (69.2 kg)  Other studies reviewed: Additional studies/records reviewed today include: summarized above  ASSESSMENT AND PLAN:  1. PPM     Intact function, no programming changes made  2. Orthostatic dizziness     resolved  3. HTN     Looks good, no changes    Disposition: F/u with Dr. Marlou Porch as directed by him, every 87mo remote device checks nd EO in-clinic in 1 year, sooner if needed  Current medicines are reviewed at length with the patient today.  The patient did not have any concerns regarding medicines.  Venetia Night, PA-C 09/04/2018 12:04 PM     [facility]  [id number] [phone] (office)  [phone] (fax)

## 2018-09-04 ENCOUNTER — Other Ambulatory Visit: Payer: Self-pay

## 2018-09-04 ENCOUNTER — Ambulatory Visit: Payer: PPO | Admitting: Physician Assistant

## 2018-09-04 ENCOUNTER — Encounter: Payer: Self-pay | Admitting: Physician Assistant

## 2018-09-04 VITALS — BP 110/76 | HR 81 | Ht 60.0 in | Wt 152.0 lb

## 2018-09-04 DIAGNOSIS — I495 Sick sinus syndrome: Secondary | ICD-10-CM

## 2018-09-04 DIAGNOSIS — I10 Essential (primary) hypertension: Secondary | ICD-10-CM | POA: Diagnosis not present

## 2018-09-04 DIAGNOSIS — R42 Dizziness and giddiness: Secondary | ICD-10-CM

## 2018-09-04 DIAGNOSIS — G4733 Obstructive sleep apnea (adult) (pediatric): Secondary | ICD-10-CM | POA: Diagnosis not present

## 2018-09-04 DIAGNOSIS — Z950 Presence of cardiac pacemaker: Secondary | ICD-10-CM | POA: Diagnosis not present

## 2018-09-04 NOTE — Patient Instructions (Addendum)
Medication Instructions:  Your physician recommends that you continue on your current medications as directed. Please refer to the Current Medication list given to you today.  If you need a refill on your cardiac medications before your next appointment, please call your pharmacy.   Lab work: NONE ORDERED  TODAY  If you have labs (blood work) drawn today and your tests are completely normal, you will receive your results only by: [name] MyChart Message (if you have MyChart) OR . A paper copy in the mail If you have any lab test that is abnormal or we need to change your treatment, we will call you to review the results.  Testing/Procedures:NONE ORDERED  TODAY    Follow-Up: At [facility], you and your health needs are our priority.  As part of our continuing mission to provide you with exceptional heart care, we have created designated Provider Care Teams.  These Care Teams include your primary Cardiologist (physician) and Advanced Practice Providers (APPs -  Physician Assistants and Nurse Practitioners) who all work together to provide you with the care you need, when you need it. You will need a follow up appointment in 1 years.  Please call our office 2 months in advance to schedule this appointment.  You may see  Dr. Rayann Heman or one of the following Advanced Practice Providers on your designated Care Team:   Chanetta Marshall, NP . Tommye Standard, PA-C . Joesph July PA-C  Any Other Special Instructions Will Be Listed Below (If Applicable).

## 2018-09-18 NOTE — Progress Notes (Signed)
  PATIENT: Theresa Mejia DOB: 07/11/1940  REASON FOR VISIT: follow up HISTORY FROM: patient  Chief Complaint  Patient presents with  . Follow-up    Initial cpap f/u. Alone. Corner room. Patient mentioned that since she has started using the machine she hasn't seen any change in her fatigue.      HISTORY OF PRESENT ILLNESS: Today 09/19/18 Theresa Mejia is a 77 y.o. female here today for follow up of OSA without hypoxia on CPAP.  Theresa Mejia reports that despite being compliant with CPAP, she has not noted significant improvement in her fatigue.  She understands risk of untreated sleep apnea and is willing to continue therapy.  She states that her biggest issue is fighting with the tubing.  Compliance report dated 08/19/2018 through 09/17/2018 reveals that she has used CPAP 30 out of the last 30 days for compliance of 100%.  27 of those days she used CPAP greater than 4 hours for compliance of 90%.  Average usage was 6 hours.  AHI was 1.3 on 5 to 15 cm of water and EPR of 3.  There was no significant leak noted.   HISTORY: (copied from Dr Dohmeier's note on 03/07/2018)  HPI:  Theresa Mejia is a 77 y.o. female patient , seen here as in a referral  from Dr. Copland for a new evaluation of possible sleep apnea.   Theresa Mejia has not felt rested and refreshed for 2 decades at least.  She has many times mentioning this to primary care and specialty care physicians.  She has also heard that there are many new treatments out should she be diagnosed with obstructive sleep apnea. Theresa Mejia reports that over 20 years ago she had a sleep study under the guidance of Dr. Clinton Young who found borderline apnea and gave her the option to try CPAP.  She felt that CPAP use rather cumbersome it did not hurt her but it did not change her fragmented sleep or her non-restful sleep pattern.  She discontinued after a while.  In the interval of 20 years she has developed other co-morbidities:  She now has a pacemaker,  due to bradycardia and near syncope. She carries a diagnosis of hypothyroidism, was treated by Dr. Nadel at the time.   Sleep habits are as follows: dinner time 6 PM- dinner without alcohol, bedtime is 9 PM. She no longer shares the room with her husband, she sleeps in the guest room- she  reads in bed, the room is cool and quiet- but not dark- she has a lot of dormant electronics in the bedroom.sleeps on her side, wakes up prone. Bed is raised by 12 degrees,1 pillow. Nocturia times 1-2. Dreams often but doesn't remember the dreams.  Not longer snoring- wakes up " fine' but fatigues quickly,    Sleep medical history; frequent sinuitis. fatigue, non restful, non refreshed.   Family sleep history: 2 bothers , none of them has apnea.    Social history: married, retired from [employer].  2 adult children, 2 grandchildren. 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 ETOH socially, Caffeine: 50/50 coffee in AM and one cup at lunch, 1 soda a day.     REVIEW OF SYSTEMS: Out of a complete 14 system review of symptoms, the patient complains only of the following symptoms, fatigue and all other reviewed systems are negative.  Epworth sleepiness scale: 12 Fatigue severity scale: 31  ALLERGIES: Allergies  Allergen Reactions  . Ramipril       REACTION: pt states INTOLERANT to Altace  . Sulfa Antibiotics     Says it dropped her blood pressure really low.   . Sulfonamide Derivatives     REACTION: hypotension    HOME MEDICATIONS: Outpatient Medications Prior to Visit  Medication Sig Dispense Refill  . amLODipine (NORVASC) 5 MG tablet Take 1 tablet (5 mg total) by mouth daily. 90 tablet 2  . buPROPion (WELLBUTRIN SR) 150 MG 12 hr tablet Take 1 tablet (150 mg total) by mouth 2 (two) times daily. 180 tablet 1  . Cholecalciferol (VITAMIN D3 PO) Take 1 capsule by mouth daily.    . clonazePAM (KLONOPIN) 0.5 MG tablet Take 1/2 at bedtime as needed for insomnia, may increase to a whole  tablet if needed 30 tablet 0  . fluticasone (FLONASE) 50 MCG/ACT nasal spray SPRAY 2 SPRAYS INTO EACH NOSTRIL EVERY DAY 48 g 1  . levocetirizine (XYZAL) 5 MG tablet Take 1 tablet (5 mg total) by mouth every evening. 90 tablet 3  . levothyroxine (SYNTHROID) 125 MCG tablet TAKE 1 TABLET BY MOUTH EVERY DAY 90 tablet 1  . promethazine-dextromethorphan (PROMETHAZINE-DM) 6.25-15 MG/5ML syrup Take 5 mLs by mouth 4 (four) times daily as needed. 118 mL 0  . telmisartan (MICARDIS) 80 MG tablet TAKE 1 TABLET BY MOUTH EVERY DAY 90 tablet 3   No facility-administered medications prior to visit.     PAST MEDICAL HISTORY: Past Medical History:  Diagnosis Date  . Allergic rhinitis   . Depression   . DJD (degenerative joint disease)   . GERD (gastroesophageal reflux disease)   . Headache(784.0)   . Hypercholesteremia   . Hypersomnia   . Hypertension   . Hypothyroidism   . Internal hemorrhoid 11/2008   s/p banding ([facility])  . Lumbar disc disease   . OBSTRUCTIVE SLEEP APNEA    NPSG 04/29/07- AHI 1.1/hr, RDI 21.2/hr. Weight 176 lbs. CPAP was tried based on the RDI.    [name] OVERACTIVE BLADDER   . Presence of permanent cardiac pacemaker 11/11/2015  . Reactive depression (situational)   . Sinus node dysfunction (HCC)   . Spondylosis, cervical     PAST SURGICAL HISTORY: Past Surgical History:  Procedure Laterality Date  . BIOPSY BREAST    . BREAST EXCISIONAL BIOPSY Left 2011  . EP IMPLANTABLE DEVICE N/A 11/11/2015   Procedure: Pacemaker Implant;  Surgeon: Evans Lance, MD;  Location: [facility];  Service: Cardiovascular;  Laterality: N/A;  . eye lid lift    . INSERT / REPLACE / REMOVE PACEMAKER  11/11/2015  . KNEE ARTHROSCOPY     Dr. [name]  . MOUTH SURGERY  08/25/2017   implant fell out due to bone loss  . rotator cuff surgery     Dr. [name]  . TUBAL LIGATION      FAMILY HISTORY: Family History  Problem Relation Age of Onset  . Crohn's disease Father 82  . Macular degeneration  Mother   . Cancer Mother 42       bladder, never confirmed with biospy  . Hypothyroidism Mother   . Tremor Mother   . Stroke Mother 67  . Parkinsonism Maternal Grandmother   . Alzheimer's disease Paternal Aunt   . Alzheimer's disease Paternal Uncle   . Diabetes Paternal Grandmother   . Colon cancer Neg Hx   . Esophageal cancer Neg Hx   . Rectal cancer Neg Hx     SOCIAL HISTORY: Social History   Socioeconomic History  . Marital status: Married  Spouse name: Not on file  . Number of children: 2  . Years of education: Not on file  . Highest education level: Not on file  Occupational History  . 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 Financial resource strain: Not on file  . Food insecurity    Worry: Not on file    Inability: Not on file  . Transportation needs    Medical: Not on file    Non-medical: Not on file  Tobacco Use  . Smoking status: Former Smoker    Years: 8.00    Types: Cigarettes    Quit date: 01/10/1966    Years since quitting: 52.7  . Smokeless tobacco: Never Used  Substance and Sexual Activity  . Alcohol use: No  . Drug use: No  . Sexual activity: Never  Lifestyle  . Physical activity    Days per week: Not on file    Minutes per session: Not on file  . Stress: Not on file  Relationships  . Social Herbalist on phone: Not on file    Gets together: Not on file    Attends religious service: Not on file    Active member of club or organization: Not on file    Attends meetings of clubs or organizations: Not on file    Relationship status: Not on file  . Intimate partner violence    Fear of current or ex partner: Not on file    Emotionally abused: Not on file    Physically abused: Not on file    Forced sexual activity: Not on file  Other Topics Concern  . Not on file  Social History Narrative   married, lives with spouse -   Retired 05/2013      PHYSICAL EXAM   Vitals:   09/19/18 0859  BP: 109/71  Pulse: 70  Temp: (!) 96 F (35.6 C)  TempSrc: Oral  Weight: 152 lb 6.4 oz (69.1 kg)  Height: 5' (1.524 m)   Body mass index is 29.76 kg/m.  Generalized: Well developed, in no acute distress  mallampati 4+ Neurological examination  Mentation: Alert oriented to time, place, history taking. Follows all commands speech and language fluent Cranial nerve II-XII: Pupils were equal round reactive to light. Extraocular movements were full, visual field were full on confrontational test. Facial sensation and strength were normal. Uvula tongue midline. Head turning and shoulder shrug  were normal and symmetric. Motor: The motor testing reveals 5 over 5 strength of all 4 extremities. Good symmetric motor tone is noted throughout. Mild tremor of left hand noted  Sensory: Sensory testing is intact to soft touch on all 4 extremities. No evidence of extinction is noted.  Coordination: Cerebellar testing reveals good finger-nose-finger and heel-to-shin bilaterally.  Gait and station: Gait is normal.  DIAGNOSTIC DATA (LABS, IMAGING, TESTING) - I reviewed patient records, labs, notes, testing and imaging myself where available.  MMSE - Mini Mental State Exam 11/03/2016  Orientation to time 5  Orientation to Place 5  Registration 3  Attention/ Calculation 5  Recall 3  Language- name 2 objects 2  Language- repeat 1  Language- follow 3 step command 3  Language- read & follow direction 1  Write a sentence 1  Copy design 1  Total score 30     Lab Results  Component Value Date   WBC 5.0 01/08/2018   HGB 12.1 01/08/2018  HCT 35.8 (L) 01/08/2018   MCV 88.3 01/08/2018   PLT 290.0 01/08/2018      Component Value Date/Time   NA 138 06/08/2017 1010   K 3.6 06/08/2017 1010   CL 101 06/08/2017 1010   CO2 29 06/08/2017 1010   GLUCOSE 84 06/08/2017 1010   BUN 16 06/08/2017 1010   CREATININE 0.75 06/08/2017 1010   CREATININE 0.70 11/04/2015 1106   CALCIUM  9.2 06/08/2017 1010   PROT 7.0 06/08/2017 1010   ALBUMIN 4.2 06/08/2017 1010   AST 17 06/08/2017 1010   ALT 15 06/08/2017 1010   ALKPHOS 47 06/08/2017 1010   BILITOT 0.5 06/08/2017 1010   GFRNONAA 98.50 07/28/2009 1020   GFRAA  03/30/2009 0930    >60        The eGFR has been calculated using the MDRD equation. This calculation has not been validated in all clinical situations. eGFR's persistently <60 mL/min signify possible Chronic Kidney Disease.   Lab Results  Component Value Date   CHOL 194 11/03/2016   HDL 35.70 (L) 11/03/2016   LDLCALC 132 (H) 11/03/2016   LDLDIRECT 148.3 09/01/2010   TRIG 129.0 11/03/2016   CHOLHDL 5 11/03/2016   Lab Results  Component Value Date   HGBA1C 5.5 10/15/2012   Lab Results  Component Value Date   VITAMINB12 1,344 (H) 06/08/2017   Lab Results  Component Value Date   TSH 1.01 01/08/2018    ASSESSMENT AND PLAN 77 y.o. year old female  has a past medical history of Allergic rhinitis, Depression, DJD (degenerative joint disease), GERD (gastroesophageal reflux disease), Headache(784.0), Hypercholesteremia, Hypersomnia, Hypertension, Hypothyroidism, Internal hemorrhoid (11/2008), Lumbar disc disease, OBSTRUCTIVE SLEEP APNEA, OVERACTIVE BLADDER, Presence of permanent cardiac pacemaker (11/11/2015), Reactive depression (situational), Sinus node dysfunction (HCC), and Spondylosis, cervical. here with     ICD-10-CM   1. Obstructive sleep apnea  G47.33     [place] is doing very well from a compliance standpoint.  Compliance data reveals excellent compliance.  She was encouraged to continue using CPAP nightly and for greater than 4 hours each night.  I have suggested valerian root as directed by packaging for aid in relaxation and sleep.  She will call for any concerns.  She was advised to follow-up with me in 1 year, sooner if needed.  She verbalizes understanding and agreement with this plan.   No orders of the defined types were placed in this  encounter.    No orders of the defined types were placed in this encounter.     I spent 15 minutes with the patient. 50% of this time was spent counseling and educating patient on plan of care and medications.     , FNP-C 09/19/2018, 9:55 AM [facility] [address], [address] [place], [place] [address] [phone]  

## 2018-09-19 ENCOUNTER — Ambulatory Visit (INDEPENDENT_AMBULATORY_CARE_PROVIDER_SITE_OTHER): Payer: PPO | Admitting: Family Medicine

## 2018-09-19 ENCOUNTER — Encounter: Payer: Self-pay | Admitting: Family Medicine

## 2018-09-19 ENCOUNTER — Other Ambulatory Visit: Payer: Self-pay

## 2018-09-19 VITALS — BP 109/71 | HR 70 | Temp 96.0°F | Ht 60.0 in | Wt 152.4 lb

## 2018-09-19 DIAGNOSIS — G4733 Obstructive sleep apnea (adult) (pediatric): Secondary | ICD-10-CM | POA: Diagnosis not present

## 2018-09-19 NOTE — Patient Instructions (Signed)
Continue CPAP nightly and for greater than 4 hours each night  I recommend Valerian Root for sleep aid, you can get this at any pharmacy   Follow up in 1 year, sooner if needed  Valerian, Valeriana officinalis oral dosage forms What is this medicine? VALERIAN (vuh LEER ee uhn) is an herbal or dietary supplement. It is promoted to help relaxation, sleep and stress. The FDA has not approved this supplement for any medical use. This supplement may be used for other purposes; ask your health care provider or pharmacist if you have questions. This medicine may be used for other purposes; ask your health care provider or pharmacist if you have questions. What should I tell my health care provider before I take this medicine? They need to know if you have any of these conditions:  drug abuse or addiction  emotional illness like anxiety, depression  heart disease  kidney disease  liver disease  if you frequently drink alcohol containing drinks  sleeping problems  an unusual or allergic reaction to valerian, herbs, plants, other medicines, foods, dyes, or preservatives  pregnant or trying to get pregnant  breast-feeding How should I use this medicine? Take this supplement by mouth with a glass of water. Follow the directions on the package labeling, or take as directed by your health care professional. If this supplement upsets your stomach, take it with food. Do not take this supplement more often than directed. Contact your pediatrician regarding the use of this supplement in children. Special care may be needed. Overdosage: If you think you have taken too much of this medicine contact a poison control center or emergency room at once. NOTE: This medicine is only for you. Do not share this medicine with others. What if I miss a dose? If you miss a dose, take it as soon as you can. If it is almost time for your next dose, take only that dose. Do not take double or extra doses. What  may interact with this medicine? Check with your doctor or healthcare professional if you are taking any of the following medications:  alcohol  barbiturate medicines for sleep or seizures  medicines for depression, anxiety, or psychotic disturbances  medicines for sleep  muscle relaxants  narcotic pain medicines This list may not describe all possible interactions. Give your health care provider a list of all the medicines, herbs, non-prescription drugs, or dietary supplements you use. Also tell them if you smoke, drink alcohol, or use illegal drugs. Some items may interact with your medicine. What should I watch for while using this medicine? See your doctor if your symptoms do not get better or if they get worse. You may get drowsy or dizzy. Do not drive, use machinery, or do anything that needs mental alertness until you know how this medicine affects you. Do not stand or sit up quickly, especially if you are an older patient. This reduces the risk of dizzy or fainting spells. Alcohol may interfere with the effect of this medicine. If you are scheduled for any medical or dental procedure, tell your healthcare provider that you are taking this supplement. You may need to stop taking this supplement before the procedure. Herbal or dietary supplements are not regulated like medicines. Rigid quality control standards are not required for dietary supplements. The purity and strength of these products can vary. The safety and effect of this dietary supplement for a certain disease or illness is not well known. This product is not intended to diagnose,  treat, cure or prevent any disease. The Food and Drug Administration suggests the following to help consumers protect themselves:  Always read product labels and follow directions.  Natural does not mean a product is safe for humans to take.  Look for products that include USP after the ingredient name. This means that the manufacturer followed  the standards of the [name] Pharmacopoeia.  Supplements made or sold by a nationally known food or drug company are more likely to be made under tight controls. You can write to the company for more information about how the product was made. What side effects may I notice from receiving this medicine? Side effects that you should report to your doctor or health care professional as soon as possible:  allergic reactions like skin rash, itching or hives, swelling of the face, lips, or tongue  breathing problems  changes in emotions or moods, like depressed mood  confused, forgetful  dark urine  fast, irregular heartbeat  problems with balance, talking, walking  unusually weak or tired  yellowing of the eyes, skin Side effects that usually do not require medical attention (report to your doctor or health care professional if they continue or are bothersome):  stomach upset  dizziness  tiredness This list may not describe all possible side effects. Call your doctor for medical advice about side effects. You may report side effects to FDA at 1-800-FDA-1088. Where should I keep my medicine? Keep out of the reach of children. Store at room temperature or as directed on the package label. Protect from moisture. Throw away any unused supplement after the expiration date. NOTE: This sheet is a summary. It may not cover all possible information. If you have questions about this medicine, talk to your doctor, pharmacist, or health care provider.  2020 Elsevier/Gold Standard (2007-09-24 17:33:52)   Sleep Apnea Sleep apnea affects breathing during sleep. It causes breathing to stop for a short time or to become shallow. It can also increase the risk of:  Heart attack.  Stroke.  Being very overweight (obese).  Diabetes.  Heart failure.  Irregular heartbeat. The goal of treatment is to help you breathe normally again. What are the causes? There are three kinds of sleep apnea:   Obstructive sleep apnea. This is caused by a blocked or collapsed airway.  Central sleep apnea. This happens when the brain does not send the right signals to the muscles that control breathing.  Mixed sleep apnea. This is a combination of obstructive and central sleep apnea. The most common cause of this condition is a collapsed or blocked airway. This can happen if:  Your throat muscles are too relaxed.  Your tongue and tonsils are too large.  You are overweight.  Your airway is too small. What increases the risk?  Being overweight.  Smoking.  Having a small airway.  Being older.  Being female.  Drinking alcohol.  Taking medicines to calm yourself (sedatives or tranquilizers).  Having family members with the condition. What are the signs or symptoms?  Trouble staying asleep.  Being sleepy or tired during the day.  Getting angry a lot.  Loud snoring.  Headaches in the morning.  Not being able to focus your mind (concentrate).  Forgetting things.  Less interest in sex.  Mood swings.  Personality changes.  Feelings of sadness (depression).  Waking up a lot during the night to pee (urinate).  Dry mouth.  Sore throat. How is this diagnosed?  Your medical history.  A physical exam.  A test that is done when you are sleeping (sleep study). The test is most often done in a sleep lab but may also be done at home. How is this treated?   Sleeping on your side.  Using a medicine to get rid of mucus in your nose (decongestant).  Avoiding the use of alcohol, medicines to help you relax, or certain pain medicines (narcotics).  Losing weight, if needed.  Changing your diet.  Not smoking.  Using a machine to open your airway while you sleep, such as: ? An oral appliance. This is a mouthpiece that shifts your lower jaw forward. ? A CPAP device. This device blows air through a mask when you breathe out (exhale). ? An EPAP device. This has valves that  you put in each nostril. ? A BPAP device. This device blows air through a mask when you breathe in (inhale) and breathe out.  Having surgery if other treatments do not work. It is important to get treatment for sleep apnea. Without treatment, it can lead to:  High blood pressure.  Coronary artery disease.  In men, not being able to have an erection (impotence).  Reduced thinking ability. Follow these instructions at home: Lifestyle  Make changes that your doctor recommends.  Eat a healthy diet.  Lose weight if needed.  Avoid alcohol, medicines to help you relax, and some pain medicines.  Do not use any products that contain nicotine or tobacco, such as cigarettes, e-cigarettes, and chewing tobacco. If you need help quitting, ask your doctor. General instructions  Take over-the-counter and prescription medicines only as told by your doctor.  If you were given a machine to use while you sleep, use it only as told by your doctor.  If you are having surgery, make sure to tell your doctor you have sleep apnea. You may need to bring your device with you.  Keep all follow-up visits as told by your doctor. This is important. Contact a doctor if:  The machine that you were given to use during sleep bothers you or does not seem to be working.  You do not get better.  You get worse. Get help right away if:  Your chest hurts.  You have trouble breathing in enough air.  You have an uncomfortable feeling in your back, arms, or stomach.  You have trouble talking.  One side of your body feels weak.  A part of your face is hanging down. These symptoms may be an emergency. Do not wait to see if the symptoms will go away. Get medical help right away. Call your local emergency services (911 in the [place] Do not drive yourself to the hospital. Summary  This condition affects breathing during sleep.  The most common cause is a collapsed or blocked airway.  The goal of treatment  is to help you breathe normally while you sleep. This information is not intended to replace advice given to you by your health care provider. Make sure you discuss any questions you have with your health care provider. Document Released: 10/06/2007 Document Revised: 10/13/2017 Document Reviewed: 08/22/2017 Elsevier Patient Education  2020 [employer].

## 2018-09-29 ENCOUNTER — Other Ambulatory Visit: Payer: Self-pay | Admitting: Family Medicine

## 2018-09-29 DIAGNOSIS — F5101 Primary insomnia: Secondary | ICD-10-CM

## 2018-10-01 ENCOUNTER — Encounter: Payer: Self-pay | Admitting: Family Medicine

## 2018-10-01 NOTE — Telephone Encounter (Signed)
Due for visit but can refill- 03/11/2018  1   03/11/2018  Hydrocodone-Homatropine Soln  60.00  4 Je Cop   [id number]   [place] ([id number])   0  15.00 MME  Private Pay   [place]  02/28/2018  1   02/28/2018  Clonazepam 0.5 MG Tablet  30.00  30 Je Cop   [id number]   [place] ([id number])   0  1.00 LME  Private Pay   [place]  03/22/2017  2   03/22/2017  Clonazepam 0.5 MG Tablet  30.00  30 Je Cop   [id number]   [place] ([id number])   0  1.00 LME  Private Pay   [place]   Message to pt

## 2018-10-05 DIAGNOSIS — G4733 Obstructive sleep apnea (adult) (pediatric): Secondary | ICD-10-CM | POA: Diagnosis not present

## 2018-10-09 ENCOUNTER — Other Ambulatory Visit: Payer: Self-pay | Admitting: Family Medicine

## 2018-10-09 ENCOUNTER — Encounter: Payer: Self-pay | Admitting: Family Medicine

## 2018-10-09 DIAGNOSIS — Z1239 Encounter for other screening for malignant neoplasm of breast: Secondary | ICD-10-CM

## 2018-10-09 DIAGNOSIS — Z1231 Encounter for screening mammogram for malignant neoplasm of breast: Secondary | ICD-10-CM

## 2018-10-09 DIAGNOSIS — E2839 Other primary ovarian failure: Secondary | ICD-10-CM

## 2018-10-18 ENCOUNTER — Encounter: Payer: Self-pay | Admitting: Family Medicine

## 2018-10-18 ENCOUNTER — Encounter: Payer: Self-pay | Admitting: Neurology

## 2018-10-20 NOTE — Telephone Encounter (Signed)
I can understand her frustration - it is harder to benefit from CPAP in case of milder apnea. Her apnea was not mild (!)  PS : Notes recorded by Jaysun Wessels, Asencion Partridge, MD on 06/25/2018 at 4:48 PM EDT  Moderate severity of obstructive sleep apnea at an AHI of 27.5/h with mild REM sleep accentuation.  There was no prolonged hypoxia, but brady- tachycardia is found.    Yes, Obstructive Sleep Apnea can indeed be treated with a dental device if biological teeth are present and the apnea is neither central nor REM dependent.

## 2018-10-20 NOTE — Patient Instructions (Addendum)
It was good to see you again today!  Take care, I will be in touch with your labs and x-ray reports as soon as possible If your uric acid level is high, we might try colchicine to see if it helps your foot pain.   I sent in a prescription for Wellbutrin 200 mg twice a day.  Try this in place of the 150 for a few weeks and let me know if helpful.  If you like it I can change your longer-term prescription  Tomorrow when you get your flu shot, you might ask the pharmacist about getting a tetanus booster as well.  It appears that you are due.  If possible, I would suggest the Tdap vaccine which also gives whooping cough protection

## 2018-10-20 NOTE — Progress Notes (Addendum)
[place] [facility] [address], [place], [place] [address] [phone] [id number] 2005  Date:  10/22/2018   Name:  Theresa Mejia   DOB:  1940/09/04   MRN:  [id number]  PCP:  Darreld Mclean, MD    Chief Complaint: Foot Pain (right medial foot pain, 1 week, pain to touch, some swelling)   History of Present Illness:  Theresa Mejia is a 78 y.o. very pleasant female patient who presents with the following:  Here today with concern of foot pain History of hyperlipidemia, HTN, hypothyroidism, OSA, pacemaker implanted in 2017 Last seen by myself in December of this year - at that time we tried changing her over from prozac to wellbutrin She is taking wellbutrin 150 twice a day.  This has helped her, but she wonders if we could increase her dose slightly.  She still does struggle with some depression and fatigue She does not have a lot more energy- still does feel tired She is not getting a lot of exercise She used to enjoy the gym- she has tried to walk outdoors as much as she can but it can be hard to stay motivated  Flu: scheduled tomorrow at her pharmacy Tdap: Suggested that she also do this at her pharmacy tomorrow Colon is UTD shingrix done  Routine labs due - will do today  She has noted right foot pain for about one week It hurts at the proximal instep, she wants to try and walk on the outer edge to take the pressure off It seems a tiny bit swollen No known injury Feels better in the morning, worse as the day goes on She has not increased her walking or exercise recently Left foot is ok   She has tried some ibuprofen which helps maybe some    She did try using cpap for about 2 months but it did not really help her symptomatically so she stopped using it.  She tried some valerian root for sleep but it really did not help  She and her husband are being really careful to avoid covid due to her husband having heart issues.    Their DIL wants to rent out a bed and breakfast just for their family over Thanksgiving.  Patient is somewhat apprehensive about this trip, but also really misses her family.  We discussed this in detail today.  I suggested that her family numbers might get tested for Covid in the week prior to their trip for further assurance  Lab Results  Component Value Date   TSH 1.01 01/08/2018    Patient Active Problem List   Diagnosis Date Noted  . Pansinusitis 03/06/2018  . Cough 03/06/2018  . Orthostatic hypotension 12/06/2016  . Viral URI with cough 04/20/2016  . Sinus node dysfunction ([place]) 11/11/2015  . Routine general medical examination at a health care facility 06/04/2015  . Insomnia with sleep apnea 06/04/2015  . Bronchitis, chronic obstructive w acute bronchitis ([place]) 12/10/2014  . Lumbar disc disease 09/01/2010  . Obstructive sleep apnea 05/31/2007  . Hyperlipidemia with target LDL less than 130 02/05/2007  . Depression with anxiety 02/05/2007  . Allergic rhinitis 02/05/2007  . Hypothyroidism 02/02/2007  . Essential hypertension 02/02/2007    Past Medical History:  Diagnosis Date  . Allergic rhinitis   . Depression   . DJD (degenerative joint disease)   . GERD (gastroesophageal reflux disease)   . Headache(784.0)   . Hypercholesteremia   .  Hypersomnia   . Hypertension   . Hypothyroidism   . Internal hemorrhoid 11/2008   s/p banding ([place])  . Lumbar disc disease   . OBSTRUCTIVE SLEEP APNEA    NPSG 04/29/07- AHI 1.1/hr, RDI 21.2/hr. Weight 176 lbs. CPAP was tried based on the RDI.    [name] OVERACTIVE BLADDER   . Presence of permanent cardiac pacemaker 11/11/2015  . Reactive depression (situ[facility])   . Sinus node dysfunction ([facility])   . Spondylosis, cervical     Past Surgical History:  Procedure Laterality Date  . BIOPSY BREAST    . BREAST EXCISIONAL BIOPSY Left 2011  . EP IMPLANTABLE DEVICE N/A 11/11/2015   Procedure: Pacemaker Implant;  Surgeon: Evans Lance, MD;   Location: [facility];  Service: Cardiovascular;  Laterality: N/A;  . eye lid lift    . INSERT / REPLACE / REMOVE PACEMAKER  11/11/2015  . KNEE ARTHROSCOPY     Dr. [name]  . MOUTH SURGERY  08/25/2017   implant fell out due to bone loss  . rotator cuff surgery     Dr. [name]  . TUBAL LIGATION      Social History   Tobacco Use  . Smoking status: Former Smoker    Years: 8.00    Types: Cigarettes    Quit date: 01/10/1966    Years since quitting: 52.8  . Smokeless tobacco: Never Used  Substance Use Topics  . Alcohol use: No  . Drug use: No    Family History  Problem Relation Age of Onset  . Crohn's disease Father 49  . Macular degeneration Mother   . Cancer Mother 38       bladder, never confirmed with biospy  . Hypothyroidism Mother   . Tremor Mother   . Stroke Mother 2  . Parkinsonism Maternal Grandmother   . Alzheimer's disease Paternal Aunt   . Alzheimer's disease Paternal Uncle   . Diabetes Paternal Grandmother   . Colon cancer Neg Hx   . Esophageal cancer Neg Hx   . Rectal cancer Neg Hx     Allergies  Allergen Reactions  . Ramipril     REACTION: pt states INTOLERANT to Altace  . Sulfa Antibiotics     Says it dropped her blood pressure really low.   . Sulfonamide Derivatives     REACTION: hypotension    Medication list has been reviewed and updated.  Current Outpatient Medications on File Prior to Visit  Medication Sig Dispense Refill  . amLODipine (NORVASC) 5 MG tablet Take 1 tablet (5 mg total) by mouth daily. 90 tablet 2  . buPROPion (WELLBUTRIN SR) 150 MG 12 hr tablet Take 1 tablet (150 mg total) by mouth 2 (two) times daily. 180 tablet 1  . Cholecalciferol (VITAMIN D3 PO) Take 1 capsule by mouth daily.    . fluticasone (FLONASE) 50 MCG/ACT nasal spray SPRAY 2 SPRAYS INTO EACH NOSTRIL EVERY DAY 48 g 1  . levocetirizine (XYZAL) 5 MG tablet Take 1 tablet (5 mg total) by mouth every evening. 90 tablet 3  . levothyroxine (SYNTHROID) 125 MCG tablet  TAKE 1 TABLET BY MOUTH EVERY DAY 90 tablet 1  . promethazine-dextromethorphan (PROMETHAZINE-DM) 6.25-15 MG/5ML syrup Take 5 mLs by mouth 4 (four) times daily as needed. 118 mL 0  . telmisartan (MICARDIS) 80 MG tablet TAKE 1 TABLET BY MOUTH EVERY DAY 90 tablet 3   No current facility-administered medications on file prior to visit.     Review of Systems:  As per  HPI- otherwise negative.   Physical Examination: Vitals:   10/22/18 1440  BP: 118/70  Pulse: 76  Resp: 16  Temp: 97.7 F (36.5 C)  SpO2: 100%   Vitals:   10/22/18 1440  Weight: 152 lb (68.9 kg)  Height: 5' (1.524 m)   Body mass index is 29.69 kg/m. Ideal Body Weight: Weight in (lb) to have BMI = 25: 127.7  GEN: WDWN, NAD, Non-toxic, A & O x 3, overweight, looks well HEENT: Atraumatic, Normocephalic. Neck supple. No masses, No LAD. Ears and Nose: No external deformity. CV: RRR, No M/G/R. No JVD. No thrill. No extra heart sounds. PULM: CTA B, no wheezes, crackles, rhonchi. No retractions. No resp. distress. No accessory muscle use. ABD: S, NT, ND EXTR: No c/c/e NEURO Normal gait.  PSYCH: Normally interactive. Conversant. Not depressed or anxious appearing.  Calm demeanor.  Right foot: No apparent swelling, redness, heat. She is tender at the medial aspect of the foot, at the cuneiform/first metatarsal junction Achilles and plantar fascia seem normal and nontender Negative Morton's neuroma squeeze test She does have pes planus, bilateral bunions, bilateral second hammertoe  Left forearm anterior aspect displays a flat, flesh-colored skin lesion slightly smaller than a dime.  Patient thinks has been present for for 5 months.  Appears most consistent with a seborrheic keratosis  Verbal consent obtained.  Liquid nitrogen used perform cryotherapy to skin lesion x3   Assessment and Plan: Hypothyroidism (acquired) - Plan: TSH  Hyperlipidemia with target LDL less than 130 - Plan: Lipid panel  Essential  hypertension - Plan: CBC, Comprehensive metabolic panel  Screening for diabetes mellitus - Plan: Hemoglobin A1c  Screening for deficiency anemia - Plan: CBC  Depression with anxiety - Plan: buPROPion (WELLBUTRIN SR) 200 MG 12 hr tablet  Insomnia with sleep apnea - Plan: Magnesium  Right foot pain - Plan: DG Foot Complete Right, Uric acid  Seborrheic keratosis  Here today for an office visit and a few other concerns Labs pending as above- Will plan further follow- up pending labs. Blood pressure under good control Will increase her wellbutrin to 200 BID from 150 BID; I sent in a 1 month supply, she will let me know what she thinks.  If better I can send in a long-term prescription Discussed her foot pain.  I suspect this may be arthritis or perhaps gout.  Offered colchicine, she would rather get her x-ray and lab results back first. Also consider a stress fracture, if persist may need referral to orthopedics Cryotherapy performed for seborrheic keratosis Signed Lamar Blinks, MD   Received her labs and x-rays 10/13, message to patient Elevation in BUN is new, may reflect dehydration  Results for orders placed or performed in visit on 10/22/18  CBC  Result Value Ref Range   WBC 5.2 4.0 - 10.5 K/uL   RBC 4.10 3.87 - 5.11 Mil/uL   Platelets 282.0 150.0 - 400.0 K/uL   Hemoglobin 12.3 12.0 - 15.0 g/dL   HCT 36.9 36.0 - 46.0 %   MCV 89.8 78.0 - 100.0 fl   MCHC 33.3 30.0 - 36.0 g/dL   RDW 13.7 11.5 - 15.5 %  Comprehensive metabolic panel  Result Value Ref Range   Sodium 140 135 - 145 mEq/L   Potassium 3.9 3.5 - 5.1 mEq/L   Chloride 104 96 - 112 mEq/L   CO2 29 19 - 32 mEq/L   Glucose, Bld 94 70 - 99 mg/dL   BUN 27 (H) 6 - 23 mg/dL  Creatinine, Ser 0.87 0.40 - 1.20 mg/dL   Total Bilirubin 0.4 0.2 - 1.2 mg/dL   Alkaline Phosphatase 57 39 - 117 U/L   AST 15 0 - 37 U/L   ALT 12 0 - 35 U/L   Total Protein 6.9 6.0 - 8.3 g/dL   Albumin 4.4 3.5 - 5.2 g/dL   Calcium 9.4 8.4 -  10.5 mg/dL   GFR 63.34 >60.00 mL/min  Hemoglobin A1c  Result Value Ref Range   Hgb A1c MFr Bld 5.6 4.6 - 6.5 %  Lipid panel  Result Value Ref Range   Cholesterol 195 0 - 200 mg/dL   Triglycerides 168.0 (H) 0.0 - 149.0 mg/dL   HDL 37.90 (L) >39.00 mg/dL   VLDL 33.6 0.0 - 40.0 mg/dL   LDL Cholesterol 124 (H) 0 - 99 mg/dL   Total CHOL/HDL Ratio 5    NonHDL 157.16   TSH  Result Value Ref Range   TSH 1.97 0.35 - 4.50 uIU/mL  Magnesium  Result Value Ref Range   Magnesium 2.0 1.5 - 2.5 mg/dL  Uric acid  Result Value Ref Range   Uric Acid, Serum 4.1 2.4 - 7.0 mg/dL   Dg Foot Complete Right  Result Date: 10/23/2018 CLINICAL DATA:  RIGHT foot pain for 1 week EXAM: RIGHT FOOT COMPLETE - 3+ VIEW COMPARISON:  None. FINDINGS: No fracture or dislocation of mid foot or forefoot. The phalanges are normal. The calcaneus is normal. Mild hallux valgus deformity of the first ray. No soft tissue abnormality. IMPRESSION: No acute osseous abnormality. Electronically Signed   By: Suzy Bouchard M.D.   On: 10/23/2018 10:20

## 2018-10-22 ENCOUNTER — Ambulatory Visit (HOSPITAL_BASED_OUTPATIENT_CLINIC_OR_DEPARTMENT_OTHER)
Admission: RE | Admit: 2018-10-22 | Discharge: 2018-10-22 | Disposition: A | Discharge status: Home / Self Care | Payer: PPO | Source: Ambulatory Visit | Attending: Family Medicine | Admitting: Family Medicine

## 2018-10-22 ENCOUNTER — Ambulatory Visit (INDEPENDENT_AMBULATORY_CARE_PROVIDER_SITE_OTHER): Payer: PPO | Admitting: Family Medicine

## 2018-10-22 ENCOUNTER — Other Ambulatory Visit: Payer: Self-pay

## 2018-10-22 ENCOUNTER — Encounter: Payer: Self-pay | Admitting: Family Medicine

## 2018-10-22 VITALS — BP 118/70 | HR 76 | Temp 97.7°F | Resp 16 | Ht 60.0 in | Wt 152.0 lb

## 2018-10-22 DIAGNOSIS — M79671 Pain in right foot: Secondary | ICD-10-CM | POA: Insufficient documentation

## 2018-10-22 DIAGNOSIS — F418 Other specified anxiety disorders: Secondary | ICD-10-CM

## 2018-10-22 DIAGNOSIS — L821 Other seborrheic keratosis: Secondary | ICD-10-CM | POA: Diagnosis not present

## 2018-10-22 DIAGNOSIS — G4730 Sleep apnea, unspecified: Secondary | ICD-10-CM

## 2018-10-22 DIAGNOSIS — I10 Essential (primary) hypertension: Secondary | ICD-10-CM

## 2018-10-22 DIAGNOSIS — G4700 Insomnia, unspecified: Secondary | ICD-10-CM | POA: Diagnosis not present

## 2018-10-22 DIAGNOSIS — Z131 Encounter for screening for diabetes mellitus: Secondary | ICD-10-CM | POA: Diagnosis not present

## 2018-10-22 DIAGNOSIS — E785 Hyperlipidemia, unspecified: Secondary | ICD-10-CM

## 2018-10-22 DIAGNOSIS — Z130 Encounter for screening for diseases of the blood and blood-forming organs and certain disorders involving the immune mechanism: Secondary | ICD-10-CM | POA: Diagnosis not present

## 2018-10-22 DIAGNOSIS — E039 Hypothyroidism, unspecified: Secondary | ICD-10-CM

## 2018-10-22 MED ORDER — BUPROPION HCL ER (SR) 200 MG PO TB12: 200.0000 mg | ORAL_TABLET | Freq: Two times a day (BID) | ORAL | 0 refills | Status: DC

## 2018-10-23 ENCOUNTER — Encounter: Payer: Self-pay | Admitting: Family Medicine

## 2018-10-23 DIAGNOSIS — E785 Hyperlipidemia, unspecified: Secondary | ICD-10-CM

## 2018-10-23 DIAGNOSIS — M79671 Pain in right foot: Secondary | ICD-10-CM

## 2018-10-23 LAB — COMPREHENSIVE METABOLIC PANEL
ALT: 12 U/L (ref 0–35)
AST: 15 U/L (ref 0–37)
Albumin: 4.4 g/dL (ref 3.5–5.2)
Alkaline Phosphatase: 57 U/L (ref 39–117)
BUN: 27 mg/dL — ABNORMAL HIGH (ref 6–23)
CO2: 29 mEq/L (ref 19–32)
Calcium: 9.4 mg/dL (ref 8.4–10.5)
Chloride: 104 mEq/L (ref 96–112)
Creatinine, Ser: 0.87 mg/dL (ref 0.40–1.20)
GFR: 63.34 mL/min (ref >60.00)
Glucose, Bld: 94 mg/dL (ref 70–99)
Potassium: 3.9 mEq/L (ref 3.5–5.1)
Sodium: 140 mEq/L (ref 135–145)
Total Bilirubin: 0.4 mg/dL (ref 0.2–1.2)
Total Protein: 6.9 g/dL (ref 6.0–8.3)

## 2018-10-23 LAB — CBC
HCT: 36.9 % (ref 36.0–46.0)
Hemoglobin: 12.3 g/dL (ref 12.0–15.0)
MCHC: 33.3 g/dL (ref 30.0–36.0)
MCV: 89.8 fl (ref 78.0–100.0)
Platelets: 282 10*3/uL (ref 150.0–400.0)
RBC: 4.1 Mil/uL (ref 3.87–5.11)
RDW: 13.7 % (ref 11.5–15.5)
WBC: 5.2 10*3/uL (ref 4.0–10.5)

## 2018-10-23 LAB — LIPID PANEL
Cholesterol: 195 mg/dL (ref 0–200)
HDL: 37.9 mg/dL — ABNORMAL LOW (ref >39.00)
LDL Cholesterol: 124 mg/dL — ABNORMAL HIGH (ref 0–99)
NonHDL: 157.16
Total CHOL/HDL Ratio: 5
Triglycerides: 168 mg/dL — ABNORMAL HIGH (ref 0.0–149.0)
VLDL: 33.6 mg/dL (ref 0.0–40.0)

## 2018-10-23 LAB — MAGNESIUM: Magnesium: 2 mg/dL (ref 1.5–2.5)

## 2018-10-23 LAB — URIC ACID: Uric Acid, Serum: 4.1 mg/dL (ref 2.4–7.0)

## 2018-10-23 LAB — TSH: TSH: 1.97 u[IU]/mL (ref 0.35–4.50)

## 2018-10-23 LAB — HEMOGLOBIN A1C: Hgb A1c MFr Bld: 5.6 % (ref 4.6–6.5)

## 2018-10-25 ENCOUNTER — Other Ambulatory Visit: Payer: Self-pay | Admitting: Family Medicine

## 2018-10-25 DIAGNOSIS — R0982 Postnasal drip: Secondary | ICD-10-CM

## 2018-10-25 MED ORDER — SIMVASTATIN 20 MG PO TABS: 20.0000 mg | ORAL_TABLET | Freq: Every day | ORAL | 3 refills | Status: DC

## 2018-10-25 NOTE — Addendum Note (Signed)
Addended by: Lamar Blinks C on: 10/25/2018 10:14 AM   Modules accepted: Orders

## 2018-10-31 DIAGNOSIS — M76821 Posterior tibial tendinitis, right leg: Secondary | ICD-10-CM | POA: Diagnosis not present

## 2018-11-16 NOTE — Progress Notes (Signed)
Virtual Visit via Video Note  I connected with patient on 11/19/18 at  3:15 PM EST by audio enabled telemedicine application and verified that I am speaking with the correct person using two identifiers.   THIS ENCOUNTER IS A VIRTUAL VISIT DUE TO COVID-19 - PATIENT WAS NOT SEEN IN THE OFFICE. PATIENT HAS CONSENTED TO VIRTUAL VISIT / TELEMEDICINE VISIT   Location of patient: home  Location of provider: office  I discussed the limitations of evaluation and management by telemedicine and the availability of in person appointments. The patient expressed understanding and agreed to proceed.   Subjective:   Theresa Mejia is a 78 y.o. female who presents for Medicare Annual (Subsequent) preventive examination.  Review of Systems:    Home Safety/Smoke Alarms: Feels safe in home. Smoke alarms in place.  Lives w/ husband in 1 story home.   Female:      Mammo- scheduled 12/26/18       Dexa scan- scheduled 12/26/18        CCS- 02/05/15.  Eyes- Dr.Lyles. Pt reports she has appt next wk.    Objective:     Vitals: Unable to assess. This visit is enabled though telemedicine due to Covid 19.   Advanced Directives 11/19/2018 11/14/2017 11/03/2016 11/11/2015 06/08/2015 06/04/2015  Does Patient Have a Medical Advance Directive? Yes Yes Yes Yes Yes Yes  Type of Paramedic of [place];Living will [place];Living will [place];Living will - [place];Living will [place];Living will  Does patient want to make changes to medical advance directive? No - Patient declined - - - - -  Copy of [place] in Chart? Yes - validated most recent copy scanned in chart (See row information) Yes Yes Yes Yes Yes    Tobacco Social History   Tobacco Use  Smoking Status Former Smoker  . Years: 8.00  . Types: Cigarettes  . Quit date: 01/10/1966  . Years since quitting: 52.8  Smokeless Tobacco  Never Used     Counseling given: Not Answered   Clinical Intake: Pain : No/denies pain   Past Medical History:  Diagnosis Date  . Allergic rhinitis   . Depression   . DJD (degenerative joint disease)   . GERD (gastroesophageal reflux disease)   . Headache(784.0)   . Hypercholesteremia   . Hypersomnia   . Hypertension   . Hypothyroidism   . Internal hemorrhoid 11/2008   s/p banding ([place])  . Lumbar disc disease   . OBSTRUCTIVE SLEEP APNEA    NPSG 04/29/07- AHI 1.1/hr, RDI 21.2/hr. Weight 176 lbs. CPAP was tried based on the RDI.    [name] OVERACTIVE BLADDER   . Presence of permanent cardiac pacemaker 11/11/2015  . Reactive depression (situational)   . Sinus node dysfunction (HCC)   . Spondylosis, cervical    Past Surgical History:  Procedure Laterality Date  . BIOPSY BREAST    . BREAST EXCISIONAL BIOPSY Left 2011  . EP IMPLANTABLE DEVICE N/A 11/11/2015   Procedure: Pacemaker Implant;  Surgeon: Evans Lance, MD;  Location: [facility];  Service: Cardiovascular;  Laterality: N/A;  . eye lid lift    . INSERT / REPLACE / REMOVE PACEMAKER  11/11/2015  . KNEE ARTHROSCOPY     Dr. [name]  . MOUTH SURGERY  08/25/2017   implant fell out due to bone loss  . rotator cuff surgery     Dr. [name]  . TUBAL LIGATION  Family History  Problem Relation Age of Onset  . Crohn's disease Father 91  . Macular degeneration Mother   . Cancer Mother 38       bladder, never confirmed with biospy  . Hypothyroidism Mother   . Tremor Mother   . Stroke Mother 35  . Parkinsonism Maternal Grandmother   . Alzheimer's disease Paternal Aunt   . Alzheimer's disease Paternal Uncle   . Diabetes Paternal Grandmother   . Colon cancer Neg Hx   . Esophageal cancer Neg Hx   . Rectal cancer Neg Hx    Social History   Socioeconomic History  . Marital status: Married    Spouse name: Not on file  . Number of children: 2  . Years of education: Not on file  . Highest education level: Not on  file  Occupational History  . 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 Financial resource strain: Not on file  . Food insecurity    Worry: Not on file    Inability: Not on file  . Transportation needs    Medical: Not on file    Non-medical: Not on file  Tobacco Use  . Smoking status: Former Smoker    Years: 8.00    Types: Cigarettes    Quit date: 01/10/1966    Years since quitting: 52.8  . Smokeless tobacco: Never Used  Substance and Sexual Activity  . Alcohol use: No  . Drug use: No  . Sexual activity: Never  Lifestyle  . Physical activity    Days per week: Not on file    Minutes per session: Not on file  . Stress: Not on file  Relationships  . Social Herbalist on phone: Not on file    Gets together: Not on file    Attends religious service: Not on file    Active member of club or organization: Not on file    Attends meetings of clubs or organizations: Not on file    Relationship status: Not on file  Other Topics Concern  . Not on file  Social History Narrative   married, lives with spouse -   Retired 05/2013    Outpatient Encounter Medications as of 11/19/2018  Medication Sig  . amLODipine (NORVASC) 5 MG tablet Take 1 tablet (5 mg total) by mouth daily.  [name] buPROPion (WELLBUTRIN SR) 150 MG 12 hr tablet Take 1 tablet (150 mg total) by mouth 2 (two) times daily.  [name] buPROPion (WELLBUTRIN SR) 200 MG 12 hr tablet Take 1 tablet (200 mg total) by mouth 2 (two) times daily.  . Cholecalciferol (VITAMIN D3 PO) Take 1 capsule by mouth daily.  . fluticasone (FLONASE) 50 MCG/ACT nasal spray SPRAY 2 SPRAYS INTO EACH NOSTRIL EVERY DAY  . ipratropium (ATROVENT) 0.03 % nasal spray PLACE 2 SPRAYS INTO THE NOSE 4 (FOUR) TIMES DAILY. USE AS NEEDED FOR POST NASAL DRIP  . levocetirizine (XYZAL) 5 MG tablet Take 1 tablet (5 mg total) by mouth every evening.  [name] levothyroxine (SYNTHROID) 125 MCG tablet TAKE 1  TABLET BY MOUTH EVERY DAY  . promethazine-dextromethorphan (PROMETHAZINE-DM) 6.25-15 MG/5ML syrup Take 5 mLs by mouth 4 (four) times daily as needed.  . simvastatin (ZOCOR) 20 MG tablet Take 1 tablet (20 mg total) by mouth at bedtime.  [name] telmisartan (MICARDIS) 80 MG tablet TAKE 1 TABLET BY MOUTH EVERY DAY   No facility-administered encounter medications on file as  of 11/19/2018.     Activities of Daily Living In your present state of health, do you have any difficulty performing the following activities: 11/19/2018  Hearing? Y  Comment wearing hearing aids.  Vision? N  Difficulty concentrating or making decisions? N  Walking or climbing stairs? N  Dressing or bathing? N  Doing errands, shopping? N  Preparing Food and eating ? N  Using the Toilet? N  In the past six months, have you accidently leaked urine? N  Do you have problems with loss of bowel control? N  Managing your Medications? N  Managing your Finances? N  Housekeeping or managing your Housekeeping? N  Some recent data might be hidden    Patient Care Team: Copland, Gay Filler, MD as PCP - General (Family Medicine) Deneise Lever, MD (Pulmonary Disease) Gaynelle Arabian, MD (Orthopedic Surgery) Richmond Campbell, MD (Gastroenterology)    Assessment:   This is a routine wellness examination for Astella. Physical assessment deferred to PCP.  Exercise Activities and Dietary recommendations Current Exercise Habits: Home exercise routine, Type of exercise: walking, Time (Minutes): 10, Frequency (Times/Week): 5, Weekly Exercise (Minutes/Week): 50, Intensity: Mild, Exercise limited by: None identified   Diet (meal preparation, eat out, water intake, caffeinated beverages, dairy products, fruits and vegetables): in general, a "healthy" diet  , well balanced    Goals    . Increase physical activity    . Weight (lb) < 143 lb (64.9 kg)     Lose 10lbs. Goal 143lb       Fall Risk Fall Risk  11/19/2018 11/14/2017 11/03/2016  06/08/2015 06/08/2015  Falls in the past year? 0 0 No No No  Number falls in past yr: 0 - - - -  Injury with Fall? 0 - - - -   Depression Screen PHQ 2/9 Scores 11/19/2018 11/14/2017 11/03/2016 06/08/2015  PHQ - 2 Score 0 6 4 1   PHQ- 9 Score - 21 19 7      Cognitive Function   MMSE - Mini Mental State Exam 11/03/2016  Orientation to time 5  Orientation to Place 5  Registration 3  Attention/ Calculation 5  Recall 3  Language- name 2 objects 2  Language- repeat 1  Language- follow 3 step command 3  Language- read & follow direction 1  Write a sentence 1  Copy design 1  Total score 30     6CIT Screen 11/19/2018  What Year? 0 points  What month? 0 points  What time? 0 points  Count back from 20 0 points  Months in reverse 0 points  Repeat phrase 0 points  Total Score 0    Immunization History  Administered Date(s) Administered  . Influenza Split 10/11/2010, 10/04/2013  . Influenza Whole 10/13/2009  . Influenza, High Dose Seasonal PF 10/14/2016, 10/22/2017  . Influenza-Unspecified 10/28/2015  . Pneumococcal Conjugate-13 11/06/2014  . Pneumococcal Polysaccharide-23 02/25/2008  . Td 02/25/2008  . Zoster Recombinat (Shingrix) 04/27/2017, 08/01/2017     Screening Tests Health Maintenance  Topic Date Due  . TETANUS/TDAP  02/24/2018  . COLONOSCOPY  02/04/2025  . INFLUENZA VACCINE  Completed  . DEXA SCAN  Completed  . PNA vac Low Risk Adult  Completed      Plan:   See you next year!  Continue to eat heart healthy diet (full of fruits, vegetables, whole grains, lean protein, water--limit salt, fat, and sugar intake) and increase physical activity as tolerated.  Continue doing brain stimulating activities (puzzles, reading, adult coloring books, staying active) to keep  memory sharp.    I have personally reviewed and noted the following in the patient's chart:   . Medical and social history . Use of alcohol, tobacco or illicit drugs  . Current medications and  supplements . Functional ability and status . Nutritional status . Physical activity . Advanced directives . List of other physicians . Hospitalizations, surgeries, and ER visits in previous 12 months . Vitals . Screenings to include cognitive, depression, and falls . Referrals and appointments  In addition, I have reviewed and discussed with patient certain preventive protocols, quality metrics, and best practice recommendations. A written personalized care plan for preventive services as well as general preventive health recommendations were provided to patient.     Shela Nevin, [place]  11/19/2018

## 2018-11-19 ENCOUNTER — Encounter: Payer: Self-pay | Admitting: *Deleted

## 2018-11-19 ENCOUNTER — Ambulatory Visit (INDEPENDENT_AMBULATORY_CARE_PROVIDER_SITE_OTHER): Payer: PPO | Admitting: *Deleted

## 2018-11-19 ENCOUNTER — Other Ambulatory Visit: Payer: Self-pay

## 2018-11-19 DIAGNOSIS — Z0000 Encounter for general adult medical examination without abnormal findings: Secondary | ICD-10-CM | POA: Diagnosis not present

## 2018-11-19 NOTE — Patient Instructions (Signed)
See you next year!  Continue to eat heart healthy diet (full of fruits, vegetables, whole grains, lean protein, water--limit salt, fat, and sugar intake) and increase physical activity as tolerated.  Continue doing brain stimulating activities (puzzles, reading, adult coloring books, staying active) to keep memory sharp.    Theresa Mejia , Thank you for taking time to come for your Medicare Wellness Visit. I appreciate your ongoing commitment to your health goals. Please review the following plan we discussed and let me know if I can assist you in the future.   These are the goals we discussed: Goals    . Increase physical activity    . Weight (lb) < 143 lb (64.9 kg)     Lose 10lbs. Goal 143lb       This is a list of the screening recommended for you and due dates:  Health Maintenance  Topic Date Due  . Tetanus Vaccine  02/24/2018  . Colon Cancer Screening  02/04/2025  . Flu Shot  Completed  . DEXA scan (bone density measurement)  Completed  . Pneumonia vaccines  Completed    Preventive Care 28 Years and Older, Female Preventive care refers to lifestyle choices and visits with your health care provider that can promote health and wellness. This includes:  A yearly physical exam. This is also called an annual well check.  Regular dental and eye exams.  Immunizations.  Screening for certain conditions.  Healthy lifestyle choices, such as diet and exercise. What can I expect for my preventive care visit? Physical exam Your health care provider will check:  Height and weight. These may be used to calculate body mass index (BMI), which is a measurement that tells if you are at a healthy weight.  Heart rate and blood pressure.  Your skin for abnormal spots. Counseling Your health care provider may ask you questions about:  Alcohol, tobacco, and drug use.  Emotional well-being.  Home and relationship well-being.  Sexual activity.  Eating habits.  History of falls.   Memory and ability to understand (cognition).  Work and work Statistician.  Pregnancy and menstrual history. What immunizations do I need?  Influenza (flu) vaccine  This is recommended every year. Tetanus, diphtheria, and pertussis (Tdap) vaccine  You may need a Td booster every 10 years. Varicella (chickenpox) vaccine  You may need this vaccine if you have not already been vaccinated. Zoster (shingles) vaccine  You may need this after age 10. Pneumococcal conjugate (PCV13) vaccine  One dose is recommended after age 52. Pneumococcal polysaccharide (PPSV23) vaccine  One dose is recommended after age 46. Measles, mumps, and rubella (MMR) vaccine  You may need at least one dose of MMR if you were born in 1957 or later. You may also need a second dose. Meningococcal conjugate (MenACWY) vaccine  You may need this if you have certain conditions. Hepatitis A vaccine  You may need this if you have certain conditions or if you travel or work in places where you may be exposed to hepatitis A. Hepatitis B vaccine  You may need this if you have certain conditions or if you travel or work in places where you may be exposed to hepatitis B. Haemophilus influenzae type b (Hib) vaccine  You may need this if you have certain conditions. You may receive vaccines as individual doses or as more than one vaccine together in one shot (combination vaccines). Talk with your health care provider about the risks and benefits of combination vaccines. What tests do  I need? Blood tests  Lipid and cholesterol levels. These may be checked every 5 years, or more frequently depending on your overall health.  Hepatitis C test.  Hepatitis B test. Screening  Lung cancer screening. You may have this screening every year starting at age 89 if you have a 30-pack-year history of smoking and currently smoke or have quit within the past 15 years.  Colorectal cancer screening. All adults should have this  screening starting at age 15 and continuing until age 39. Your health care provider may recommend screening at age 71 if you are at increased risk. You will have tests every 1-10 years, depending on your results and the type of screening test.  Diabetes screening. This is done by checking your blood sugar (glucose) after you have not eaten for a while (fasting). You may have this done every 1-3 years.  Mammogram. This may be done every 1-2 years. Talk with your health care provider about how often you should have regular mammograms.  BRCA-related cancer screening. This may be done if you have a family history of breast, ovarian, tubal, or peritoneal cancers. Other tests  Sexually transmitted disease (STD) testing.  Bone density scan. This is done to screen for osteoporosis. You may have this done starting at age 47. Follow these instructions at home: Eating and drinking  Eat a diet that includes fresh fruits and vegetables, whole grains, lean protein, and low-fat dairy products. Limit your intake of foods with high amounts of sugar, saturated fats, and salt.  Take vitamin and mineral supplements as recommended by your health care provider.  Do not drink alcohol if your health care provider tells you not to drink.  If you drink alcohol: ? Limit how much you have to 0-1 drink a day. ? Be aware of how much alcohol is in your drink. In the [place] one drink equals one 12 oz bottle of beer (355 mL), one 5 oz glass of wine (148 mL), or one 1 oz glass of hard liquor (44 mL). Lifestyle  Take daily care of your teeth and gums.  Stay active. Exercise for at least 30 minutes on 5 or more days each week.  Do not use any products that contain nicotine or tobacco, such as cigarettes, e-cigarettes, and chewing tobacco. If you need help quitting, ask your health care provider.  If you are sexually active, practice safe sex. Use a condom or other form of protection in order to prevent STIs (sexually  transmitted infections).  Talk with your health care provider about taking a low-dose aspirin or statin. What's next?  Go to your health care provider once a year for a well check visit.  Ask your health care provider how often you should have your eyes and teeth checked.  Stay up to date on all vaccines. This information is not intended to replace advice given to you by your health care provider. Make sure you discuss any questions you have with your health care provider. Document Released: 01/23/2015 Document Revised: 12/21/2017 Document Reviewed: 12/21/2017 Elsevier Patient Education  2020 [employer].

## 2018-11-21 ENCOUNTER — Ambulatory Visit (INDEPENDENT_AMBULATORY_CARE_PROVIDER_SITE_OTHER): Payer: PPO | Admitting: *Deleted

## 2018-11-21 ENCOUNTER — Other Ambulatory Visit: Payer: Self-pay | Admitting: Family Medicine

## 2018-11-21 DIAGNOSIS — I495 Sick sinus syndrome: Secondary | ICD-10-CM | POA: Diagnosis not present

## 2018-11-21 DIAGNOSIS — R001 Bradycardia, unspecified: Secondary | ICD-10-CM

## 2018-11-21 LAB — CUP PACEART REMOTE DEVICE CHECK
Date Time Interrogation Session: 20201111201810
Implantable Lead Implant Date: 20171101
Implantable Lead Implant Date: 20171101
Implantable Lead Location: 753859
Implantable Lead Location: 753860
Implantable Lead Model: 377
Implantable Lead Model: 377
Implantable Lead Serial Number: 49553678
Implantable Lead Serial Number: 49617393
Implantable Pulse Generator Implant Date: 20171101
Pulse Gen Model: 394969
Pulse Gen Serial Number: 68817609

## 2018-11-24 ENCOUNTER — Other Ambulatory Visit: Payer: Self-pay | Admitting: Family Medicine

## 2018-11-24 DIAGNOSIS — F418 Other specified anxiety disorders: Secondary | ICD-10-CM

## 2018-12-13 NOTE — Progress Notes (Signed)
Remote pacemaker transmission.   

## 2018-12-26 ENCOUNTER — Ambulatory Visit: Payer: PPO

## 2018-12-26 ENCOUNTER — Other Ambulatory Visit: Payer: PPO

## 2018-12-28 ENCOUNTER — Encounter: Payer: Self-pay | Admitting: Family Medicine

## 2018-12-29 DIAGNOSIS — M79641 Pain in right hand: Secondary | ICD-10-CM | POA: Diagnosis not present

## 2019-01-06 ENCOUNTER — Other Ambulatory Visit: Payer: Self-pay | Admitting: Family Medicine

## 2019-01-06 DIAGNOSIS — F339 Major depressive disorder, recurrent, unspecified: Secondary | ICD-10-CM

## 2019-01-08 ENCOUNTER — Ambulatory Visit
Admission: RE | Admit: 2019-01-08 | Discharge: 2019-01-08 | Disposition: A | Discharge status: Home / Self Care | Payer: PPO | Source: Ambulatory Visit | Attending: Family Medicine | Admitting: Family Medicine

## 2019-01-08 ENCOUNTER — Other Ambulatory Visit: Payer: Self-pay

## 2019-01-08 DIAGNOSIS — Z780 Asymptomatic menopausal state: Secondary | ICD-10-CM | POA: Diagnosis not present

## 2019-01-08 DIAGNOSIS — M8589 Other specified disorders of bone density and structure, multiple sites: Secondary | ICD-10-CM | POA: Diagnosis not present

## 2019-01-08 DIAGNOSIS — Z1231 Encounter for screening mammogram for malignant neoplasm of breast: Secondary | ICD-10-CM

## 2019-01-08 DIAGNOSIS — E2839 Other primary ovarian failure: Secondary | ICD-10-CM

## 2019-01-09 ENCOUNTER — Encounter: Payer: Self-pay | Admitting: Family Medicine

## 2019-01-09 DIAGNOSIS — M8580 Other specified disorders of bone density and structure, unspecified site: Secondary | ICD-10-CM

## 2019-01-10 MED ORDER — RISEDRONATE SODIUM 35 MG PO TABS: 35.0000 mg | ORAL_TABLET | ORAL | 11 refills | Status: DC

## 2019-01-16 DIAGNOSIS — M1811 Unilateral primary osteoarthritis of first carpometacarpal joint, right hand: Secondary | ICD-10-CM | POA: Diagnosis not present

## 2019-01-16 DIAGNOSIS — M79641 Pain in right hand: Secondary | ICD-10-CM | POA: Insufficient documentation

## 2019-01-16 DIAGNOSIS — M6530 Trigger finger, unspecified finger: Secondary | ICD-10-CM | POA: Insufficient documentation

## 2019-01-16 DIAGNOSIS — M65331 Trigger finger, right middle finger: Secondary | ICD-10-CM | POA: Diagnosis not present

## 2019-01-16 DIAGNOSIS — M189 Osteoarthritis of first carpometacarpal joint, unspecified: Secondary | ICD-10-CM | POA: Insufficient documentation

## 2019-01-17 MED ORDER — ALENDRONATE SODIUM 70 MG PO TABS: 70.0000 mg | ORAL_TABLET | ORAL | 3 refills | Status: DC

## 2019-01-17 NOTE — Addendum Note (Signed)
Addended by: Lamar Blinks C on: 01/17/2019 12:25 PM   Modules accepted: Orders

## 2019-01-24 ENCOUNTER — Ambulatory Visit: Payer: Medicare Other | Attending: Internal Medicine

## 2019-01-24 DIAGNOSIS — Z23 Encounter for immunization: Secondary | ICD-10-CM

## 2019-01-24 NOTE — Progress Notes (Signed)
   Covid-19 Vaccination Clinic  Name:  SAMANATHA KERNAGHAN    MRN: [id number] DOB: 1940-03-31  01/24/2019  Ms. Kinderman was observed post Covid-19 immunization for 30 minutes based on pre-vaccination screening without incidence. She was provided with Vaccine Information Sheet and instruction to access the V-Safe system.   Ms. Tkaczyk was instructed to call 911 with any severe reactions post vaccine: [name] Difficulty breathing  . Swelling of your face and throat  . A fast heartbeat  . A bad rash all over your body  . Dizziness and weakness    Immunizations Administered    Name Date Dose VIS Date Route   Pfizer COVID-19 Vaccine 01/24/2019  9:20 AM 0.3 mL 12/21/2018 Intramuscular   Manufacturer: [employer]   Lot: [id number]   [place]: [id number]

## 2019-02-06 ENCOUNTER — Other Ambulatory Visit: Payer: Self-pay | Admitting: Family Medicine

## 2019-02-11 ENCOUNTER — Ambulatory Visit: Payer: Medicare Other

## 2019-02-11 ENCOUNTER — Ambulatory Visit: Payer: PPO | Attending: Internal Medicine

## 2019-02-11 DIAGNOSIS — Z23 Encounter for immunization: Secondary | ICD-10-CM | POA: Insufficient documentation

## 2019-02-11 NOTE — Progress Notes (Signed)
   Covid-19 Vaccination Clinic  Name:  Theresa Mejia    MRN: [id number] DOB: 1940-07-06  02/11/2019  [name] was observed post Covid-19 immunization for 15 minutes without incidence. She was provided with Vaccine Information Sheet and instruction to access the V-Safe system.   [name] was instructed to call 911 with any severe reactions post vaccine: [name] Difficulty breathing  . Swelling of your face and throat  . A fast heartbeat  . A bad rash all over your body  . Dizziness and weakness    Immunizations Administered    Name Date Dose VIS Date Route   Pfizer COVID-19 Vaccine 02/11/2019 11:17 AM 0.3 mL 12/21/2018 Intramuscular   Manufacturer: [place]   Lot: [id number]   [place]: [id number]

## 2019-02-18 DIAGNOSIS — L0889 Other specified local infections of the skin and subcutaneous tissue: Secondary | ICD-10-CM | POA: Diagnosis not present

## 2019-02-18 DIAGNOSIS — L0109 Other impetigo: Secondary | ICD-10-CM | POA: Diagnosis not present

## 2019-02-19 ENCOUNTER — Encounter: Payer: Self-pay | Admitting: Family Medicine

## 2019-02-19 ENCOUNTER — Telehealth: Payer: Self-pay | Admitting: Family Medicine

## 2019-02-19 NOTE — Chronic Care Management (AMB) (Signed)
  Chronic Care Management   Note  02/19/2019 Name: NAYLAH CORK MRN: [id number] DOB: 1940/05/25  ADALEA HANDLER is a 79 y.o. year old female who is a primary care patient of Copland, Gay Filler, MD. I reached out to [name] by phone today in response to a referral sent by Ms. Benancio Deeds Carriveau's PCP, Copland, Gay Filler, MD.   Ms. Sublette was given information about Chronic Care Management services today including:  1. CCM service includes personalized support from designated clinical staff supervised by her physician, including individualized plan of care and coordination with other care providers 2. 24/7 contact phone numbers for assistance for urgent and routine care needs. 3. Service will only be billed when office clinical staff spend 20 minutes or more in a month to coordinate care. 4. Only one practitioner may furnish and bill the service in a calendar month. 5. The patient may stop CCM services at any time (effective at the end of the month) by phone call to the office staff. 6. The patient will be responsible for cost sharing (co-pay) of up to 20% of the service fee (after annual deductible is met).  Patient agreed to services and verbal consent obtained.   Follow up plan:   Raynicia Dukes [employer] Scheduler

## 2019-02-20 ENCOUNTER — Ambulatory Visit (INDEPENDENT_AMBULATORY_CARE_PROVIDER_SITE_OTHER): Payer: PPO | Admitting: *Deleted

## 2019-02-20 DIAGNOSIS — I495 Sick sinus syndrome: Secondary | ICD-10-CM | POA: Diagnosis not present

## 2019-02-20 LAB — CUP PACEART REMOTE DEVICE CHECK
Date Time Interrogation Session: 20210210161741
Implantable Lead Implant Date: 20171101
Implantable Lead Implant Date: 20171101
Implantable Lead Location: 753859
Implantable Lead Location: 753860
Implantable Lead Model: 377
Implantable Lead Model: 377
Implantable Lead Serial Number: 49553678
Implantable Lead Serial Number: 49617393
Implantable Pulse Generator Implant Date: 20171101
Pulse Gen Model: 394969
Pulse Gen Serial Number: 68817609

## 2019-02-21 ENCOUNTER — Other Ambulatory Visit: Payer: Self-pay

## 2019-02-21 ENCOUNTER — Ambulatory Visit: Payer: PPO | Admitting: Pharmacist

## 2019-02-21 DIAGNOSIS — F418 Other specified anxiety disorders: Secondary | ICD-10-CM

## 2019-02-21 DIAGNOSIS — M85829 Other specified disorders of bone density and structure, unspecified upper arm: Secondary | ICD-10-CM

## 2019-02-21 DIAGNOSIS — J3089 Other allergic rhinitis: Secondary | ICD-10-CM

## 2019-02-21 DIAGNOSIS — E039 Hypothyroidism, unspecified: Secondary | ICD-10-CM

## 2019-02-21 DIAGNOSIS — I10 Essential (primary) hypertension: Secondary | ICD-10-CM

## 2019-02-21 DIAGNOSIS — E785 Hyperlipidemia, unspecified: Secondary | ICD-10-CM

## 2019-02-21 DIAGNOSIS — E538 Deficiency of other specified B group vitamins: Secondary | ICD-10-CM

## 2019-02-21 NOTE — Progress Notes (Signed)
PPM Remote  

## 2019-02-21 NOTE — Chronic Care Management (AMB) (Signed)
Chronic Care Management Pharmacy  Name: Theresa Mejia  MRN: [id number] DOB: [birth year]   Chief Complaint/ HPI  Theresa Mejia,  79 y.o. , female presents for their Initial CCM visit with the clinical pharmacist via telephone.  PCP : Darreld Mclean, MD  Their chronic conditions include: HTN, HLD, Depression and anxiety, Allergic Rhinitis, Hypothyroidism, GERD, Vitamin B12 Deficiency, Vitamin D Deficiency, Osteopenia  Office Visits: 01/08/19: DEXA results from Dr. Lorelei Pont - Fosamax 70mg  weekly prescribed  11/19/18: MWE w/ [place] - goal to lose 10lbs. Goal 143lbs.   10/22/18: Office visit with Dr. Lorelei Pont - foot pain, depression/fatigue. Xray of R foot w/ uric acid lab, increased bupropion to 200mg  BID. Labs drawn (CBC, TSH, Mag, and Uric Acid - WNL, CMP - BUN elevated, a1c - 5.6%, lipid - LDL 124, TG 168, HDL 37.9)   Consult Visits: 02/20/19: Cardio Pace Maker Check  02/18/19: Payton Mccallum - dx of imetigo with topical cream prescribed   Medications: Outpatient Encounter Medications as of 02/21/2019  Medication Sig Note  . alendronate (FOSAMAX) 70 MG tablet Take 1 tablet (70 mg total) by mouth every 7 (seven) days. Take with a full glass of water on an empty stomach. 02/21/2019: Takes on Saturdays  . amLODipine (NORVASC) 5 MG tablet TAKE 1 TABLET BY MOUTH EVERY Keny Donald   . buPROPion (WELLBUTRIN SR) 150 MG 12 hr tablet TAKE 1 TABLET BY MOUTH TWICE A Ilze Roselli   . Cholecalciferol (VITAMIN D3) 50 MCG (2000 UT) TABS Take 1 capsule by mouth every other Hesston Hitchens.    . fluticasone (FLONASE) 50 MCG/ACT nasal spray SPRAY 2 SPRAYS INTO EACH NOSTRIL EVERY Brenlynn Fake   . ipratropium (ATROVENT) 0.03 % nasal spray PLACE 2 SPRAYS INTO THE NOSE 4 (FOUR) TIMES DAILY. USE AS NEEDED FOR POST NASAL DRIP   . levocetirizine (XYZAL) 5 MG tablet Take 1 tablet (5 mg total) by mouth every evening.   [name] levothyroxine (SYNTHROID) 125 MCG tablet TAKE 1 TABLET BY MOUTH EVERY Zaahir Pickney   . simvastatin (ZOCOR) 20 MG tablet Take 1 tablet (20 mg  total) by mouth at bedtime.   [name] telmisartan (MICARDIS) 80 MG tablet TAKE 1 TABLET BY MOUTH EVERY Gwenith Tschida   . buPROPion (WELLBUTRIN SR) 200 MG 12 hr tablet TAKE 1 TABLET BY MOUTH TWICE A Jonavan Vanhorn (Patient not taking: Reported on 02/21/2019) 02/21/2019: Only took for a couple of weeks then moved back to 150mg .   . promethazine-dextromethorphan (PROMETHAZINE-DM) 6.25-15 MG/5ML syrup Take 5 mLs by mouth 4 (four) times daily as needed. (Patient not taking: Reported on 02/21/2019)    No facility-administered encounter medications on file as of 02/21/2019.     Current Diagnosis/Assessment:  Goals Addressed            This Visit's Progress   . Complete fasting lipid panel at next office visit      . Increase physical activity   Not on track   . LDL goal <100      . Pharmacy Care Plan       Current Barriers:  . Chronic Disease Management support, education, and care coordination needs related to HTN, HLD, Depression and anxiety, Allergic Rhinitis, Hypothyroidism, GERD, Vitamin B12 Deficiency, Vitamin D Deficiency, Osteopenia  Pharmacist Clinical Goal(s):  [name] Complete lipid panel and consider statin titration if LDL >100 . LDL goal <100 . Complete Vitamin D lab . +/- Complete Vitamin B12 lab   Interventions: . Comprehensive medication review performed.  Patient Self Care Activities:  . Patient verbalizes understanding of  plan to follow as described above, Self administers medications as prescribed, Calls pharmacy for medication refills, and Calls provider office for new concerns or questions  Initial goal documentation     . Weight (lb) < 143 lb (64.9 kg)       Increase physical activity as tolerated Maintain balanced diet Lose 10lbs. Goal 143lb      Social Dx:  Dorie Rank to [facility] in [facility] due to husband's job. 2 kids Age 90 and 37. Son in [place] (w/ 2 sons). Daughter in [place].  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 Husband [name]) had an aortic dissection same time as her mother had a stroke. Her mom passed away.  Hypertension   BP today is:  <130/80 (118/83 pulse 89)  Office blood pressures are  BP Readings from Last 3 Encounters:  10/22/18 118/70  09/19/18 109/71  09/04/18 110/76    Patient has failed these meds in the past: chlorthalidone (hypotension in combo with other meds?), ramipril (listed in allergies)  Patient is currently controlled on the following medications: amlodipine 5mg  daily, telmisartan 80mg  daily  Patient checks BP at home infrequently  Last checked couple of weeks a go prior to today. BP always less than 130 per patient   We discussed Proper blood pressure measurement technique  Plan -Continue current medications   Hyperlipidemia   Lipid Panel     Component Value Date/Time   CHOL 195 10/22/2018 1516   TRIG 168.0 (H) 10/22/2018 1516   HDL 37.90 (L) 10/22/2018 1516   CHOLHDL 5 10/22/2018 1516   VLDL 33.6 10/22/2018 1516   LDLCALC 124 (H) 10/22/2018 1516   LDLDIRECT 148.3 09/01/2010 1041     ASCVD 10-year risk: 19.7% (intermediate)  LDL Goal <100  Patient has failed these meds in past: Noned Patient is currently uncontrolled on the following medications: simvastatin 20mg  daily  Denies any side effects  We discussed:  diet and exercise extensively  Plan -Needs updated lipid panel  -Consider statin titration. Will discuss further after updated lipid panel -Continue current medications and control with diet and exercise   Depression/Anxiety    Patient has failed these meds in past: fluoxetine (not as effective), trintellix (cost) Patient is currently controlled on the following medications: bupropion 150mg  twice daily  She was increased to 200mg , only took it for a couple of days and decided she didn't need the increased dose and moved back down to 150mg .   She states she feels fine with the 150mg   tablet.  Plan -Continue current medications  -Administer PHQ9 at next visit   Hypothyroidism   TSH  Date Value Ref Range Status  10/22/2018 1.97 0.35 - 4.50 uIU/mL Final     Patient has failed these meds in past: None noted Patient is currently controlled on the following medications: levothyroxine 138mcg daily  Plan -Continue current medications   GERD    Patient has failed these meds in past:  Patient is currently stable on the following medications: famotidine 20mg  2-3 times per week, tums  Has heart burn symptoms daily, but does not wish to use heartburn medicine daily Famotidine 20mg  takes 3-4 times per week. Rarely does twice daily Uses a lot of tums  We discussed:  Letting us know if her GERD becomes more of an issue  Plan -Continue current medications   Impetigo    Patient has failed these meds in past: None noted Patient is currently  controlled on the following medications: mupiriocin 2%oint BID  Pt is using abx ointment for impetigo (mupirocin 2% oint BID, is drying up the rash. Using for 10 days then D/C)  Plan -Continue current medications and D/C when indicated   Osteopenia    01/08/19: DEXA = Osteopenia T-Score Forearm Radius: -2.2 (no statistically significant change from previous DEXA on 04/21/16  Patient has failed these meds in past: None noted Patient is currently controlled on the following medications: alendronate 70mg  weekly  Plan -Repeat DEXA in 2022 -Continue current medications   B12 Deficiency   06/08/2017: Vitamin B12 = 1,344 Patient has failed these meds in past: None noted Patient is currently above goal, updated lab needed on the following medications: None  Plan -Complete Vitamin B12 lab if agreeable to Dr. Lorelei Pont  Vitamin D Deficiency   11/03/2016: Vitamin D = 69.71  Patient has failed these meds in past: None noted Patient is currently controlled, updated lab needed on the following medications: Vitamin D3 2000  units every other Harless Molinari   Plan -Complete Vitamin D lab at next office visit -Continue current medications  Weight Loss    Has gained instead of lost the 10lbs. Weighs few times a week.  Barrier to weight loss = being stuck in the house and not being as active and busy  Patient has failed these meds in past: None noted Patient is currently uncontrolled on the following medications: None  We discussed:  diet and exercise extensively  Plan -Continue control with diet and exercise   Allergic Rhinitis    Patient has failed these meds in past: None noted Patient is currently controlled on the following medications: levocetirizine 5mg  daily, ipratropium 0.03% 2 sprays EN QID, fluticasone 59mcg 2 sprays EN daily  Last used ipratropium last week  Plan -Continue current medications  Miscellaneous Meds Clonazepam 0.5mg  - for insomnia by [place] 1/2-1 tab HS. She uses 1/4 tab

## 2019-03-09 NOTE — Patient Instructions (Signed)
Visit Information  Goals Addressed            This Visit's Progress   . Complete fasting lipid panel at next office visit      . Increase physical activity   Not on track   . LDL goal <100      . Pharmacy Care Plan       Current Barriers:  . Chronic Disease Management support, education, and care coordination needs related to HTN, HLD, Depression and anxiety, Allergic Rhinitis, Hypothyroidism, GERD, Vitamin B12 Deficiency, Vitamin D Deficiency, Osteopenia  Pharmacist Clinical Goal(s):  [name] Complete lipid panel and consider statin titration if LDL >100 . LDL goal <100 . Complete Vitamin D lab . +/- Complete Vitamin B12 lab   Interventions: . Comprehensive medication review performed.  Patient Self Care Activities:  . Patient verbalizes understanding of plan to follow as described above, Self administers medications as prescribed, Calls pharmacy for medication refills, and Calls provider office for new concerns or questions  Initial goal documentation     . Weight (lb) < 143 lb (64.9 kg)       Increase physical activity as tolerated Maintain balanced diet Lose 10lbs. Goal 143lb       Theresa Mejia was given information about Chronic Care Management services today including:  1. CCM service includes personalized support from designated clinical staff supervised by her physician, including individualized plan of care and coordination with other care providers 2. 24/7 contact phone numbers for assistance for urgent and routine care needs. 3. Service will only be billed when office clinical staff spend 20 minutes or more in a month to coordinate care. 4. Only one practitioner may furnish and bill the service in a calendar month. 5. The patient may stop CCM services at any time (effective at the end of the month) by phone call to the office staff. 6. The patient will be responsible for cost sharing (co-pay) of up to 20% of the service fee (after annual deductible is met).  Patient  agreed to services and verbal consent obtained.   The patient verbalized understanding of instructions provided today and agreed to receive a mailed copy of patient instruction and/or educational materials. Telephone follow up appointment with pharmacy team member scheduled for: 08/22/2019  Melvenia Beam Coburn Knaus, PharmD Clinical Pharmacist [facility] [phone]   Cholesterol Content in Foods Cholesterol is a waxy, fat-like substance that helps to carry fat in the blood. The body needs cholesterol in small amounts, but too much cholesterol can cause damage to the arteries and heart. Most people should eat less than 200 milligrams (mg) of cholesterol a Theresa Mejia. Foods with cholesterol  Cholesterol is found in animal-based foods, such as meat, seafood, and dairy. Generally, low-fat dairy and lean meats have less cholesterol than full-fat dairy and fatty meats. The milligrams of cholesterol per serving (mg per serving) of common cholesterol-containing foods are listed below. Meat and other proteins  Egg - one large whole egg has 186 mg.  Veal shank - 4 oz has 141 mg.  Lean ground [place] (93% lean) - 4 oz has 118 mg.  Fat-trimmed lamb loin - 4 oz has 106 mg.  Lean ground beef (90% lean) - 4 oz has 100 mg.  Lobster - 3.5 oz has 90 mg.  Pork loin chops - 4 oz has 86 mg.  Canned salmon - 3.5 oz has 83 mg.  Fat-trimmed beef top loin - 4 oz has 78 mg.  Frankfurter - 1  frank (3.5 oz) has 77 mg.  Crab - 3.5 oz has 71 mg.  Roasted chicken without skin, white meat - 4 oz has 66 mg.  Light bologna - 2 oz has 45 mg.  Deli-cut [place] - 2 oz has 31 mg.  Canned tuna - 3.5 oz has 31 mg.  Bacon - 1 oz has 29 mg.  Oysters and mussels (raw) - 3.5 oz has 25 mg.  Mackerel - 1 oz has 22 mg.  Trout - 1 oz has 20 mg.  Pork sausage - 1 link (1 oz) has 17 mg.  Salmon - 1 oz has 16 mg.  Tilapia - 1 oz has 14 mg. Dairy  Soft-serve ice cream -  cup (4 oz) has 103  mg.  Whole-milk yogurt - 1 cup (8 oz) has 29 mg.  Cheddar cheese - 1 oz has 28 mg.  American cheese - 1 oz has 28 mg.  Whole milk - 1 cup (8 oz) has 23 mg.  2% milk - 1 cup (8 oz) has 18 mg.  Cream cheese - 1 tablespoon (Tbsp) has 15 mg.  Cottage cheese -  cup (4 oz) has 14 mg.  Low-fat (1%) milk - 1 cup (8 oz) has 10 mg.  Sour cream - 1 Tbsp has 8.5 mg.  Low-fat yogurt - 1 cup (8 oz) has 8 mg.  Nonfat Greek yogurt - 1 cup (8 oz) has 7 mg.  Half-and-half cream - 1 Tbsp has 5 mg. Fats and oils  Cod liver oil - 1 tablespoon (Tbsp) has 82 mg.  Butter - 1 Tbsp has 15 mg.  Lard - 1 Tbsp has 14 mg.  Bacon grease - 1 Tbsp has 14 mg.  Mayonnaise - 1 Tbsp has 5-10 mg.  Margarine - 1 Tbsp has 3-10 mg. Exact amounts of cholesterol in these foods may vary depending on specific ingredients and brands. Foods without cholesterol Most plant-based foods do not have cholesterol unless you combine them with a food that has cholesterol. Foods without cholesterol include:  Grains and cereals.  Vegetables.  Fruits.  Vegetable oils, such as olive, canola, and sunflower oil.  Legumes, such as peas, beans, and lentils.  Nuts and seeds.  Egg whites. Summary  The body needs cholesterol in small amounts, but too much cholesterol can cause damage to the arteries and heart.  Most people should eat less than 200 milligrams (mg) of cholesterol a Theresa Mejia. This information is not intended to replace advice given to you by your health care provider. Make sure you discuss any questions you have with your health care provider. Document Revised: 12/09/2016 Document Reviewed: 08/23/2016 Elsevier Patient Education  [place].

## 2019-03-20 ENCOUNTER — Other Ambulatory Visit: Payer: PPO

## 2019-03-20 ENCOUNTER — Ambulatory Visit: Payer: PPO

## 2019-04-19 DIAGNOSIS — M19011 Primary osteoarthritis, right shoulder: Secondary | ICD-10-CM | POA: Diagnosis not present

## 2019-04-27 ENCOUNTER — Other Ambulatory Visit: Payer: Self-pay | Admitting: Family Medicine

## 2019-05-12 ENCOUNTER — Encounter: Payer: Self-pay | Admitting: Family Medicine

## 2019-05-13 MED ORDER — LEVOTHYROXINE SODIUM 125 MCG PO TABS: 125.0000 ug | ORAL_TABLET | Freq: Every day | ORAL | 1 refills | Status: DC

## 2019-05-22 ENCOUNTER — Ambulatory Visit (INDEPENDENT_AMBULATORY_CARE_PROVIDER_SITE_OTHER): Payer: PPO | Admitting: *Deleted

## 2019-05-22 DIAGNOSIS — I495 Sick sinus syndrome: Secondary | ICD-10-CM | POA: Diagnosis not present

## 2019-05-22 LAB — CUP PACEART REMOTE DEVICE CHECK
Date Time Interrogation Session: 20210511180647
Implantable Lead Implant Date: 20171101
Implantable Lead Implant Date: 20171101
Implantable Lead Location: 753859
Implantable Lead Location: 753860
Implantable Lead Model: 377
Implantable Lead Model: 377
Implantable Lead Serial Number: 49553678
Implantable Lead Serial Number: 49617393
Implantable Pulse Generator Implant Date: 20171101
Pulse Gen Model: 394969
Pulse Gen Serial Number: 68817609

## 2019-05-24 NOTE — Progress Notes (Signed)
Remote pacemaker transmission.   

## 2019-05-29 DIAGNOSIS — L820 Inflamed seborrheic keratosis: Secondary | ICD-10-CM | POA: Diagnosis not present

## 2019-05-29 DIAGNOSIS — L2089 Other atopic dermatitis: Secondary | ICD-10-CM | POA: Diagnosis not present

## 2019-05-29 DIAGNOSIS — L821 Other seborrheic keratosis: Secondary | ICD-10-CM | POA: Diagnosis not present

## 2019-06-05 ENCOUNTER — Ambulatory Visit: Payer: PPO | Admitting: Psychology

## 2019-06-09 ENCOUNTER — Other Ambulatory Visit: Payer: Self-pay | Admitting: Family Medicine

## 2019-06-09 DIAGNOSIS — F5101 Primary insomnia: Secondary | ICD-10-CM

## 2019-06-09 DIAGNOSIS — J301 Allergic rhinitis due to pollen: Secondary | ICD-10-CM

## 2019-06-12 ENCOUNTER — Encounter: Payer: Self-pay | Admitting: Family Medicine

## 2019-06-12 DIAGNOSIS — F5101 Primary insomnia: Secondary | ICD-10-CM

## 2019-06-12 MED ORDER — CLONAZEPAM 0.5 MG PO TABS: ORAL_TABLET | ORAL | 0 refills | Status: DC

## 2019-06-14 DIAGNOSIS — M79641 Pain in right hand: Secondary | ICD-10-CM | POA: Diagnosis not present

## 2019-06-14 DIAGNOSIS — M65331 Trigger finger, right middle finger: Secondary | ICD-10-CM | POA: Diagnosis not present

## 2019-06-15 ENCOUNTER — Encounter: Payer: Self-pay | Admitting: Family Medicine

## 2019-06-17 ENCOUNTER — Other Ambulatory Visit: Payer: Self-pay | Admitting: Family Medicine

## 2019-06-27 ENCOUNTER — Other Ambulatory Visit: Payer: Self-pay | Admitting: Orthopedic Surgery

## 2019-06-27 ENCOUNTER — Other Ambulatory Visit (HOSPITAL_COMMUNITY): Payer: Self-pay | Admitting: Orthopedic Surgery

## 2019-06-28 ENCOUNTER — Other Ambulatory Visit (HOSPITAL_COMMUNITY): Payer: Self-pay | Admitting: Orthopedic Surgery

## 2019-06-28 DIAGNOSIS — M25511 Pain in right shoulder: Secondary | ICD-10-CM

## 2019-07-12 ENCOUNTER — Ambulatory Visit (HOSPITAL_COMMUNITY)
Admission: RE | Admit: 2019-07-12 | Discharge: 2019-07-12 | Disposition: A | Discharge status: Home / Self Care | Payer: PPO | Source: Ambulatory Visit | Attending: Orthopedic Surgery | Admitting: Orthopedic Surgery

## 2019-07-12 ENCOUNTER — Other Ambulatory Visit: Payer: Self-pay

## 2019-07-12 DIAGNOSIS — M25511 Pain in right shoulder: Secondary | ICD-10-CM | POA: Insufficient documentation

## 2019-07-17 DIAGNOSIS — M19211 Secondary osteoarthritis, right shoulder: Secondary | ICD-10-CM | POA: Diagnosis not present

## 2019-07-18 ENCOUNTER — Other Ambulatory Visit: Payer: Self-pay | Admitting: Orthopedic Surgery

## 2019-07-18 ENCOUNTER — Telehealth: Payer: Self-pay | Admitting: Family Medicine

## 2019-07-18 ENCOUNTER — Telehealth: Payer: Self-pay | Admitting: Internal Medicine

## 2019-07-18 NOTE — Telephone Encounter (Signed)
   Primary Cardiologist: Dr. Marlou Porch / Dr. Lovena Le  Chart reviewed as part of pre-operative protocol coverage. Because of Theresa Mejia past medical history and time since last visit, they will require a follow-up visit in order to better assess preoperative cardiovascular risk.  Pre-op covering staff: - Please schedule appointment and call patient to inform them. If patient already had an upcoming appointment within acceptable timeframe, please add "pre-op clearance" to the appointment notes so provider is aware. - Please contact requesting surgeon's office via preferred method (i.e, phone, fax) to inform them of need for appointment prior to surgery.  If applicable, this message will also be routed to pharmacy pool and/or primary cardiologist for input on holding anticoagulant/antiplatelet agent as requested below so that this information is available to the clearing provider at time of patient's appointment.   Since patient will be due to annual EP visit in August anyway, please arrange a earlier EP visit to include preop clearance prior to the date of the surgery.   [place], [place]  07/18/2019, 6:11 PM

## 2019-07-18 NOTE — Telephone Encounter (Signed)
Patient states she will have surgery on July 29,2021.Patient would like Dr.Copland to know that [name] will send her Pre Surgical Forms to fill out prior to her surgery date.   Marcelo Baldy @ [facility] will send paperwork Theresa Mejia's Telephone : [phone]  Please Advise

## 2019-07-18 NOTE — Telephone Encounter (Signed)
Original clearance form just recv'd to our office from requesting provider. I will notate actual procedure and anesthesia listed on clearance form, looks clearance that was taken was from the pt calling in.  PROCEDURE: Right Reverse Total Shoulder Arthroplasty  ANESTHESIA: Is listed as Choice

## 2019-07-18 NOTE — Telephone Encounter (Signed)
FYI-will be on lookout for form.

## 2019-07-18 NOTE — Telephone Encounter (Signed)
   [facility] Pre-operative Risk Assessment    HEARTCARE STAFF: - Please ensure there is not already an duplicate clearance open for this procedure. - Under Visit Info/Reason for Call, type in Other and utilize the format Clearance MM/DD/YY or Clearance TBD. Do not use dashes or single digits. - If request is for dental extraction, please clarify the # of teeth to be extracted.  Request for surgical clearance:  1. What type of surgery is being performed? R shoulder Replacement    2. When is this surgery scheduled? 08/08/19  3. What type of clearance is required (medical clearance vs. Pharmacy clearance to hold med vs. Both)? Both  4. Are there any medications that need to be held prior to surgery and how long?TBD by Cardiology   5. Practice name and name of physician performing surgery? Dr. Tamera Punt, [place]   6. What is the office phone number? [phone] direct number to Marcelo Baldy, Surgery Coordinator    7.   What is the office fax number? [phone]  8.   Anesthesia type (None, local, MAC, general) ? General  Patient wanted to let Dr. Lovena Le know that her Surgeon should be faxing over a Clearance request sometime today.   Johnna Acosta 07/18/2019, 12:29 PM  _________________________________________________________________   (provider comments below)

## 2019-07-19 NOTE — Telephone Encounter (Signed)
Called the requesting office of [facility] and spoke with Bethena Roys the surgery coordinator to inform her that the patient [name] needs a pre op appointment for clearance and that the patient is scheduled for Friday 08/02/19. Bethena Roys asked if there was any way that the patient could get an earlier appointment or put on the wait list because the surgery is scheduled for 08/08/19. She thanked me for the call and hopes that the patient can be seen sooner.

## 2019-07-19 NOTE — Telephone Encounter (Signed)
Called the patient Theresa Mejia and she is scheduled to see Tommye Standard, PA on Friday 08/02/2019 at 11:45 AM. Patient verbalized understanding and all questions (if any) were answered.

## 2019-07-30 NOTE — Patient Instructions (Addendum)
DUE TO COVID-19 ONLY ONE VISITOR ARE ALLOWED TO COME WITH YOU AND STAY IN THE WAITING ROOM ONLY DURING PRE OP AND PROCEDURE. THEN TWO VISITORS MAY VISIT WITH YOU IN YOUR PRIVATE ROOM DURING VISITING HOURS ONLY!! (10AM-8PM)   COVID SWAB TESTING MUST BE COMPLETED ON:    Monday, 08-05-19 at 2:50 PM [address] [place] -[facility] enter pre surgical testing line (Must self quarantine after testing. Follow instructions on handout.)         Your procedure is scheduled on: Thursday, 08-08-19   Report to [facility] Main  Entrance   Report to Short Stay at 5:30 AM   [facility])    Call this number if you have problems the morning of surgery [phone]   Do not eat food :After Midnight.   May have liquids until 4:30 AM day of surgery   Complete one Ensure drink the morning of surgery at 4:30 AM the day of surgery.   CLEAR LIQUID DIET  Foods Allowed                                                                     Foods Excluded  Water, Black Coffee and tea, regular and decaf              liquids that you cannot  Plain Jell-O in any flavor  (No red)                                     see through such as: Fruit ices (not with fruit pulp)                                      milk, soups, orange juice  Iced Popsicles (No red)                                     All solid food                                   Apple juices Sports drinks like Gatorade (No red) Lightly seasoned clear broth or consume(fat free) Sugar, honey syrup  Sample Menu Breakfast                                Lunch                                     Supper Cranberry juice                    Beef broth                            Chicken broth Jell-O  Grape juice                           Apple juice Coffee or tea                        Jell-O                                      Popsicle                                                Coffee  or tea                        Coffee or tea   Oral Hygiene is also important to reduce your risk of infection.                                     Remember - BRUSH YOUR TEETH THE MORNING OF SURGERY WITH YOUR REGULAR  TOOTHPASTE   Do NOT smoke after Midnight   Take these medicines the morning of surgery with A SIP OF WATER:  Bupropion (Wellbutrin), Famotidine (Pepcid), Levothyroxine (Synthroid), Flonase nasal spray and  Atrovent inhaler                 You may not have any metal on your body including hair pins, jewelry, and body piercings               Do not wear make-up, lotions, powders, perfumes, or deodorant              Do not wear nail polish.  Do not shave  48 hours prior to surgery.    Do not bring valuables to the hospital. [place].   Contacts, dentures or bridgework may not be worn into surgery.   Patients discharged the day of surgery will not be allowed to drive home.   Special Instructions: Bring a copy of your healthcare power of attorney and living will documents          the day of surgery if you haven't scanned them in before.              Please read over the following fact sheets you were given: IF YOU HAVE QUESTIONS ABOUT YOUR  PRE OP INSTRUCTIONS PLEASE CALL [place]- Preparing for Total Shoulder Arthroplasty    Before surgery, you can play an important role. Because skin is not sterile, your skin needs to be as free of germs as possible. You can reduce the number of germs on your skin by using the following products. . Benzoyl Peroxide Gel o Reduces the number of germs present on the skin o Applied twice a day to shoulder area starting two days before surgery    ==================================================================  Please follow these instructions carefully:  BENZOYL PEROXIDE 5% GEL  Please do not use if you have an allergy to benzoyl peroxide.   If your skin becomes  reddened/irritated stop using the benzoyl peroxide.  Starting two days before surgery, apply as follows: (  Tuesday and Wednesday) 1. Apply benzoyl peroxide in the morning and at night. Apply after taking a shower. If you are not taking a shower clean entire shoulder front, back, and side along with the armpit with a clean wet washcloth.  2. Place a quarter-sized dollop on your shoulder and rub in thoroughly, making sure to cover the front, back, and side of your shoulder, along with the armpit.   2 days before ____ AM   ____ PM              1 day before ____ AM   ____ PM                         3. Do this twice a day for two days.  (Last application is the night before surgery, AFTER using the CHG soap as described below).  4. Do NOT apply benzoyl peroxide gel on the day of surgery.   [facility] - Preparing for Surgery Before surgery, you can play an important role.  Because skin is not sterile, your skin needs to be as free of germs as possible.  You can reduce the number of germs on your skin by washing with CHG (chlorahexidine gluconate) soap before surgery.  CHG is an antiseptic cleaner which kills germs and bonds with the skin to continue killing germs even after washing. Please DO NOT use if you have an allergy to CHG or antibacterial soaps.  If your skin becomes reddened/irritated stop using the CHG and inform your nurse when you arrive at Short Stay. Do not shave (including legs and underarms) for at least 48 hours prior to the first CHG shower.  You may shave your face/neck.  Please follow these instructions carefully:  1.  Shower with CHG Soap the night before surgery and the  morning of surgery.  2.  If you choose to wash your hair, wash your hair first as usual with your normal  shampoo.  3.  After you shampoo, rinse your hair and body thoroughly to remove the shampoo.                             4.  Use CHG as you would any other liquid soap.  You can apply chg directly to the  skin and wash.  Gently with a scrungie or clean washcloth.  5.  Apply the CHG Soap to your body ONLY FROM THE NECK DOWN.   Do   not use on face/ open                           Wound or open sores. Avoid contact with eyes, ears mouth and   genitals (private parts).                       Wash face,  Genitals (private parts) with your normal soap.             6.  Wash thoroughly, paying special attention to the area where your    surgery  will be performed.  7.  Thoroughly rinse your body with warm water from the neck down.  8.  DO NOT shower/wash with your normal soap after using and rinsing off the CHG Soap.                9.  Pat yourself dry with  a clean towel.            10.  Wear clean pajamas.            11.  Place clean sheets on your bed the night of your first shower and do not  sleep with pets. Day of Surgery : Do not apply any lotions/deodorants the morning of surgery.  Please wear clean clothes to the hospital/surgery center.  FAILURE TO FOLLOW THESE INSTRUCTIONS MAY RESULT IN THE CANCELLATION OF YOUR SURGERY  PATIENT SIGNATURE_________________________________  NURSE SIGNATURE__________________________________  ________________________________________________________________________   Adam Phenix  An incentive spirometer is a tool that can help keep your lungs clear and active. This tool measures how well you are filling your lungs with each breath. Taking long deep breaths may help reverse or decrease the chance of developing breathing (pulmonary) problems (especially infection) following:  A long period of time when you are unable to move or be active. BEFORE THE PROCEDURE   If the spirometer includes an indicator to show your best effort, your nurse or respiratory therapist will set it to a desired goal.  If possible, sit up straight or lean slightly forward. Try not to slouch.  Hold the incentive spirometer in an upright position. INSTRUCTIONS FOR USE  1. Sit  on the edge of your bed if possible, or sit up as far as you can in bed or on a chair. 2. Hold the incentive spirometer in an upright position. 3. Breathe out normally. 4. Place the mouthpiece in your mouth and seal your lips tightly around it. 5. Breathe in slowly and as deeply as possible, raising the piston or the ball toward the top of the column. 6. Hold your breath for 3-5 seconds or for as long as possible. Allow the piston or ball to fall to the bottom of the column. 7. Remove the mouthpiece from your mouth and breathe out normally. 8. Rest for a few seconds and repeat Steps 1 through 7 at least 10 times every 1-2 hours when you are awake. Take your time and take a few normal breaths between deep breaths. 9. The spirometer may include an indicator to show your best effort. Use the indicator as a goal to work toward during each repetition. 10. After each set of 10 deep breaths, practice coughing to be sure your lungs are clear. If you have an incision (the cut made at the time of surgery), support your incision when coughing by placing a pillow or rolled up towels firmly against it. Once you are able to get out of bed, walk around indoors and cough well. You may stop using the incentive spirometer when instructed by your caregiver.  RISKS AND COMPLICATIONS  Take your time so you do not get dizzy or light-headed.  If you are in pain, you may need to take or ask for pain medication before doing incentive spirometry. It is harder to take a deep breath if you are having pain. AFTER USE  Rest and breathe slowly and easily.  It can be helpful to keep track of a log of your progress. Your caregiver can provide you with a simple table to help with this. If you are using the spirometer at home, follow these instructions: [place] IF:   You are having difficultly using the spirometer.  You have trouble using the spirometer as often as instructed.  Your pain medication is not giving  enough relief while using the spirometer.  You develop fever of 100.5 F (  38.1 C) or higher. SEEK IMMEDIATE MEDICAL CARE IF:   You cough up bloody sputum that had not been present before.  You develop fever of 102 F (38.9 C) or greater.  You develop worsening pain at or near the incision site. MAKE SURE YOU:   Understand these instructions.  Will watch your condition.  Will get help right away if you are not doing well or get worse. Document Released: 05/09/2006 Document Revised: 03/21/2011 Document Reviewed: 07/10/2006 Exit[employer] Patient Information 2014 ExitCare, [place].   ________________________________________________________________________  WHAT IS A BLOOD TRANSFUSION? Blood Transfusion Information  A transfusion is the replacement of blood or some of its parts. Blood is made up of multiple cells which provide different functions.  Red blood cells carry oxygen and are used for blood loss replacement.  White blood cells fight against infection.  Platelets control bleeding.  Plasma helps clot blood.  Other blood products are available for specialized needs, such as hemophilia or other clotting disorders. BEFORE THE TRANSFUSION  Who gives blood for transfusions?   Healthy volunteers who are fully evaluated to make sure their blood is safe. This is blood bank blood. Transfusion therapy is the safest it has ever been in the practice of medicine. Before blood is taken from a donor, a complete history is taken to make sure that person has no history of diseases nor engages in risky social behavior (examples are intravenous drug use or sexual activity with multiple partners). The donor's travel history is screened to minimize risk of transmitting infections, such as malaria. The donated blood is tested for signs of infectious diseases, such as HIV and hepatitis. The blood is then tested to be sure it is compatible with you in order to minimize the chance of a transfusion  reaction. If you or a relative donates blood, this is often done in anticipation of surgery and is not appropriate for emergency situations. It takes many days to process the donated blood. RISKS AND COMPLICATIONS Although transfusion therapy is very safe and saves many lives, the main dangers of transfusion include:   Getting an infectious disease.  Developing a transfusion reaction. This is an allergic reaction to something in the blood you were given. Every precaution is taken to prevent this. The decision to have a blood transfusion has been considered carefully by your caregiver before blood is given. Blood is not given unless the benefits outweigh the risks. AFTER THE TRANSFUSION  Right after receiving a blood transfusion, you will usually feel much better and more energetic. This is especially true if your red blood cells have gotten low (anemic). The transfusion raises the level of the red blood cells which carry oxygen, and this usually causes an energy increase.  The nurse administering the transfusion will monitor you carefully for complications. HOME CARE INSTRUCTIONS  No special instructions are needed after a transfusion. You may find your energy is better. Speak with your caregiver about any limitations on activity for underlying diseases you may have. SEEK MEDICAL CARE IF:   Your condition is not improving after your transfusion.  You develop redness or irritation at the intravenous (IV) site. SEEK IMMEDIATE MEDICAL CARE IF:  Any of the following symptoms occur over the next 12 hours:  Shaking chills.  You have a temperature by mouth above 102 F (38.9 C), not controlled by medicine.  Chest, back, or muscle pain.  People around you feel you are not acting correctly or are confused.  Shortness of breath or difficulty breathing.  Dizziness  and fainting.  You get a rash or develop hives.  You have a decrease in urine output.  Your urine turns a dark color or  changes to pink, red, or brown. Any of the following symptoms occur over the next 10 days:  You have a temperature by mouth above 102 F (38.9 C), not controlled by medicine.  Shortness of breath.  Weakness after normal activity.  The white part of the eye turns yellow (jaundice).  You have a decrease in the amount of urine or are urinating less often.  Your urine turns a dark color or changes to pink, red, or brown. Document Released: 12/25/1999 Document Revised: 03/21/2011 Document Reviewed: 08/13/2007 [facility] Patient Information 2014 [place].

## 2019-07-30 NOTE — Progress Notes (Signed)
COVID Vaccine Completed: Date COVID Vaccine completed: COVID vaccine manufacturer: [employer]   PCP - Silvestre Mesi, MD Cardiologist -   Chest x-ray -  EKG - 09-04-18 Stress Test - NUC Stress Test 09-03-15 & Exercise Stress Test 08-27-15 ECHO -  Cardiac Cath -   Sleep Study -  CPAP -   Fasting Blood Sugar -  Checks Blood Sugar _____ times a day  Blood Thinner Instructions: Aspirin Instructions: Last Dose:  Anesthesia review:   Patient denies shortness of breath, fever, cough and chest pain at PAT appointment   Patient verbalized understanding of instructions that were given to them at the PAT appointment. Patient was also instructed that they will need to review over the PAT instructions again at home before surgery.

## 2019-07-31 ENCOUNTER — Encounter: Payer: Self-pay | Admitting: Internal Medicine

## 2019-07-31 NOTE — Progress Notes (Signed)
[facility] DEVICE PROGRAMMING  Patient Information: Name:  Theresa Mejia  DOB:  [id number]  MRN:  [id number]    Planned Procedure: Right reverse Shoulder Replacment  Surgeon: Dr. Fermin Schwab  Date of Procedure: 08/08/2019  Cautery will be used.  Position during surgery:    Please send documentation back to:  [facility] (Fax # [phone])   Marnette Burgess, RN  07/31/2019 3:11 PM   Device Information:  Clinic EP Physician:  Cristopher Peru, MD   Device Type:  Pacemaker Manufacturer and Phone #:  [place]: [phone] Pacemaker Dependent?:  No. Date of Last Device Check:  05/21/19 (remote) 09/04/19 (in-clinic) Normal Device Function?:  Yes.    Electrophysiologist's Recommendations:   Have magnet available.  Provide continuous ECG monitoring when magnet is used or reprogramming is to be performed.   Procedure will likely interfere with device function.  Device should be programmed:  Asynchronous pacing during procedure and returned to normal programming after procedure  Per Device Clinic Standing Orders, York Ram, RN  3:32 PM 07/31/2019

## 2019-08-01 ENCOUNTER — Other Ambulatory Visit: Payer: Self-pay

## 2019-08-01 ENCOUNTER — Ambulatory Visit (HOSPITAL_COMMUNITY)
Admission: RE | Admit: 2019-08-01 | Discharge: 2019-08-01 | Disposition: A | Discharge status: Home / Self Care | Payer: PPO | Source: Ambulatory Visit | Attending: Orthopedic Surgery | Admitting: Orthopedic Surgery

## 2019-08-01 ENCOUNTER — Encounter (HOSPITAL_COMMUNITY): Payer: Self-pay

## 2019-08-01 ENCOUNTER — Encounter (HOSPITAL_COMMUNITY)
Admission: RE | Admit: 2019-08-01 | Discharge: 2019-08-01 | Disposition: A | Discharge status: Home / Self Care | Payer: PPO | Source: Ambulatory Visit | Attending: Orthopedic Surgery | Admitting: Orthopedic Surgery

## 2019-08-01 DIAGNOSIS — Z79890 Hormone replacement therapy (postmenopausal): Secondary | ICD-10-CM | POA: Insufficient documentation

## 2019-08-01 DIAGNOSIS — Z79899 Other long term (current) drug therapy: Secondary | ICD-10-CM | POA: Insufficient documentation

## 2019-08-01 DIAGNOSIS — Z87891 Personal history of nicotine dependence: Secondary | ICD-10-CM | POA: Insufficient documentation

## 2019-08-01 DIAGNOSIS — M19011 Primary osteoarthritis, right shoulder: Secondary | ICD-10-CM | POA: Insufficient documentation

## 2019-08-01 DIAGNOSIS — Z01818 Encounter for other preprocedural examination: Secondary | ICD-10-CM | POA: Insufficient documentation

## 2019-08-01 DIAGNOSIS — M47814 Spondylosis without myelopathy or radiculopathy, thoracic region: Secondary | ICD-10-CM | POA: Diagnosis not present

## 2019-08-01 DIAGNOSIS — G4733 Obstructive sleep apnea (adult) (pediatric): Secondary | ICD-10-CM | POA: Diagnosis not present

## 2019-08-01 DIAGNOSIS — I10 Essential (primary) hypertension: Secondary | ICD-10-CM | POA: Insufficient documentation

## 2019-08-01 DIAGNOSIS — E039 Hypothyroidism, unspecified: Secondary | ICD-10-CM | POA: Diagnosis not present

## 2019-08-01 DIAGNOSIS — Z950 Presence of cardiac pacemaker: Secondary | ICD-10-CM | POA: Diagnosis not present

## 2019-08-01 DIAGNOSIS — M19012 Primary osteoarthritis, left shoulder: Secondary | ICD-10-CM | POA: Diagnosis not present

## 2019-08-01 DIAGNOSIS — Z01811 Encounter for preprocedural respiratory examination: Secondary | ICD-10-CM

## 2019-08-01 LAB — SURGICAL PCR SCREEN
MRSA, PCR: NEGATIVE
Staphylococcus aureus: NEGATIVE

## 2019-08-01 LAB — CBC WITH DIFFERENTIAL/PLATELET
Abs Immature Granulocytes: 0.01 10*3/uL (ref 0.00–0.07)
Basophils Absolute: 0 10*3/uL (ref 0.0–0.1)
Basophils Relative: 1 %
Eosinophils Absolute: 0.1 10*3/uL (ref 0.0–0.5)
Eosinophils Relative: 3 %
HCT: 40.5 % (ref 36.0–46.0)
Hemoglobin: 13.4 g/dL (ref 12.0–15.0)
Immature Granulocytes: 0 %
Lymphocytes Relative: 22 %
Lymphs Abs: 1.1 10*3/uL (ref 0.7–4.0)
MCH: 30.6 pg (ref 26.0–34.0)
MCHC: 33.1 g/dL (ref 30.0–36.0)
MCV: 92.5 fL (ref 80.0–100.0)
Monocytes Absolute: 0.5 10*3/uL (ref 0.1–1.0)
Monocytes Relative: 9 %
Neutro Abs: 3.3 10*3/uL (ref 1.7–7.7)
Neutrophils Relative %: 65 %
Platelets: 242 10*3/uL (ref 150–400)
RBC: 4.38 MIL/uL (ref 3.87–5.11)
RDW: 12.7 % (ref 11.5–15.5)
WBC: 5.1 10*3/uL (ref 4.0–10.5)
nRBC: 0 % (ref 0.0–0.2)

## 2019-08-01 LAB — COMPREHENSIVE METABOLIC PANEL
ALT: 17 U/L (ref 0–44)
AST: 18 U/L (ref 15–41)
Albumin: 4.4 g/dL (ref 3.5–5.0)
Alkaline Phosphatase: 36 U/L — ABNORMAL LOW (ref 38–126)
Anion gap: 8 (ref 5–15)
BUN: 29 mg/dL — ABNORMAL HIGH (ref 8–23)
CO2: 28 mmol/L (ref 22–32)
Calcium: 9.3 mg/dL (ref 8.9–10.3)
Chloride: 104 mmol/L (ref 98–111)
Creatinine, Ser: 0.84 mg/dL (ref 0.44–1.00)
GFR calc Af Amer: >60 mL/min (ref >60)
GFR calc non Af Amer: >60 mL/min (ref >60)
Glucose, Bld: 92 mg/dL (ref 70–99)
Potassium: 4 mmol/L (ref 3.5–5.1)
Sodium: 140 mmol/L (ref 135–145)
Total Bilirubin: 0.7 mg/dL (ref 0.3–1.2)
Total Protein: 7.2 g/dL (ref 6.5–8.1)

## 2019-08-01 LAB — URINALYSIS, ROUTINE W REFLEX MICROSCOPIC
Bilirubin Urine: NEGATIVE
Glucose, UA: NEGATIVE mg/dL
Hgb urine dipstick: NEGATIVE
Ketones, ur: NEGATIVE mg/dL
Leukocytes,Ua: NEGATIVE
Nitrite: NEGATIVE
Protein, ur: NEGATIVE mg/dL
Specific Gravity, Urine: 1.013 (ref 1.005–1.030)
pH: 5 (ref 5.0–8.0)

## 2019-08-01 LAB — PROTIME-INR
INR: 1 (ref 0.8–1.2)
Prothrombin Time: 13 seconds (ref 11.4–15.2)

## 2019-08-01 LAB — APTT: aPTT: 34 seconds (ref 24–36)

## 2019-08-01 NOTE — Progress Notes (Signed)
COVID Vaccine Completed: Yes Date COVID Vaccine completed: 01/24/19, 02/11/19 COVID vaccine manufacturer: [employer]       PCP - Silvestre Mesi, MD Cardiologist - Dr. Lovena Le last office visit 08/02/2019 in epic Last device check 05/31/19 in epic  Chest x-ray - greater than 1 year EKG - 09-04-18 Stress Test - NUC Stress Test 09-03-15 & Exercise Stress Test 08-27-15 ECHO - N/A Cardiac Cath -  N/A  Sleep Study - Yes CPAP - mild no CPAP   Fasting Blood Sugar - N/A Checks Blood Sugar __N/A___ times a day  Blood Thinner Instructions:  N/A Aspirin Instructions:  N/A Last Dose:  N/A  Anesthesia review: Pacemaker  Patient denies shortness of breath, fever, cough and chest pain at PAT appointment   Patient verbalized understanding of instructions that were given to them at the PAT appointment. Patient was also instructed that they will need to review over the PAT instructions again at home before surgery.

## 2019-08-01 NOTE — Progress Notes (Signed)
CMP results sent to Dr. Tamera Punt for review.

## 2019-08-02 ENCOUNTER — Ambulatory Visit: Payer: PPO | Admitting: Physician Assistant

## 2019-08-02 VITALS — BP 96/68 | HR 77 | Ht 60.0 in | Wt 151.0 lb

## 2019-08-02 DIAGNOSIS — Z01818 Encounter for other preprocedural examination: Secondary | ICD-10-CM

## 2019-08-02 DIAGNOSIS — Z950 Presence of cardiac pacemaker: Secondary | ICD-10-CM | POA: Diagnosis not present

## 2019-08-02 DIAGNOSIS — I10 Essential (primary) hypertension: Secondary | ICD-10-CM

## 2019-08-02 NOTE — Progress Notes (Signed)
Cardiology Office Note Date:  08/02/2019  Patient ID:  Theresa Mejia, Theresa Mejia 1940-09-04, MRN [id number] PCP:  Darreld Mclean, MD  Cardiologist:  Dr. Marlou Porch (last seen by gen cards service 2018) Electrophysiologist: Dr. Lovena Le    Chief Complaint:   pre-op clearance  History of Present Illness: Theresa Mejia is a 79 y.o. female with history of orthostatic dizziness, (some dizziness has been described as position and better with meclizine as well) PVCs, chronotropic incompitance w/PPM, HTN, hypothyroidism, OSA w/CPAP  She saw B. Rosita Fire, [place] in Dec 2018, her orthostasis symptoms were improved with d/c of HCTZ and reduction in her Micardis.  She was further instructed to split her Micardis in 1/2 to 20 AM and PM.  I saw her Aug 2020 for Dr. Lovena Le.  Last seen by him Aug 2019.  She was doing well without changes to her programming or meds. She reported: Since her PPM implant she has not had any further dizziness.  She feels well, no CP, palpitations or SOB.  She got her CPAP 3 mo ago though struggles to tolerate it and most nights takes it off.  No dizzy spells, no near syncope or syncope.   She gets annual labs with her PMD. Pacer function was intact, no changes were made.   TODAY She comes today again to be seen for Dr. Lovena Le for pre-op prior to R shoulder surgery. Device clinichas already provided device management recommendations.  Outside if her shoulder pain, she feels quite well.   No CP, SOB, DOE,  No exertional intolerances. No dizzy spells near syncope or syncope. She has been a little nervous about her surgery and has felt some light, fleeting palpitations, no associated symptoms.   RCRI score: zero, risk 0.4% DUKE: 8.97METS   Device information:  Biotronik dual chamber PPM, implanted 11/11/15   Past Medical History:  Diagnosis Date   Allergic rhinitis    Depression    DJD (degenerative joint disease)    GERD (gastroesophageal reflux disease)     Headache(784.0)    sinus   Hypercholesteremia    Hypersomnia    Hypertension    Hypothyroidism    Internal hemorrhoid 11/2008   s/p banding ([facility])   Lumbar disc disease    OBSTRUCTIVE SLEEP APNEA    NPSG 04/29/07- AHI 1.1/hr, RDI 21.2/hr. Weight 176 lbs. CPAP was tried based on the RDI.     OVERACTIVE BLADDER    pt unaware   Presence of permanent cardiac pacemaker 11/11/2015   Reactive depression (situational)    Sinus node dysfunction (HCC)    Spondylosis, cervical     Past Surgical History:  Procedure Laterality Date   BIOPSY BREAST     BREAST EXCISIONAL BIOPSY Left 2011   CATARACT EXTRACTION W/ INTRAOCULAR LENS IMPLANT Right    COLONOSCOPY     EP IMPLANTABLE DEVICE N/A 11/11/2015   Procedure: Pacemaker Implant;  Surgeon: Evans Lance, MD;  Location: [facility];  Service: Cardiovascular;  Laterality: N/A;   eye lid lift     INSERT / REPLACE / REMOVE PACEMAKER  11/11/2015   KNEE ARTHROSCOPY     Dr. [name]   MOUTH SURGERY  08/25/2017   implant fell out due to bone loss   rotator cuff surgery Right    Dr. [name]   TUBAL LIGATION      Current Outpatient Medications  Medication Sig Dispense Refill   amLODipine (NORVASC) 5 MG tablet TAKE 1 TABLET BY MOUTH EVERY DAY (Patient taking  differently: Take 5 mg by mouth at bedtime. ) 90 tablet 1   buPROPion (WELLBUTRIN SR) 150 MG 12 hr tablet TAKE 1 TABLET BY MOUTH TWICE A DAY (Patient taking differently: Take 150 mg by mouth 2 (two) times daily. ) 180 tablet 1   calcium carbonate (TUMS - DOSED IN MG ELEMENTAL CALCIUM) 500 MG chewable tablet Chew 1 tablet by mouth 4 (four) times daily as needed for indigestion or heartburn.     Cholecalciferol (VITAMIN D3) 50 MCG (2000 UT) TABS Take 2,000 Units by mouth every other day.      clonazePAM (KLONOPIN) 0.5 MG tablet Take 1/2 at bedtime as needed for insomnia, may increase to a whole tablet if needed (Patient taking differently: Take 0.25-0.5 mg by mouth  at bedtime as needed (sleep). ) 30 tablet 0   Cyanocobalamin (B-12) 5000 MCG CAPS Take 5,000 mcg by mouth every other day.     famotidine (PEPCID) 10 MG tablet Take 10 mg by mouth daily as needed for heartburn or indigestion.     fluticasone (FLONASE) 50 MCG/ACT nasal spray SPRAY 2 SPRAYS INTO EACH NOSTRIL EVERY DAY (Patient taking differently: Place 2 sprays into both nostrils daily. ) 48 mL 1   ibuprofen (ADVIL) 200 MG tablet Take 400 mg by mouth every 6 (six) hours as needed for headache or moderate pain.     ipratropium (ATROVENT) 0.03 % nasal spray PLACE 2 SPRAYS INTO THE NOSE 4 (FOUR) TIMES DAILY. USE AS NEEDED FOR POST NASAL DRIP (Patient taking differently: Place 2 sprays into both nostrils daily. ) 30 mL 6   levocetirizine (XYZAL) 5 MG tablet TAKE 1 TABLET BY MOUTH EVERY DAY IN THE EVENING (Patient taking differently: Take 5 mg by mouth daily. ) 90 tablet 3   levothyroxine (SYNTHROID) 125 MCG tablet Take 1 tablet (125 mcg total) by mouth daily. (Patient taking differently: Take 62.5 mcg by mouth daily. ) 90 tablet 1   Polyvinyl Alcohol-Povidone (REFRESH OP) Place 1 drop into both eyes daily as needed (dry eyes).     simvastatin (ZOCOR) 20 MG tablet Take 1 tablet (20 mg total) by mouth at bedtime. 90 tablet 3   telmisartan (MICARDIS) 80 MG tablet TAKE 1 TABLET BY MOUTH EVERY DAY (Patient taking differently: Take 80 mg by mouth daily. ) 90 tablet 3   No current facility-administered medications for this visit.    Allergies:   Ramipril, Sulfa antibiotics, and Sulfonamide derivatives   Social History:  The patient  reports that she quit smoking about 53 years ago. Her smoking use included cigarettes. She quit after 8.00 years of use. She has never used smokeless tobacco. She reports that she does not drink alcohol and does not use drugs.   Family History:  The patient's family history includes Alzheimer's disease in her paternal aunt and paternal uncle; Cancer (age of onset: 76) in  her mother; Crohn's disease (age of onset: 36) in her father; Diabetes in her paternal grandmother; Hypothyroidism in her mother; Macular degeneration in her mother; Parkinsonism in her maternal grandmother; Stroke (age of onset: 27) in her mother; Tremor in her mother.  ROS:  Please see the history of present illness.  All other systems are reviewed and otherwise negative.   PHYSICAL EXAM:  VS:  BP 96/68    Pulse 77    Ht 5' (1.524 m)    Wt 151 lb (68.5 kg)    SpO2 94%    BMI 29.49 kg/m  BMI: Body mass index is 29.49  kg/m. Well nourished, well developed, in no acute distress  HEENT: normocephalic, atraumatic  Neck: no JVD, carotid bruits or masses Cardiac:  RRR; no significant murmurs, no rubs, or gallops Lungs:  CTA b/l, no wheezing, rhonchi or rales  Abd: soft, nontender MS: no deformity or atrophy Ext: no edema  Skin: warm and dry, no rash Neuro:  No gross deficits appreciated Psych: euthymic mood, full affect  PPM site is stable, no tethering or discomfort   EKG:  Done today and reviewed by myself shows  AP/VS 77bpm   PPM interrogation done today and reviewed by myself:  Battery and lead measurements are good One mode switch, no EGMs or data (<10seconds) Programmed DDD-CLS 70/120   08/27/15: TTE Study Conclusions - Left ventricle: The cavity size was normal. Wall thickness was   normal. Systolic function was normal. The estimated ejection   fraction was in the range of 55% to 60%. Wall motion was normal;   there were no regional wall motion abnormalities. Left   ventricular diastolic function parameters were normal. - Aortic valve: There was trivial regurgitation. - Mitral valve: There was mild regurgitation. - Left atrium: The atrium was mildly dilated. - Atrial septum: No defect or patent foramen ovale was identified. - Impressions: Normal GLS -22.7.  Impressions: - Normal GLS -22.7.   09/03/15: Lexiscan stress test  Nuclear stress EF: 64%.  Defect 1:  There is a small defect of mild severity present in the apex location.  The study is normal.  This is a low risk study.  The left ventricular ejection fraction is normal (55-65%).     Recent Labs: 10/22/2018: Magnesium 2.0; TSH 1.97 08/01/2019: ALT 17; BUN 29; Creatinine, Ser 0.84; Hemoglobin 13.4; Platelets 242; Potassium 4.0; Sodium 140  10/22/2018: Cholesterol 195; HDL 37.90; LDL Cholesterol 124; Total CHOL/HDL Ratio 5; Triglycerides 168.0; VLDL 33.6   Estimated Creatinine Clearance: 49.2 mL/min (by C-G formula based on SCr of 0.84 mg/dL).   Wt Readings from Last 3 Encounters:  08/02/19 151 lb (68.5 kg)  10/22/18 152 lb (68.9 kg)  09/19/18 152 lb 6.4 oz (69.1 kg)     Other studies reviewed: Additional studies/records reviewed today include: summarized above  ASSESSMENT AND PLAN:  1. PPM     Intact function, no programming changes made  2. HTN     no changes  4. Pre-op     She is not device dependent with SR underlying 50's-60's     programmed rate is likely for PVC suppression     Agree with device rep management per-operatively     No cardiac contraindication to her shoulder surgery planned   Disposition: device remotes Q 64mo, in clinic in 1 year, sooner if needed   Current medicines are reviewed at length with the patient today.  The patient did not have any concerns regarding medicines.  Venetia Night, PA-C 08/02/2019 12:42 PM     [place] [address] [place] [id number] [phone] (office)  [phone] (fax)

## 2019-08-02 NOTE — Patient Instructions (Signed)

## 2019-08-05 ENCOUNTER — Other Ambulatory Visit (HOSPITAL_COMMUNITY)
Admission: RE | Admit: 2019-08-05 | Discharge: 2019-08-05 | Disposition: A | Discharge status: Home / Self Care | Payer: PPO | Source: Ambulatory Visit | Attending: Orthopedic Surgery | Admitting: Orthopedic Surgery

## 2019-08-05 DIAGNOSIS — Z01812 Encounter for preprocedural laboratory examination: Secondary | ICD-10-CM | POA: Insufficient documentation

## 2019-08-05 DIAGNOSIS — Z20822 Contact with and (suspected) exposure to covid-19: Secondary | ICD-10-CM | POA: Insufficient documentation

## 2019-08-05 LAB — SARS CORONAVIRUS 2 (TAT 6-24 HRS): SARS Coronavirus 2: NEGATIVE

## 2019-08-05 NOTE — Progress Notes (Signed)
Anesthesia Chart Review   Case: [id number] Date/Time: 08/08/19 0715   Procedure: REVERSE SHOULDER ARTHROPLASTY (Right Shoulder)   Anesthesia type: Choice   Pre-op diagnosis: RIGHT SHOULDER ARTHRITIS WITH ROTATOR CUFF DISEASE   Location: [facility] 07 / [facility]   Surgeons: Tania Ade, MD      DISCUSSION:78 y.o. former smoker (quit 01/10/66) with h/o HTN, hypothyroidism, OSA with CPAP, GERD, PVCs, chronotropic incompetence s/p Biotronik dual chamber PPM implant (device orders on chart), right shoulder arthritis scheduled for above procedure 08/08/2019 with Dr. Tania Ade.   Pt seen by EP 08/02/2019.  Per OV note, "She is not device dependent with SR underlying 50's-60's     programmed rate is likely for PVC suppression     Agree with device rep management per-operatively     No cardiac contraindication to her shoulder surgery planned."  Anticipate pt can proceed with planned procedure barring acute status change.   VS: There were no vitals taken for this visit.  PROVIDERS: Copland, Gay Filler, MD is PCP   [name], MD is Cardiologist   Cristopher Peru, MD is EP LABS: Labs reviewed: Acceptable for surgery. (all labs ordered are listed, but only abnormal results are displayed)  Labs Reviewed  COMPREHENSIVE METABOLIC PANEL - Abnormal; Notable for the following components:      Result Value   BUN 29 (*)    Alkaline Phosphatase 36 (*)    All other components within normal limits  SURGICAL PCR SCREEN  APTT  CBC WITH DIFFERENTIAL/PLATELET  PROTIME-INR  URINALYSIS, ROUTINE W REFLEX MICROSCOPIC  TYPE AND SCREEN     IMAGES:   EKG: 09/04/2018 Rate 81 bpm Atrial-paced rhythm  Left axis deviation  Inferior infarct, age undetermined Anteroseptal infarct, age undetermined   CV: Myocardial Perfusion 09/03/2015  Nuclear stress EF: 64%.  Defect 1: There is a small defect of mild severity present in the apex location.  The study is normal.  This is a low risk study.  The  left ventricular ejection fraction is normal (55-65%).  Echo 08/27/2015 Study Conclusions   - Left ventricle: The cavity size was normal. Wall thickness was  normal. Systolic function was normal. The estimated ejection  fraction was in the range of 55% to 60%. Wall motion was normal;  there were no regional wall motion abnormalities. Left  ventricular diastolic function parameters were normal.  - Aortic valve: There was trivial regurgitation.  - Mitral valve: There was mild regurgitation.  - Left atrium: The atrium was mildly dilated.  - Atrial septum: No defect or patent foramen ovale was identified.  - Impressions: Normal GLS -22.7.  Past Medical History:  Diagnosis Date  . Allergic rhinitis   . Depression   . DJD (degenerative joint disease)   . GERD (gastroesophageal reflux disease)   . Headache(784.0)    sinus  . Hypercholesteremia   . Hypersomnia   . Hypertension   . Hypothyroidism   . Internal hemorrhoid 11/2008   s/p banding ([facility])  . Lumbar disc disease   . OBSTRUCTIVE SLEEP APNEA    NPSG 04/29/07- AHI 1.1/hr, RDI 21.2/hr. Weight 176 lbs. CPAP was tried based on the RDI.    [name] OVERACTIVE BLADDER    pt unaware  . Presence of permanent cardiac pacemaker 11/11/2015  . Reactive depression (situational)   . Sinus node dysfunction (HCC)   . Spondylosis, cervical     Past Surgical History:  Procedure Laterality Date  . BIOPSY BREAST    . BREAST EXCISIONAL BIOPSY Left  2011  . CATARACT EXTRACTION W/ INTRAOCULAR LENS IMPLANT Right   . COLONOSCOPY    . EP IMPLANTABLE DEVICE N/A 11/11/2015   Procedure: Pacemaker Implant;  Surgeon: Evans Lance, MD;  Location: [facility];  Service: Cardiovascular;  Laterality: N/A;  . eye lid lift    . INSERT / REPLACE / REMOVE PACEMAKER  11/11/2015  . KNEE ARTHROSCOPY     Dr. [name]  . MOUTH SURGERY  08/25/2017   implant fell out due to bone loss  . rotator cuff surgery Right    Dr. [name]  . TUBAL LIGATION       MEDICATIONS: . amLODipine (NORVASC) 5 MG tablet  . buPROPion (WELLBUTRIN SR) 150 MG 12 hr tablet  . calcium carbonate (TUMS - DOSED IN MG ELEMENTAL CALCIUM) 500 MG chewable tablet  . Cholecalciferol (VITAMIN D3) 50 MCG (2000 UT) TABS  . clonazePAM (KLONOPIN) 0.5 MG tablet  . Cyanocobalamin (B-12) 5000 MCG CAPS  . famotidine (PEPCID) 10 MG tablet  . fluticasone (FLONASE) 50 MCG/ACT nasal spray  . ibuprofen (ADVIL) 200 MG tablet  . ipratropium (ATROVENT) 0.03 % nasal spray  . levocetirizine (XYZAL) 5 MG tablet  . levothyroxine (SYNTHROID) 125 MCG tablet  . Polyvinyl Alcohol-Povidone (REFRESH OP)  . simvastatin (ZOCOR) 20 MG tablet  . telmisartan (MICARDIS) 80 MG tablet   No current facility-administered medications for this encounter.     Konrad Felix, PA-C [facility] [phone]

## 2019-08-05 NOTE — Anesthesia Preprocedure Evaluation (Addendum)
Anesthesia Evaluation  Patient identified by MRN, date of birth, ID band Patient awake    Reviewed: Allergy & Precautions, NPO status , Patient's Chart, lab work & pertinent test results  Airway Mallampati: III  TM Distance: >3 FB Neck ROM: Full    Dental  (+) Teeth Intact, Dental Advisory Given   Pulmonary sleep apnea , COPD, former smoker,    Pulmonary exam normal breath sounds clear to auscultation       Cardiovascular hypertension, Pt. on medications Normal cardiovascular exam+ pacemaker  Rhythm:Regular Rate:Normal     Neuro/Psych  Headaches, PSYCHIATRIC DISORDERS Anxiety Depression    GI/Hepatic Neg liver ROS, GERD  Medicated,  Endo/Other  Hypothyroidism   Renal/GU negative Renal ROS     Musculoskeletal  (+) Arthritis  (RIGHT SHOULDER ARTHRITIS WITH ROTATOR CUFF DISEASE), Osteoarthritis,    Abdominal   Peds  Hematology negative hematology ROS (+)   Anesthesia Other Findings Day of surgery medications reviewed with the patient.  Reproductive/Obstetrics                           Anesthesia Physical Anesthesia Plan  ASA: III  Anesthesia Plan: General   Post-op Pain Management:  Regional for Post-op pain   Induction: Intravenous  PONV Risk Score and Plan: 3 and Dexamethasone, Ondansetron and Treatment may vary due to age or medical condition  Airway Management Planned: Oral ETT  Additional Equipment:   Intra-op Plan:   Post-operative Plan: Extubation in OR  Informed Consent: I have reviewed the patients History and Physical, chart, labs and discussed the procedure including the risks, benefits and alternatives for the proposed anesthesia with the patient or authorized representative who has indicated his/her understanding and acceptance.     Dental advisory given  Plan Discussed with: CRNA  Anesthesia Plan Comments: (See PAT note 08/01/2019, Konrad Felix, PA-C)        Anesthesia Quick Evaluation

## 2019-08-06 ENCOUNTER — Other Ambulatory Visit: Payer: Self-pay

## 2019-08-07 NOTE — Progress Notes (Signed)
Patient advised that surgery time on 08/08/19 has from 7:30-100 to 9:40 to 11:40.  Patient to arrive to admitting at 7:10 AM and to drink Ensure drink at 6:30.  Patient stated understanding.

## 2019-08-08 ENCOUNTER — Ambulatory Visit (HOSPITAL_COMMUNITY): Payer: PPO | Admitting: Physician Assistant

## 2019-08-08 ENCOUNTER — Encounter (HOSPITAL_COMMUNITY): Payer: Self-pay | Admitting: Orthopedic Surgery

## 2019-08-08 ENCOUNTER — Ambulatory Visit (HOSPITAL_COMMUNITY)
Admission: RE | Admit: 2019-08-08 | Discharge: 2019-08-08 | Disposition: A | Discharge status: Home / Self Care | Payer: PPO | Attending: Orthopedic Surgery | Admitting: Orthopedic Surgery

## 2019-08-08 ENCOUNTER — Encounter (HOSPITAL_COMMUNITY)
Admission: RE | Disposition: A | Discharge status: Home / Self Care | Payer: Self-pay | Source: Home / Self Care | Attending: Orthopedic Surgery

## 2019-08-08 ENCOUNTER — Ambulatory Visit (HOSPITAL_COMMUNITY): Payer: PPO | Admitting: Certified Registered"

## 2019-08-08 ENCOUNTER — Ambulatory Visit (HOSPITAL_COMMUNITY): Payer: PPO

## 2019-08-08 DIAGNOSIS — Z8052 Family history of malignant neoplasm of bladder: Secondary | ICD-10-CM | POA: Diagnosis not present

## 2019-08-08 DIAGNOSIS — Z96611 Presence of right artificial shoulder joint: Secondary | ICD-10-CM

## 2019-08-08 DIAGNOSIS — G8918 Other acute postprocedural pain: Secondary | ICD-10-CM | POA: Diagnosis not present

## 2019-08-08 DIAGNOSIS — E7800 Pure hypercholesterolemia, unspecified: Secondary | ICD-10-CM | POA: Diagnosis not present

## 2019-08-08 DIAGNOSIS — M47812 Spondylosis without myelopathy or radiculopathy, cervical region: Secondary | ICD-10-CM | POA: Insufficient documentation

## 2019-08-08 DIAGNOSIS — F419 Anxiety disorder, unspecified: Secondary | ICD-10-CM | POA: Diagnosis not present

## 2019-08-08 DIAGNOSIS — Z950 Presence of cardiac pacemaker: Secondary | ICD-10-CM | POA: Insufficient documentation

## 2019-08-08 DIAGNOSIS — N3281 Overactive bladder: Secondary | ICD-10-CM | POA: Insufficient documentation

## 2019-08-08 DIAGNOSIS — Z961 Presence of intraocular lens: Secondary | ICD-10-CM | POA: Diagnosis not present

## 2019-08-08 DIAGNOSIS — Z882 Allergy status to sulfonamides status: Secondary | ICD-10-CM | POA: Insufficient documentation

## 2019-08-08 DIAGNOSIS — I10 Essential (primary) hypertension: Secondary | ICD-10-CM | POA: Diagnosis not present

## 2019-08-08 DIAGNOSIS — M8580 Other specified disorders of bone density and structure, unspecified site: Secondary | ICD-10-CM | POA: Insufficient documentation

## 2019-08-08 DIAGNOSIS — J449 Chronic obstructive pulmonary disease, unspecified: Secondary | ICD-10-CM | POA: Diagnosis not present

## 2019-08-08 DIAGNOSIS — K219 Gastro-esophageal reflux disease without esophagitis: Secondary | ICD-10-CM | POA: Insufficient documentation

## 2019-08-08 DIAGNOSIS — Z8379 Family history of other diseases of the digestive system: Secondary | ICD-10-CM | POA: Diagnosis not present

## 2019-08-08 DIAGNOSIS — Z8349 Family history of other endocrine, nutritional and metabolic diseases: Secondary | ICD-10-CM | POA: Insufficient documentation

## 2019-08-08 DIAGNOSIS — Z820 Family history of epilepsy and other diseases of the nervous system: Secondary | ICD-10-CM | POA: Diagnosis not present

## 2019-08-08 DIAGNOSIS — Z87891 Personal history of nicotine dependence: Secondary | ICD-10-CM | POA: Diagnosis not present

## 2019-08-08 DIAGNOSIS — Z471 Aftercare following joint replacement surgery: Secondary | ICD-10-CM | POA: Diagnosis not present

## 2019-08-08 DIAGNOSIS — E039 Hypothyroidism, unspecified: Secondary | ICD-10-CM | POA: Insufficient documentation

## 2019-08-08 DIAGNOSIS — G4710 Hypersomnia, unspecified: Secondary | ICD-10-CM | POA: Diagnosis not present

## 2019-08-08 DIAGNOSIS — M19011 Primary osteoarthritis, right shoulder: Secondary | ICD-10-CM | POA: Insufficient documentation

## 2019-08-08 DIAGNOSIS — Z79899 Other long term (current) drug therapy: Secondary | ICD-10-CM | POA: Diagnosis not present

## 2019-08-08 DIAGNOSIS — F329 Major depressive disorder, single episode, unspecified: Secondary | ICD-10-CM | POA: Diagnosis not present

## 2019-08-08 DIAGNOSIS — Z791 Long term (current) use of non-steroidal anti-inflammatories (NSAID): Secondary | ICD-10-CM | POA: Diagnosis not present

## 2019-08-08 DIAGNOSIS — Z823 Family history of stroke: Secondary | ICD-10-CM | POA: Insufficient documentation

## 2019-08-08 DIAGNOSIS — M75121 Complete rotator cuff tear or rupture of right shoulder, not specified as traumatic: Secondary | ICD-10-CM | POA: Diagnosis not present

## 2019-08-08 DIAGNOSIS — G4730 Sleep apnea, unspecified: Secondary | ICD-10-CM | POA: Diagnosis not present

## 2019-08-08 DIAGNOSIS — Z888 Allergy status to other drugs, medicaments and biological substances status: Secondary | ICD-10-CM | POA: Insufficient documentation

## 2019-08-08 DIAGNOSIS — R519 Headache, unspecified: Secondary | ICD-10-CM | POA: Diagnosis not present

## 2019-08-08 DIAGNOSIS — Z9841 Cataract extraction status, right eye: Secondary | ICD-10-CM | POA: Diagnosis not present

## 2019-08-08 DIAGNOSIS — G4733 Obstructive sleep apnea (adult) (pediatric): Secondary | ICD-10-CM | POA: Insufficient documentation

## 2019-08-08 DIAGNOSIS — Z833 Family history of diabetes mellitus: Secondary | ICD-10-CM | POA: Insufficient documentation

## 2019-08-08 HISTORY — PX: REVERSE SHOULDER ARTHROPLASTY: SHX5054

## 2019-08-08 LAB — TYPE AND SCREEN
ABO/RH(D): A POS
Antibody Screen: NEGATIVE

## 2019-08-08 LAB — ABO/RH: ABO/RH(D): A POS

## 2019-08-08 SURGERY — ARTHROPLASTY, SHOULDER, TOTAL, REVERSE
Anesthesia: General | Site: Shoulder | Laterality: Right

## 2019-08-08 MED ORDER — PROPOFOL 10 MG/ML IV BOLUS
INTRAVENOUS | Status: AC
  Filled 2019-08-08: qty 20

## 2019-08-08 MED ORDER — CHLORHEXIDINE GLUCONATE 0.12 % MT SOLN
15.0000 mL | Freq: Once | OROMUCOSAL | Status: AC
  Administered 2019-08-08: 15 mL via OROMUCOSAL

## 2019-08-08 MED ORDER — BUPIVACAINE HCL (PF) 0.5 % IJ SOLN
INTRAMUSCULAR | Status: DC | PRN
  Administered 2019-08-08: 10 mL via PERINEURAL

## 2019-08-08 MED ORDER — DEXAMETHASONE SODIUM PHOSPHATE 10 MG/ML IJ SOLN
INTRAMUSCULAR | Status: AC
  Filled 2019-08-08: qty 1

## 2019-08-08 MED ORDER — DEXAMETHASONE SODIUM PHOSPHATE 10 MG/ML IJ SOLN
INTRAMUSCULAR | Status: DC | PRN
  Administered 2019-08-08: 5 mg via INTRAVENOUS

## 2019-08-08 MED ORDER — PHENYLEPHRINE 40 MCG/ML (10ML) SYRINGE FOR IV PUSH (FOR BLOOD PRESSURE SUPPORT)
PREFILLED_SYRINGE | INTRAVENOUS | Status: DC | PRN
  Administered 2019-08-08 (×2): 40 ug via INTRAVENOUS
  Administered 2019-08-08: 80 ug via INTRAVENOUS
  Administered 2019-08-08: 40 ug via INTRAVENOUS
  Administered 2019-08-08: 80 ug via INTRAVENOUS

## 2019-08-08 MED ORDER — DEXMEDETOMIDINE HCL IN NACL 200 MCG/50ML IV SOLN
INTRAVENOUS | Status: AC
  Filled 2019-08-08: qty 100

## 2019-08-08 MED ORDER — TRANEXAMIC ACID-NACL 1000-0.7 MG/100ML-% IV SOLN
1000.0000 mg | INTRAVENOUS | Status: AC
  Administered 2019-08-08: 1000 mg via INTRAVENOUS
  Filled 2019-08-08: qty 100

## 2019-08-08 MED ORDER — MIDAZOLAM HCL 2 MG/2ML IJ SOLN: 1.0000 mg | INTRAMUSCULAR | Status: DC

## 2019-08-08 MED ORDER — 0.9 % SODIUM CHLORIDE (POUR BTL) OPTIME
TOPICAL | Status: DC | PRN
  Administered 2019-08-08: 1000 mL

## 2019-08-08 MED ORDER — LIDOCAINE 2% (20 MG/ML) 5 ML SYRINGE
INTRAMUSCULAR | Status: DC | PRN
  Administered 2019-08-08: 60 mg via INTRAVENOUS

## 2019-08-08 MED ORDER — ONDANSETRON HCL 4 MG/2ML IJ SOLN
INTRAMUSCULAR | Status: AC
  Filled 2019-08-08: qty 2

## 2019-08-08 MED ORDER — LIDOCAINE 2% (20 MG/ML) 5 ML SYRINGE
INTRAMUSCULAR | Status: AC
  Filled 2019-08-08: qty 5

## 2019-08-08 MED ORDER — HYDROCODONE-ACETAMINOPHEN 5-325 MG PO TABS: 1.0000 | ORAL_TABLET | ORAL | 0 refills | Status: DC | PRN

## 2019-08-08 MED ORDER — MIDAZOLAM HCL 2 MG/2ML IJ SOLN
INTRAMUSCULAR | Status: AC
  Administered 2019-08-08: 1 mg via INTRAVENOUS
  Filled 2019-08-08: qty 2

## 2019-08-08 MED ORDER — WATER FOR IRRIGATION, STERILE IR SOLN
Status: DC | PRN
  Administered 2019-08-08: 2000 mL

## 2019-08-08 MED ORDER — PHENYLEPHRINE HCL (PRESSORS) 10 MG/ML IV SOLN
INTRAVENOUS | Status: AC
  Filled 2019-08-08: qty 1

## 2019-08-08 MED ORDER — PHENYLEPHRINE HCL-NACL 10-0.9 MG/250ML-% IV SOLN
INTRAVENOUS | Status: DC | PRN
  Administered 2019-08-08: 20 ug/min via INTRAVENOUS

## 2019-08-08 MED ORDER — FENTANYL CITRATE (PF) 100 MCG/2ML IJ SOLN: 50.0000 ug | INTRAMUSCULAR | Status: DC

## 2019-08-08 MED ORDER — ROCURONIUM BROMIDE 100 MG/10ML IV SOLN
INTRAVENOUS | Status: DC | PRN
  Administered 2019-08-08: 60 mg via INTRAVENOUS
  Administered 2019-08-08: 10 mg via INTRAVENOUS

## 2019-08-08 MED ORDER — ORAL CARE MOUTH RINSE: 15.0000 mL | Freq: Once | OROMUCOSAL | Status: AC

## 2019-08-08 MED ORDER — ACETAMINOPHEN 500 MG PO TABS
1000.0000 mg | ORAL_TABLET | Freq: Once | ORAL | Status: AC
  Administered 2019-08-08: 1000 mg via ORAL
  Filled 2019-08-08: qty 2

## 2019-08-08 MED ORDER — FENTANYL CITRATE (PF) 100 MCG/2ML IJ SOLN: 25.0000 ug | INTRAMUSCULAR | Status: DC | PRN

## 2019-08-08 MED ORDER — BUPIVACAINE LIPOSOME 1.3 % IJ SUSP
INTRAMUSCULAR | Status: DC | PRN
Start: 2019-08-08 — End: 2019-08-08
  Administered 2019-08-08: 10 mL via PERINEURAL

## 2019-08-08 MED ORDER — ONDANSETRON HCL 4 MG/2ML IJ SOLN: 4.0000 mg | Freq: Once | INTRAMUSCULAR | Status: DC | PRN

## 2019-08-08 MED ORDER — SODIUM CHLORIDE 0.9 % IR SOLN
Status: DC | PRN
  Administered 2019-08-08: 1000 mL

## 2019-08-08 MED ORDER — LACTATED RINGERS IV SOLN: INTRAVENOUS | Status: DC

## 2019-08-08 MED ORDER — FENTANYL CITRATE (PF) 100 MCG/2ML IJ SOLN
INTRAMUSCULAR | Status: AC
  Administered 2019-08-08: 50 ug via INTRAVENOUS
  Filled 2019-08-08: qty 2

## 2019-08-08 MED ORDER — CEFAZOLIN SODIUM-DEXTROSE 2-4 GM/100ML-% IV SOLN
2.0000 g | INTRAVENOUS | Status: AC
  Administered 2019-08-08: 2 g via INTRAVENOUS
  Filled 2019-08-08: qty 100

## 2019-08-08 MED ORDER — SUGAMMADEX SODIUM 200 MG/2ML IV SOLN
INTRAVENOUS | Status: DC | PRN
  Administered 2019-08-08: 200 mg via INTRAVENOUS

## 2019-08-08 MED ORDER — ONDANSETRON HCL 4 MG/2ML IJ SOLN
INTRAMUSCULAR | Status: DC | PRN
  Administered 2019-08-08: 4 mg via INTRAVENOUS

## 2019-08-08 MED ORDER — ROCURONIUM BROMIDE 10 MG/ML (PF) SYRINGE
PREFILLED_SYRINGE | INTRAVENOUS | Status: AC
  Filled 2019-08-08: qty 10

## 2019-08-08 MED ORDER — TIZANIDINE HCL 4 MG PO TABS: 4.0000 mg | ORAL_TABLET | Freq: Three times a day (TID) | ORAL | 1 refills | Status: DC | PRN

## 2019-08-08 MED ORDER — PROPOFOL 10 MG/ML IV BOLUS
INTRAVENOUS | Status: DC | PRN
  Administered 2019-08-08: 130 mg via INTRAVENOUS

## 2019-08-08 SURGICAL SUPPLY — 78 items
AID PSTN UNV HD RSTRNT DISP (MISCELLANEOUS)
BAG SPEC THK2 15X12 ZIP CLS (MISCELLANEOUS) ×1
BAG ZIPLOCK 12X15 (MISCELLANEOUS) ×3 IMPLANT
BASEPLATE P2 COATD GLND 6.5X30 (Shoulder) ×1 IMPLANT
BIT DRILL 1.6MX128 (BIT) IMPLANT
BIT DRILL 1.6MX128MM (BIT)
BIT DRILL 2.5 DIA 127 CALI (BIT) ×3 IMPLANT
BIT DRILL 4 DIA CALIBRATED (BIT) ×3 IMPLANT
BLADE SAW SAG 73X25 THK (BLADE) ×2
BLADE SAW SGTL 73X25 THK (BLADE) ×1 IMPLANT
BOOTIES KNEE HIGH SLOAN (MISCELLANEOUS) ×6 IMPLANT
BSPLAT GLND 30 STRL LF SHLDR (Shoulder) ×1 IMPLANT
CLOSURE WOUND 1/2 X4 (GAUZE/BANDAGES/DRESSINGS) ×1
COOLER ICEMAN CLASSIC (MISCELLANEOUS) IMPLANT
COVER BACK TABLE 60X90IN (DRAPES) ×3 IMPLANT
COVER SURGICAL LIGHT HANDLE (MISCELLANEOUS) ×3 IMPLANT
COVER WAND RF STERILE (DRAPES) IMPLANT
DRAPE INCISE IOBAN 66X45 STRL (DRAPES) ×3 IMPLANT
DRAPE ORTHO SPLIT 77X108 STRL (DRAPES)
DRAPE POUCH INSTRU U-SHP 10X18 (DRAPES) ×3 IMPLANT
DRAPE SHEET LG 3/4 BI-LAMINATE (DRAPES) ×6 IMPLANT
DRAPE SURG 17X11 SM STRL (DRAPES) ×3 IMPLANT
DRAPE SURG ORHT 6 SPLT 77X108 (DRAPES) IMPLANT
DRAPE U-SHAPE 47X51 STRL (DRAPES) ×3 IMPLANT
DRSG AQUACEL AG ADV 3.5X 6 (GAUZE/BANDAGES/DRESSINGS) ×3 IMPLANT
DURAPREP 26ML APPLICATOR (WOUND CARE) ×3 IMPLANT
ELECT BLADE TIP CTD 4 INCH (ELECTRODE) ×3 IMPLANT
ELECT REM PT RETURN 15FT ADLT (MISCELLANEOUS) ×3 IMPLANT
FACESHIELD WRAPAROUND (MASK) IMPLANT
GLOVE BIO SURGEON STRL SZ7 (GLOVE) ×3 IMPLANT
GLOVE BIO SURGEON STRL SZ7.5 (GLOVE) ×3 IMPLANT
GLOVE BIOGEL PI IND STRL 7.0 (GLOVE) ×1 IMPLANT
GLOVE BIOGEL PI IND STRL 8 (GLOVE) ×1 IMPLANT
GLOVE BIOGEL PI INDICATOR 7.0 (GLOVE) ×2
GLOVE BIOGEL PI INDICATOR 8 (GLOVE) ×2
GOWN STRL REUS W/TWL LRG LVL3 (GOWN DISPOSABLE) ×3 IMPLANT
GOWN STRL REUS W/TWL XL LVL3 (GOWN DISPOSABLE) ×3 IMPLANT
HANDPIECE INTERPULSE COAX TIP (DISPOSABLE) ×3
HOOD PEEL AWAY FLYTE STAYCOOL (MISCELLANEOUS) ×9 IMPLANT
HUMERA STEM SM SHELL SHOU 10 (Miscellaneous) ×3 IMPLANT
INSERT SMALL SOCKET 32MM NEU (Insert) ×3 IMPLANT
KIT BASIN OR (CUSTOM PROCEDURE TRAY) ×3 IMPLANT
KIT TURNOVER KIT A (KITS) IMPLANT
MANIFOLD NEPTUNE II (INSTRUMENTS) ×3 IMPLANT
NEEDLE TROCAR POINT SZ 2 1/2 (NEEDLE) IMPLANT
NS IRRIG 1000ML POUR BTL (IV SOLUTION) ×3 IMPLANT
P2 COATDE GLNOID BSEPLT 6.5X30 (Shoulder) ×3 IMPLANT
PACK SHOULDER (CUSTOM PROCEDURE TRAY) ×3 IMPLANT
PAD COLD SHLDR WRAP-ON (PAD) IMPLANT
PROTECTOR NERVE ULNAR (MISCELLANEOUS) IMPLANT
RESTRAINT HEAD UNIVERSAL NS (MISCELLANEOUS) IMPLANT
RETRIEVER SUT HEWSON (MISCELLANEOUS) IMPLANT
SCREW BONE LOCKING RSP 5.0X14 (Screw) ×3 IMPLANT
SCREW BONE LOCKING RSP 5.0X30 (Screw) ×3 IMPLANT
SCREW BONE RSP LOCK 5X14 (Screw) ×1 IMPLANT
SCREW BONE RSP LOCK 5X26 (Screw) ×2 IMPLANT
SCREW BONE RSP LOCK 5X30 (Screw) ×1 IMPLANT
SCREW BONE RSP LOCKING 5.0X26 (Screw) ×6 IMPLANT
SCREW RETAIN W/HEAD 4MM OFFSET (Shoulder) ×3 IMPLANT
SET HNDPC FAN SPRY TIP SCT (DISPOSABLE) ×1 IMPLANT
SLING ARM IMMOBILIZER LRG (SOFTGOODS) IMPLANT
SLING ARM IMMOBILIZER MED (SOFTGOODS) IMPLANT
SPONGE LAP 18X18 RF (DISPOSABLE) ×3 IMPLANT
STEM HUMERAL SM SHELL SHOU 10 (Miscellaneous) ×1 IMPLANT
STRIP CLOSURE SKIN 1/2X4 (GAUZE/BANDAGES/DRESSINGS) ×2 IMPLANT
SUCTION FRAZIER HANDLE 10FR (MISCELLANEOUS)
SUCTION TUBE FRAZIER 10FR DISP (MISCELLANEOUS) IMPLANT
SUPPORT WRAP ARM LG (MISCELLANEOUS) IMPLANT
SUT ETHIBOND 2 OS 4 DA (SUTURE) IMPLANT
SUT FIBERWIRE #2 38 REV NDL BL (SUTURE)
SUT MNCRL AB 4-0 PS2 18 (SUTURE) ×3 IMPLANT
SUT VIC AB 2-0 CT1 27 (SUTURE) ×3
SUT VIC AB 2-0 CT1 TAPERPNT 27 (SUTURE) ×1 IMPLANT
SUTURE FIBERWR#2 38 REV NDL BL (SUTURE) IMPLANT
TOWEL OR 17X26 10 PK STRL BLUE (TOWEL DISPOSABLE) ×3 IMPLANT
TOWEL OR NON WOVEN STRL DISP B (DISPOSABLE) ×3 IMPLANT
WATER STERILE IRR 1000ML POUR (IV SOLUTION) ×3 IMPLANT
YANKAUER SUCT BULB TIP 10FT TU (MISCELLANEOUS) ×3 IMPLANT

## 2019-08-08 NOTE — Op Note (Signed)
Procedure(s): REVERSE SHOULDER ARTHROPLASTY Procedure Note  Theresa Mejia female 79 y.o. 08/08/2019   Preoperative diagnosis: Right shoulder end-stage arthritis with advanced rotator cuff disease  Postoperative diagnosis: Same  Procedure(s) and Anesthesia Type: Right REVERSE SHOULDER ARTHROPLASTY - General   Indications:  80 y.o. female  With endstage right shoulder arthritis with advanced rotator cuff disease. Pain and dysfunction interfered with quality of life and nonoperative treatment with activity modification, NSAIDS and injections failed.     Surgeon: [name]   Assistants: Jeanmarie Hubert PA-C [facility] was present and scrubbed throughout the procedure and was essential in positioning, retraction, exposure, and closure)  Anesthesia: General endotracheal anesthesia with preoperative interscalene block given by the attending anesthesiologist     Procedure Detail  REVERSE SHOULDER ARTHROPLASTY   Estimated Blood Loss:  200 mL         Drains: none  Blood Given: none          Specimens: none        Complications:  * No complications entered in OR log *         Disposition: PACU - hemodynamically stable.         Condition: stable      OPERATIVE FINDINGS:  A DJO Altivate pressfit reverse total shoulder arthroplasty was placed with a  size 10 stem, a 32-4 glenosphere, and a standard 32-mm poly insert. The base plate  fixation was excellent.  PROCEDURE: The patient was identified in the preoperative holding area  where I personally marked the operative site after verifying site, side,  and procedure with the patient. An interscalene block given by  the attending anesthesiologist in the holding area and the patient was taken back to the operating room where all extremities were  carefully padded in position after general anesthesia was induced. She  was placed in a beach-chair position and the operative upper extremity was  prepped and draped in a  standard sterile fashion. An approximately 10-  cm incision was made from the tip of the coracoid process to the center  point of the humerus at the level of the axilla. Dissection was carried  down through subcutaneous tissues to the level of the cephalic vein  which was taken laterally with the deltoid. The pectoralis major was  retracted medially. The subdeltoid space was developed and the lateral  edge of the conjoined tendon was identified. The undersurface of  conjoined tendon was palpated and the musculocutaneous nerve was not in  the field. Retractor was placed underneath the conjoined and second  retractor was placed lateral into the deltoid. The circumflex humeral  artery and vessels were identified and clamped and coagulated. The  biceps tendon was tenotomized.  The subscapularis was taken down as a peel.  The  joint was then gently externally rotated while the capsule was released  from the humeral neck around to just beyond the 6 o'clock position. At  this point, the joint was dislocated and the humeral head was presented  into the wound.  The anterior supraspinatus was noted to be very thin and was released for about 1 cm anteriorly.  The excessive osteophyte formation was removed with a  large rongeur.  The cutting guide was used to make the appropriate  head cut and the head was saved for potentially bone grafting.  The glenoid was exposed with the arm in an  abducted extended position. The anterior and posterior labrum were  completely excised and the capsule was released circumferentially to  allow for exposure of the glenoid for preparation. The 2.5 mm drill was  placed using the guide in 5-10 inferior angulation and the tap was then advanced in the same hole. Small and large reamers were then used. The tap was then removed and the Metaglene was then screwed in with excellent purchase.  The peripheral guide was then used to drilled measured and filled peripheral locking  screws. The size 32-4 glenosphere was then impacted on the [facility] taper and the central screw was placed. The humerus was then again exposed and the diaphyseal reamers were used followed by the metaphyseal reamers. The final broach was left in place in the proximal trial was placed. The joint was reduced and with this implant it was felt that soft tissue tensioning was appropriate with excellent stability and excellent range of motion. Therefore, final humeral stem was placed press-fit with bone grafting.  And then the trial polyethylene inserts were tested again and the above implant was felt to be the most appropriate for final insertion. The joint was reduced taken through full range of motion and felt to be stable. Soft tissue tension was appropriate.  The joint was then copiously irrigated with pulse  lavage and the wound was then closed. The subscapularis was repaired with sutures through bone tunnel.  Skin was closed with 2-0 Vicryl in a deep dermal layer and 4-0  Monocryl for skin closure. Steri-Strips were applied. Sterile  dressings were then applied as well as a sling. The patient was allowed  to awaken from general anesthesia, transferred to stretcher, and taken  to recovery room in stable condition.   POSTOPERATIVE PLAN: The patient will be observed in the recovery room and if she has a good nerve block and is hemodynamically stable could be discharged home with family today.

## 2019-08-08 NOTE — Transfer of Care (Signed)
Immediate Anesthesia Transfer of Care Note  Patient: TRYSTAN EADS  Procedure(s) Performed: REVERSE SHOULDER ARTHROPLASTY (Right Shoulder)  Patient Location: PACU  Anesthesia Type:General and Regional  Level of Consciousness: drowsy and patient cooperative  Airway & Oxygen Therapy: Patient Spontanous Breathing and Patient connected to face mask oxygen  Post-op Assessment: Report given to RN and Post -op Vital signs reviewed and stable  Post vital signs: Reviewed and stable  Last Vitals:  Vitals Value Taken Time  BP 151/106 08/08/19 1122  Temp    Pulse 90 08/08/19 1125  Resp 16 08/08/19 1125  SpO2 99 % 08/08/19 1125  Vitals shown include unvalidated device data.  Last Pain:  Vitals:   08/08/19 1122  TempSrc:   PainSc: (P) 0-No pain      Patients Stated Pain Goal: 2 (73/57/89 7847)  Complications: No complications documented.

## 2019-08-08 NOTE — Progress Notes (Signed)
Assisted Dr. Gifford Shave with right, ultrasound guided, interscalene  block. Side rails up, monitors on throughout procedure. See vital signs in flow sheet. Tolerated Procedure well. exparel given also. Bracelet placed on patient, exparel given label placed in chart.

## 2019-08-08 NOTE — Anesthesia Postprocedure Evaluation (Signed)
Anesthesia Post Note  Patient: Theresa Mejia  Procedure(s) Performed: REVERSE SHOULDER ARTHROPLASTY (Right Shoulder)     Patient location during evaluation: PACU Anesthesia Type: General Level of consciousness: awake and alert, awake and oriented Pain management: pain level controlled Vital Signs Assessment: post-procedure vital signs reviewed and stable Respiratory status: spontaneous breathing, nonlabored ventilation and respiratory function stable Cardiovascular status: blood pressure returned to baseline and stable Postop Assessment: no apparent nausea or vomiting Anesthetic complications: no   No complications documented.  Last Vitals:  Vitals:   08/08/19 1230 08/08/19 1243  BP: (!) 131/84 121/75  Pulse: 73 81  Resp: 12 14  Temp: (!) 36 C 36.4 C  SpO2: 94% 97%    Last Pain:  Vitals:   08/08/19 1243  TempSrc: Oral  PainSc: 0-No pain                 Catalina Gravel

## 2019-08-08 NOTE — H&P (Signed)
Theresa Mejia is an 79 y.o. female.   Chief Complaint: R shoulder pain and dysfunction HPI: Endstage R shoulder arthritis with significant pain and dysfunction, failed conservative measures.  Pain interferes with sleep and quality of life.   Past Medical History:  Diagnosis Date  . Allergic rhinitis   . Depression   . DJD (degenerative joint disease)   . GERD (gastroesophageal reflux disease)   . Headache(784.0)    sinus  . Hypercholesteremia   . Hypersomnia   . Hypertension   . Hypothyroidism   . Internal hemorrhoid 11/2008   s/p banding ([place])  . Lumbar disc disease   . OBSTRUCTIVE SLEEP APNEA    NPSG 04/29/07- AHI 1.1/hr, RDI 21.2/hr. Weight 176 lbs. CPAP was tried based on the RDI.    [name] OVERACTIVE BLADDER    pt unaware  . Presence of permanent cardiac pacemaker 11/11/2015  . Reactive depression (situational)   . Sinus node dysfunction (HCC)   . Spondylosis, cervical     Past Surgical History:  Procedure Laterality Date  . BIOPSY BREAST    . BREAST EXCISIONAL BIOPSY Left 2011  . CATARACT EXTRACTION W/ INTRAOCULAR LENS IMPLANT Right   . COLONOSCOPY    . EP IMPLANTABLE DEVICE N/A 11/11/2015   Procedure: Pacemaker Implant;  Surgeon: Evans Lance, MD;  Location: [facility];  Service: Cardiovascular;  Laterality: N/A;  . eye lid lift    . INSERT / REPLACE / REMOVE PACEMAKER  11/11/2015  . KNEE ARTHROSCOPY     Dr. [name]  . MOUTH SURGERY  08/25/2017   implant fell out due to bone loss  . rotator cuff surgery Right    Dr. [name]  . TUBAL LIGATION      Family History  Problem Relation Age of Onset  . Crohn's disease Father 29  . Macular degeneration Mother   . Cancer Mother 78       bladder, never confirmed with biospy  . Hypothyroidism Mother   . Tremor Mother   . Stroke Mother 81  . Parkinsonism Maternal Grandmother   . Alzheimer's disease Paternal Aunt   . Alzheimer's disease Paternal Uncle   . Diabetes Paternal Grandmother   . Colon cancer Neg Hx    . Esophageal cancer Neg Hx   . Rectal cancer Neg Hx    Social History:  reports that she quit smoking about 53 years ago. Her smoking use included cigarettes. She quit after 8.00 years of use. She has never used smokeless tobacco. She reports that she does not drink alcohol and does not use drugs.  Allergies:  Allergies  Allergen Reactions  . Ramipril     Unknown reaction  . Sulfa Antibiotics     Says it dropped her blood pressure really low.   . Sulfonamide Derivatives     REACTION: hypotension    Medications Prior to Admission  Medication Sig Dispense Refill  . amLODipine (NORVASC) 5 MG tablet TAKE 1 TABLET BY MOUTH EVERY DAY (Patient taking differently: Take 5 mg by mouth at bedtime. ) 90 tablet 1  . buPROPion (WELLBUTRIN SR) 150 MG 12 hr tablet TAKE 1 TABLET BY MOUTH TWICE A DAY (Patient taking differently: Take 150 mg by mouth 2 (two) times daily. ) 180 tablet 1  . calcium carbonate (TUMS - DOSED IN MG ELEMENTAL CALCIUM) 500 MG chewable tablet Chew 1 tablet by mouth 4 (four) times daily as needed for indigestion or heartburn.    . Cholecalciferol (VITAMIN D3) 50 MCG (  2000 UT) TABS Take 2,000 Units by mouth every other day.     . clonazePAM (KLONOPIN) 0.5 MG tablet Take 1/2 at bedtime as needed for insomnia, may increase to a whole tablet if needed (Patient taking differently: Take 0.25-0.5 mg by mouth at bedtime as needed (sleep). ) 30 tablet 0  . Cyanocobalamin (B-12) 5000 MCG CAPS Take 5,000 mcg by mouth every other day.    . famotidine (PEPCID) 10 MG tablet Take 10 mg by mouth daily as needed for heartburn or indigestion.    . fluticasone (FLONASE) 50 MCG/ACT nasal spray SPRAY 2 SPRAYS INTO EACH NOSTRIL EVERY DAY (Patient taking differently: Place 2 sprays into both nostrils daily. ) 48 mL 1  . ibuprofen (ADVIL) 200 MG tablet Take 400 mg by mouth every 6 (six) hours as needed for headache or moderate pain.    [name] ipratropium (ATROVENT) 0.03 % nasal spray PLACE 2 SPRAYS INTO THE  NOSE 4 (FOUR) TIMES DAILY. USE AS NEEDED FOR POST NASAL DRIP (Patient taking differently: Place 2 sprays into both nostrils daily. ) 30 mL 6  . levocetirizine (XYZAL) 5 MG tablet TAKE 1 TABLET BY MOUTH EVERY DAY IN THE EVENING (Patient taking differently: Take 5 mg by mouth daily. ) 90 tablet 3  . levothyroxine (SYNTHROID) 125 MCG tablet Take 1 tablet (125 mcg total) by mouth daily. (Patient taking differently: Take 62.5 mcg by mouth daily. ) 90 tablet 1  . Polyvinyl Alcohol-Povidone (REFRESH OP) Place 1 drop into both eyes daily as needed (dry eyes).    . simvastatin (ZOCOR) 20 MG tablet Take 1 tablet (20 mg total) by mouth at bedtime. 90 tablet 3  . telmisartan (MICARDIS) 80 MG tablet TAKE 1 TABLET BY MOUTH EVERY DAY (Patient taking differently: Take 80 mg by mouth daily. ) 90 tablet 3    Results for orders placed or performed during the hospital encounter of 08/08/19 (from the past 48 hour(s))  ABO/Rh     Status: None   Collection Time: 08/08/19  8:20 AM  Result Value Ref Range   ABO/RH(D)      A POS Performed at [facility], [place] [address], [place], [place] [address]    No results found.  Review of Systems  All other systems reviewed and are negative.   Blood pressure 124/83, pulse 91, temperature 98.2 F (36.8 C), temperature source Oral, resp. rate 13, height 5' (1.524 m), weight 68.5 kg, SpO2 99 %. Physical Exam HENT:     Head: Atraumatic.  Eyes:     Extraocular Movements: Extraocular movements intact.  Cardiovascular:     Pulses: Normal pulses.  Pulmonary:     Effort: Pulmonary effort is normal.  Musculoskeletal:     Comments: R shoulder pain with limited ROM.  Skin:    General: Skin is warm and dry.  Neurological:     Mental Status: She is alert.  Psychiatric:        Mood and Affect: Mood normal.      Assessment/Plan R shoulder endstage arthritis with significant rotator cuff disease. Plan R reverse TSA. Risks / benefits of surgery  discussed Consent on chart  NPO for OR Preop antibiotics   [name], MD 08/08/2019, 9:10 AM

## 2019-08-08 NOTE — Evaluation (Signed)
Occupational Therapy Evaluation Patient Details Name: Theresa Mejia MRN: [id number] DOB: [birth year] Today's Date: 08/08/2019    History of Present Illness Pt s/p Right REVERSE SHOULDER ARTHROPLASTY on 08/08/19 under Dr [name]   Clinical Impression   Patient is a 79 year old RT handed female, s/p RT reverse TSA surgery resulting in functional limitations due to the deficits listed below (see OT Problem List).  Patient completed skilled OT evaluation and treatment session including education on UE dressing, toileting, bathing, grooming, use of cryo-cuff and correct positioning and wear of sling this session with husband present and demonstrating assistance as needed with pt independently directing pt per ADL instructions.   Therapist provided education and instruction to patient and spouse in regards to exercises, precautions, positioning, donning upper extremity clothing and bathing while maintaining shoulder precautions, ice and edema management and donning/doffing sling. Patient and spouse verbalized understanding and demonstrated as needed. Patient to follow up with MD for further therapy needs.     Follow Up Recommendations  Follow surgeon's recommendation for DC plan and follow-up therapies;Supervision - Intermittent    Equipment Recommendations  None recommended by OT    Recommendations for Other Services       Precautions / Restrictions Precautions Precautions: Shoulder Type of Shoulder Precautions: No shoulder movement, sling at all times except dressing, showering, bathing. Shoulder Interventions: Shoulder sling/immobilizer;At all times;Off for dressing/bathing/exercises Precaution Booklet Issued: Yes (comment) Restrictions Weight Bearing Restrictions: Yes RUE Weight Bearing: Non weight bearing      Mobility Bed Mobility  NA                Transfers Overall transfer level: Modified independent Equipment used: None             General transfer  comment: Sit<>Stand Mod I (increased time)    Balance Overall balance assessment: Independent                                         ADL either performed or assessed with clinical judgement   ADL Overall ADL's : Needs assistance/impaired       Grooming Details (indicate cue type and reason): Pt/husband instructed on dangle method to apply deoderant.   Upper Body Bathing Details (indicate cue type and reason): Pt/husband instructed on dangle method to wash and dry under RT arm.     Upper Body Dressing : Moderate assistance Upper Body Dressing Details (indicate cue type and reason): Pt/husband educated on compensatory strategies for donning overhead and button down shirt. Pt donned button down shirt with Mod Assist, dressing surgical arm 1st. Educated on how to undress correctly.  Pt/husband educated on correct donning and positioning of sling, and sling weating orders. Lower Body Dressing: Moderate assistance Lower Body Dressing Details (indicate cue type and reason): Pt donned underwear/pants and shoes with Mod As from husband.           Tub/Shower Transfer Details (indicate cue type and reason): Instructed pt to have husband present for 1st shower for safety. Functional mobility during ADLs: Modified independent General ADL Comments: Pt able to stand to pull underwear and pants over hips Mod I.     Vision Baseline Vision/History: No visual deficits Patient Visual Report: No change from baseline Vision Assessment?: No apparent visual deficits     Perception     Praxis      Pertinent Vitals/Pain Pain Assessment: No/denies pain (  Numb)     Hand Dominance Right   Extremity/Trunk Assessment Upper Extremity Assessment Upper Extremity Assessment:  (Impaired ROM/strength due to affects of nerve block/numbness. Able to wiggle thumb.)       Cervical / Trunk Assessment Cervical / Trunk Assessment: Normal   Communication Communication Communication: No  difficulties   Cognition Arousal/Alertness: Awake/alert Behavior During Therapy: WFL for tasks assessed/performed Overall Cognitive Status: Within Functional Limits for tasks assessed                                     General Comments       Exercises     Shoulder Instructions Shoulder Instructions Donning/doffing shirt without moving shoulder: Moderate assistance;Caregiver independent with task;Patient able to independently direct caregiver Method for sponge bathing under operated UE: Moderate assistance;Caregiver independent with task;Patient able to independently direct caregiver Donning/doffing sling/immobilizer: Maximal assistance;Caregiver independent with task;Patient able to independently direct caregiver Correct positioning of sling/immobilizer: Independent;Caregiver independent with task;Patient able to independently direct caregiver ROM for elbow, wrist and digits of operated UE: Maximal assistance;Patient able to independently direct caregiver;Caregiver independent with task Sling wearing schedule (on at all times/off for ADL's): Independent;Caregiver independent with task;Patient able to independently direct caregiver Proper positioning of operated UE when showering: Caregiver independent with task;Patient able to independently direct caregiver;Independent Positioning of UE while sleeping: Independent;Caregiver independent with task;Patient able to independently direct caregiver    Home Living Family/patient expects to be discharged to:: Private residence Living Arrangements: Spouse/significant other Available Help at Discharge: Family                         Home Equipment: Adaptive equipment Adaptive Equipment: Reacher;Sock aid;Long-handled shoe horn        Prior Functioning/Environment Level of Independence: Independent                 OT Problem List: Decreased range of motion;Decreased strength;Decreased coordination;Impaired  sensation      OT Treatment/Interventions:      OT Goals(Current goals can be found in the care plan section) Acute Rehab OT Goals Patient Stated Goal: To go home. OT Goal Formulation: All assessment and education complete, DC therapy Time For Goal Achievement: 08/08/19 (goals met)  OT Frequency:     Barriers to D/C:  None          Co-evaluation              AM-PAC OT "6 Clicks" Daily Activity     Outcome Measure Help from another person eating meals?: A Little Help from another person taking care of personal grooming?: A Little Help from another person toileting, which includes using toliet, bedpan, or urinal?: A Little Help from another person bathing (including washing, rinsing, drying)?: A Little Help from another person to put on and taking off regular upper body clothing?: A Lot Help from another person to put on and taking off regular lower body clothing?: A Lot 6 Click Score: 16   End of Session Equipment Utilized During Treatment:  (sling) Nurse Communication:  (OT session completed)  Activity Tolerance: Patient tolerated treatment well;No increased pain Patient left: in chair;with family/visitor present  OT Visit Diagnosis: Muscle weakness (generalized) (M62.81)                Time: 3710-6269 OT Time Calculation (min): 42 min Charges:  OT General Charges $OT Visit: 1 Visit OT Evaluation $OT  Eval Low Complexity: 1 Low OT Treatments $Self Care/Home Management : 23-37 mins  Anderson Malta, OT Acute Rehab Services Office: [phone] 08/08/2019   Julien Girt 08/08/2019, 2:55 PM

## 2019-08-08 NOTE — Anesthesia Procedure Notes (Signed)
Anesthesia Regional Block: Interscalene brachial plexus block   Pre-Anesthetic Checklist: ,, timeout performed, Correct Patient, Correct Site, Correct Laterality, Correct Procedure, Correct Position, site marked, Risks and benefits discussed,  Surgical consent,  Pre-op evaluation,  At surgeon's request and post-op pain management  Laterality: Right  Prep: chloraprep       Needles:  Injection technique: Single-shot  Needle Type: Echogenic Stimulator Needle     Needle Length: 5cm  Needle Gauge: 22     Additional Needles:   Procedures:,,,, ultrasound used (permanent image in chart),,,,  Narrative:  Start time: 08/08/2019 8:35 AM End time: 08/08/2019 8:45 AM Injection made incrementally with aspirations every 5 mL.  Performed by: Personally  Anesthesiologist: Catalina Gravel, MD  Additional Notes: Functioning IV was confirmed and monitors were applied.  A 88mm 22ga Arrow echogenic stimulator needle was used. Sterile prep and drape, hand hygiene, and sterile gloves were used.  Negative aspiration and negative test dose prior to incremental administration of local anesthetic. The patient tolerated the procedure well.  Ultrasound guidance: relevent anatomy identified, needle position confirmed, local anesthetic spread visualized around nerve(s), vascular puncture avoided.  Image printed for medical record.

## 2019-08-08 NOTE — Discharge Instructions (Signed)

## 2019-08-08 NOTE — Anesthesia Procedure Notes (Signed)
Procedure Name: Intubation Date/Time: 08/08/2019 9:56 AM Performed by: Gwyndolyn Saxon, CRNA Pre-anesthesia Checklist: Patient identified, Emergency Drugs available, Suction available and Patient being monitored Patient Re-evaluated:Patient Re-evaluated prior to induction Oxygen Delivery Method: Circle System Utilized Preoxygenation: Pre-oxygenation with 100% oxygen Induction Type: IV induction Ventilation: Two handed mask ventilation required Grade View: Grade II Tube type: Oral Tube size: 7.0 mm Number of attempts: 1 Airway Equipment and Method: Stylet and Oral airway Placement Confirmation: ETT inserted through vocal cords under direct vision,  positive ETCO2 and breath sounds checked- equal and bilateral Secured at: 23 cm Tube secured with: Tape Dental Injury: Teeth and Oropharynx as per pre-operative assessment

## 2019-08-09 ENCOUNTER — Encounter (HOSPITAL_COMMUNITY): Payer: Self-pay | Admitting: Orthopedic Surgery

## 2019-08-15 ENCOUNTER — Telehealth: Payer: Self-pay | Admitting: Pharmacist

## 2019-08-15 NOTE — Progress Notes (Signed)
Chronic Care Management Pharmacy Assistant   Name: Theresa Mejia  MRN: [id number] DOB: 08/24/1940  Reason for Encounter: Disease State  PCP : Darreld Mclean, MD   The patient's chronic conditions include: HTN, HLD, Depression and anxiety, Allergic Rhinitis, Hypothyroidism, GERD, Vitamin B12 Deficiency, Vitamin D Deficiency, Osteopenia   Allergies:   Allergies  Allergen Reactions  . Ramipril     Unknown reaction  . Sulfa Antibiotics     Says it dropped her blood pressure really low.   . Sulfonamide Derivatives     REACTION: hypotension    Medications: Outpatient Encounter Medications as of 08/15/2019  Medication Sig  . amLODipine (NORVASC) 5 MG tablet TAKE 1 TABLET BY MOUTH EVERY DAY (Patient taking differently: Take 5 mg by mouth at bedtime. )  . buPROPion (WELLBUTRIN SR) 150 MG 12 hr tablet TAKE 1 TABLET BY MOUTH TWICE A DAY (Patient taking differently: Take 150 mg by mouth 2 (two) times daily. )  . calcium carbonate (TUMS - DOSED IN MG ELEMENTAL CALCIUM) 500 MG chewable tablet Chew 1 tablet by mouth 4 (four) times daily as needed for indigestion or heartburn.  . Cholecalciferol (VITAMIN D3) 50 MCG (2000 UT) TABS Take 2,000 Units by mouth every other day.   . clonazePAM (KLONOPIN) 0.5 MG tablet Take 1/2 at bedtime as needed for insomnia, may increase to a whole tablet if needed (Patient taking differently: Take 0.25-0.5 mg by mouth at bedtime as needed (sleep). )  . Cyanocobalamin (B-12) 5000 MCG CAPS Take 5,000 mcg by mouth every other day.  . famotidine (PEPCID) 10 MG tablet Take 10 mg by mouth daily as needed for heartburn or indigestion.  . fluticasone (FLONASE) 50 MCG/ACT nasal spray SPRAY 2 SPRAYS INTO EACH NOSTRIL EVERY DAY (Patient taking differently: Place 2 sprays into both nostrils daily. )  . HYDROcodone-acetaminophen (NORCO/VICODIN) 5-325 MG tablet Take 1-2 tablets by mouth every 4 (four) hours as needed for moderate pain.  [name] ipratropium (ATROVENT) 0.03 %  nasal spray PLACE 2 SPRAYS INTO THE NOSE 4 (FOUR) TIMES DAILY. USE AS NEEDED FOR POST NASAL DRIP (Patient taking differently: Place 2 sprays into both nostrils daily. )  . levocetirizine (XYZAL) 5 MG tablet TAKE 1 TABLET BY MOUTH EVERY DAY IN THE EVENING (Patient taking differently: Take 5 mg by mouth daily. )  . levothyroxine (SYNTHROID) 125 MCG tablet Take 1 tablet (125 mcg total) by mouth daily. (Patient taking differently: Take 62.5 mcg by mouth daily. )  . Polyvinyl Alcohol-Povidone (REFRESH OP) Place 1 drop into both eyes daily as needed (dry eyes).  . simvastatin (ZOCOR) 20 MG tablet Take 1 tablet (20 mg total) by mouth at bedtime.  [name] telmisartan (MICARDIS) 80 MG tablet TAKE 1 TABLET BY MOUTH EVERY DAY (Patient taking differently: Take 80 mg by mouth daily. )  . tiZANidine (ZANAFLEX) 4 MG tablet Take 1 tablet (4 mg total) by mouth every 8 (eight) hours as needed for muscle spasms.   No facility-administered encounter medications on file as of 08/15/2019.    Current Diagnosis: Patient Active Problem List   Diagnosis Date Noted  . Osteopenia 01/09/2019  . Pansinusitis 03/06/2018  . Cough 03/06/2018  . Orthostatic hypotension 12/06/2016  . Viral URI with cough 04/20/2016  . Sinus node dysfunction ([place]) 11/11/2015  . Routine general medical examination at a health care facility 06/04/2015  . Insomnia with sleep apnea 06/04/2015  . Bronchitis, chronic obstructive w acute bronchitis ([place]) 12/10/2014  . Lumbar disc disease 09/01/2010  .  Obstructive sleep apnea 05/31/2007  . Hyperlipidemia with target LDL less than 130 02/05/2007  . Depression with anxiety 02/05/2007  . Allergic rhinitis 02/05/2007  . Hypothyroidism 02/02/2007  . Essential hypertension 02/02/2007    Goals Addressed   None     Follow-Up:  Scheduled Follow-Up With Clinical Pharmacist   Called patient to remind her of CCM appointment with CPP on Thursday August 12th at 11am. Left a message on the patient's  voicemail.  Fanny Skates, [place] Pharmacist Assistant [phone]

## 2019-08-21 ENCOUNTER — Ambulatory Visit (INDEPENDENT_AMBULATORY_CARE_PROVIDER_SITE_OTHER): Payer: PPO | Admitting: *Deleted

## 2019-08-21 DIAGNOSIS — Z950 Presence of cardiac pacemaker: Secondary | ICD-10-CM | POA: Diagnosis not present

## 2019-08-21 DIAGNOSIS — Z471 Aftercare following joint replacement surgery: Secondary | ICD-10-CM | POA: Diagnosis not present

## 2019-08-21 DIAGNOSIS — Z96611 Presence of right artificial shoulder joint: Secondary | ICD-10-CM | POA: Diagnosis not present

## 2019-08-21 LAB — CUP PACEART REMOTE DEVICE CHECK
Date Time Interrogation Session: 20210811093231
Implantable Lead Implant Date: 20171101
Implantable Lead Implant Date: 20171101
Implantable Lead Location: 753859
Implantable Lead Location: 753860
Implantable Lead Model: 377
Implantable Lead Model: 377
Implantable Lead Serial Number: 49553678
Implantable Lead Serial Number: 49617393
Implantable Pulse Generator Implant Date: 20171101
Pulse Gen Model: 394969
Pulse Gen Serial Number: 68817609

## 2019-08-22 ENCOUNTER — Other Ambulatory Visit: Payer: Self-pay

## 2019-08-22 ENCOUNTER — Ambulatory Visit: Payer: PPO | Admitting: Pharmacist

## 2019-08-22 DIAGNOSIS — I10 Essential (primary) hypertension: Secondary | ICD-10-CM

## 2019-08-22 DIAGNOSIS — E785 Hyperlipidemia, unspecified: Secondary | ICD-10-CM

## 2019-08-22 DIAGNOSIS — M85829 Other specified disorders of bone density and structure, unspecified upper arm: Secondary | ICD-10-CM

## 2019-08-22 NOTE — Patient Instructions (Signed)
Visit Information  Goals Addressed            This Visit's Progress    Chronic Care Management Pharmacy Care Plan       CARE PLAN ENTRY (see longitudinal plan of care for additional care plan information)  Current Barriers:   Chronic Disease Management support, education, and care coordination needs related to HTN, HLD, Depression and anxiety, Allergic Rhinitis, Hypothyroidism, GERD, Vitamin B12 Deficiency, Vitamin D Deficiency, Osteopenia   Hypertension BP Readings from Last 3 Encounters:  08/08/19 98/70  08/02/19 96/68  10/22/18 118/70    Pharmacist Clinical Goal(s): o Over the next 90 days, patient will work with PharmD and providers to maintain BP goal <140/90  Current regimen:   Amlodipine 5mg  daily  Telmisartan 80mg  daily  Interventions: o Requested patient to check blood pressure 2-3 times per week and record o Also check BP when feeling lightheaded  Patient self care activities - Over the next 90 days, patient will: o Check BP 2-3 times per week, document, and provide at future appointments o Ensure daily salt intake < 2300 mg/Verlon Carcione  Hyperlipidemia Lab Results  Component Value Date/Time   LDLCALC 124 (H) 10/22/2018 03:16 PM   LDLDIRECT 148.3 09/01/2010 10:41 AM    Pharmacist Clinical Goal(s): o Over the next 90 days, patient will work with PharmD and providers to achieve LDL goal < 100  Current regimen:  o Simvastatin 20mg  daily  Interventions: o Discussed LDL goals and possible need for statin titration if LDL >100  Patient self care activities - Over the next 90 days, patient will: o Maintain cholesterol medication regimen.   Osteopenia  Pharmacist Clinical Goal(s) o Over the next 90 days, patient will work with PharmD and providers to reduce risk of fracture due to osteopenia  Current regimen:   Vitamin D 2000 units every other Elgie Landino  Tums 500mg  as needed (uses multiple daily)  Interventions: o Discussed option of Prolia to replace  fosamax  Patient self care activities - Over the next 90 days, patient will: o Schedule follow up with Dr. Lorelei Pont and discuss Prolia  Medication management  Pharmacist Clinical Goal(s): o Over the next 90 days, patient will work with PharmD and providers to maintain optimal medication adherence  Current pharmacy: [facility]  Interventions o Comprehensive medication review performed. o Continue current medication management strategy  Patient self care activities - Over the next 90 days, patient will: o Focus on medication adherence by filling and taking medications appropriately  o Take medications as prescribed o Report any questions or concerns to PharmD and/or provider(s)  Please see past updates related to this goal by clicking on the "Past Updates" button in the selected goal         The patient verbalized understanding of instructions provided today and agreed to receive a mailed copy of patient instruction and/or educational materials.  Telephone follow up appointment with pharmacy team member scheduled for: 02/24/2020  [place], PharmD Clinical Pharmacist [facility] [phone]

## 2019-08-22 NOTE — Chronic Care Management (AMB) (Signed)
Chronic Care Management Pharmacy  Name: BRYTANI VOTH  MRN: [id number] DOB: [birth year]   Chief Complaint/ HPI  Theresa Mejia,  79 y.o. , female presents for their Follow-Up CCM visit with the clinical pharmacist via telephone.  PCP : Darreld Mclean, MD  Their chronic conditions include: HTN, HLD, Depression and anxiety, Allergic Rhinitis, Hypothyroidism, GERD, Vitamin B12 Deficiency, Vitamin D Deficiency, Osteopenia  Office Visits: 06/15/19: Patient message regarding GERD due to fosamax. Fosamax D/C and Prolia offered by Dr. Lorelei Pont.    Consult Visits: 08/08/19: Reverse Shoulder Arthroplasty   08/02/19: Cardio visit w/ Tommye Standard, PA-C - Pre-operative clearance   06/14/19: Ortho visit w/ Dr. Tamera Punt   05/29/19: Derm visit w/ Dr. [name]  05/22/19: Cardio visit w/ Dr. Lovena Le  Medications: Outpatient Encounter Medications as of 08/22/2019  Medication Sig  . amLODipine (NORVASC) 5 MG tablet TAKE 1 TABLET BY MOUTH EVERY Margaux Engen (Patient taking differently: Take 5 mg by mouth at bedtime. )  . buPROPion (WELLBUTRIN SR) 150 MG 12 hr tablet TAKE 1 TABLET BY MOUTH TWICE A Joshaua Epple (Patient taking differently: Take 150 mg by mouth 2 (two) times daily. )  . calcium carbonate (TUMS - DOSED IN MG ELEMENTAL CALCIUM) 500 MG chewable tablet Chew 1 tablet by mouth 4 (four) times daily as needed for indigestion or heartburn.  . Cholecalciferol (VITAMIN D3) 50 MCG (2000 UT) TABS Take 2,000 Units by mouth every other Arvle Grabe.   . clonazePAM (KLONOPIN) 0.5 MG tablet Take 1/2 at bedtime as needed for insomnia, may increase to a whole tablet if needed (Patient taking differently: Take 0.25-0.5 mg by mouth at bedtime as needed (sleep). )  . Cyanocobalamin (B-12) 5000 MCG CAPS Take 5,000 mcg by mouth every other Summer Mccolgan.  . famotidine (PEPCID) 10 MG tablet Take 10 mg by mouth daily as needed for heartburn or indigestion.  . fluticasone (FLONASE) 50 MCG/ACT nasal spray SPRAY 2 SPRAYS INTO EACH NOSTRIL EVERY Geremiah Fussell (Patient  taking differently: Place 2 sprays into both nostrils daily. )  . HYDROcodone-acetaminophen (NORCO/VICODIN) 5-325 MG tablet Take 1-2 tablets by mouth every 4 (four) hours as needed for moderate pain.  [name] ipratropium (ATROVENT) 0.03 % nasal spray PLACE 2 SPRAYS INTO THE NOSE 4 (FOUR) TIMES DAILY. USE AS NEEDED FOR POST NASAL DRIP (Patient taking differently: Place 2 sprays into both nostrils daily. )  . levocetirizine (XYZAL) 5 MG tablet TAKE 1 TABLET BY MOUTH EVERY Keajah Killough IN THE EVENING (Patient taking differently: Take 5 mg by mouth daily. )  . levothyroxine (SYNTHROID) 125 MCG tablet Take 1 tablet (125 mcg total) by mouth daily. (Patient taking differently: Take 62.5 mcg by mouth daily. )  . Polyvinyl Alcohol-Povidone (REFRESH OP) Place 1 drop into both eyes daily as needed (dry eyes).  . simvastatin (ZOCOR) 20 MG tablet Take 1 tablet (20 mg total) by mouth at bedtime.  [name] telmisartan (MICARDIS) 80 MG tablet TAKE 1 TABLET BY MOUTH EVERY Zanya Lindo (Patient taking differently: Take 80 mg by mouth daily. )  . tiZANidine (ZANAFLEX) 4 MG tablet Take 1 tablet (4 mg total) by mouth every 8 (eight) hours as needed for muscle spasms.   No facility-administered encounter medications on file as of 08/22/2019.   SDOH Screenings   Alcohol Screen:   . Last Alcohol Screening Score (AUDIT):   Depression (PHQ2-9): Medium Risk  . PHQ-2 Score: 5  Financial Resource Strain:   . Difficulty of Paying Living Expenses:   Food Insecurity:   . Worried About Estate manager/land agent  of Food in the Last Year:   . [place] in the Last Year:   Housing:   . Last Housing Risk Score:   Physical Activity:   . Days of Exercise per Week:   . Minutes of Exercise per Session:   Social Connections:   . Frequency of Communication with Friends and Family:   . Frequency of Social Gatherings with Friends and Family:   . Attends Religious Services:   . Active Member of Clubs or Organizations:   . Attends Archivist Meetings:   [name]  Marital Status:   Stress:   . Feeling of Stress :   Tobacco Use: Medium Risk  . Smoking Tobacco Use: Former Smoker  . Smokeless Tobacco Use: Never Used  Transportation Needs:   . Film/video editor (Medical):   [name] Lack of Transportation (Non-Medical):      Current Diagnosis/Assessment:  Goals Addressed            This Visit's Progress   . Chronic Care Management Pharmacy Care Plan       CARE PLAN ENTRY (see longitudinal plan of care for additional care plan information)  Current Barriers:  . Chronic Disease Management support, education, and care coordination needs related to HTN, HLD, Depression and anxiety, Allergic Rhinitis, Hypothyroidism, GERD, Vitamin B12 Deficiency, Vitamin D Deficiency, Osteopenia   Hypertension BP Readings from Last 3 Encounters:  08/08/19 98/70  08/02/19 96/68  10/22/18 118/70   . Pharmacist Clinical Goal(s): o Over the next 90 days, patient will work with PharmD and providers to maintain BP goal <140/90 . Current regimen:   Amlodipine 5mg  daily  Telmisartan 80mg  daily . Interventions: o Requested patient to check blood pressure 2-3 times per week and record o Also check BP when feeling lightheaded . Patient self care activities - Over the next 90 days, patient will: o Check BP 2-3 times per week, document, and provide at future appointments o Ensure daily salt intake < 2300 mg/Deshane Cotroneo  Hyperlipidemia Lab Results  Component Value Date/Time   LDLCALC 124 (H) 10/22/2018 03:16 PM   LDLDIRECT 148.3 09/01/2010 10:41 AM   . Pharmacist Clinical Goal(s): o Over the next 90 days, patient will work with PharmD and providers to achieve LDL goal < 100 . Current regimen:  o Simvastatin 20mg  daily . Interventions: o Discussed LDL goals and possible need for statin titration if LDL >100 . Patient self care activities - Over the next 90 days, patient will: o Maintain cholesterol medication regimen.   Osteopenia . Pharmacist Clinical  Goal(s) o Over the next 90 days, patient will work with PharmD and providers to reduce risk of fracture due to osteopenia . Current regimen:   Vitamin D 2000 units every other Eran Windish  Tums 500mg  as needed (uses multiple daily) . Interventions: o Discussed option of Prolia to replace fosamax . Patient self care activities - Over the next 90 days, patient will: o Schedule follow up with Dr. Lorelei Pont and discuss Prolia  Medication management . Pharmacist Clinical Goal(s): o Over the next 90 days, patient will work with PharmD and providers to maintain optimal medication adherence . [facility] . Interventions o Comprehensive medication review performed. o Continue current medication management strategy . Patient self care activities - Over the next 90 days, patient will: o Focus on medication adherence by filling and taking medications appropriately  o Take medications as prescribed o Report any questions or concerns to PharmD and/or provider(s)  Please see past updates  related to this goal by clicking on the "Past Updates" button in the selected goal        Social Dx:  Dorie Rank to [facility] in 1973 due to husband's job. 2 kids Age 59 and 76. Son in [place] (w/ 2 sons). Daughter in [place].  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 Husband [name]) had an aortic dissection same time as her mother had a stroke. Her mom passed away.  Hypertension   BP goal <140/90  Office blood pressures are  BP Readings from Last 3 Encounters:  08/08/19 98/70  08/02/19 96/68  10/22/18 118/70    Patient has failed these meds in the past: chlorthalidone (hypotension in combo with other meds?), ramipril (listed in allergies)  Patient is currently controlled on the following medications:   Amlodipine 5mg  daily  Telmisartan 80mg  daily  Patient checks BP at home infrequently  Last checked couple of  weeks a go prior to today. BP always less than 130 per patient   We discussed [place] blood pressure measurement technique  Update 08/22/19 Last time she checked it was low.  Has had some lightheadedness, but feels this is due to post surgery. Encouraged patient to check her BP daily to assure low BP is not the cause of her light headedness.   Plan -Check BP 2-3 times per week especially when feeling lightheaded -Continue current medications   Hyperlipidemia   Lipid Panel     Component Value Date/Time   CHOL 195 10/22/2018 1516   TRIG 168.0 (H) 10/22/2018 1516   HDL 37.90 (L) 10/22/2018 1516   CHOLHDL 5 10/22/2018 1516   VLDL 33.6 10/22/2018 1516   LDLCALC 124 (H) 10/22/2018 1516   LDLDIRECT 148.3 09/01/2010 1041     ASCVD 10-year risk: 19.7% (intermediate)  LDL Goal <100  Patient has failed these meds in past: Noned Patient is currently uncontrolled on the following medications:   Simvastatin 20mg  daily  Denies any side effects  We discussed:  diet and exercise extensively   Update 08/22/19 Get scheduled for followup visit w/ Dr. Lorelei Pont ~10/2019? Patient states she will schedule visit w/ Dr. Lorelei Pont  Plan -Needs updated lipid panel  -Consider statin titration. Will discuss further after updated lipid panel -Continue current medications and control with diet and exercise    Depression   Depression screen [facility] 2/9 08/22/2019 11/19/2018 11/14/2017  Decreased Interest 1 0 3  Down, Depressed, Hopeless 0 0 3  PHQ - 2 Score 1 0 6  Altered sleeping 2 - 3  Tired, decreased energy 2 - 3  Change in appetite 0 - 0  Feeling bad or failure about yourself  0 - 3  Trouble concentrating 0 - 3  Moving slowly or fidgety/restless 0 - 3  Suicidal thoughts 0 - 0  PHQ-9 Score 5 - 21  Difficult doing work/chores - - Somewhat difficult  Some recent data might be hidden    Patient has failed these meds in past: fluoxetine (not as effective), trintellix (cost) Patient is  currently controlled on the following medications:   Bupropion 150mg  twice daily   She was increased to 200mg , only took it for a couple of days and decided she didn't need the increased dose and moved back down to 150mg .   She states she feels fine with the 150mg  tablet.  Update 08/22/19 PHQ9: 5 (patient feels any symptoms are due to surgery and being in house due to  COVID)  Plan -Continue current medications  GERD    Patient has failed these meds in past:  Patient is currently stable on the following medications:   Fmotidine 20mg  2-3 times per week  Tums as needed   Has heart burn symptoms daily, but does not wish to use heartburn medicine daily Famotidine 20mg  takes 3-4 times per week. Rarely does twice daily Uses a lot of tums  We discussed:  Letting us know if her GERD becomes more of an issue   Update 08/22/19 Resolved since D/C of fosamax? Back to baseline. Fosamax made it worse than baseline  Plan -Continue current medications   Osteopenia   Last DEXA Scan: 01/08/19  T-Score femoral neck: -1.8R  T-Score forearm radius: -2.2  10-year probability of major osteoporotic fracture: 21.7%  10-year probability of hip fracture: 12.1%  VITD  Date Value Ref Range Status  11/03/2016 69.71 30.00 - 100.00 ng/mL Final     Patient is a candidate for pharmacologic treatment due to T-Score -1.0 to -2.5 and 10-year risk of major osteoporotic fracture > 20% and T-Score -1.0 to -2.5 and 10-year risk of hip fracture > 3%  Patient has failed these meds in past: fosamax (GERD - stopped around 06/2019) Patient is currently uncontrolled on the following medications:   Vitamin D 2000 units every other Iren Whipp  Tums 500mg  as needed (uses multiple daily)  We discussed:  Option of Prolia for treatment. Pt seems agreeable  Plan -Consider starting Prolia to replace fosamax. Pt to discuss with Dr. Lorelei Pont -Repeat DEXA 2022  Miscellaneous Meds Clonazepam 0.5mg  - for insomnia by  [place] 1/2-1 tab HS. She uses 1/4 tab

## 2019-08-26 NOTE — Progress Notes (Signed)
Remote pacemaker transmission.   

## 2019-09-05 ENCOUNTER — Other Ambulatory Visit: Payer: Self-pay | Admitting: Family Medicine

## 2019-09-05 DIAGNOSIS — F339 Major depressive disorder, recurrent, unspecified: Secondary | ICD-10-CM

## 2019-09-20 DIAGNOSIS — M65331 Trigger finger, right middle finger: Secondary | ICD-10-CM | POA: Diagnosis not present

## 2019-09-20 DIAGNOSIS — Z96611 Presence of right artificial shoulder joint: Secondary | ICD-10-CM | POA: Diagnosis not present

## 2019-09-20 DIAGNOSIS — Z471 Aftercare following joint replacement surgery: Secondary | ICD-10-CM | POA: Diagnosis not present

## 2019-09-23 ENCOUNTER — Telehealth: Payer: Self-pay | Admitting: Pharmacist

## 2019-09-23 ENCOUNTER — Ambulatory Visit: Payer: PPO | Admitting: Family Medicine

## 2019-09-23 NOTE — Progress Notes (Addendum)
Chronic Care Management Pharmacy Assistant   Name: Theresa Mejia  MRN: [id number] DOB: [birth year]  Reason for Encounter: Disease State  Patient Questions:  1.  Have you seen any other providers since your last visit? No  2.  Any changes in your medicines or health? No    PCP : Copland, Gay Filler, MD   Their chronic conditions include: HTN, HLD, Depression and anxiety, Allergic Rhinitis, Hypothyroidism, GERD, Vitamin B12 Deficiency, Vitamin D Deficiency, Osteopenia.   There have been no OV's, Consults, or Hospitalizations since the patient's last visit with the clinical pharmacist.  Allergies:   Allergies  Allergen Reactions  . Ramipril     Unknown reaction  . Sulfa Antibiotics     Says it dropped her blood pressure really low.   . Sulfonamide Derivatives     REACTION: hypotension    Medications: Outpatient Encounter Medications as of 09/23/2019  Medication Sig  . amLODipine (NORVASC) 5 MG tablet TAKE 1 TABLET BY MOUTH EVERY DAY (Patient taking differently: Take 5 mg by mouth at bedtime. )  . buPROPion (WELLBUTRIN SR) 150 MG 12 hr tablet TAKE 1 TABLET BY MOUTH TWICE A DAY  . calcium carbonate (TUMS - DOSED IN MG ELEMENTAL CALCIUM) 500 MG chewable tablet Chew 1 tablet by mouth 4 (four) times daily as needed for indigestion or heartburn.  . Cholecalciferol (VITAMIN D3) 50 MCG (2000 UT) TABS Take 2,000 Units by mouth every other day.   . clonazePAM (KLONOPIN) 0.5 MG tablet Take 1/2 at bedtime as needed for insomnia, may increase to a whole tablet if needed (Patient taking differently: Take 0.25-0.5 mg by mouth at bedtime as needed (sleep). )  . Cyanocobalamin (B-12) 5000 MCG CAPS Take 5,000 mcg by mouth every other day.  . famotidine (PEPCID) 10 MG tablet Take 10 mg by mouth daily as needed for heartburn or indigestion.  . fluticasone (FLONASE) 50 MCG/ACT nasal spray SPRAY 2 SPRAYS INTO EACH NOSTRIL EVERY DAY (Patient taking differently: Place 2 sprays into both nostrils daily.  )  . HYDROcodone-acetaminophen (NORCO/VICODIN) 5-325 MG tablet Take 1-2 tablets by mouth every 4 (four) hours as needed for moderate pain.  [name] ipratropium (ATROVENT) 0.03 % nasal spray PLACE 2 SPRAYS INTO THE NOSE 4 (FOUR) TIMES DAILY. USE AS NEEDED FOR POST NASAL DRIP (Patient taking differently: Place 2 sprays into both nostrils daily. )  . levocetirizine (XYZAL) 5 MG tablet TAKE 1 TABLET BY MOUTH EVERY DAY IN THE EVENING (Patient taking differently: Take 5 mg by mouth daily. )  . levothyroxine (SYNTHROID) 125 MCG tablet Take 1 tablet (125 mcg total) by mouth daily. (Patient taking differently: Take 62.5 mcg by mouth daily. )  . Polyvinyl Alcohol-Povidone (REFRESH OP) Place 1 drop into both eyes daily as needed (dry eyes).  . simvastatin (ZOCOR) 20 MG tablet Take 1 tablet (20 mg total) by mouth at bedtime.  [name] telmisartan (MICARDIS) 80 MG tablet TAKE 1 TABLET BY MOUTH EVERY DAY (Patient taking differently: Take 80 mg by mouth daily. )  . tiZANidine (ZANAFLEX) 4 MG tablet Take 1 tablet (4 mg total) by mouth every 8 (eight) hours as needed for muscle spasms.   No facility-administered encounter medications on file as of 09/23/2019.    Current Diagnosis: Patient Active Problem List   Diagnosis Date Noted  . Osteopenia 01/09/2019  . Pansinusitis 03/06/2018  . Cough 03/06/2018  . Orthostatic hypotension 12/06/2016  . Viral URI with cough 04/20/2016  . Sinus node dysfunction ([place]) 11/11/2015  .  Routine general medical examination at a health care facility 06/04/2015  . Insomnia with sleep apnea 06/04/2015  . Bronchitis, chronic obstructive w acute bronchitis ([place]) 12/10/2014  . Lumbar disc disease 09/01/2010  . Obstructive sleep apnea 05/31/2007  . Hyperlipidemia with target LDL less than 130 02/05/2007  . Depression with anxiety 02/05/2007  . Allergic rhinitis 02/05/2007  . Hypothyroidism 02/02/2007  . Essential hypertension 02/02/2007    Goals Addressed   None    Reviewed chart prior  to disease state call. Spoke with patient regarding BP  Recent Office Vitals: BP Readings from Last 3 Encounters:  08/08/19 98/70  08/02/19 96/68  10/22/18 118/70   Pulse Readings from Last 3 Encounters:  08/08/19 72  08/02/19 77  10/22/18 76    Wt Readings from Last 3 Encounters:  08/08/19 151 lb (68.5 kg)  08/02/19 151 lb (68.5 kg)  10/22/18 152 lb (68.9 kg)     Kidney Function Lab Results  Component Value Date/Time   CREATININE 0.84 08/01/2019 10:58 AM   CREATININE 0.87 10/22/2018 03:16 PM   CREATININE 0.70 11/04/2015 11:06 AM   CREATININE 0.69 09/27/2012 07:35 AM   GFR 63.34 10/22/2018 03:16 PM   GFRNONAA >60 08/01/2019 10:58 AM   GFRAA >60 08/01/2019 10:58 AM    BMP Latest Ref Rng & Units 08/01/2019 10/22/2018 06/08/2017  Glucose 70 - 99 mg/dL 92 94 84  BUN 8 - 23 mg/dL 29(H) 27(H) 16  Creatinine 0.44 - 1.00 mg/dL 0.84 0.87 0.75  Sodium 135 - 145 mmol/L 140 140 138  Potassium 3.5 - 5.1 mmol/L 4.0 3.9 3.6  Chloride 98 - 111 mmol/L 104 104 101  CO2 22 - 32 mmol/L 28 29 29   Calcium 8.9 - 10.3 mg/dL 9.3 9.4 9.2    . Current antihypertensive regimen:   Amlodipine 5mg  daily  Telmisartan 80mg  daily . How often are you checking your Blood Pressure? 1-2x per week. Patient had been checking her blood pressure daily for a few weeks. . Current home BP readings: Patient states her last blood pressure readings from last week were 113/79, 149/88, 130/88, 125/87, 132/85, 103/69, 110/69. She has not checked in since September 6th 2021. . What recent interventions/DTPs have been made by any provider to improve Blood Pressure control since last CPP Visit: none . Any recent hospitalizations or ED visits since last visit with CPP? No . What diet changes have been made to improve Blood Pressure Control?  o None at this time . What exercise is being done to improve your Blood Pressure Control?  o Patient states she had shoulder replacemement  Adherence Review: Is the patient  currently on ACE/ARB medication? Yes Does the patient have >5 day gap between last estimated fill dates? No   Patient requesting a medication to help her sleep. She has not been able to stay asleep throughout the night. Reports waking up every couple of hours. She has tried a CBD gummy in the past with relief.  Follow-Up:  Pharmacist Review   Fanny Skates, [place] Pharmacist Assistant [phone]  Called patient to discuss if she has tried melatonin. She states she has tried melatonin and this hasn't helped her. She has also tried Valerian root and that hasn't helped much either. She discussed her use of CBD gummy (20mg  of Delta 8 THC/gummy) and discussed that if trying a CBD product, she should look for a certificate of analysis (COA) that better assures the quality of the product. No major interactions noted with CBD and prescription  medication.-YOVZ Reviewed by: De Blanch, PharmD Clinical Pharmacist [facility] [phone]

## 2019-09-25 DIAGNOSIS — Z96611 Presence of right artificial shoulder joint: Secondary | ICD-10-CM | POA: Diagnosis not present

## 2019-09-25 DIAGNOSIS — M19011 Primary osteoarthritis, right shoulder: Secondary | ICD-10-CM | POA: Diagnosis not present

## 2019-09-27 ENCOUNTER — Encounter: Payer: Self-pay | Admitting: Family Medicine

## 2019-09-27 ENCOUNTER — Telehealth: Payer: Self-pay | Admitting: Family Medicine

## 2019-09-27 DIAGNOSIS — F5101 Primary insomnia: Secondary | ICD-10-CM

## 2019-09-27 MED ORDER — CLONAZEPAM 0.5 MG PO TABS: ORAL_TABLET | ORAL | 0 refills | Status: DC

## 2019-09-27 NOTE — Telephone Encounter (Signed)
My chart message sent to pt.

## 2019-09-27 NOTE — Telephone Encounter (Signed)
Caller name: Earnest Call back number: [phone]  Patient states she had a complete shoulder replacement 7 weeks ago. She is doing well. She is only taking either ibuprofen or tylenol for pain during the day. At night she is taking tizanidine 4 mg. She is having difficulty sleeping at night and is wondering if you could please prescribe something she could take along with tizanidine.  Patient hope you have a wonderful weekend.  Pharmacy: [facility] [place], [place] - [address]  [place], [place] [address]  Phone:  [phone] Fax:  [phone]

## 2019-09-30 DIAGNOSIS — Z96611 Presence of right artificial shoulder joint: Secondary | ICD-10-CM | POA: Diagnosis not present

## 2019-09-30 DIAGNOSIS — M19011 Primary osteoarthritis, right shoulder: Secondary | ICD-10-CM | POA: Diagnosis not present

## 2019-10-02 DIAGNOSIS — Z96611 Presence of right artificial shoulder joint: Secondary | ICD-10-CM | POA: Diagnosis not present

## 2019-10-02 DIAGNOSIS — M19011 Primary osteoarthritis, right shoulder: Secondary | ICD-10-CM | POA: Diagnosis not present

## 2019-10-07 DIAGNOSIS — Z96611 Presence of right artificial shoulder joint: Secondary | ICD-10-CM | POA: Diagnosis not present

## 2019-10-07 DIAGNOSIS — M19011 Primary osteoarthritis, right shoulder: Secondary | ICD-10-CM | POA: Diagnosis not present

## 2019-10-08 ENCOUNTER — Telehealth: Payer: Self-pay | Admitting: Pharmacist

## 2019-10-08 NOTE — Progress Notes (Addendum)
Verified Adherence Gap Information. Patient is %100 medication compliant with Statin (Simvastatin).  Fanny Skates, [place] Pharmacist Assistant [phone]  Reviewed by: De Blanch, PharmD Clinical Pharmacist [facility] [phone]

## 2019-10-09 DIAGNOSIS — Z96611 Presence of right artificial shoulder joint: Secondary | ICD-10-CM | POA: Diagnosis not present

## 2019-10-09 DIAGNOSIS — M19011 Primary osteoarthritis, right shoulder: Secondary | ICD-10-CM | POA: Diagnosis not present

## 2019-10-10 ENCOUNTER — Other Ambulatory Visit: Payer: Self-pay | Admitting: Family Medicine

## 2019-10-13 ENCOUNTER — Other Ambulatory Visit: Payer: Self-pay | Admitting: Family Medicine

## 2019-10-13 DIAGNOSIS — E785 Hyperlipidemia, unspecified: Secondary | ICD-10-CM

## 2019-10-14 DIAGNOSIS — M19011 Primary osteoarthritis, right shoulder: Secondary | ICD-10-CM | POA: Diagnosis not present

## 2019-10-14 DIAGNOSIS — Z96611 Presence of right artificial shoulder joint: Secondary | ICD-10-CM | POA: Diagnosis not present

## 2019-10-16 DIAGNOSIS — Z96611 Presence of right artificial shoulder joint: Secondary | ICD-10-CM | POA: Diagnosis not present

## 2019-10-16 DIAGNOSIS — M19011 Primary osteoarthritis, right shoulder: Secondary | ICD-10-CM | POA: Diagnosis not present

## 2019-10-22 NOTE — Patient Instructions (Addendum)
Good to see you again today!  I will be in touch with your labs asap Max dose of guifenesin is 2400 mg a day- or 1200 twice daily We will get you set up for prolia    Health Maintenance After Age 77 After age 79, you are at a higher risk for certain long-term diseases and infections as well as injuries from falls. Falls are a major cause of broken bones and head injuries in people who are older than age 55. Getting regular preventive care can help to keep you healthy and well. Preventive care includes getting regular testing and making lifestyle changes as recommended by your health care provider. Talk with your health care provider about:  Which screenings and tests you should have. A screening is a test that checks for a disease when you have no symptoms.  A diet and exercise plan that is right for you. What should I know about screenings and tests to prevent falls? Screening and testing are the best ways to find a health problem early. Early diagnosis and treatment give you the best chance of managing medical conditions that are common after age 41. Certain conditions and lifestyle choices may make you more likely to have a fall. Your health care provider may recommend:  Regular vision checks. Poor vision and conditions such as cataracts can make you more likely to have a fall. If you wear glasses, make sure to get your prescription updated if your vision changes.  Medicine review. Work with your health care provider to regularly review all of the medicines you are taking, including over-the-counter medicines. Ask your health care provider about any side effects that may make you more likely to have a fall. Tell your health care provider if any medicines that you take make you feel dizzy or sleepy.  Osteoporosis screening. Osteoporosis is a condition that causes the bones to get weaker. This can make the bones weak and cause them to break more easily.  Blood pressure screening. Blood pressure  changes and medicines to control blood pressure can make you feel dizzy.  Strength and balance checks. Your health care provider may recommend certain tests to check your strength and balance while standing, walking, or changing positions.  Foot health exam. Foot pain and numbness, as well as not wearing proper footwear, can make you more likely to have a fall.  Depression screening. You may be more likely to have a fall if you have a fear of falling, feel emotionally low, or feel unable to do activities that you used to do.  Alcohol use screening. Using too much alcohol can affect your balance and may make you more likely to have a fall. What actions can I take to lower my risk of falls? General instructions  Talk with your health care provider about your risks for falling. Tell your health care provider if: ? You fall. Be sure to tell your health care provider about all falls, even ones that seem minor. ? You feel dizzy, sleepy, or off-balance.  Take over-the-counter and prescription medicines only as told by your health care provider. These include any supplements.  Eat a healthy diet and maintain a healthy weight. A healthy diet includes low-fat dairy products, low-fat (lean) meats, and fiber from whole grains, beans, and lots of fruits and vegetables. Home safety  Remove any tripping hazards, such as rugs, cords, and clutter.  Install safety equipment such as grab bars in bathrooms and safety rails on stairs.  Keep rooms and  walkways well-lit. Activity   Follow a regular exercise program to stay fit. This will help you maintain your balance. Ask your health care provider what types of exercise are appropriate for you.  If you need a cane or walker, use it as recommended by your health care provider.  Wear supportive shoes that have nonskid soles. Lifestyle  Do not drink alcohol if your health care provider tells you not to drink.  If you drink alcohol, limit how much you  have: ? 0-1 drink a day for women. ? 0-2 drinks a day for men.  Be aware of how much alcohol is in your drink. In the [place] one drink equals one typical bottle of beer (12 oz), one-half glass of wine (5 oz), or one shot of hard liquor (1 oz).  Do not use any products that contain nicotine or tobacco, such as cigarettes and e-cigarettes. If you need help quitting, ask your health care provider. Summary  Having a healthy lifestyle and getting preventive care can help to protect your health and wellness after age 30.  Screening and testing are the best way to find a health problem early and help you avoid having a fall. Early diagnosis and treatment give you the best chance for managing medical conditions that are more common for people who are older than age 10.  Falls are a major cause of broken bones and head injuries in people who are older than age 82. Take precautions to prevent a fall at home.  Work with your health care provider to learn what changes you can make to improve your health and wellness and to prevent falls. This information is not intended to replace advice given to you by your health care provider. Make sure you discuss any questions you have with your health care provider. Document Revised: 04/19/2018 Document Reviewed: 11/09/2016 Elsevier Patient Education  2020 [employer].

## 2019-10-22 NOTE — Progress Notes (Addendum)
[facility] [place], [place], [place] [address] [phone]  Date:  10/24/2019   Name:  Theresa Mejia   DOB:  12/19/1940   MRN:  [id number]  PCP:  Darreld Mclean, MD    Chief Complaint: Annual Exam   History of Present Illness:  Theresa Mejia is a 79 y.o. very pleasant female patient who presents with the following:  Pt is here today for a CPE Last seen by myself about one year ago- History of hyperlipidemia, HTN, hypothyroidism, OSA, pacemaker implanted in 2017 At last visit we increased her dose of wellbutrin some due to sx of depression She is now back on 150 BID and feels like this is working pretty well for her   She had a right total shoulder in July of this year per Dr Tamera Punt- it is a slow recovery process but she is doing ok She just started driving again since her surgery She has fall allergies- she has been taking some zyrtec, benadryl on occasion mucinex seems to help her   Flu vaccine- done  Tetanus - UTD covid done- booster done also  mammo UTD Dexa: UTD Osteopenia with elevated fracture risk.  We tried fosamax but she had terrible GERD Will start Prolia Colon 2017  CMP, CBC UTD Needs lipids, TSH- she is not fasting  Patient Active Problem List   Diagnosis Date Noted  . Osteopenia 01/09/2019  . Pansinusitis 03/06/2018  . Cough 03/06/2018  . Orthostatic hypotension 12/06/2016  . Viral URI with cough 04/20/2016  . Sinus node dysfunction ([place]) 11/11/2015  . Routine general medical examination at a health care facility 06/04/2015  . Insomnia with sleep apnea 06/04/2015  . Bronchitis, chronic obstructive w acute bronchitis ([place]) 12/10/2014  . Lumbar disc disease 09/01/2010  . Obstructive sleep apnea 05/31/2007  . Hyperlipidemia with target LDL less than 130 02/05/2007  . Depression with anxiety 02/05/2007  . Allergic rhinitis 02/05/2007  . Hypothyroidism 02/02/2007  . Essential  hypertension 02/02/2007    Past Medical History:  Diagnosis Date  . Allergic rhinitis   . Depression   . DJD (degenerative joint disease)   . GERD (gastroesophageal reflux disease)   . Headache(784.0)    sinus  . Hypercholesteremia   . Hypersomnia   . Hypertension   . Hypothyroidism   . Internal hemorrhoid 11/2008   s/p banding ([place])  . Lumbar disc disease   . OBSTRUCTIVE SLEEP APNEA    NPSG 04/29/07- AHI 1.1/hr, RDI 21.2/hr. Weight 176 lbs. CPAP was tried based on the RDI.    [name] OVERACTIVE BLADDER    pt unaware  . Presence of permanent cardiac pacemaker 11/11/2015  . Reactive depression (situational)   . Sinus node dysfunction ([facility])   . Spondylosis, cervical     Past Surgical History:  Procedure Laterality Date  . BIOPSY BREAST    . BREAST EXCISIONAL BIOPSY Left 2011  . CATARACT EXTRACTION W/ INTRAOCULAR LENS IMPLANT Right   . COLONOSCOPY    . EP IMPLANTABLE DEVICE N/A 11/11/2015   Procedure: Pacemaker Implant;  Surgeon: Evans Lance, MD;  Location: [facility];  Service: Cardiovascular;  Laterality: N/A;  . eye lid lift    . INSERT / REPLACE / REMOVE PACEMAKER  11/11/2015  . KNEE ARTHROSCOPY     Dr. [name]  . MOUTH SURGERY  08/25/2017   implant fell out due to bone loss  . REVERSE SHOULDER ARTHROPLASTY  Right 08/08/2019   Procedure: REVERSE SHOULDER ARTHROPLASTY;  Surgeon: Tania Ade, MD;  Location: [facility];  Service: Orthopedics;  Laterality: Right;  . rotator cuff surgery Right    Dr. [name]  . TUBAL LIGATION      Social History   Tobacco Use  . Smoking status: Former Smoker    Years: 8.00    Types: Cigarettes    Quit date: 01/10/1966    Years since quitting: 53.8  . Smokeless tobacco: Never Used  Vaping Use  . Vaping Use: Never used  Substance Use Topics  . Alcohol use: No  . Drug use: No    Family History  Problem Relation Age of Onset  . Crohn's disease Father 59  . Macular degeneration Mother   . Cancer Mother 89       bladder,  never confirmed with biospy  . Hypothyroidism Mother   . Tremor Mother   . Stroke Mother 45  . Parkinsonism Maternal Grandmother   . Alzheimer's disease Paternal Aunt   . Alzheimer's disease Paternal Uncle   . Diabetes Paternal Grandmother   . Colon cancer Neg Hx   . Esophageal cancer Neg Hx   . Rectal cancer Neg Hx     Allergies  Allergen Reactions  . Ramipril     Unknown reaction  . Sulfa Antibiotics     Says it dropped her blood pressure really low.   . Sulfonamide Derivatives     REACTION: hypotension    Medication list has been reviewed and updated.  Current Outpatient Medications on File Prior to Visit  Medication Sig Dispense Refill  . amLODipine (NORVASC) 5 MG tablet TAKE 1 TABLET BY MOUTH EVERY DAY 90 tablet 1  . buPROPion (WELLBUTRIN SR) 150 MG 12 hr tablet TAKE 1 TABLET BY MOUTH TWICE A DAY 180 tablet 1  . calcium carbonate (TUMS - DOSED IN MG ELEMENTAL CALCIUM) 500 MG chewable tablet Chew 1 tablet by mouth 4 (four) times daily as needed for indigestion or heartburn.    . Cholecalciferol (VITAMIN D3) 50 MCG (2000 UT) TABS Take 2,000 Units by mouth every other day.     . clonazePAM (KLONOPIN) 0.5 MG tablet Take 1/2 at bedtime as needed for insomnia, may increase to a whole tablet if needed 30 tablet 0  . famotidine (PEPCID) 10 MG tablet Take 10 mg by mouth daily as needed for heartburn or indigestion.    . fluticasone (FLONASE) 50 MCG/ACT nasal spray SPRAY 2 SPRAYS INTO EACH NOSTRIL EVERY DAY (Patient taking differently: Place 2 sprays into both nostrils daily. ) 48 mL 1  . ipratropium (ATROVENT) 0.03 % nasal spray PLACE 2 SPRAYS INTO THE NOSE 4 (FOUR) TIMES DAILY. USE AS NEEDED FOR POST NASAL DRIP (Patient taking differently: Place 2 sprays into both nostrils daily. ) 30 mL 6  . levocetirizine (XYZAL) 5 MG tablet TAKE 1 TABLET BY MOUTH EVERY DAY IN THE EVENING (Patient taking differently: Take 5 mg by mouth daily. ) 90 tablet 3  . levothyroxine (SYNTHROID) 125 MCG  tablet Take 1 tablet (125 mcg total) by mouth daily. (Patient taking differently: Take 62.5 mcg by mouth daily. ) 90 tablet 1  . Polyvinyl Alcohol-Povidone (REFRESH OP) Place 1 drop into both eyes daily as needed (dry eyes).    . simvastatin (ZOCOR) 20 MG tablet TAKE 1 TABLET BY MOUTH EVERYDAY AT BEDTIME 90 tablet 1  . telmisartan (MICARDIS) 80 MG tablet TAKE 1 TABLET BY MOUTH EVERY DAY (Patient taking differently: Take 80 mg  by mouth daily. ) 90 tablet 3  . tiZANidine (ZANAFLEX) 4 MG tablet Take 1 tablet (4 mg total) by mouth every 8 (eight) hours as needed for muscle spasms. 30 tablet 1   No current facility-administered medications on file prior to visit.    Review of Systems:  As per HPI- otherwise negative.   Physical Examination: Vitals:   10/24/19 0939  BP: 104/64  Pulse: 76  Resp: 16  SpO2: 93%   Vitals:   10/24/19 0939  Weight: 149 lb (67.6 kg)  Height: 5' (1.524 m)   Body mass index is 29.1 kg/m. Ideal Body Weight: Weight in (lb) to have BMI = 25: 127.7  GEN: no acute distress.   Overweight, looks well  HEENT: Atraumatic, Normocephalic.   Bilateral TM wnl, oropharynx normal.  PEERL,EOMI.   Ears and Nose: No external deformity. CV: RRR, No M/G/R. No JVD. No thrill. No extra heart sounds. PULM: CTA B, no wheezes, crackles, rhonchi. No retractions. No resp. distress. No accessory muscle use. ABD: S, NT, ND, +BS. No rebound. No HSM. EXTR: No c/c/e PSYCH: Normally interactive. Conversant.    Assessment and Plan: Physical exam  Essential hypertension  Dyslipidemia - Plan: Lipid panel  Hypothyroidism (acquired) - Plan: TSH  Screening for diabetes mellitus - Plan: Hemoglobin A1c  Here today for CPE She is trying to get back into exercise since her shoulder replacement Labs pending as above Plan to start her on prolia for her bone density- will get insurance approval.  She cannot tolerate oral fosamax   Will plan further follow- up pending labs. She is happy  on current dose of wellbutrin  This visit occurred during the SARS-CoV-2 public health emergency.  Safety protocols were in place, including screening questions prior to the visit, additional usage of staff PPE, and extensive cleaning of exam room while observing appropriate contact time as indicated for disinfecting solutions.    Signed Lamar Blinks, MD  Addendum 10/15, received labs as below Results for orders placed or performed in visit on 10/24/19  Lipid panel  Result Value Ref Range   Cholesterol 126 <200 mg/dL   HDL 37 (L) > OR = 50 mg/dL   Triglycerides 91 <150 mg/dL   LDL Cholesterol (Calc) 72 mg/dL (calc)   Total CHOL/HDL Ratio 3.4 <5.0 (calc)   Non-HDL Cholesterol (Calc) 89 <130 mg/dL (calc)  Hemoglobin A1c  Result Value Ref Range   Hgb A1c MFr Bld 5.3 <5.7 % of total Hgb   Mean Plasma Glucose 105 (calc)   eAG (mmol/L) 5.8 (calc)  TSH  Result Value Ref Range   TSH 1.55 0.40 - 4.50 mIU/L

## 2019-10-24 ENCOUNTER — Telehealth: Payer: Self-pay

## 2019-10-24 ENCOUNTER — Ambulatory Visit (INDEPENDENT_AMBULATORY_CARE_PROVIDER_SITE_OTHER): Payer: PPO | Admitting: Family Medicine

## 2019-10-24 ENCOUNTER — Encounter: Payer: Self-pay | Admitting: Family Medicine

## 2019-10-24 ENCOUNTER — Other Ambulatory Visit: Payer: Self-pay

## 2019-10-24 VITALS — BP 104/64 | HR 76 | Resp 16 | Ht 60.0 in | Wt 149.0 lb

## 2019-10-24 DIAGNOSIS — E039 Hypothyroidism, unspecified: Secondary | ICD-10-CM | POA: Diagnosis not present

## 2019-10-24 DIAGNOSIS — Z0000 Encounter for general adult medical examination without abnormal findings: Secondary | ICD-10-CM | POA: Diagnosis not present

## 2019-10-24 DIAGNOSIS — I10 Essential (primary) hypertension: Secondary | ICD-10-CM

## 2019-10-24 DIAGNOSIS — Z131 Encounter for screening for diabetes mellitus: Secondary | ICD-10-CM

## 2019-10-24 DIAGNOSIS — E785 Hyperlipidemia, unspecified: Secondary | ICD-10-CM | POA: Diagnosis not present

## 2019-10-24 NOTE — Telephone Encounter (Signed)
Patient discussed osteopenia treatments with provider at visit today. She has tried and failed fosamax due to GERD. She discussed prolia with provider and I and would like to get started on that.   I have sent clinical information to Insurance company to find out cost and coverage. Will follow up with patient once received in 3-5 business days.

## 2019-10-25 ENCOUNTER — Encounter: Payer: Self-pay | Admitting: Family Medicine

## 2019-10-25 LAB — HEMOGLOBIN A1C
Hgb A1c MFr Bld: 5.3 % of total Hgb (ref <5.7)
Mean Plasma Glucose: 105 (calc)
eAG (mmol/L): 5.8 (calc)

## 2019-10-25 LAB — LIPID PANEL
Cholesterol: 126 mg/dL (ref <200)
HDL: 37 mg/dL — ABNORMAL LOW (ref >=50)
LDL Cholesterol (Calc): 72 mg/dL (calc)
Non-HDL Cholesterol (Calc): 89 mg/dL (calc) (ref <130)
Total CHOL/HDL Ratio: 3.4 (calc) (ref <5.0)
Triglycerides: 91 mg/dL (ref <150)

## 2019-10-25 LAB — TSH: TSH: 1.55 mIU/L (ref 0.40–4.50)

## 2019-10-28 DIAGNOSIS — M19011 Primary osteoarthritis, right shoulder: Secondary | ICD-10-CM | POA: Diagnosis not present

## 2019-10-28 DIAGNOSIS — Z96611 Presence of right artificial shoulder joint: Secondary | ICD-10-CM | POA: Diagnosis not present

## 2019-10-30 DIAGNOSIS — Z96611 Presence of right artificial shoulder joint: Secondary | ICD-10-CM | POA: Diagnosis not present

## 2019-10-30 DIAGNOSIS — M19011 Primary osteoarthritis, right shoulder: Secondary | ICD-10-CM | POA: Diagnosis not present

## 2019-11-01 DIAGNOSIS — Z471 Aftercare following joint replacement surgery: Secondary | ICD-10-CM | POA: Diagnosis not present

## 2019-11-01 DIAGNOSIS — Z96611 Presence of right artificial shoulder joint: Secondary | ICD-10-CM | POA: Diagnosis not present

## 2019-11-04 DIAGNOSIS — H2512 Age-related nuclear cataract, left eye: Secondary | ICD-10-CM | POA: Diagnosis not present

## 2019-11-04 DIAGNOSIS — Z961 Presence of intraocular lens: Secondary | ICD-10-CM | POA: Diagnosis not present

## 2019-11-04 DIAGNOSIS — H52203 Unspecified astigmatism, bilateral: Secondary | ICD-10-CM | POA: Diagnosis not present

## 2019-11-04 DIAGNOSIS — H04123 Dry eye syndrome of bilateral lacrimal glands: Secondary | ICD-10-CM | POA: Diagnosis not present

## 2019-11-06 DIAGNOSIS — Z96611 Presence of right artificial shoulder joint: Secondary | ICD-10-CM | POA: Diagnosis not present

## 2019-11-06 DIAGNOSIS — M19011 Primary osteoarthritis, right shoulder: Secondary | ICD-10-CM | POA: Diagnosis not present

## 2019-11-12 ENCOUNTER — Other Ambulatory Visit: Payer: Self-pay | Admitting: Family Medicine

## 2019-11-14 ENCOUNTER — Encounter: Payer: Self-pay | Admitting: Family Medicine

## 2019-11-20 ENCOUNTER — Ambulatory Visit (INDEPENDENT_AMBULATORY_CARE_PROVIDER_SITE_OTHER): Payer: PPO

## 2019-11-20 ENCOUNTER — Other Ambulatory Visit: Payer: Self-pay

## 2019-11-20 DIAGNOSIS — I495 Sick sinus syndrome: Secondary | ICD-10-CM | POA: Diagnosis not present

## 2019-11-20 DIAGNOSIS — M85829 Other specified disorders of bone density and structure, unspecified upper arm: Secondary | ICD-10-CM | POA: Diagnosis not present

## 2019-11-20 LAB — CUP PACEART REMOTE DEVICE CHECK
Battery Remaining Percentage: 70 %
Brady Statistic RA Percent Paced: 100 %
Brady Statistic RV Percent Paced: 0 %
Date Time Interrogation Session: 20211110071800
Implantable Lead Implant Date: 20171101
Implantable Lead Implant Date: 20171101
Implantable Lead Location: 753859
Implantable Lead Location: 753860
Implantable Lead Model: 377
Implantable Lead Model: 377
Implantable Lead Serial Number: 49553678
Implantable Lead Serial Number: 49617393
Implantable Pulse Generator Implant Date: 20171101
Lead Channel Impedance Value: 488 Ohm
Lead Channel Impedance Value: 507 Ohm
Lead Channel Pacing Threshold Amplitude: 0.6 V
Lead Channel Pacing Threshold Amplitude: 0.6 V
Lead Channel Pacing Threshold Pulse Width: 0.4 ms
Lead Channel Pacing Threshold Pulse Width: 0.4 ms
Lead Channel Sensing Intrinsic Amplitude: 11.2 mV
Lead Channel Sensing Intrinsic Amplitude: 3.3 mV
Lead Channel Setting Pacing Amplitude: 2 V
Lead Channel Setting Pacing Amplitude: 2 V
Lead Channel Setting Pacing Pulse Width: 0.4 ms
Pulse Gen Model: 394969
Pulse Gen Serial Number: 68817609

## 2019-11-20 MED ORDER — DENOSUMAB 60 MG/ML ~~LOC~~ SOSY
60.0000 mg | PREFILLED_SYRINGE | Freq: Once | SUBCUTANEOUS | Status: AC
  Administered 2019-11-20: 60 mg via SUBCUTANEOUS

## 2019-11-20 NOTE — Progress Notes (Signed)
Pt is here today for prolia injection. Pt was given prolia injection in left subq. Pt tolerated well

## 2019-11-21 ENCOUNTER — Ambulatory Visit: Payer: Self-pay | Admitting: *Deleted

## 2019-11-21 NOTE — Progress Notes (Signed)
Remote pacemaker transmission.   

## 2019-12-13 ENCOUNTER — Telehealth: Payer: Self-pay | Admitting: Pharmacist

## 2019-12-13 NOTE — Progress Notes (Addendum)
Chronic Care Management Pharmacy Assistant   Name: Theresa Mejia  MRN: [id number] DOB: 05-10-1940  Reason for Encounter: HTN Disease State  Patient Questions:  1.  Have you seen any other providers since your last visit? Yes  2.  Any changes in your medicines or health? Yes    PCP : Copland, Gay Filler, MD   Their chronic conditions include: HTN, HLD, Depression and anxiety, Allergic Rhinitis, Hypothyroidism, GERD, Vitamin B12 Deficiency, Vitamin D Deficiency, Osteopenia.   Office Visits: 10-24-2019 (PCP) Patient presented in the office for her annual physical exam. Patient reported recovering well since her right shoulder replacement in July. Per Dr. Lorelei Pont, she would like to start patient on Prolia for her bone density, unable to tolerate Fosamax. Medication changes: B-12 5000 mcg  and Norco 5-325 mg were discontinued. TE on 11-14-2019 State patient is eligible for Prolia coverage with her insurance came into the office 11-21-2019 for Prolia 60 mg injection   Consults: 10-09-2019 -Physical therapy visit  10-07-2019- Physical therapy visit  10-02-2019- Physical therapy visit  09-30-2019 - Physical therapy visit  09-25-2019- PT eval for osteoarthritis of right shoulder  Allergies:   Allergies  Allergen Reactions   Ramipril     Unknown reaction   Sulfa Antibiotics     Says it dropped her blood pressure really low.    Sulfonamide Derivatives     REACTION: hypotension    Medications: Outpatient Encounter Medications as of 12/13/2019  Medication Sig   amLODipine (NORVASC) 5 MG tablet TAKE 1 TABLET BY MOUTH EVERY DAY   buPROPion (WELLBUTRIN SR) 150 MG 12 hr tablet TAKE 1 TABLET BY MOUTH TWICE A DAY   calcium carbonate (TUMS - DOSED IN MG ELEMENTAL CALCIUM) 500 MG chewable tablet Chew 1 tablet by mouth 4 (four) times daily as needed for indigestion or heartburn.   Cholecalciferol (VITAMIN D3) 50 MCG (2000 UT) TABS Take 2,000 Units by mouth every other day.    clonazePAM  (KLONOPIN) 0.5 MG tablet Take 1/2 at bedtime as needed for insomnia, may increase to a whole tablet if needed   famotidine (PEPCID) 10 MG tablet Take 10 mg by mouth daily as needed for heartburn or indigestion.   fluticasone (FLONASE) 50 MCG/ACT nasal spray SPRAY 2 SPRAYS INTO EACH NOSTRIL EVERY DAY (Patient taking differently: Place 2 sprays into both nostrils daily. )   ipratropium (ATROVENT) 0.03 % nasal spray PLACE 2 SPRAYS INTO THE NOSE 4 (FOUR) TIMES DAILY. USE AS NEEDED FOR POST NASAL DRIP (Patient taking differently: Place 2 sprays into both nostrils daily. )   levocetirizine (XYZAL) 5 MG tablet TAKE 1 TABLET BY MOUTH EVERY DAY IN THE EVENING (Patient taking differently: Take 5 mg by mouth daily. )   levothyroxine (SYNTHROID) 125 MCG tablet Take 1 tablet (125 mcg total) by mouth daily. (Patient taking differently: Take 62.5 mcg by mouth daily. )   Polyvinyl Alcohol-Povidone (REFRESH OP) Place 1 drop into both eyes daily as needed (dry eyes).   simvastatin (ZOCOR) 20 MG tablet TAKE 1 TABLET BY MOUTH EVERYDAY AT BEDTIME   telmisartan (MICARDIS) 80 MG tablet Take 1 tablet (80 mg total) by mouth daily.   tiZANidine (ZANAFLEX) 4 MG tablet Take 1 tablet (4 mg total) by mouth every 8 (eight) hours as needed for muscle spasms.   No facility-administered encounter medications on file as of 12/13/2019.    Current Diagnosis: Patient Active Problem List   Diagnosis Date Noted   Osteopenia 01/09/2019   Pansinusitis 03/06/2018  Cough 03/06/2018   Orthostatic hypotension 12/06/2016   Viral URI with cough 04/20/2016   Sinus node dysfunction ([place]) 11/11/2015   Routine general medical examination at a health care facility 06/04/2015   Insomnia with sleep apnea 06/04/2015   Bronchitis, chronic obstructive w acute bronchitis ([place]) 12/10/2014   Lumbar disc disease 09/01/2010   Obstructive sleep apnea 05/31/2007   Hyperlipidemia with target LDL less than 130 02/05/2007   Depression with anxiety  02/05/2007   Allergic rhinitis 02/05/2007   Hypothyroidism 02/02/2007   Essential hypertension 02/02/2007    Goals Addressed   None    Reviewed chart prior to disease state call. Spoke with patient regarding BP  Recent Office Vitals: BP Readings from Last 3 Encounters:  10/24/19 104/64  08/08/19 98/70  08/02/19 96/68   Pulse Readings from Last 3 Encounters:  10/24/19 76  08/08/19 72  08/02/19 77    Wt Readings from Last 3 Encounters:  10/24/19 149 lb (67.6 kg)  08/08/19 151 lb (68.5 kg)  08/02/19 151 lb (68.5 kg)     Kidney Function Lab Results  Component Value Date/Time   CREATININE 0.84 08/01/2019 10:58 AM   CREATININE 0.87 10/22/2018 03:16 PM   CREATININE 0.70 11/04/2015 11:06 AM   CREATININE 0.69 09/27/2012 07:35 AM   GFR 63.34 10/22/2018 03:16 PM   GFRNONAA >60 08/01/2019 10:58 AM   GFRAA >60 08/01/2019 10:58 AM    BMP Latest Ref Rng & Units 08/01/2019 10/22/2018 06/08/2017  Glucose 70 - 99 mg/dL 92 94 84  BUN 8 - 23 mg/dL 29(H) 27(H) 16  Creatinine 0.44 - 1.00 mg/dL 0.84 0.87 0.75  Sodium 135 - 145 mmol/L 140 140 138  Potassium 3.5 - 5.1 mmol/L 4.0 3.9 3.6  Chloride 98 - 111 mmol/L 104 104 101  CO2 22 - 32 mmol/L 28 29 29   Calcium 8.9 - 10.3 mg/dL 9.3 9.4 9.2    Current antihypertensive regimen:  Amlodipine 5 mg daily Telmisartan 80 mg daily  How often are you checking your Blood Pressure? when feeling symptomatic. Patient states she use to check her BP regularly but stopped because her readings were always normal. She now only checks her BP when she feels a little lightheaded.  Current home BP readings: Patient did not have any readings at the time of the phone call.  What recent interventions/DTPs have been made by any provider to improve Blood Pressure control since last CPP Visit: none  Any recent hospitalizations or ED visits since last visit with CPP? No   What diet changes have been made to improve Blood Pressure Control?  None  What  exercise is being done to improve your Blood Pressure Control?  None  Patient wanted to know if her Synthroid dose could be adjusted due to her constant feeling of exhaustion. Informed the patient I would forward her inquiry to the PharmD and get let her know what can be done if anything. Reminded pt of appt on 2/15 at 10 am over the telephone.  Adherence Review: Is the patient currently on ACE/ARB medication? Yes Telmisartan 80mg  daily Does the patient have >5 day gap between last estimated fill dates? No    Follow-Up:  Pharmacist Review   Fanny Skates, [place] Pharmacist Assistant [phone]  Note pt's concern about exhaustion. Most recent TSH WNL. Pt will need to follow up with PCP for further assessment.  6 minutes spent in review, coordination, and documentation.   Reviewed by: De Blanch, PharmD, BCACP Clinical Pharmacist [facility]  at [place]

## 2019-12-17 DIAGNOSIS — Z96611 Presence of right artificial shoulder joint: Secondary | ICD-10-CM | POA: Diagnosis not present

## 2019-12-17 DIAGNOSIS — M19011 Primary osteoarthritis, right shoulder: Secondary | ICD-10-CM | POA: Diagnosis not present

## 2019-12-31 ENCOUNTER — Encounter: Payer: Self-pay | Admitting: Family Medicine

## 2020-01-03 NOTE — Progress Notes (Addendum)
[facility], [place], [place] [address] [phone]  Date:  01/06/2020   Name:  Theresa Mejia   DOB:  1940/10/27   MRN:  [id number]  PCP:  Darreld Mclean, MD    Chief Complaint: Heartburn (Taking Pepcid and tums, helps temporarily/)   History of Present Illness:  Theresa Mejia is a 79 y.o. very pleasant female patient who presents with the following:  Patient today with concern of heartburn and bloating- she notes that this is not new but is getting worse She notes heartburn that can lead to nausea and even vomiting; it has been worse for 6 months now She is using pepcid- she takes prior to a meal if she knows it may irritate her- pizza, etc- otherwise she uses as needed.  It does help She also used tums as needed  She may bring up just some mucus- not generally real vomiting Any pressure on her epigastric area is uncomfortable- she has to remove her bra when she has the sx  She has difficulty passing stool-  It may take her 20 minutes or so to go She does not feel like her stools are hard however No diarrhea No blood in her stools or vomitus No CP or abnormal SOB - she is having to get back into shape right now as she lost condition due to recent health issues and shoulder replacement.   She notes that she will get out of breath with exercise but this seems to be improving   Last seen by myself in October for physical- History of hyperlipidemia, HTN, hypothyroidism, OSA, pacemaker implanted in 2017 At last visit we increased her dose of wellbutrin some due to sx of depression She is now back on 150 BID and feels like this is working pretty well for her; she feels like she is overall doing better on this increased dose The last couple of years have been hard for her (covid stress) She also underwent a right total shoulder in July- she is really happy with how her shoulder has turned out   COVID  booster, flu shot, Shingrix all complete  Also, 2 days ago her hands felt very itchy between the fingers and on the palms She washed her hands in alcohol and this is now better  She also will have some head and nasal congestion- she thinks this might be allergies  She is using sudafed just on occasion She is also using xyzal on a regular basis She has noted night terrors- she may wake up during the night screaming but can generally not remember the dream that triggered this This has been going on for a year or so- this has not gotten better since we increased her wellbutrin   She did a stress test back in 2017 Patient Active Problem List   Diagnosis Date Noted  . Osteopenia 01/09/2019  . Pansinusitis 03/06/2018  . Cough 03/06/2018  . Orthostatic hypotension 12/06/2016  . Viral URI with cough 04/20/2016  . Sinus node dysfunction ([place]) 11/11/2015  . Routine general medical examination at a health care facility 06/04/2015  . Insomnia with sleep apnea 06/04/2015  . Bronchitis, chronic obstructive w acute bronchitis ([place]) 12/10/2014  . Lumbar disc disease 09/01/2010  . Obstructive sleep apnea 05/31/2007  . Hyperlipidemia with target LDL less than 130 02/05/2007  . Depression with anxiety 02/05/2007  . Allergic rhinitis 02/05/2007  . Hypothyroidism 02/02/2007  .  Essential hypertension 02/02/2007    Past Medical History:  Diagnosis Date  . Allergic rhinitis   . Depression   . DJD (degenerative joint disease)   . GERD (gastroesophageal reflux disease)   . Headache(784.0)    sinus  . Hypercholesteremia   . Hypersomnia   . Hypertension   . Hypothyroidism   . Internal hemorrhoid 11/2008   s/p banding ([place])  . Lumbar disc disease   . OBSTRUCTIVE SLEEP APNEA    NPSG 04/29/07- AHI 1.1/hr, RDI 21.2/hr. Weight 176 lbs. CPAP was tried based on the RDI.    [name] OVERACTIVE BLADDER    pt unaware  . Presence of permanent cardiac pacemaker 11/11/2015  . Reactive depression (situational)    . Sinus node dysfunction (HCC)   . Spondylosis, cervical     Past Surgical History:  Procedure Laterality Date  . BIOPSY BREAST    . BREAST EXCISIONAL BIOPSY Left 2011  . CATARACT EXTRACTION W/ INTRAOCULAR LENS IMPLANT Right   . COLONOSCOPY    . EP IMPLANTABLE DEVICE N/A 11/11/2015   Procedure: Pacemaker Implant;  Surgeon: Evans Lance, MD;  Location: [facility];  Service: Cardiovascular;  Laterality: N/A;  . eye lid lift    . INSERT / REPLACE / REMOVE PACEMAKER  11/11/2015  . KNEE ARTHROSCOPY     Dr. [name]  . MOUTH SURGERY  08/25/2017   implant fell out due to bone loss  . REVERSE SHOULDER ARTHROPLASTY Right 08/08/2019   Procedure: REVERSE SHOULDER ARTHROPLASTY;  Surgeon: Tania Ade, MD;  Location: [facility];  Service: Orthopedics;  Laterality: Right;  . rotator cuff surgery Right    Dr. [name]  . TUBAL LIGATION      Social History   Tobacco Use  . Smoking status: Former Smoker    Years: 8.00    Types: Cigarettes    Quit date: 01/10/1966    Years since quitting: 54.0  . Smokeless tobacco: Never Used  Vaping Use  . Vaping Use: Never used  Substance Use Topics  . Alcohol use: No  . Drug use: No    Family History  Problem Relation Age of Onset  . Crohn's disease Father 38  . Macular degeneration Mother   . Cancer Mother 48       bladder, never confirmed with biospy  . Hypothyroidism Mother   . Tremor Mother   . Stroke Mother 18  . Parkinsonism Maternal Grandmother   . Alzheimer's disease Paternal Aunt   . Alzheimer's disease Paternal Uncle   . Diabetes Paternal Grandmother   . Colon cancer Neg Hx   . Esophageal cancer Neg Hx   . Rectal cancer Neg Hx     Allergies  Allergen Reactions  . Ramipril     Unknown reaction  . Sulfa Antibiotics     Says it dropped her blood pressure really low.   . Sulfonamide Derivatives     REACTION: hypotension    Medication list has been reviewed and updated.  Current Outpatient Medications on File Prior to  Visit  Medication Sig Dispense Refill  . amLODipine (NORVASC) 5 MG tablet TAKE 1 TABLET BY MOUTH EVERY DAY 90 tablet 1  . buPROPion (WELLBUTRIN SR) 150 MG 12 hr tablet TAKE 1 TABLET BY MOUTH TWICE A DAY 180 tablet 1  . calcium carbonate (TUMS - DOSED IN MG ELEMENTAL CALCIUM) 500 MG chewable tablet Chew 1 tablet by mouth 4 (four) times daily as needed for indigestion or heartburn.    . Cholecalciferol (  VITAMIN D3) 50 MCG (2000 UT) TABS Take 2,000 Units by mouth every other day.     . clonazePAM (KLONOPIN) 0.5 MG tablet Take 1/2 at bedtime as needed for insomnia, may increase to a whole tablet if needed 30 tablet 0  . famotidine (PEPCID) 10 MG tablet Take 10 mg by mouth daily as needed for heartburn or indigestion.    . fluticasone (FLONASE) 50 MCG/ACT nasal spray SPRAY 2 SPRAYS INTO EACH NOSTRIL EVERY DAY (Patient taking differently: Place 2 sprays into both nostrils daily.) 48 mL 1  . ipratropium (ATROVENT) 0.03 % nasal spray PLACE 2 SPRAYS INTO THE NOSE 4 (FOUR) TIMES DAILY. USE AS NEEDED FOR POST NASAL DRIP (Patient taking differently: Place 2 sprays into both nostrils daily.) 30 mL 6  . levocetirizine (XYZAL) 5 MG tablet TAKE 1 TABLET BY MOUTH EVERY DAY IN THE EVENING (Patient taking differently: Take 5 mg by mouth daily.) 90 tablet 3  . levothyroxine (SYNTHROID) 125 MCG tablet Take 1 tablet (125 mcg total) by mouth daily. (Patient taking differently: Take 62.5 mcg by mouth daily.) 90 tablet 1  . Polyvinyl Alcohol-Povidone (REFRESH OP) Place 1 drop into both eyes daily as needed (dry eyes).    . simvastatin (ZOCOR) 20 MG tablet TAKE 1 TABLET BY MOUTH EVERYDAY AT BEDTIME 90 tablet 1  . telmisartan (MICARDIS) 80 MG tablet Take 1 tablet (80 mg total) by mouth daily. 90 tablet 1   No current facility-administered medications on file prior to visit.    Review of Systems:  As per HPI- otherwise negative.   Physical Examination: Vitals:   01/06/20 0928  BP: 140/88  Pulse: 77  Resp: 17   SpO2: 98%   Vitals:   01/06/20 0928  Weight: 151 lb (68.5 kg)  Height: 5' (1.524 m)   Body mass index is 29.49 kg/m. Ideal Body Weight: Weight in (lb) to have BMI = 25: 127.7  GEN: no acute distress.  Overweight, looks well HEENT: Atraumatic, Normocephalic.  Ears and Nose: No external deformity. CV: RRR, No M/G/R. No JVD. No thrill. No extra heart sounds. PULM: CTA B, no wheezes, crackles, rhonchi. No retractions. No resp. distress. No accessory muscle use. ABD: S, NT, ND, +BS. No rebound. No HSM.  She has epigastric and BUQ tenderness on exam  EXTR: No c/c/e PSYCH: Normally interactive. Conversant.   EKG:  Sinus rhythm with possible old infarct  Compared with prior tracings back to 2017 no change noted   Assessment and Plan: Epigastric pain - Plan: CBC, Comprehensive metabolic panel, EKG 12-Lead, US Abdomen Limited RUQ (LIVER/GB), sucralfate (CARAFATE) 1 g tablet, omeprazole (PRILOSEC) 40 MG capsule, Troponin I (High Sensitivity), Lipase, H. pylori breath test, CANCELED: H. pylori breath test  Pt here today with sx suggestive of gastritis or GERD, ulcer Will start on PPI, carafate Labs pending as above H pylori breath test pending RUQ [name] to rule out gallstones Pancreatic enzymes penidng as well  Will include troponin due to SOB with exercise- likely due to deconditioning. No EKG change noted  This visit occurred during the SARS-CoV-2 public health emergency.  Safety protocols were in place, including screening questions prior to the visit, additional usage of staff PPE, and extensive cleaning of exam room while observing appropriate contact time as indicated for disinfecting solutions.    Signed Abbe Amsterdam, MD  Received her labs as below, message to patient  Results for orders placed or performed in visit on 01/06/20  CBC  Result Value Ref Range  WBC 5.3 4.0 - 10.5 K/uL   RBC 4.46 3.87 - 5.11 Mil/uL   Platelets 248.0 150.0 - 400.0 K/uL   Hemoglobin 13.1 12.0 -  15.0 g/dL   HCT 39.3 36.0 - 46.0 %   MCV 88.2 78.0 - 100.0 fl   MCHC 33.4 30.0 - 36.0 g/dL   RDW 13.5 11.5 - 15.5 %  Comprehensive metabolic panel  Result Value Ref Range   Sodium 141 135 - 145 mEq/L   Potassium 3.9 3.5 - 5.1 mEq/L   Chloride 105 96 - 112 mEq/L   CO2 29 19 - 32 mEq/L   Glucose, Bld 92 70 - 99 mg/dL   BUN 23 6 - 23 mg/dL   Creatinine, Ser 0.77 0.40 - 1.20 mg/dL   Total Bilirubin 0.6 0.2 - 1.2 mg/dL   Alkaline Phosphatase 42 39 - 117 U/L   AST 14 0 - 37 U/L   ALT 12 0 - 35 U/L   Total Protein 7.0 6.0 - 8.3 g/dL   Albumin 4.6 3.5 - 5.2 g/dL   GFR 74.61 >60.00 mL/min   Calcium 8.8 8.4 - 10.5 mg/dL  Lipase  Result Value Ref Range   Lipase 39.0 11.0 - 59.0 U/L  Troponin I (High Sensitivity)  Result Value Ref Range   High Sens Troponin I 3 2 - 17 ng/L

## 2020-01-06 ENCOUNTER — Encounter: Payer: Self-pay | Admitting: Family Medicine

## 2020-01-06 ENCOUNTER — Other Ambulatory Visit: Payer: Self-pay

## 2020-01-06 ENCOUNTER — Ambulatory Visit (INDEPENDENT_AMBULATORY_CARE_PROVIDER_SITE_OTHER): Payer: PPO | Admitting: Family Medicine

## 2020-01-06 VITALS — BP 140/88 | HR 77 | Resp 17 | Ht 60.0 in | Wt 151.0 lb

## 2020-01-06 DIAGNOSIS — R1013 Epigastric pain: Secondary | ICD-10-CM

## 2020-01-06 LAB — CBC
HCT: 39.3 % (ref 36.0–46.0)
Hemoglobin: 13.1 g/dL (ref 12.0–15.0)
MCHC: 33.4 g/dL (ref 30.0–36.0)
MCV: 88.2 fl (ref 78.0–100.0)
Platelets: 248 10*3/uL (ref 150.0–400.0)
RBC: 4.46 Mil/uL (ref 3.87–5.11)
RDW: 13.5 % (ref 11.5–15.5)
WBC: 5.3 10*3/uL (ref 4.0–10.5)

## 2020-01-06 LAB — COMPREHENSIVE METABOLIC PANEL
ALT: 12 U/L (ref 0–35)
AST: 14 U/L (ref 0–37)
Albumin: 4.6 g/dL (ref 3.5–5.2)
Alkaline Phosphatase: 42 U/L (ref 39–117)
BUN: 23 mg/dL (ref 6–23)
CO2: 29 mEq/L (ref 19–32)
Calcium: 8.8 mg/dL (ref 8.4–10.5)
Chloride: 105 mEq/L (ref 96–112)
Creatinine, Ser: 0.77 mg/dL (ref 0.40–1.20)
GFR: 74.61 mL/min (ref >60.00)
Glucose, Bld: 92 mg/dL (ref 70–99)
Potassium: 3.9 mEq/L (ref 3.5–5.1)
Sodium: 141 mEq/L (ref 135–145)
Total Bilirubin: 0.6 mg/dL (ref 0.2–1.2)
Total Protein: 7 g/dL (ref 6.0–8.3)

## 2020-01-06 LAB — TROPONIN I (HIGH SENSITIVITY): High Sens Troponin I: 3 ng/L (ref 2–17)

## 2020-01-06 LAB — LIPASE: Lipase: 39 U/L (ref 11.0–59.0)

## 2020-01-06 MED ORDER — SUCRALFATE 1 G PO TABS: 1.0000 g | ORAL_TABLET | Freq: Three times a day (TID) | ORAL | 0 refills | Status: DC

## 2020-01-06 MED ORDER — OMEPRAZOLE 40 MG PO CPDR: 40.0000 mg | DELAYED_RELEASE_CAPSULE | Freq: Every day | ORAL | 1 refills | Status: DC

## 2020-01-06 NOTE — Patient Instructions (Signed)
Good to see you again today!  I will be in touch with your labs and we will get you set up for a gallbladder US Please let me know if you don't hear about your ultrasound by the end of the week In the meantime we will use a PPI (acid reducer) daily for 1-2 months caraftate with meals/ bedtime for 4 days Please let me know if getting at all worse

## 2020-01-08 ENCOUNTER — Other Ambulatory Visit: Payer: Self-pay

## 2020-01-08 ENCOUNTER — Other Ambulatory Visit (INDEPENDENT_AMBULATORY_CARE_PROVIDER_SITE_OTHER): Payer: PPO

## 2020-01-08 DIAGNOSIS — R1013 Epigastric pain: Secondary | ICD-10-CM

## 2020-01-09 LAB — H. PYLORI BREATH TEST: H. pylori Breath Test: NOT DETECTED

## 2020-01-15 ENCOUNTER — Encounter: Payer: Self-pay | Admitting: Family Medicine

## 2020-01-15 DIAGNOSIS — R0982 Postnasal drip: Secondary | ICD-10-CM

## 2020-01-15 MED ORDER — IPRATROPIUM BROMIDE 0.03 % NA SOLN: NASAL | 6 refills | Status: DC

## 2020-01-22 ENCOUNTER — Encounter: Payer: Self-pay | Admitting: Family Medicine

## 2020-01-22 ENCOUNTER — Telehealth: Payer: PPO

## 2020-01-24 ENCOUNTER — Other Ambulatory Visit: Payer: PPO

## 2020-01-29 DIAGNOSIS — Z471 Aftercare following joint replacement surgery: Secondary | ICD-10-CM | POA: Diagnosis not present

## 2020-01-29 DIAGNOSIS — Z96611 Presence of right artificial shoulder joint: Secondary | ICD-10-CM | POA: Diagnosis not present

## 2020-02-11 ENCOUNTER — Other Ambulatory Visit: Payer: Self-pay | Admitting: Family Medicine

## 2020-02-19 ENCOUNTER — Ambulatory Visit (INDEPENDENT_AMBULATORY_CARE_PROVIDER_SITE_OTHER): Payer: PPO

## 2020-02-19 DIAGNOSIS — I495 Sick sinus syndrome: Secondary | ICD-10-CM | POA: Diagnosis not present

## 2020-02-21 LAB — CUP PACEART REMOTE DEVICE CHECK
Date Time Interrogation Session: 20220208115046
Implantable Lead Implant Date: 20171101
Implantable Lead Implant Date: 20171101
Implantable Lead Location: 753859
Implantable Lead Location: 753860
Implantable Lead Model: 377
Implantable Lead Model: 377
Implantable Lead Serial Number: 49553678
Implantable Lead Serial Number: 49617393
Implantable Pulse Generator Implant Date: 20171101
Pulse Gen Model: 394969
Pulse Gen Serial Number: 68817609

## 2020-02-21 NOTE — Progress Notes (Deleted)
Chronic Care Management Pharmacy Note  02/21/2020 Name:  Theresa Mejia MRN:  [id number] DOB:  [birth year]  Subjective: Theresa Mejia is an 80 y.o. year old female who is a primary patient of Copland, Gay Filler, MD.  The CCM team was consulted for assistance with disease management and care coordination needs.    Engaged with patient by telephone for follow up visit in response to provider referral for pharmacy case management and/or care coordination services.   Consent to Services:  The patient was given the following information about Chronic Care Management services today, agreed to services, and gave verbal consent: 1. CCM service includes personalized support from designated clinical staff supervised by the primary care provider, including individualized plan of care and coordination with other care providers 2. 24/7 contact phone numbers for assistance for urgent and routine care needs. 3. Service will only be billed when office clinical staff spend 20 minutes or more in a month to coordinate care. 4. Only one practitioner may furnish and bill the service in a calendar month. 5.The patient may stop CCM services at any time (effective at the end of the month) by phone call to the office staff. 6. The patient will be responsible for cost sharing (co-pay) of up to 20% of the service fee (after annual deductible is met). Patient agreed to services and consent obtained.  Patient Care Team: Copland, Gay Filler, MD as PCP - General (Family Medicine) Deneise Lever, MD (Pulmonary Disease) Gaynelle Arabian, MD (Orthopedic Surgery) Richmond Campbell, MD (Gastroenterology) Day, Melvenia Beam, [facility] as Pharmacist (Pharmacist)  Recent office visits: 01/06/2020 (Copland) - heartburn and bloating are worsening, started on PPI and carafate, H. Pylori breath test ordered  10/24/19 (Copland) - she had do d/c Fosamax due to GERD, interested in starting Prolia to replace.  Recent consult visits: None  recent  Hospital visits: None in previous 6 months  Objective:  Lab Results  Component Value Date   CREATININE 0.77 01/06/2020   BUN 23 01/06/2020   GFR 74.61 01/06/2020   GFRNONAA >60 08/01/2019   GFRAA >60 08/01/2019   NA 141 01/06/2020   K 3.9 01/06/2020   CALCIUM 8.8 01/06/2020   CO2 29 01/06/2020    Lab Results  Component Value Date/Time   HGBA1C 5.3 10/24/2019 10:20 AM   HGBA1C 5.6 10/22/2018 03:16 PM   GFR 74.61 01/06/2020 10:54 AM   GFR 63.34 10/22/2018 03:16 PM    Last diabetic Eye exam: No results found for: HMDIABEYEEXA  Last diabetic Foot exam: No results found for: HMDIABFOOTEX   Lab Results  Component Value Date   CHOL 126 10/24/2019   HDL 37 (L) 10/24/2019   LDLCALC 72 10/24/2019   LDLDIRECT 148.3 09/01/2010   TRIG 91 10/24/2019   CHOLHDL 3.4 10/24/2019    Hepatic Function Latest Ref Rng & Units 01/06/2020 08/01/2019 10/22/2018  Total Protein 6.0 - 8.3 g/dL 7.0 7.2 6.9  Albumin 3.5 - 5.2 g/dL 4.6 4.4 4.4  AST 0 - 37 U/L 14 18 15   ALT 0 - 35 U/L 12 17 12   Alk Phosphatase 39 - 117 U/L 42 36(L) 57  Total Bilirubin 0.2 - 1.2 mg/dL 0.6 0.7 0.4  Bilirubin, Direct 0.0 - 0.3 mg/dL - - -    Lab Results  Component Value Date/Time   TSH 1.55 10/24/2019 10:20 AM   TSH 1.97 10/22/2018 03:16 PM   FREET4 1.06 06/04/2015 02:43 PM    CBC Latest Ref Rng & Units 01/06/2020 08/01/2019 10/22/2018  WBC 4.0 - 10.5 K/uL 5.3 5.1 5.2  Hemoglobin 12.0 - 15.0 g/dL 13.1 13.4 12.3  Hematocrit 36.0 - 46.0 % 39.3 40.5 36.9  Platelets 150.0 - 400.0 K/uL 248.0 242 282.0    Lab Results  Component Value Date/Time   VD25OH 69.71 11/03/2016 11:04 AM   VD25OH 80 07/28/2009 10:23 PM   VD25OH 38 02/25/2008 08:37 PM    Clinical ASCVD: {YES/NO:21197} The ASCVD Risk score Mikey Bussing DC Jr., et al., 2013) failed to calculate for the following reasons:   The valid total cholesterol range is 130 to 320 mg/dL    Depression screen [facility] 2/9 08/22/2019 11/19/2018 11/14/2017  Decreased  Interest 1 0 3  Down, Depressed, Hopeless 0 0 3  PHQ - 2 Score 1 0 6  Altered sleeping 2 - 3  Tired, decreased energy 2 - 3  Change in appetite 0 - 0  Feeling bad or failure about yourself  0 - 3  Trouble concentrating 0 - 3  Moving slowly or fidgety/restless 0 - 3  Suicidal thoughts 0 - 0  PHQ-9 Score 5 - 21  Difficult doing work/chores - - Somewhat difficult  Some recent data might be hidden     ***Other: (CHADS2VASc if Afib, MMRC or CAT for COPD, ACT, DEXA)  Social History   Tobacco Use  Smoking Status Former Smoker  . Years: 8.00  . Types: Cigarettes  . Quit date: 01/10/1966  . Years since quitting: 54.1  Smokeless Tobacco Never Used   BP Readings from Last 3 Encounters:  01/06/20 140/88  10/24/19 104/64  08/08/19 98/70   Pulse Readings from Last 3 Encounters:  01/06/20 77  10/24/19 76  08/08/19 72   Wt Readings from Last 3 Encounters:  01/06/20 151 lb (68.5 kg)  10/24/19 149 lb (67.6 kg)  08/08/19 151 lb (68.5 kg)    Assessment/Interventions: Review of patient past medical history, allergies, medications, health status, including review of consultants reports, laboratory and other test data, was performed as part of comprehensive evaluation and provision of chronic care management services.   SDOH:  (Social Determinants of Health) assessments and interventions performed: {yes/no:20286}   CCM Care Plan  Allergies  Allergen Reactions  . Ramipril     Unknown reaction  . Sulfa Antibiotics     Says it dropped her blood pressure really low.   . Sulfonamide Derivatives     REACTION: hypotension    Medications Reviewed Today    Reviewed by Darreld Mclean, MD (Physician) on 01/06/20 at 0934  Med List Status: <None>  Medication Order Taking? Sig Documenting Provider Last Dose Status Informant  amLODipine (NORVASC) 5 MG tablet [id number] Yes TAKE 1 TABLET BY MOUTH EVERY DAY Copland, Gay Filler, MD Taking Active   buPROPion (WELLBUTRIN SR) 150 MG 12 hr tablet  [id number] Yes TAKE 1 TABLET BY MOUTH TWICE A DAY Copland, Gay Filler, MD Taking Active   calcium carbonate (TUMS - DOSED IN MG ELEMENTAL CALCIUM) 500 MG chewable tablet [id number] Yes Chew 1 tablet by mouth 4 (four) times daily as needed for indigestion or heartburn. [provider] Taking Active Self  Cholecalciferol (VITAMIN D3) 50 MCG (2000 UT) TABS [id number] Yes Take 2,000 Units by mouth every other day.  [provider] Taking Active Self  clonazePAM (KLONOPIN) 0.5 MG tablet [id number] Yes Take 1/2 at bedtime as needed for insomnia, may increase to a whole tablet if needed Copland, Gay Filler, MD Taking Active   famotidine (PEPCID) 10 MG tablet [id number] Yes Take  10 mg by mouth daily as needed for heartburn or indigestion. [provider] Taking Active Self  fluticasone (FLONASE) 50 MCG/ACT nasal spray [id number] Yes SPRAY 2 SPRAYS INTO EACH NOSTRIL EVERY DAY  Patient taking differently: Place 2 sprays into both nostrils daily.   Copland, Gay Filler, MD Taking Active Self  ipratropium (ATROVENT) 0.03 % nasal spray [id number] Yes PLACE 2 SPRAYS INTO THE NOSE 4 (FOUR) TIMES DAILY. USE AS NEEDED FOR POST NASAL DRIP  Patient taking differently: Place 2 sprays into both nostrils daily.   Copland, Gay Filler, MD Taking Active Self  levocetirizine (XYZAL) 5 MG tablet [id number] Yes TAKE 1 TABLET BY MOUTH EVERY DAY IN THE EVENING  Patient taking differently: Take 5 mg by mouth daily.   Copland, Gay Filler, MD Taking Active Self  levothyroxine (SYNTHROID) 125 MCG tablet [id number] Yes Take 1 tablet (125 mcg total) by mouth daily.  Patient taking differently: Take 62.5 mcg by mouth daily.   Copland, Gay Filler, MD Taking Active Self  Polyvinyl Alcohol-Povidone (REFRESH OP) [id number] Yes Place 1 drop into both eyes daily as needed (dry eyes). [provider] Taking Active Self  simvastatin (ZOCOR) 20 MG tablet [id number] Yes TAKE 1 TABLET BY MOUTH EVERYDAY AT BEDTIME Copland,  Gay Filler, MD Taking Active   telmisartan (MICARDIS) 80 MG tablet [id number] Yes Take 1 tablet (80 mg total) by mouth daily. Copland, Gay Filler, MD Taking Active           Patient Active Problem List   Diagnosis Date Noted  . Osteopenia 01/09/2019  . Pansinusitis 03/06/2018  . Cough 03/06/2018  . Orthostatic hypotension 12/06/2016  . Viral URI with cough 04/20/2016  . Sinus node dysfunction ([place]) 11/11/2015  . Routine general medical examination at a health care facility 06/04/2015  . Insomnia with sleep apnea 06/04/2015  . Bronchitis, chronic obstructive w acute bronchitis ([place]) 12/10/2014  . Lumbar disc disease 09/01/2010  . Obstructive sleep apnea 05/31/2007  . Hyperlipidemia with target LDL less than 130 02/05/2007  . Depression with anxiety 02/05/2007  . Allergic rhinitis 02/05/2007  . Hypothyroidism 02/02/2007  . Essential hypertension 02/02/2007    Immunization History  Administered Date(s) Administered  . Influenza Split 10/11/2010, 10/04/2013  . Influenza Whole 10/13/2009  . Influenza, High Dose Seasonal PF 10/14/2016, 10/22/2017, 10/22/2018  . Influenza-Unspecified 10/28/2015, 10/17/2019  . PFIZER(Purple Top)SARS-COV-2 Vaccination 01/24/2019, 02/11/2019, 10/03/2019  . Pneumococcal Conjugate-13 11/06/2014  . Pneumococcal Polysaccharide-23 02/25/2008  . Td 02/25/2008  . Tdap 10/23/2018  . Zoster Recombinat (Shingrix) 04/27/2017, 08/01/2017    Conditions to be addressed/monitored:  HTN, HLD, Depression and anxiety, Allergic Rhinitis, Hypothyroidism, GERD, Vitamin B12 Deficiency, Vitamin D Deficiency, Osteopenia  There are no care plans that you recently modified to display for this patient.    Medication Assistance: {MEDASSISTANCEINFO:25044}  Patient's preferred pharmacy is:  [facility], [place], [place] [address] [place] [address] Phone: [phone] Fax: [phone]  [facility] [place], [place] - [address] [place] [address] Phone: [phone] Fax: [phone]  [id number] [address]: [phone]: [phone] Uses pill box? {Yes or If no, why not?:20788} Pt endorses ***% compliance  We discussed: {Pharmacy options:24294} Patient decided to: {US Pharmacy POJJK:09381} Care Plan and Follow Up Patient Decision:  {FOLLOWUP:24991}  Plan: {CM FOLLOW UP PWEXH:37169} CBeverly Milch PharmD Clinical Pharmacist  [place] [phone]  Current Barriers:  . {pharmacybarriers:24917} . ***  Pharmacist Clinical Goal(s):  [name] Over the next *** days, patient will {PHARMACYGOALCHOICES:24921} through collaboration with PharmD and provider.  . ***  Interventions: . 1:1 collaboration with Copland, Gay Filler, MD regarding development and update of comprehensive plan of care as evidenced by provider attestation and co-signature . Inter-disciplinary care team collaboration (see longitudinal plan of care) . Comprehensive medication review performed; medication list updated in electronic medical record  Hypertension (BP goal {CHL HP UPSTREAM Pharmacist BP ranges:[phone]}) -{CHL Controlled/Uncontrolled:[phone]} -Current treatment: . Amlodipine 12m daily . Telmisartan 828m-Medications previously tried: ***  -Current home readings: *** -Current dietary habits: *** -Current exercise habits: *** -{ACTIONS;DENIES/REPORTS:21021675::"Denies"} hypotensive/hypertensive symptoms -Educated on {CCM BP Counseling:25124} -Counseled to monitor BP at home ***, document, and provide log at future appointments -{CCMPHARMDINTERVENTION:25122}  Hyperlipidemia: (LDL goal < ***) -{CHL Controlled/Uncontrolled:270-195-1816} -Current treatment: . Simvastatin 2020mMedications previously tried: ***  -Current dietary patterns: *** -Current  exercise habits: *** -Educated on {CCM HLD Counseling:25126} -{CCMPHARMDINTERVENTION:25122}  Osteoporosis  (Goal ***) -{CHL Controlled/Uncontrolled:270-195-1816} -Last DEXA Scan: ***   T-Score femoral neck: ***  T-Score total hip: ***  T-Score lumbar spine: ***  T-Score forearm radius: ***  10-year probability of major osteoporotic fracture: ***  10-year probability of hip fracture: *** -Patient is a candidate for pharmacologic treatment due to T-Score -1.0 to -2.5 and 10-year risk of major osteoporotic fracture > 20% and T-Score -1.0 to -2.5 and 10-year risk of hip fracture > 3% -Current treatment  . Prolia?? -Medications previously tried: ***  -{Osteoporosis Counseling:23892} -{CCMPHARMDINTERVENTION:25122}  GERD (Goal: ***) -{CHL Controlled/Uncontrolled:[phone]} -Current treatment  . Omeprazole 63m12mily . Famotidine 10mg60marafate 1g -Medications previously tried: ***  -{CCMPHARMDINTERVENTION:25122}   Patient Goals/Self-Care Activities . Over the next *** days, patient will:  - {pharmacypatientgoals:24919}  Follow Up Plan: {CM FOLLOW UP PLAN:QPRF:16384}

## 2020-02-24 ENCOUNTER — Telehealth: Payer: PPO

## 2020-02-25 ENCOUNTER — Telehealth: Payer: PPO

## 2020-02-25 NOTE — Patient Instructions (Incomplete)
Visit Information  Goals Addressed   None     {CCM PT PRINT INSTRUCTIONS:22237}  {USCCMFUPLAN:23560}  Theresa Mejia, [facility]

## 2020-02-25 NOTE — Progress Notes (Signed)
Remote pacemaker transmission.   

## 2020-03-01 ENCOUNTER — Other Ambulatory Visit: Payer: Self-pay | Admitting: Family Medicine

## 2020-03-01 DIAGNOSIS — R1013 Epigastric pain: Secondary | ICD-10-CM

## 2020-03-09 ENCOUNTER — Telehealth: Payer: Self-pay | Admitting: Pharmacist

## 2020-03-09 NOTE — Progress Notes (Signed)
    Chronic Care Management Pharmacy Assistant   Name: Theresa Mejia  MRN: [id number] DOB: 13-Nov-1940  Reason for Encounter: Adherence Review  PCP : Darreld Mclean, MD   Verified Adherence Gap Information. Per insurance data, the patient is 100% compliant with CHOL (Simvastatin) and HTN (Telmisartan) medication. Per insurance data patient has met their wellness bundle screening but has not met their annual wellness visits. Their most recent A1C 5.3 on 10/24/19 and their most recent blood pressure 104/64 on 10/24/19. The patient met goal with keeping their blood pressure below 140/90.The patients total gaps-all measures is equal to 1.  Follow-Up:  Pharmacist Review   Charlann Lange, [place] Pharmacist Assistant [phone]

## 2020-03-28 ENCOUNTER — Encounter: Payer: Self-pay | Admitting: Family Medicine

## 2020-04-06 ENCOUNTER — Other Ambulatory Visit: Payer: Self-pay

## 2020-04-06 ENCOUNTER — Telehealth (INDEPENDENT_AMBULATORY_CARE_PROVIDER_SITE_OTHER): Payer: PPO | Admitting: Family Medicine

## 2020-04-06 DIAGNOSIS — Z20822 Contact with and (suspected) exposure to covid-19: Secondary | ICD-10-CM | POA: Diagnosis not present

## 2020-04-06 NOTE — Progress Notes (Signed)
[place] at [facility] [address], [place], [place] [address] [phone]  Date:  04/06/2020   Name:  Theresa Mejia   DOB:  06/02/1940   MRN:  [id number]  PCP:  Darreld Mclean, MD    Chief Complaint: No chief complaint on file.   History of Present Illness:  Theresa Mejia is a 80 y.o. very pleasant female patient who presents with the following:  Virtual visit today with concern of possible covid 5 Patient location is home, my location is office.  Patient identity confirmed with 2 factors, she gives consent for virtual visit today.  The patient, myself and her husband are present on the call  Last seen by myself in December - History of hyperlipidemia, HTN, hypothyroidism, OSA, pacemaker implanted in 2017  She is covid vaccinated including booster dose   Her husband tested positive for covid 48 hours ago-she took a test at the same time and was negative.  However, her symptoms began later Her sx started the last 25- 36 hours She has a mild cough  No fever noted  She is mostly worried about her husband and thinks this is increasing her BP  No SOB or respiratory distress Her pulse and O2 sats are normal today  Patient Active Problem List   Diagnosis Date Noted  . Osteopenia 01/09/2019  . Pansinusitis 03/06/2018  . Cough 03/06/2018  . Orthostatic hypotension 12/06/2016  . Viral URI with cough 04/20/2016  . Sinus node dysfunction ([place]) 11/11/2015  . Routine general medical examination at a health care facility 06/04/2015  . Insomnia with sleep apnea 06/04/2015  . Bronchitis, chronic obstructive w acute bronchitis ([place]) 12/10/2014  . Lumbar disc disease 09/01/2010  . Obstructive sleep apnea 05/31/2007  . Hyperlipidemia with target LDL less than 130 02/05/2007  . Depression with anxiety 02/05/2007  . Allergic rhinitis 02/05/2007  . Hypothyroidism 02/02/2007  . Essential hypertension 02/02/2007    Past Medical  History:  Diagnosis Date  . Allergic rhinitis   . Depression   . DJD (degenerative joint disease)   . GERD (gastroesophageal reflux disease)   . Headache(784.0)    sinus  . Hypercholesteremia   . Hypersomnia   . Hypertension   . Hypothyroidism   . Internal hemorrhoid 11/2008   s/p banding ([place])  . Lumbar disc disease   . OBSTRUCTIVE SLEEP APNEA    NPSG 04/29/07- AHI 1.1/hr, RDI 21.2/hr. Weight 176 lbs. CPAP was tried based on the RDI.    [name] OVERACTIVE BLADDER    pt unaware  . Presence of permanent cardiac pacemaker 11/11/2015  . Reactive depression (situational)   . Sinus node dysfunction (HCC)   . Spondylosis, cervical     Past Surgical History:  Procedure Laterality Date  . BIOPSY BREAST    . BREAST EXCISIONAL BIOPSY Left 2011  . CATARACT EXTRACTION W/ INTRAOCULAR LENS IMPLANT Right   . COLONOSCOPY    . EP IMPLANTABLE DEVICE N/A 11/11/2015   Procedure: Pacemaker Implant;  Surgeon: Evans Lance, MD;  Location: [facility];  Service: Cardiovascular;  Laterality: N/A;  . eye lid lift    . INSERT / REPLACE / REMOVE PACEMAKER  11/11/2015  . KNEE ARTHROSCOPY     Dr. [name]  . MOUTH SURGERY  08/25/2017   implant fell out due to bone loss  . REVERSE SHOULDER ARTHROPLASTY Right 08/08/2019   Procedure: REVERSE SHOULDER ARTHROPLASTY;  Surgeon: Tania Ade, MD;  Location: [facility];  Service: Orthopedics;  Laterality: Right;  . rotator cuff surgery Right    Dr. [name]  . TUBAL LIGATION      Social History   Tobacco Use  . Smoking status: Former Smoker    Years: 8.00    Types: Cigarettes    Quit date: 01/10/1966    Years since quitting: 54.2  . Smokeless tobacco: Never Used  Vaping Use  . Vaping Use: Never used  Substance Use Topics  . Alcohol use: No  . Drug use: No    Family History  Problem Relation Age of Onset  . Crohn's disease Father 67  . Macular degeneration Mother   . Cancer Mother 32       bladder, never confirmed with biospy  .  Hypothyroidism Mother   . Tremor Mother   . Stroke Mother 49  . Parkinsonism Maternal Grandmother   . Alzheimer's disease Paternal Aunt   . Alzheimer's disease Paternal Uncle   . Diabetes Paternal Grandmother   . Colon cancer Neg Hx   . Esophageal cancer Neg Hx   . Rectal cancer Neg Hx     Allergies  Allergen Reactions  . Ramipril     Unknown reaction  . Sulfa Antibiotics     Says it dropped her blood pressure really low.   . Sulfonamide Derivatives     REACTION: hypotension    Medication list has been reviewed and updated.  Current Outpatient Medications on File Prior to Visit  Medication Sig Dispense Refill  . amLODipine (NORVASC) 5 MG tablet TAKE 1 TABLET BY MOUTH EVERY DAY 90 tablet 1  . buPROPion (WELLBUTRIN SR) 150 MG 12 hr tablet TAKE 1 TABLET BY MOUTH TWICE A DAY 180 tablet 1  . calcium carbonate (TUMS - DOSED IN MG ELEMENTAL CALCIUM) 500 MG chewable tablet Chew 1 tablet by mouth 4 (four) times daily as needed for indigestion or heartburn.    . Cholecalciferol (VITAMIN D3) 50 MCG (2000 UT) TABS Take 2,000 Units by mouth every other day.     . clonazePAM (KLONOPIN) 0.5 MG tablet Take 1/2 at bedtime as needed for insomnia, may increase to a whole tablet if needed 30 tablet 0  . famotidine (PEPCID) 10 MG tablet Take 10 mg by mouth daily as needed for heartburn or indigestion.    . fluticasone (FLONASE) 50 MCG/ACT nasal spray Place 2 sprays into both nostrils daily. 48 mL 3  . ipratropium (ATROVENT) 0.03 % nasal spray PLACE 2 SPRAYS INTO THE NOSE 4 (FOUR) TIMES DAILY. USE AS NEEDED FOR POST NASAL DRIP 30 mL 6  . levocetirizine (XYZAL) 5 MG tablet TAKE 1 TABLET BY MOUTH EVERY DAY IN THE EVENING (Patient taking differently: Take 5 mg by mouth daily.) 90 tablet 3  . levothyroxine (SYNTHROID) 125 MCG tablet Take 1 tablet (125 mcg total) by mouth daily. (Patient taking differently: Take 62.5 mcg by mouth daily.) 90 tablet 1  . omeprazole (PRILOSEC) 40 MG capsule TAKE 1 CAPSULE (40  MG TOTAL) BY MOUTH DAILY. 30 capsule 1  . Polyvinyl Alcohol-Povidone (REFRESH OP) Place 1 drop into both eyes daily as needed (dry eyes).    . simvastatin (ZOCOR) 20 MG tablet TAKE 1 TABLET BY MOUTH EVERYDAY AT BEDTIME 90 tablet 1  . sucralfate (CARAFATE) 1 g tablet Take 1 tablet (1 g total) by mouth 4 (four) times daily -  with meals and at bedtime. 40 tablet 0  . telmisartan (MICARDIS) 80 MG tablet Take 1 tablet (80  mg total) by mouth daily. 90 tablet 1   No current facility-administered medications on file prior to visit.    Review of Systems:  As per HPI- otherwise negative.   Physical Examination: There were no vitals filed for this visit. There were no vitals filed for this visit. There is no height or weight on file to calculate BMI. Ideal Body Weight:   Patient was ordered a video monitor.  She looks well, no distress is noted.  She seems to be breathing normally 96%, 87 pulse  Assessment and Plan: Exposure to COVID-19 virus  Patient seen virtually today with concern of possible COVID-19.  Her husband has tested positive for same.  She tested herself and was negative, but this is before her symptoms began.  At this time her symptoms are relatively minor.  I have encouraged her to test again, if she is positive we can consider an oral antiviral as she does have some other health concerns.  She plans to be tested and will let me know the results.  She will seek care if oxygen saturations going below 90%, or if any distress Video used for duration of visit today  Signed Lamar Blinks, MD

## 2020-04-10 ENCOUNTER — Encounter: Payer: Self-pay | Admitting: Family Medicine

## 2020-04-18 ENCOUNTER — Other Ambulatory Visit: Payer: Self-pay | Admitting: Family Medicine

## 2020-04-18 DIAGNOSIS — E785 Hyperlipidemia, unspecified: Secondary | ICD-10-CM

## 2020-04-20 ENCOUNTER — Encounter: Payer: Self-pay | Admitting: Family Medicine

## 2020-04-22 NOTE — Progress Notes (Addendum)
[place] at [facility] [address], [place], [place] [address] [phone]  Date:  04/23/2020   Name:  Theresa Mejia   DOB:  March 20, 1940   MRN:  [id number]  PCP:  Darreld Mclean, MD    Chief Complaint: Follow-up and Neck Concern (Pulsating sensation in right side of neck, no pain, happened twice-one week. Husband has hx of aortic dissection. )   History of Present Illness:  Theresa Mejia is a 80 y.o. very pleasant female patient who presents with the following:  Following up today- last seen by myself about 2 weeks ago virtually after a covid 19 exposure - her husband was ill but she did not get covid as far as we know  History of OSA, hypothyroidism, hyperlipidemia, HTN, pacemaker implanted 2017 She contacted me on 4/11 with the following concern: Last week I have had several instances of a deep throbbing in the area of my right corrotid artery. This occurred while at rest. Is this something we should look into now or can it wait until my next physical?  [name] says this was a symptom of his prior to his major heart issues.  Pt notes this episode one week ago. While at rest she felt a "deep pulsating" feeling in her right neck It lasted a couple of minutes- then recurred again once later that evening  NOT painful Did not radiate down her arm It has not occurred since but has occurred in the past a few times Her husband was really worried- this is like the sx he had with aortic aneurysm  No slurred speech or facial drooping, no chest pain or other associated symptoms  Always tends to be on the same side  No vision change  She has noted some mild hand tremor which is worse in the am This is present in both hands but worse left She notes that tremor runs in her family, her mother had a similar tremor  She also feels tired and sometimes more emotional than previous -she wonders if her Wellbutrin is not working as well as  previously She may get "choked up" and upset easily She also notes that she may get night terrors/ bad dreams over the last year or so  Patient Active Problem List   Diagnosis Date Noted  . Osteopenia 01/09/2019  . Pansinusitis 03/06/2018  . Cough 03/06/2018  . Orthostatic hypotension 12/06/2016  . Viral URI with cough 04/20/2016  . Sinus node dysfunction ([place]) 11/11/2015  . Routine general medical examination at a health care facility 06/04/2015  . Insomnia with sleep apnea 06/04/2015  . Bronchitis, chronic obstructive w acute bronchitis ([place]) 12/10/2014  . Lumbar disc disease 09/01/2010  . Obstructive sleep apnea 05/31/2007  . Hyperlipidemia with target LDL less than 130 02/05/2007  . Depression with anxiety 02/05/2007  . Allergic rhinitis 02/05/2007  . Hypothyroidism 02/02/2007  . Essential hypertension 02/02/2007    Past Medical History:  Diagnosis Date  . Allergic rhinitis   . Depression   . DJD (degenerative joint disease)   . GERD (gastroesophageal reflux disease)   . Headache(784.0)    sinus  . Hypercholesteremia   . Hypersomnia   . Hypertension   . Hypothyroidism   . Internal hemorrhoid 11/2008   s/p banding ([place])  . Lumbar disc disease   . OBSTRUCTIVE SLEEP APNEA    NPSG 04/29/07- AHI 1.1/hr, RDI 21.2/hr. Weight 176 lbs. CPAP was tried based on the  RDI.    [name] OVERACTIVE BLADDER    pt unaware  . Presence of permanent cardiac pacemaker 11/11/2015  . Reactive depression (situational)   . Sinus node dysfunction (HCC)   . Spondylosis, cervical     Past Surgical History:  Procedure Laterality Date  . BIOPSY BREAST    . BREAST EXCISIONAL BIOPSY Left 2011  . CATARACT EXTRACTION W/ INTRAOCULAR LENS IMPLANT Right   . COLONOSCOPY    . EP IMPLANTABLE DEVICE N/A 11/11/2015   Procedure: Pacemaker Implant;  Surgeon: Evans Lance, MD;  Location: [facility];  Service: Cardiovascular;  Laterality: N/A;  . eye lid lift    . INSERT / REPLACE / REMOVE  PACEMAKER  11/11/2015  . KNEE ARTHROSCOPY     Dr. [name]  . MOUTH SURGERY  08/25/2017   implant fell out due to bone loss  . REVERSE SHOULDER ARTHROPLASTY Right 08/08/2019   Procedure: REVERSE SHOULDER ARTHROPLASTY;  Surgeon: Tania Ade, MD;  Location: [facility];  Service: Orthopedics;  Laterality: Right;  . rotator cuff surgery Right    Dr. [name]  . TUBAL LIGATION      Social History   Tobacco Use  . Smoking status: Former Smoker    Years: 8.00    Types: Cigarettes    Quit date: 01/10/1966    Years since quitting: 54.3  . Smokeless tobacco: Never Used  Vaping Use  . Vaping Use: Never used  Substance Use Topics  . Alcohol use: No  . Drug use: No    Family History  Problem Relation Age of Onset  . Crohn's disease Father 46  . Macular degeneration Mother   . Cancer Mother 52       bladder, never confirmed with biospy  . Hypothyroidism Mother   . Tremor Mother   . Stroke Mother 21  . Parkinsonism Maternal Grandmother   . Alzheimer's disease Paternal Aunt   . Alzheimer's disease Paternal Uncle   . Diabetes Paternal Grandmother   . Colon cancer Neg Hx   . Esophageal cancer Neg Hx   . Rectal cancer Neg Hx     Allergies  Allergen Reactions  . Ramipril     Unknown reaction  . Sulfa Antibiotics     Says it dropped her blood pressure really low.   . Sulfonamide Derivatives     REACTION: hypotension    Medication list has been reviewed and updated.  Current Outpatient Medications on File Prior to Visit  Medication Sig Dispense Refill  . amLODipine (NORVASC) 5 MG tablet TAKE 1 TABLET BY MOUTH EVERY DAY 90 tablet 1  . buPROPion (WELLBUTRIN SR) 150 MG 12 hr tablet TAKE 1 TABLET BY MOUTH TWICE A DAY 180 tablet 1  . calcium carbonate (TUMS - DOSED IN MG ELEMENTAL CALCIUM) 500 MG chewable tablet Chew 1 tablet by mouth 4 (four) times daily as needed for indigestion or heartburn.    . Cholecalciferol (VITAMIN D3) 50 MCG (2000 UT) TABS Take 2,000 Units by mouth every other  day.     . clonazePAM (KLONOPIN) 0.5 MG tablet Take 1/2 at bedtime as needed for insomnia, may increase to a whole tablet if needed 30 tablet 0  . famotidine (PEPCID) 10 MG tablet Take 10 mg by mouth daily as needed for heartburn or indigestion.    . fluticasone (FLONASE) 50 MCG/ACT nasal spray Place 2 sprays into both nostrils daily. 48 mL 3  . ipratropium (ATROVENT) 0.03 % nasal spray PLACE 2 SPRAYS INTO THE NOSE  4 (FOUR) TIMES DAILY. USE AS NEEDED FOR POST NASAL DRIP 30 mL 6  . levocetirizine (XYZAL) 5 MG tablet TAKE 1 TABLET BY MOUTH EVERY DAY IN THE EVENING (Patient taking differently: Take 5 mg by mouth daily.) 90 tablet 3  . levothyroxine (SYNTHROID) 125 MCG tablet Take 1 tablet (125 mcg total) by mouth daily. (Patient taking differently: Take 62.5 mcg by mouth daily.) 90 tablet 1  . omeprazole (PRILOSEC) 40 MG capsule TAKE 1 CAPSULE (40 MG TOTAL) BY MOUTH DAILY. 30 capsule 1  . Polyvinyl Alcohol-Povidone (REFRESH OP) Place 1 drop into both eyes daily as needed (dry eyes).    . simvastatin (ZOCOR) 20 MG tablet Take 1 tablet (20 mg total) by mouth at bedtime. 90 tablet 1  . telmisartan (MICARDIS) 80 MG tablet Take 1 tablet (80 mg total) by mouth daily. 90 tablet 1   No current facility-administered medications on file prior to visit.    Review of Systems:  As per HPI- otherwise negative.   Physical Examination: Vitals:   04/23/20 1403 04/23/20 1420  BP: (!) 148/88 128/80  Pulse: 77   Resp: 16   Temp: 98.3 F (36.8 C)   SpO2: 97%    Vitals:   04/23/20 1403  Weight: 158 lb (71.7 kg)  Height: 5' (1.524 m)   Body mass index is 30.86 kg/m. Ideal Body Weight: Weight in (lb) to have BMI = 25: 127.7  GEN: no acute distress.  Mild obesity, looks well  HEENT: Atraumatic, Normocephalic.  Ears and Nose: No external deformity. Bilateral TM wnl, oropharynx normal.  PEERL,EOMI.   CV: RRR, No M/G/R. No JVD. No thrill. No extra heart sounds. PULM: CTA B, no wheezes, crackles, rhonchi.  No retractions. No resp. distress. No accessory muscle use. ABD: S, NT, ND, +BS. No rebound. No HSM. EXTR: No c/c/e PSYCH: Normally interactive. Conversant.  No carotid bruits auscultated  BP Readings from Last 3 Encounters:  04/23/20 128/80  01/06/20 140/88  10/24/19 104/64     Assessment and Plan: Neck pain on right side - Plan: US Carotid Duplex Bilateral  Tremor - Plan: TSH, CANCELED: TSH  Depression with anxiety - Plan: FLUoxetine (PROZAC) 20 MG capsule, DISCONTINUED: FLUoxetine (PROZAC) 20 MG capsule  Here today with a couple concerns.  She had an unusual episode where she felt a pulsing sensation in her right neck.  This was not actually painful.  We will refer her for a carotid ultrasound to look for any plaques If this is normal and she continues to have symptoms we may consider a CT angiogram Suspect tremor is a benign familial tremor.  It has been 6 months since last thyroid check, will repeat this for her today History of depression and anxiety.  She has been taking Wellbutrin for some time, previously took Prozac for many years.  She recently feels like her symptoms are not as well controlled.  We will have her taper off of Wellbutrin and back on the Prozac, see patient instructions for details.  She will let me know how this works for her  This visit occurred during the SARS-CoV-2 public health emergency.  Safety protocols were in place, including screening questions prior to the visit, additional usage of staff PPE, and extensive cleaning of exam room while observing appropriate contact time as indicated for disinfecting solutions.    Signed Lamar Blinks, MD  addnd 4/15-received her labs as follows, message to patient  Results for orders placed or performed in visit on 04/23/20  TSH  Result Value Ref Range   TSH 2.47 0.40 - 4.50 mIU/L

## 2020-04-22 NOTE — Patient Instructions (Addendum)
It was great to see you again today!   We will get a carotid US for you- this can be done at [facility] at your convenience - you an call to schedule  Phone: Phone: [phone] We will check your TSH today Please taper off wellbutrin- take one a day for one week.  Then you can start back on prozac- 20 mg, increase to 40 mg after 2 weeks if desired

## 2020-04-23 ENCOUNTER — Encounter: Payer: Self-pay | Admitting: Family Medicine

## 2020-04-23 ENCOUNTER — Ambulatory Visit (INDEPENDENT_AMBULATORY_CARE_PROVIDER_SITE_OTHER): Payer: PPO | Admitting: Family Medicine

## 2020-04-23 ENCOUNTER — Other Ambulatory Visit: Payer: Self-pay

## 2020-04-23 VITALS — BP 128/80 | HR 77 | Temp 98.3°F | Resp 16 | Ht 60.0 in | Wt 158.0 lb

## 2020-04-23 DIAGNOSIS — R251 Tremor, unspecified: Secondary | ICD-10-CM | POA: Diagnosis not present

## 2020-04-23 DIAGNOSIS — F418 Other specified anxiety disorders: Secondary | ICD-10-CM

## 2020-04-23 DIAGNOSIS — M542 Cervicalgia: Secondary | ICD-10-CM | POA: Diagnosis not present

## 2020-04-23 MED ORDER — FLUOXETINE HCL 20 MG PO CAPS: 20.0000 mg | ORAL_CAPSULE | Freq: Every day | ORAL | 3 refills | Status: DC

## 2020-04-24 ENCOUNTER — Encounter: Payer: Self-pay | Admitting: Family Medicine

## 2020-04-24 LAB — TSH: TSH: 2.47 mIU/L (ref 0.40–4.50)

## 2020-04-28 ENCOUNTER — Ambulatory Visit
Admission: RE | Admit: 2020-04-28 | Discharge: 2020-04-28 | Disposition: A | Discharge status: Home / Self Care | Payer: PPO | Source: Ambulatory Visit | Attending: Family Medicine | Admitting: Family Medicine

## 2020-04-28 DIAGNOSIS — I6523 Occlusion and stenosis of bilateral carotid arteries: Secondary | ICD-10-CM | POA: Diagnosis not present

## 2020-04-28 DIAGNOSIS — E041 Nontoxic single thyroid nodule: Secondary | ICD-10-CM | POA: Diagnosis not present

## 2020-04-28 DIAGNOSIS — M542 Cervicalgia: Secondary | ICD-10-CM

## 2020-04-29 ENCOUNTER — Other Ambulatory Visit: Payer: Self-pay | Admitting: Family Medicine

## 2020-04-29 ENCOUNTER — Encounter: Payer: Self-pay | Admitting: Family Medicine

## 2020-04-29 ENCOUNTER — Telehealth: Payer: Self-pay | Admitting: Family Medicine

## 2020-04-29 DIAGNOSIS — E041 Nontoxic single thyroid nodule: Secondary | ICD-10-CM

## 2020-04-29 NOTE — Telephone Encounter (Signed)
Calling to Give Report from [facility] that was done on 04/28/20 Caller name Tin [phone]

## 2020-04-29 NOTE — Telephone Encounter (Signed)
Ordered thyroid US

## 2020-04-29 NOTE — Telephone Encounter (Signed)
Imaging calling to report ultrasound performed yesterday 04/28/2020. US carotid. Follow up needed.

## 2020-05-16 ENCOUNTER — Other Ambulatory Visit: Payer: Self-pay | Admitting: Family Medicine

## 2020-05-16 DIAGNOSIS — F339 Major depressive disorder, recurrent, unspecified: Secondary | ICD-10-CM

## 2020-05-19 ENCOUNTER — Ambulatory Visit (HOSPITAL_BASED_OUTPATIENT_CLINIC_OR_DEPARTMENT_OTHER)
Admission: RE | Admit: 2020-05-19 | Discharge: 2020-05-19 | Disposition: A | Discharge status: Home / Self Care | Payer: PPO | Source: Ambulatory Visit | Attending: Family Medicine | Admitting: Family Medicine

## 2020-05-19 ENCOUNTER — Other Ambulatory Visit: Payer: Self-pay

## 2020-05-19 ENCOUNTER — Telehealth: Payer: Self-pay

## 2020-05-19 ENCOUNTER — Encounter: Payer: Self-pay | Admitting: Family Medicine

## 2020-05-19 ENCOUNTER — Other Ambulatory Visit: Payer: Self-pay | Admitting: Family Medicine

## 2020-05-19 ENCOUNTER — Ambulatory Visit (INDEPENDENT_AMBULATORY_CARE_PROVIDER_SITE_OTHER): Payer: PPO

## 2020-05-19 DIAGNOSIS — R1013 Epigastric pain: Secondary | ICD-10-CM

## 2020-05-19 DIAGNOSIS — M810 Age-related osteoporosis without current pathological fracture: Secondary | ICD-10-CM

## 2020-05-19 DIAGNOSIS — E042 Nontoxic multinodular goiter: Secondary | ICD-10-CM | POA: Diagnosis not present

## 2020-05-19 DIAGNOSIS — E041 Nontoxic single thyroid nodule: Secondary | ICD-10-CM | POA: Diagnosis not present

## 2020-05-19 MED ORDER — DENOSUMAB 60 MG/ML ~~LOC~~ SOSY
60.0000 mg | PREFILLED_SYRINGE | Freq: Once | SUBCUTANEOUS | Status: AC
  Administered 2020-05-19: 60 mg via SUBCUTANEOUS

## 2020-05-19 NOTE — Progress Notes (Addendum)
Patient here today for Prolia Injection. 26mL given in left arm SQ. Patient tolerated well. Next injection due 11/20/2020 or after.

## 2020-05-19 NOTE — Telephone Encounter (Signed)
Patient here today for NV-she did mention she had a recent ultrasound-found nodule on thyroid-She is following up with thyroid ultrasound today.   Patient states she has a history of thyroid nodule approx 40 years ago. Hx biopsy normal. Treated with synthroid and has been on synthroid for years. She states she saw endo in past but doesn't remember the providers name. She went for her ultrasound today but she isnt sure if biopsy is necessary to go through again.

## 2020-05-20 ENCOUNTER — Ambulatory Visit (INDEPENDENT_AMBULATORY_CARE_PROVIDER_SITE_OTHER): Payer: PPO

## 2020-05-20 DIAGNOSIS — I495 Sick sinus syndrome: Secondary | ICD-10-CM

## 2020-05-21 ENCOUNTER — Encounter: Payer: Self-pay | Admitting: Family Medicine

## 2020-05-21 LAB — CUP PACEART REMOTE DEVICE CHECK
Date Time Interrogation Session: 20220511075433
Implantable Lead Implant Date: 20171101
Implantable Lead Implant Date: 20171101
Implantable Lead Location: 753859
Implantable Lead Location: 753860
Implantable Lead Model: 377
Implantable Lead Model: 377
Implantable Lead Serial Number: 49553678
Implantable Lead Serial Number: 49617393
Implantable Pulse Generator Implant Date: 20171101
Pulse Gen Model: 394969
Pulse Gen Serial Number: 68817609

## 2020-05-22 NOTE — Addendum Note (Signed)
Addended by: Lamar Blinks C on: 05/22/2020 02:20 PM   Modules accepted: Orders

## 2020-05-25 ENCOUNTER — Other Ambulatory Visit: Payer: Self-pay | Admitting: Family Medicine

## 2020-05-25 DIAGNOSIS — F5101 Primary insomnia: Secondary | ICD-10-CM

## 2020-05-26 ENCOUNTER — Ambulatory Visit
Admission: RE | Admit: 2020-05-26 | Discharge: 2020-05-26 | Disposition: A | Discharge status: Home / Self Care | Payer: PPO | Source: Ambulatory Visit | Attending: Family Medicine | Admitting: Family Medicine

## 2020-05-26 ENCOUNTER — Other Ambulatory Visit (HOSPITAL_COMMUNITY)
Admission: RE | Admit: 2020-05-26 | Discharge: 2020-05-26 | Disposition: A | Discharge status: Home / Self Care | Payer: PPO | Source: Ambulatory Visit | Attending: Family Medicine | Admitting: Family Medicine

## 2020-05-26 DIAGNOSIS — D440 Neoplasm of uncertain behavior of thyroid gland: Secondary | ICD-10-CM | POA: Insufficient documentation

## 2020-05-26 DIAGNOSIS — E041 Nontoxic single thyroid nodule: Secondary | ICD-10-CM

## 2020-05-26 DIAGNOSIS — R896 Abnormal cytological findings in specimens from other organs, systems and tissues: Secondary | ICD-10-CM | POA: Diagnosis not present

## 2020-05-27 ENCOUNTER — Other Ambulatory Visit: Payer: Self-pay | Admitting: Family Medicine

## 2020-05-27 ENCOUNTER — Encounter: Payer: Self-pay | Admitting: Family Medicine

## 2020-05-27 DIAGNOSIS — E079 Disorder of thyroid, unspecified: Secondary | ICD-10-CM

## 2020-05-27 LAB — CYTOLOGY - NON PAP

## 2020-05-30 DIAGNOSIS — E041 Nontoxic single thyroid nodule: Secondary | ICD-10-CM | POA: Diagnosis not present

## 2020-06-01 DIAGNOSIS — L820 Inflamed seborrheic keratosis: Secondary | ICD-10-CM | POA: Diagnosis not present

## 2020-06-01 DIAGNOSIS — D225 Melanocytic nevi of trunk: Secondary | ICD-10-CM | POA: Diagnosis not present

## 2020-06-01 DIAGNOSIS — L570 Actinic keratosis: Secondary | ICD-10-CM | POA: Diagnosis not present

## 2020-06-01 DIAGNOSIS — L438 Other lichen planus: Secondary | ICD-10-CM | POA: Diagnosis not present

## 2020-06-01 DIAGNOSIS — L821 Other seborrheic keratosis: Secondary | ICD-10-CM | POA: Diagnosis not present

## 2020-06-10 ENCOUNTER — Encounter: Payer: Self-pay | Admitting: Family Medicine

## 2020-06-11 ENCOUNTER — Encounter (HOSPITAL_COMMUNITY): Payer: Self-pay

## 2020-06-11 NOTE — Progress Notes (Signed)
Remote pacemaker transmission.   

## 2020-06-29 ENCOUNTER — Telehealth: Payer: Self-pay | Admitting: Pharmacist

## 2020-06-29 NOTE — Chronic Care Management (AMB) (Signed)
Chronic Care Management Pharmacy Assistant   Name: Theresa Mejia  MRN: [id number] DOB: [birth year]   Reason for Encounter: Disease State-Hypertension    Recent office visits:  04/23/20-Jessica Copland, MD PCP-Follow up Neck pain. Referral placed for carotid US.Restart prozac 20 mg, increase to 40 mg after two weeks if desired. Taper off wellbutrin.  04/06/20 Lamar Blinks, MD PCP Video visit- Exposure to covid19.  01/06/20 Lamar Blinks, MD PCP-Seen for epigastric pain. Labs ordered. Start omeprazole 40 mg and sucralfate 1 g  Recent consult visits:  02/19/20 Cristopher Peru, Cardiology-Sick sinus syndrome  01/29/20 Tania Ade orthopedics- Follow up shoulder replacement.  Hospital visits:  None in previous 6 months  Medications: Outpatient Encounter Medications as of 06/29/2020  Medication Sig   amLODipine (NORVASC) 5 MG tablet Take 1 tablet (5 mg total) by mouth daily.   calcium carbonate (TUMS - DOSED IN MG ELEMENTAL CALCIUM) 500 MG chewable tablet Chew 1 tablet by mouth 4 (four) times daily as needed for indigestion or heartburn.   Cholecalciferol (VITAMIN D3) 50 MCG (2000 UT) TABS Take 2,000 Units by mouth every other day.    clonazePAM (KLONOPIN) 0.5 MG tablet TAKE 1/2 AT BEDTIME AS NEEDED FOR INSOMNIA, MAY INCREASE TO A WHOLE TABLET IF NEEDED   famotidine (PEPCID) 10 MG tablet Take 10 mg by mouth daily as needed for heartburn or indigestion.   FLUoxetine (PROZAC) 20 MG capsule Take 1 capsule (20 mg total) by mouth daily. Increase to 40 mg after 2 weeks   fluticasone (FLONASE) 50 MCG/ACT nasal spray Place 2 sprays into both nostrils daily.   ipratropium (ATROVENT) 0.03 % nasal spray PLACE 2 SPRAYS INTO THE NOSE 4 (FOUR) TIMES DAILY. USE AS NEEDED FOR POST NASAL DRIP   levocetirizine (XYZAL) 5 MG tablet TAKE 1 TABLET BY MOUTH EVERY DAY IN THE EVENING (Patient taking differently: Take 5 mg by mouth daily.)   levothyroxine (SYNTHROID) 125 MCG tablet Take 1 tablet (125  mcg total) by mouth daily. (Patient taking differently: Take 62.5 mcg by mouth daily.)   omeprazole (PRILOSEC) 40 MG capsule TAKE 1 CAPSULE (40 MG TOTAL) BY MOUTH DAILY.   Polyvinyl Alcohol-Povidone (REFRESH OP) Place 1 drop into both eyes daily as needed (dry eyes).   simvastatin (ZOCOR) 20 MG tablet Take 1 tablet (20 mg total) by mouth at bedtime.   telmisartan (MICARDIS) 80 MG tablet Take 1 tablet (80 mg total) by mouth daily.   No facility-administered encounter medications on file as of 06/29/2020.   Reviewed chart prior to disease state call. Spoke with patient regarding BP  Recent Office Vitals: BP Readings from Last 3 Encounters:  04/23/20 128/80  01/06/20 140/88  10/24/19 104/64   Pulse Readings from Last 3 Encounters:  04/23/20 77  01/06/20 77  10/24/19 76    Wt Readings from Last 3 Encounters:  04/23/20 158 lb (71.7 kg)  01/06/20 151 lb (68.5 kg)  10/24/19 149 lb (67.6 kg)     Kidney Function Lab Results  Component Value Date/Time   CREATININE 0.77 01/06/2020 10:54 AM   CREATININE 0.84 08/01/2019 10:58 AM   CREATININE 0.70 11/04/2015 11:06 AM   CREATININE 0.69 09/27/2012 07:35 AM   GFR 74.61 01/06/2020 10:54 AM   GFRNONAA >60 08/01/2019 10:58 AM   GFRAA >60 08/01/2019 10:58 AM    BMP Latest Ref Rng & Units 01/06/2020 08/01/2019 10/22/2018  Glucose 70 - 99 mg/dL 92 92 94  BUN 6 - 23 mg/dL 23 29(H) 27(H)  Creatinine 0.40 -  1.20 mg/dL 0.77 0.84 0.87  Sodium 135 - 145 mEq/L 141 140 140  Potassium 3.5 - 5.1 mEq/L 3.9 4.0 3.9  Chloride 96 - 112 mEq/L 105 104 104  CO2 19 - 32 mEq/L 29 28 29   Calcium 8.4 - 10.5 mg/dL 8.8 9.3 9.4    Current antihypertensive regimen:  Amlodipine 5 mg take 1 tablet daily Telmisartan 80 mg take 1 tablet daily   How often are you checking your Blood Pressure? when feeling symptomatic  Current home BP readings: 120/70s patient is unsure of exact number since she is not writing them down.  What recent interventions/DTPs have been  made by any provider to improve Blood Pressure control since last CPP Visit: None  Any recent hospitalizations or ED visits since last visit with CPP? No  What diet changes have been made to improve Blood Pressure Control?  Patient states she has not made any diet changes.  What exercise is being done to improve your Blood Pressure Control?  Patient states she exercises 3-4 times per week.  Adherence Review: Is the patient currently on ACE/ARB medication? Yes Does the patient have >5 day gap between last estimated fill dates? No  Patient states she will start checking her blood pressure daily and will start writing it down.   Star Rating Drugs: Simvastatin 20 mg last filled 04/20/20 90 DS Telmisartan 80 mg last filled 04/21/20 90 DS  [facility] Clinical Pharmacist Assistant [phone]

## 2020-07-06 NOTE — Chronic Care Management (AMB) (Signed)
Called patient to schedule follow up appointment. Patient would like to have a telephone visit on 08/11/20 at 2:30.  Lizbeth Bark Clinical Pharmacist Assistant [phone]

## 2020-07-14 ENCOUNTER — Ambulatory Visit: Payer: PPO | Admitting: Internal Medicine

## 2020-07-14 ENCOUNTER — Other Ambulatory Visit: Payer: Self-pay

## 2020-07-14 ENCOUNTER — Encounter: Payer: Self-pay | Admitting: Internal Medicine

## 2020-07-14 VITALS — BP 126/80 | HR 76 | Ht 63.5 in | Wt 153.0 lb

## 2020-07-14 DIAGNOSIS — E039 Hypothyroidism, unspecified: Secondary | ICD-10-CM | POA: Diagnosis not present

## 2020-07-14 DIAGNOSIS — E042 Nontoxic multinodular goiter: Secondary | ICD-10-CM | POA: Diagnosis not present

## 2020-07-14 DIAGNOSIS — R5382 Chronic fatigue, unspecified: Secondary | ICD-10-CM

## 2020-07-14 MED ORDER — LEVOTHYROXINE SODIUM 75 MCG PO TABS: 75.0000 ug | ORAL_TABLET | Freq: Every day | ORAL | 3 refills | Status: DC

## 2020-07-14 NOTE — Progress Notes (Signed)
Name: Theresa Mejia  MRN/ DOB: [id number], May 23, 1940    Age/ Sex: 80 y.o., female    PCP: Darreld Mclean, MD   Reason for Endocrinology Evaluation: MNG     Date of Initial Endocrinology Evaluation: 07/14/2020     HPI: Theresa Mejia is a 80 y.o. female with a past medical history of HTn and MNG. The patient presented for initial endocrinology clinic visit on 07/14/2020 for consultative assistance with her MNG.   She had an incidental finding of thyroid nodule on carotid doppler which prompted a thyroid ultrasound revealing MNG with left mid 2.9 cm nodule . S/P FNA on 05/26/2020 with Atypia of undetermined significance (Bethesda category III)    Per pt , she has been diagnosed  MNG approximately 30 yrs ago, and was evaluated by endocrinology , S/P FNA with benign cytology . Was put on Levothyroxine to prevent MNG but years later this was stopped and she was released from endocrinology practice  Has chronic fatigue , takes naps during the day, was on CPAP in the past .    Doesn't sleep well and has night mares   No prior exposure to radiation   No FH of thyroid disease   She is on Levothyroxine 125 mcg, HALF a tablet daily      HISTORY:  Past Medical History:  Past Medical History:  Diagnosis Date   Allergic rhinitis    Depression    DJD (degenerative joint disease)    GERD (gastroesophageal reflux disease)    Headache(784.0)    sinus   Hypercholesteremia    Hypersomnia    Hypertension    Hypothyroidism    Internal hemorrhoid 11/2008   s/p banding ([facility])   Lumbar disc disease    OBSTRUCTIVE SLEEP APNEA    NPSG 04/29/07- AHI 1.1/hr, RDI 21.2/hr. Weight 176 lbs. CPAP was tried based on the RDI.     OVERACTIVE BLADDER    pt unaware   Presence of permanent cardiac pacemaker 11/11/2015   Reactive depression (situational)    Sinus node dysfunction (HCC)    Spondylosis, cervical    Past Surgical History:  Past Surgical History:  Procedure Laterality Date    BIOPSY BREAST     BREAST EXCISIONAL BIOPSY Left 2011   CATARACT EXTRACTION W/ INTRAOCULAR LENS IMPLANT Right    COLONOSCOPY     EP IMPLANTABLE DEVICE N/A 11/11/2015   Procedure: Pacemaker Implant;  Surgeon: Evans Lance, MD;  Location: [facility];  Service: Cardiovascular;  Laterality: N/A;   eye lid lift     INSERT / REPLACE / REMOVE PACEMAKER  11/11/2015   KNEE ARTHROSCOPY     Dr. [name]   MOUTH SURGERY  08/25/2017   implant fell out due to bone loss   REVERSE SHOULDER ARTHROPLASTY Right 08/08/2019   Procedure: REVERSE SHOULDER ARTHROPLASTY;  Surgeon: Tania Ade, MD;  Location: [facility];  Service: Orthopedics;  Laterality: Right;   rotator cuff surgery Right    Dr. [name]   TUBAL LIGATION      Social History:  reports that she quit smoking about 54 years ago. Her smoking use included cigarettes. She has never used smokeless tobacco. She reports that she does not drink alcohol and does not use drugs. Family History: family history includes Alzheimer's disease in her paternal aunt and paternal uncle; Cancer (age of onset: 37) in her mother; Crohn's disease (age of onset: 87) in her father; Diabetes in her paternal grandmother; Hypothyroidism in her  mother; Macular degeneration in her mother; Parkinsonism in her maternal grandmother; Stroke (age of onset: 81) in her mother; Tremor in her mother.   HOME MEDICATIONS: Allergies as of 07/14/2020       Reactions   Ramipril    Unknown reaction   Sulfa Antibiotics    Says it dropped her blood pressure really low.    Sulfonamide Derivatives    REACTION: hypotension        Medication List        Accurate as of July 14, 2020  2:14 PM. If you have any questions, ask your nurse or doctor.          STOP taking these medications    famotidine 10 MG tablet Commonly known as: PEPCID Stopped by: Dorita Sciara, MD       TAKE these medications    amLODipine 5 MG tablet Commonly known as: NORVASC Take 1 tablet (5  mg total) by mouth daily.   calcium carbonate 500 MG chewable tablet Commonly known as: TUMS - dosed in mg elemental calcium Chew 1 tablet by mouth 4 (four) times daily as needed for indigestion or heartburn.   clonazePAM 0.5 MG tablet Commonly known as: KLONOPIN TAKE 1/2 AT BEDTIME AS NEEDED FOR INSOMNIA, MAY INCREASE TO A WHOLE TABLET IF NEEDED   FLUoxetine 20 MG capsule Commonly known as: PROzac Take 1 capsule (20 mg total) by mouth daily. Increase to 40 mg after 2 weeks   fluticasone 50 MCG/ACT nasal spray Commonly known as: FLONASE Place 2 sprays into both nostrils daily.   ipratropium 0.03 % nasal spray Commonly known as: ATROVENT PLACE 2 SPRAYS INTO THE NOSE 4 (FOUR) TIMES DAILY. USE AS NEEDED FOR POST NASAL DRIP   levocetirizine 5 MG tablet Commonly known as: XYZAL TAKE 1 TABLET BY MOUTH EVERY DAY IN THE EVENING What changed:  how much to take how to take this when to take this   levothyroxine 125 MCG tablet Commonly known as: SYNTHROID Take 1 tablet (125 mcg total) by mouth daily. What changed: how much to take   omeprazole 40 MG capsule Commonly known as: PRILOSEC TAKE 1 CAPSULE (40 MG TOTAL) BY MOUTH DAILY.   REFRESH OP Place 1 drop into both eyes daily as needed (dry eyes).   simvastatin 20 MG tablet Commonly known as: ZOCOR Take 1 tablet (20 mg total) by mouth at bedtime.   telmisartan 80 MG tablet Commonly known as: MICARDIS Take 1 tablet (80 mg total) by mouth daily.   Vitamin D3 50 MCG (2000 UT) Tabs Take 2,000 Units by mouth every other day.          REVIEW OF SYSTEMS: A comprehensive ROS was conducted with the patient and is negative except as per HPI     OBJECTIVE:  VS: BP 126/80   Pulse 76   Ht 5' 3.5" (1.613 m)   Wt 153 lb (69.4 kg)   SpO2 96%   BMI 26.68 kg/m    Wt Readings from Last 3 Encounters:  07/14/20 153 lb (69.4 kg)  04/23/20 158 lb (71.7 kg)  01/06/20 151 lb (68.5 kg)     EXAM: General: Pt appears well and  is in NAD  Neck: General: Supple without adenopathy. Thyroid: Left thyroid nodule appreciated.   Lungs: Clear with good BS bilat with no rales, rhonchi, or wheezes  Heart: Auscultation: RRR.  Abdomen: Normoactive bowel sounds, soft, nontender, without masses or organomegaly palpable  Extremities:  BL LE: No pretibial edema  normal ROM and strength.  Skin: Hair: Texture and amount normal with gender appropriate distribution Skin Inspection: No rashes Skin Palpation: Skin temperature, texture, and thickness normal to palpation  Neuro: Cranial nerves: II - XII grossly intact  DTRs: 2+ and symmetric in UE without delay in relaxation phase  Mental Status: Judgment, insight: Intact Orientation: Oriented to time, place, and person Mood and affect: No depression, anxiety, or agitation     DATA REVIEWED: Results for PIEPER, KASIK (MRN [id number]) as of 07/14/2020 14:39  Ref. Range 04/23/2020 14:43  TSH Latest Ref Range: 0.40 - 4.50 mIU/L 2.47    FNA 05/26/2020  Atypia of undetermined significance (Bethesda category III)    Afirma Benign    Thyroid Ultrasound 05/19/2020   Parenchymal Echotexture: Moderately heterogenous   Isthmus: Normal in size measuring 0.5 cm in diameter   Right lobe: Atrophic measuring 1.8 x 1.1 x 1.0 cm   Left lobe: Borderline enlarged measuring 5.3 x 1.6 x 1.3 cm   _________________________________________________________   Estimated total number of nodules >/= 1 cm: 2   Number of spongiform nodules >/=  2 cm not described below (TR1): 0   Number of mixed cystic and solid nodules >/= 1.5 cm not described below (TR2): 0   _________________________________________________________   Nodule # 1:   Location: Left; Mid - correlates with the nodule seen on preceding carotid Doppler ultrasound   Maximum size: 2.9 cm; Other 2 dimensions: 2.8 x 1.7 cm   Composition: solid/almost completely solid (2)   Echogenicity: isoechoic (1)   Shape: not  taller-than-wide (0)   Margins: ill-defined (0)   Echogenic foci: none (0)   ACR TI-RADS total points: 3.   ACR TI-RADS risk category: TR3 (3 points).   ACR TI-RADS recommendations:   **Given size (>/= 2.5 cm) and appearance, fine needle aspiration of this mildly suspicious nodule should be considered based on TI-RADS criteria.   _________________________________________________________   There is an approximately 0.8 cm spongiform/benign-appearing nodule within mid aspect of the left lobe of the thyroid (labeled 2), which does not meet criteria to recommend percutaneous sampling or continued dedicated follow-up.   IMPRESSION: 1. Atrophic and moderately heterogeneous appearing thyroid, nonspecific though could be seen in the setting of a chronic thyroiditis. 2. Dominant approximately 2.9 cm nodule replacing near the entirety of left lobe of the thyroid, correlating with the nodule seen preceding carotid Doppler ultrasound - by report, this nodule may have undergone ultrasound-guided fine-needle aspiration at an outside institution many years ago. If so and with benign pathologic diagnosis, repeat sampling and/or continued dedicated follow-up is not recommended. Otherwise, this nodule could undergo ultrasound-guided biopsy as indicated. 3. Additional solitary subcentimeter spongiform/benign-appearing nodule does meet criteria to recommend percutaneous sampling or continued dedicated follow-up.     Old records , labs and images have been reviewed.   ASSESSMENT/PLAN/RECOMMENDATIONS:   MNG:   - No local neck symptoms  - TSH is normal , but we discussed switching to daily dosing as she has been taking half a tablet of 125 mcg  - Will check TSH in 8 weeks  - Reassurance provided with benign Afirma, discussed high risk of false negative the larger the nodule size, emphasized the importance of serial monitoring to confirm stability for up to 5 yrs.  - Unfortunately prior  images not available   Medications : Stop Levothyroxine 125 mcg  Start Levothyroxine 75 mcg daily   2. Fatigue :  - Pt under the impression her thyroid has to do  with her chronic fatigue. Her TSH is at goal    - Pt was has night mares at times she screams or talks etc , this is chronic , no prior treatment in the past  - She also states she was on CPAP machine, but stopped it due to persistent fatigue , pt encouraged to address this with PCP and consider retesting , we discussed increased risk of HTN and cardiovascular disease in the setting of untreated OSA.   - I personally believe the above issues are contributing factors to her fatigue and worth addressing them.  - Will adjust her levothyroxine dose but I explained to her this does not guarantee resolution of fatigue as this is most likely multifactorial    Signed electronically by: Mack Guise, MD  [facility]  [address] [place], [place] [address] Phone: [phone] FAX: [phone]   CC: Darreld Mclean, [address] [place] [address] Phone: [phone] Fax: [phone]   Return to Endocrinology clinic as below: Future Appointments  Date Time Provider [place]  08/11/2020  2:30 PM [facility] PHARMACIST [facility]  08/19/2020  8:15 AM [facility] DEVICE REMOTES [facility]  08/25/2020  3:30 PM Evans Lance, MD [facility]  11/18/2020  8:15 AM [facility] DEVICE REMOTES [facility]  02/17/2021  8:15 AM [facility] DEVICE REMOTES [facility]

## 2020-07-14 NOTE — Patient Instructions (Signed)
-   STOP Levothyroxine 125 mcg daily  - START Levothyroxine 75 mcg daily

## 2020-07-18 ENCOUNTER — Other Ambulatory Visit: Payer: Self-pay | Admitting: Family Medicine

## 2020-07-18 DIAGNOSIS — J301 Allergic rhinitis due to pollen: Secondary | ICD-10-CM

## 2020-07-21 DIAGNOSIS — K6289 Other specified diseases of anus and rectum: Secondary | ICD-10-CM | POA: Diagnosis not present

## 2020-07-21 DIAGNOSIS — Z1211 Encounter for screening for malignant neoplasm of colon: Secondary | ICD-10-CM | POA: Diagnosis not present

## 2020-07-21 DIAGNOSIS — Z86010 Personal history of colonic polyps: Secondary | ICD-10-CM | POA: Diagnosis not present

## 2020-07-21 DIAGNOSIS — K5730 Diverticulosis of large intestine without perforation or abscess without bleeding: Secondary | ICD-10-CM | POA: Diagnosis not present

## 2020-07-21 DIAGNOSIS — K648 Other hemorrhoids: Secondary | ICD-10-CM | POA: Diagnosis not present

## 2020-08-02 ENCOUNTER — Other Ambulatory Visit: Payer: Self-pay | Admitting: Family Medicine

## 2020-08-06 ENCOUNTER — Encounter: Payer: Self-pay | Admitting: Family Medicine

## 2020-08-06 ENCOUNTER — Telehealth: Payer: Self-pay | Admitting: *Deleted

## 2020-08-06 ENCOUNTER — Other Ambulatory Visit: Payer: Self-pay

## 2020-08-06 ENCOUNTER — Ambulatory Visit (INDEPENDENT_AMBULATORY_CARE_PROVIDER_SITE_OTHER): Payer: PPO | Admitting: Family Medicine

## 2020-08-06 VITALS — BP 110/70 | HR 82 | Temp 98.1°F | Ht 59.0 in | Wt 148.2 lb

## 2020-08-06 DIAGNOSIS — R42 Dizziness and giddiness: Secondary | ICD-10-CM

## 2020-08-06 DIAGNOSIS — R002 Palpitations: Secondary | ICD-10-CM | POA: Diagnosis not present

## 2020-08-06 DIAGNOSIS — R5383 Other fatigue: Secondary | ICD-10-CM | POA: Diagnosis not present

## 2020-08-06 NOTE — Progress Notes (Signed)
[place] [facility] [address], [place], [place] [address] [phone]  Date:  08/06/2020   Name:  Theresa Mejia   DOB:  Jun 06, 1940   MRN:  [id number]  PCP:  Darreld Mclean, MD    Chief Complaint: No chief complaint on file.   History of Present Illness:  Theresa Mejia is a 80 y.o. very pleasant female patient who presents with the following:  Pt called in this am early with concern of feeling lightheaded and having low BP and not feeling quite well She has a pacemaker, placed in 2017 for SSS with HR under 50 She has noted sx -feeling unwell -for a week and maybe a bit longer  BP readings 103/70, 106/75. She states her pulse was 79. She states that she has "felt very foggy" and has been lightheaded and might get clammy.  She feels tired and like she does not have any energy  Her husband drove her here as she was a bit concerned about driving She has not fainted or fallen She will feel lightheaded when she stands up, although does have to stand up to feel lightheaded No CP or SOB She may get some palpitations  She is seeing Dr Lovena Le soon to check on her pacemaker   No sx of illness except she has noted some constipation recently  No cough, no fever She is passing harder and small stools  She did get a colonoscopy a couple of weeks ago   Last seen by myself in April  History of OSA, hypothyroidism, hyperlipidemia, HTN, pacemaker implanted 2017  Her thyroid med was changed recently - dose was increased  Lab Results  Component Value Date   TSH 2.47 04/23/2020    Patient Active Problem List   Diagnosis Date Noted   Osteopenia 01/09/2019   Pansinusitis 03/06/2018   Cough 03/06/2018   Orthostatic hypotension 12/06/2016   Viral URI with cough 04/20/2016   Sinus node dysfunction ([place]) 11/11/2015   Routine general medical examination at a health care facility 06/04/2015   Insomnia with sleep apnea 06/04/2015    Bronchitis, chronic obstructive w acute bronchitis ([place]) 12/10/2014   Lumbar disc disease 09/01/2010   Obstructive sleep apnea 05/31/2007   Hyperlipidemia with target LDL less than 130 02/05/2007   Depression with anxiety 02/05/2007   Allergic rhinitis 02/05/2007   Hypothyroidism 02/02/2007   Essential hypertension 02/02/2007    Past Medical History:  Diagnosis Date   Allergic rhinitis    Depression    DJD (degenerative joint disease)    GERD (gastroesophageal reflux disease)    Headache(784.0)    sinus   Hypercholesteremia    Hypersomnia    Hypertension    Hypothyroidism    Internal hemorrhoid 11/2008   s/p banding ([facility])   Lumbar disc disease    OBSTRUCTIVE SLEEP APNEA    NPSG 04/29/07- AHI 1.1/hr, RDI 21.2/hr. Weight 176 lbs. CPAP was tried based on the RDI.     OVERACTIVE BLADDER    pt unaware   Presence of permanent cardiac pacemaker 11/11/2015   Reactive depression (situational)    Sinus node dysfunction (HCC)    Spondylosis, cervical     Past Surgical History:  Procedure Laterality Date   BIOPSY BREAST     BREAST EXCISIONAL BIOPSY Left 2011   CATARACT EXTRACTION W/ INTRAOCULAR LENS IMPLANT Right    COLONOSCOPY     EP IMPLANTABLE DEVICE N/A 11/11/2015   Procedure:  Pacemaker Implant;  Surgeon: Evans Lance, MD;  Location: [facility];  Service: Cardiovascular;  Laterality: N/A;   eye lid lift     INSERT / REPLACE / REMOVE PACEMAKER  11/11/2015   KNEE ARTHROSCOPY     Dr. [name]   MOUTH SURGERY  08/25/2017   implant fell out due to bone loss   REVERSE SHOULDER ARTHROPLASTY Right 08/08/2019   Procedure: REVERSE SHOULDER ARTHROPLASTY;  Surgeon: Tania Ade, MD;  Location: [facility];  Service: Orthopedics;  Laterality: Right;   rotator cuff surgery Right    Dr. [name]   TUBAL LIGATION      Social History   Tobacco Use   Smoking status: Former    Years: 8.00    Types: Cigarettes    Quit date: 01/10/1966    Years since quitting: 10.6   Smokeless  tobacco: Never  Vaping Use   Vaping Use: Never used  Substance Use Topics   Alcohol use: No   Drug use: No    Family History  Problem Relation Age of Onset   Crohn's disease Father 20   Macular degeneration Mother    Cancer Mother 26       bladder, never confirmed with biospy   Hypothyroidism Mother    Tremor Mother    Stroke Mother 52   Parkinsonism Maternal Grandmother    Alzheimer's disease Paternal Aunt    Alzheimer's disease Paternal Uncle    Diabetes Paternal Grandmother    Colon cancer Neg Hx    Esophageal cancer Neg Hx    Rectal cancer Neg Hx     Allergies  Allergen Reactions   Ramipril     Unknown reaction   Sulfa Antibiotics     Says it dropped her blood pressure really low.    Sulfonamide Derivatives     REACTION: hypotension    Medication list has been reviewed and updated.  Current Outpatient Medications on File Prior to Visit  Medication Sig Dispense Refill   amLODipine (NORVASC) 5 MG tablet Take 1 tablet (5 mg total) by mouth daily. 90 tablet 1   calcium carbonate (TUMS - DOSED IN MG ELEMENTAL CALCIUM) 500 MG chewable tablet Chew 1 tablet by mouth 4 (four) times daily as needed for indigestion or heartburn.     Cholecalciferol (VITAMIN D3) 50 MCG (2000 UT) TABS Take 2,000 Units by mouth every other day.      clonazePAM (KLONOPIN) 0.5 MG tablet TAKE 1/2 AT BEDTIME AS NEEDED FOR INSOMNIA, MAY INCREASE TO A WHOLE TABLET IF NEEDED 30 tablet 1   FLUoxetine (PROZAC) 20 MG capsule Take 1 capsule (20 mg total) by mouth daily. Increase to 40 mg after 2 weeks 180 capsule 3   fluticasone (FLONASE) 50 MCG/ACT nasal spray Place 2 sprays into both nostrils daily. 48 mL 3   ipratropium (ATROVENT) 0.03 % nasal spray PLACE 2 SPRAYS INTO THE NOSE 4 (FOUR) TIMES DAILY. USE AS NEEDED FOR POST NASAL DRIP 30 mL 6   levocetirizine (XYZAL) 5 MG tablet TAKE 1 TABLET BY MOUTH EVERY DAY IN THE EVENING 90 tablet 1   levothyroxine (SYNTHROID) 75 MCG tablet Take 1 tablet (75 mcg  total) by mouth daily. 90 tablet 3   omeprazole (PRILOSEC) 40 MG capsule TAKE 1 CAPSULE (40 MG TOTAL) BY MOUTH DAILY. 30 capsule 1   Polyvinyl Alcohol-Povidone (REFRESH OP) Place 1 drop into both eyes daily as needed (dry eyes).     simvastatin (ZOCOR) 20 MG tablet Take 1 tablet (20  mg total) by mouth at bedtime. 90 tablet 1   telmisartan (MICARDIS) 80 MG tablet TAKE 1 TABLET BY MOUTH EVERY DAY 90 tablet 1   No current facility-administered medications on file prior to visit.    Review of Systems:  As per HPI- otherwise negative.   Physical Examination: Vitals:   08/06/20 1555  BP: 110/70  Pulse: 82  Temp: 98.1 F (36.7 C)  SpO2: 96%   Vitals:   08/06/20 1555  Weight: 148 lb 3.2 oz (67.2 kg)  Height: '4\' 11"'$  (1.499 m)   Body mass index is 29.93 kg/m. Ideal Body Weight: Weight in (lb) to have BMI = 25: 123.5  GEN: no acute distress. HEENT: Atraumatic, Normocephalic.  Ears and Nose: No external deformity. CV: RRR, No M/G/R. No JVD. No thrill. No extra heart sounds. PULM: CTA B, no wheezes, crackles, rhonchi. No retractions. No resp. distress. No accessory muscle use. ABD: S, NT, ND, +BS. No rebound. No HSM. EXTR: No c/c/e PSYCH: Normally interactive. Conversant.   EKG; SR with low voltage possible old infarct, inferior Compared with the EKG tracing from December 2021 no significant change noted   Upon standing, blood pressure dropped to 94/68, pulse 90 Assessment and Plan: Lightheaded - Plan: CBC, Comprehensive metabolic panel  Palpitations - Plan: TSH, EKG 12-Lead  Fatigue, unspecified type - Plan: TSH, VITAMIN D 25 Hydroxy (Vit-D Deficiency, Fractures) Seen today with somewhat vague feelings of malaise, most prominent feature seems to be lightheadedness and associated clammy feeling.  I advised Edris that I do not have a definite diagnosis for her today, offered to have her seen in the ER if she would like.  At this time she declines.  We have some lab work pending.   Her symptoms may be due to orthostatic hypotension.  I advised her to hold amlodipine for the time being.  She also has noted palpitations,?  Could she be having intermittent arrhythmia.  She does have a pacemaker in place.  I have sent a message to her cardiologist asking if a Zio patch would be helpful  She will seek care if getting worse  This visit occurred during the SARS-CoV-2 public health emergency.  Safety protocols were in place, including screening questions prior to the visit, additional usage of staff PPE, and extensive cleaning of exam room while observing appropriate contact time as indicated for disinfecting solutions.    Signed Lamar Blinks, MD

## 2020-08-06 NOTE — Telephone Encounter (Signed)
Who Is Calling Patient / Member / Family / Caregiver Call Type Triage / Clinical Relationship To Patient Self Return Phone Number [phone] (Primary) Chief Complaint Blood Pressure Low Reason for Call Symptomatic / Request for Health Information Initial Comment Caller states she has low blood pressure and light headedness. She states her last BP readings 103/70, 106/75. She states her pulse was 79. Translation No Nurse Assessment Nurse: Fredderick Phenix, RN, Lelan Pons Date/Time Eilene Ghazi Time): 08/06/2020 8:08:45 AM Confirm and document reason for call. If symptomatic, describe symptoms. ---Caller states she has low blood pressure and light headedness. She states her last BP readings 103/70. She states her pulse was 79. Since last Saturday, especially in the morning, she feels light headed. Also feels episodes in the daytime where she feels clammy and hot flashing. Does the patient have any new or worsening symptoms? ---Yes Will a triage be completed? ---Yes Related visit to physician within the last 2 weeks? ---No Does the PT have any chronic conditions? (i.e. diabetes, asthma, this includes High risk factors for pregnancy, etc.) ---Yes List chronic conditions. ---HTN, pacemaker Is this a behavioral health or substance abuse call? ---No

## 2020-08-06 NOTE — Telephone Encounter (Signed)
Patient has appointment today with PCP  °

## 2020-08-06 NOTE — Patient Instructions (Signed)
It was good to see you today- we will be in touch with your labs and will order the Zio patch monitor for you Please hold- do not take- your amlodipine for now; this should allow your BP to come up a bit Drink extra fluids and ok to liberalize your salt intake   If you are getting worse or not doing ok please contact me, seek help at the ER if any major concerns

## 2020-08-07 ENCOUNTER — Encounter: Payer: Self-pay | Admitting: Family Medicine

## 2020-08-07 LAB — COMPREHENSIVE METABOLIC PANEL
ALT: 12 U/L (ref 0–35)
AST: 14 U/L (ref 0–37)
Albumin: 4.2 g/dL (ref 3.5–5.2)
Alkaline Phosphatase: 34 U/L — ABNORMAL LOW (ref 39–117)
BUN: 25 mg/dL — ABNORMAL HIGH (ref 6–23)
CO2: 30 mEq/L (ref 19–32)
Calcium: 9.3 mg/dL (ref 8.4–10.5)
Chloride: 102 mEq/L (ref 96–112)
Creatinine, Ser: 0.86 mg/dL (ref 0.40–1.20)
GFR: 65.08 mL/min (ref >60.00)
Glucose, Bld: 90 mg/dL (ref 70–99)
Potassium: 3.5 mEq/L (ref 3.5–5.1)
Sodium: 140 mEq/L (ref 135–145)
Total Bilirubin: 0.4 mg/dL (ref 0.2–1.2)
Total Protein: 6.5 g/dL (ref 6.0–8.3)

## 2020-08-07 LAB — CBC
HCT: 36.2 % (ref 36.0–46.0)
Hemoglobin: 12.2 g/dL (ref 12.0–15.0)
MCHC: 33.8 g/dL (ref 30.0–36.0)
MCV: 88.2 fl (ref 78.0–100.0)
Platelets: 265 10*3/uL (ref 150.0–400.0)
RBC: 4.11 Mil/uL (ref 3.87–5.11)
RDW: 13.4 % (ref 11.5–15.5)
WBC: 5.3 10*3/uL (ref 4.0–10.5)

## 2020-08-07 LAB — VITAMIN D 25 HYDROXY (VIT D DEFICIENCY, FRACTURES): VITD: 61.59 ng/mL (ref 30.00–100.00)

## 2020-08-07 LAB — TSH: TSH: 0.77 u[IU]/mL (ref 0.35–5.50)

## 2020-08-11 ENCOUNTER — Ambulatory Visit (INDEPENDENT_AMBULATORY_CARE_PROVIDER_SITE_OTHER): Payer: PPO | Admitting: Pharmacist

## 2020-08-11 DIAGNOSIS — M810 Age-related osteoporosis without current pathological fracture: Secondary | ICD-10-CM

## 2020-08-11 DIAGNOSIS — R42 Dizziness and giddiness: Secondary | ICD-10-CM

## 2020-08-11 DIAGNOSIS — F418 Other specified anxiety disorders: Secondary | ICD-10-CM

## 2020-08-11 DIAGNOSIS — F5101 Primary insomnia: Secondary | ICD-10-CM

## 2020-08-11 NOTE — Chronic Care Management (AMB) (Signed)
Chronic Care Management Pharmacy Note  08/14/2020 Name:  Theresa Mejia MRN:  [id number] DOB:  [birth year]  Subjective: Theresa Mejia is an 80 y.o. year old female who is a primary patient of Mejia, Gay Filler, MD.  The CCM team was consulted for assistance with disease management and care coordination needs.    Engaged with patient by telephone for follow up visit in response to provider referral for pharmacy case management and/or care coordination services.   Consent to Services:  The patient was given information about Chronic Care Management services, agreed to services, and gave verbal consent prior to initiation of services.  Please see initial visit note for detailed documentation.   Patient Care Team: Mejia, Gay Filler, MD as PCP - General (Family Medicine) Deneise Lever, MD (Pulmonary Disease) Gaynelle Arabian, MD (Orthopedic Surgery) Richmond Campbell, MD (Gastroenterology) Cherre Robins, PharmD (Pharmacist)  Recent office visits: 08/06/2020 - PCP (Dr Lorelei Pont) Lightheadedness / low BP. Recommended hold amlodipine. CBC, TSH and vit D all WNL. 04/23/20-Theresa Copland, MD PCP-Follow up Neck pain. Referral placed for carotid US.Restart prozac 20 mg, increase to 40 mg after two weeks if desired. Taper off wellbutrin. 04/06/20 Theresa Blinks, MD PCP Video visit- Exposure to [phone].  Recent consult visits: 07/14/2020 - Endo (Dr Lind Guest) f/u mutlinodular goiter. Changed from levothyroxine 113mg 0.5 tablet daily to levothyroxine 761m daily.  02/19/20 - Cardio (Dr TaLovena LeF/U Sick sinus syndrome.  Hospital visits: None in lat 6 months  Objective:  Lab Results  Component Value Date   CREATININE 0.86 08/06/2020   CREATININE 0.77 01/06/2020   CREATININE 0.84 08/01/2019    Lab Results  Component Value Date   HGBA1C 5.3 10/24/2019   Last diabetic Eye exam: No results found for: HMDIABEYEEXA  Last diabetic Foot exam: No results found for: HMDIABFOOTEX       Component Value Date/Time   CHOL 126 10/24/2019 1020   TRIG 91 10/24/2019 1020   HDL 37 (L) 10/24/2019 1020   CHOLHDL 3.4 10/24/2019 1020   VLDL 33.6 10/22/2018 1516   LDLCALC 72 10/24/2019 1020   LDLDIRECT 148.3 09/01/2010 1041    Hepatic Function Latest Ref Rng & Units 08/06/2020 01/06/2020 08/01/2019  Total Protein 6.0 - 8.3 g/dL 6.5 7.0 7.2  Albumin 3.5 - 5.2 g/dL 4.2 4.6 4.4  AST 0 - 37 U/L 14 14 18   ALT 0 - 35 U/L 12 12 17   Alk Phosphatase 39 - 117 U/L 34(L) 42 36(L)  Total Bilirubin 0.2 - 1.2 mg/dL 0.4 0.6 0.7  Bilirubin, Direct 0.0 - 0.3 mg/dL - - -    Lab Results  Component Value Date/Time   TSH 0.77 08/06/2020 04:09 PM   TSH 2.47 04/23/2020 02:43 PM   FREET4 1.06 06/04/2015 02:43 PM    CBC Latest Ref Rng & Units 08/06/2020 01/06/2020 08/01/2019  WBC 4.0 - 10.5 K/uL 5.3 5.3 5.1  Hemoglobin 12.0 - 15.0 g/dL 12.2 13.1 13.4  Hematocrit 36.0 - 46.0 % 36.2 39.3 40.5  Platelets 150.0 - 400.0 K/uL 265.0 248.0 242    Lab Results  Component Value Date/Time   VD25OH 61.59 08/06/2020 04:09 PM   VD25OH 69.71 11/03/2016 11:04 AM    Clinical ASCVD: No  The ASCVD Risk score ([place] et al., 2013) failed to calculate for the following reasons:   The valid total cholesterol range is 130 to 320 mg/dL     Social History   Tobacco Use  Smoking Status Former   Years: 8.00  Types: Cigarettes   Quit date: 01/10/1966   Years since quitting: 54.6  Smokeless Tobacco Never   BP Readings from Last 3 Encounters:  08/06/20 110/70  07/14/20 126/80  04/23/20 128/80   Pulse Readings from Last 3 Encounters:  08/06/20 82  07/14/20 76  04/23/20 77   Wt Readings from Last 3 Encounters:  08/06/20 148 lb 3.2 oz (67.2 kg)  07/14/20 153 lb (69.4 kg)  04/23/20 158 lb (71.7 kg)    Assessment: Review of patient past medical history, allergies, medications, health status, including review of consultants reports, laboratory and other test data, was performed as part of  comprehensive evaluation and provision of chronic care management services.   SDOH:  (Social Determinants of Health) assessments and interventions performed:  SDOH Interventions    Flowsheet Row Most Recent Value  SDOH Interventions   Financial Strain Interventions Intervention Not Indicated  Physical Activity Interventions Intervention Not Indicated  Transportation Interventions Intervention Not Indicated       CCM Care Plan  Allergies  Allergen Reactions   Ramipril     Unknown reaction   Sulfa Antibiotics     Says it dropped her blood pressure really low.    Sulfonamide Derivatives     REACTION: hypotension    Medications Reviewed Today     Reviewed by Cherre Robins, PharmD (Pharmacist) on 08/11/20 at 1500  Med List Status: <None>   Medication Order Taking? Sig Documenting Provider Last Dose Status Informant  calcium carbonate (TUMS - DOSED IN MG ELEMENTAL CALCIUM) 500 MG chewable tablet [id number] Yes Chew 1 tablet by mouth 4 (four) times daily as needed for indigestion or heartburn. [provider] Taking Active Self  Cholecalciferol (VITAMIN D3) 50 MCG (2000 UT) TABS [id number] Yes Take 2,000 Units by mouth daily. [provider] Taking Active Self  clonazePAM (KLONOPIN) 0.5 MG tablet [id number] Yes TAKE 1/2 AT BEDTIME AS NEEDED FOR INSOMNIA, MAY INCREASE TO A WHOLE TABLET IF NEEDED Mejia, Gay Filler, MD Taking Active   FLUoxetine (PROZAC) 20 MG capsule [id number] Yes Take 1 capsule (20 mg total) by mouth daily. Increase to 40 mg after 2 weeks Mejia, Gay Filler, MD Taking Active   fluticasone (FLONASE) 50 MCG/ACT nasal spray [id number] Yes Place 2 sprays into both nostrils daily. Mejia, Gay Filler, MD Taking Active   ipratropium (ATROVENT) 0.03 % nasal spray [id number] No PLACE 2 SPRAYS INTO THE NOSE 4 (FOUR) TIMES DAILY. USE AS NEEDED FOR POST NASAL DRIP  Patient not taking: No sig reported   Mejia, Gay Filler, MD Not Taking Active   levocetirizine (XYZAL)  5 MG tablet [id number] Yes TAKE 1 TABLET BY MOUTH EVERY DAY IN THE EVENING  Patient taking differently: Take 5 mg by mouth every other day.   Mejia, Gay Filler, MD Taking Active   levothyroxine (SYNTHROID) 75 MCG tablet [id number] Yes Take 1 tablet (75 mcg total) by mouth daily. Shamleffer, Melanie Crazier, MD Taking Active   Polyvinyl Alcohol-Povidone [facility] OP) [id number] Yes Place 1 drop into both eyes daily as needed (dry eyes). [provider] Taking Active Self  simvastatin (ZOCOR) 20 MG tablet [id number] Yes Take 1 tablet (20 mg total) by mouth at bedtime. Mejia, Gay Filler, MD Taking Active   telmisartan (MICARDIS) 80 MG tablet [id number] Yes TAKE 1 TABLET BY MOUTH EVERY DAY Mejia, Gay Filler, MD Taking Active             Patient Active Problem List   Diagnosis Date Noted   Osteopenia 01/09/2019  Pansinusitis 03/06/2018   Cough 03/06/2018   Orthostatic hypotension 12/06/2016   Viral URI with cough 04/20/2016   Sinus node dysfunction ([place]) 11/11/2015   Routine general medical examination at a health care facility 06/04/2015   Insomnia with sleep apnea 06/04/2015   Bronchitis, chronic obstructive w acute bronchitis ([place]) 12/10/2014   Lumbar disc disease 09/01/2010   Obstructive sleep apnea 05/31/2007   Hyperlipidemia with target LDL less than 130 02/05/2007   Depression with anxiety 02/05/2007   Allergic rhinitis 02/05/2007   Hypothyroidism 02/02/2007   Essential hypertension 02/02/2007    Immunization History  Administered Date(s) Administered   Influenza Split 10/11/2010, 10/04/2013   Influenza Whole 10/13/2009   Influenza, High Dose Seasonal PF 10/14/2016, 10/22/2017, 10/22/2018   Influenza-Unspecified 10/28/2015, 10/17/2019   PFIZER(Purple Top)SARS-COV-2 Vaccination 01/24/2019, 02/11/2019, 10/03/2019, 04/20/2020   Pneumococcal Conjugate-13 11/06/2014   Pneumococcal Polysaccharide-23 02/25/2008   Td 02/25/2008   Tdap 10/23/2018   Zoster Recombinat  (Shingrix) 04/27/2017, 08/01/2017    Conditions to be addressed/monitored: HTN, HLD, Anxiety, Depression, and OSA; insomnia; lightheadedness; hypothyroidism; osteopenia;   Care Plan : General Pharmacy (Adult)  Updates made by Cherre Robins, PHARMD since 08/14/2020 12:00 AM     Problem: HTN; depression with anxiety and insomnia; OSA; hypothyroidism; osteopenia; hyperlipidemia; sinus node dysfunciton   Priority: High  Onset Date: 08/11/2020     Long-Range Goal: Pharmacy care plan for chronic conditions and medication management   Start Date: 08/11/2020  Priority: High  Note:   Current Barriers:  Experiencing lightheadedness - possibly related to medication adverse effect Has been unable to participate in usual exercise due to lightheadedness   Pharmacist Clinical Goal(s):  Over the next 90 days, patient will maintain control of hypertension as evidenced by BP <140/90  Reviewed medication list and identify potential pharmacotherapy causes of lightheadedness.   through collaboration with PharmD and provider.   Interventions: 1:1 collaboration with Mejia, Gay Filler, MD regarding development and update of comprehensive plan of care as evidenced by provider attestation and co-signature Inter-disciplinary care team collaboration (see longitudinal plan of care) Comprehensive medication review performed; medication list updated in electronic medical record    Hypertension Controlled; BP goal <140/90 Home BP readings: 107/76; 115/88; 103/70 Recent HR: 76-95 Patient continues to experience feeling of lightheadedness with sitting or standing thought does seem to worsen when standing or moving around. She states she does feel that she might fall bu there head "feels foggy" Amlodipine was stopped 08/06/2020 by Dr Lorelei Pont to see if improved but patient reports she does not see a difference. Current regimen:  Telmisartan 24m daily Previous medications: amlodipine recently stopped to see if  lightheadedness improved.  Interventions: Recommended patient to check blood pressure 2 to 3 times per week and record Also check blood pressure when feeling lightheaded Recommended continue to hold amlodipine Continue to take telmisartan Has follow up with cardiology in 2 weeks.   Hyperlipidemia Controlled;  LDL goal < 100 Exercise: non currently but previously was attending exercise classes regularly at local gym. Hopes to restart once lightheadedness resolves.  Current regimen:  Simvastatin 258mdaily Interventions: Discussed LDL goals  Maintain cholesterol medication regimen.   Anxiety / Depression / insomnia: Controlled; Goal:  minimize anxiety and depression symptoms. Also limit potential side effects from medication therapy Current regimen:  Fluoxetine 2051m take 1 tablet daily for 2 weeks, then increase to 2 tablets = 59m71mily (patient report she is only taking once daily  Clonazepam 0.5mg 21make 0.5 tablet at bedtime as  needed for sleep  Fluoxetine was started in May 2022. Its possible that lightheadedness could be related.  Meds tried in past: bupropion, sertraline, Effexor, citalopram (patient was unsure why stopped and no reason for discontinuation documentation available in chart.  Interventions: Discussed timing of onset of lightheadedness and change to fluoxetine - could be related.  Discussed possible alternatives to fluoxetine with Dr Lorelei Pont and patient. Could consider escitalopram 39m daily. If change is made, recommend 7 day washout of fluoxetine and then start escitalopram 155mdaily   Osteopenia Goal: reduce risk of fracture due to osteopenia Last DEXA 01/08/2019: Left Forearm Radius T-score 01/08/2019 =  -2.2  Left Forearm Radius T-score 04/21/2016 =  -1.8  Femur Neck Right T-score 01/08/2019 = -1.8  Femur Neck Right T-score 04/21/2016 = -1.4  Frax 10 Year Risk:   Major Osteoporotic Fracture: 21.7% Hip Fracture:   12.1% Population:  [place] Risk Factors: Family Hist. (Parent hip fracture), Secondary Osteoporosis Current regimen:  Vitamin D 2000 units every day Tums 50036ms needed Interventions: Recommend continue vitamin D and Tums Due to recheck bone density after 01/07/2021  Medication management Pharmacist Clinical Goal(s): Over the next 90 days, patient will work with PharmD and providers to maintain optimal medication adherence [facility] Comprehensive medication review performed. Continue current medication management strategy Patient self care activities - Over the next 90 days, patient will: Focus on medication adherence by filling and taking medications appropriately  Take medications as prescribed Report any questions or concerns to PharmD and/or provider(s)  Patient Goals/Self-Care Activities Over the next 90 days, patient will:  take medications as prescribed, check blood pressure daily , document, and provide at future appointments, and restart regular exercise as able and work up to goal of 150 minutes per week.   Follow Up Plan: Telephone follow up appointment with care management team member scheduled for:  4 weeks with CMA to check lightheadedness and BP   and 6 months with pharmacist unless needed sooner.                 Medication Assistance: None required.  Patient affirms current coverage meets needs.  Patient's preferred pharmacy is:  [address] [place] [place]one: [phone]: [phone] [place]one: [phone]: [phone]/pharmacy #[id number] [place]: [phone]: [phone] Up:  Patient agrees to Care Plan and Follow-up.  Plan:  Telephone follow up appointment with care management team member scheduled for:  pharmacist for 6 months and The care management team will reach out to the patient again over the next 30 days.  TammCherre RobinsarmD Clinical Pharmacist [facility]

## 2020-08-11 NOTE — Patient Instructions (Addendum)
Visit Information  PATIENT GOALS:  Goals Addressed             This Visit's Progress    Chronic Care Management Pharmacy Care Plan       CARE PLAN ENTRY (see longitudinal plan of care for additional care plan information)  Current Barriers:  Chronic Disease Management support, education, and care coordination needs related to HTN, HLD, Depression and anxiety, Allergic Rhinitis, Hypothyroidism, GERD, Vitamin B12 Deficiency, Vitamin D Deficiency, Osteopenia   Hypertension BP Readings from Last 3 Encounters:  08/06/20 110/70  07/14/20 126/80  04/23/20 128/80  Pharmacist Clinical Goal(s): Over the next 90 days, patient will work with PharmD and providers to maintain BP goal <140/90 Current regimen:  Telmisartan '80mg'$  daily Interventions: Requested patient to check blood pressure 2 to 3 times per week and record Also check blood pressure when feeling lightheaded Patient self care activities - Over the next 90 days, patient will: Check blood pressure 2 to 3 times per week and when feeling lightheaded, document, and provide at future appointments Ensure daily salt intake < 2300 mg/day  Hyperlipidemia Lab Results  Component Value Date/Time   LDLCALC 72 10/24/2019 10:20 AM   LDLDIRECT 148.3 09/01/2010 10:41 AM  Pharmacist Clinical Goal(s): Over the next 90 days, patient will work with PharmD and providers to achieve LDL goal < 100 Current regimen:  Simvastatin '20mg'$  daily Interventions: Discussed LDL goals  Patient self care activities - Over the next 90 days, patient will: Maintain cholesterol medication regimen.   Anxiety / Depression: Pharmacist Clinical Goal(s): Over the next 90 days, patient will work with PharmD and providers to minimize anxiety and depression symptoms. Also limit potential side effects from medication therapy Current regimen:  Fluoxetine '20mg'$  - take 1 tablet daily for 2 weeks, then increase to 2 tablets = '40mg'$  daily (patient report she is only taking  once daily  Clonazepam 0.'5mg'$  - take 0.5 tablet at bedtime as needed for sleep  Interventions: Discussed timing of onset of lightheadedness and change to fluoxetine - could be related.  Discussed possible alternative to fluoxetine with Dr Lorelei Pont and patient. Consider escitalopram / Lexapro '10mg'$  daily  Patient self care activities - Over the next 90 days, patient will: Continue to take fluoxetine '20mg'$  daily Will contact you if Dr Lorelei Pont changes therapy  Osteopenia Pharmacist Clinical Goal(s) Over the next 90 days, patient will work with PharmD and providers to reduce risk of fracture due to osteopenia Current regimen:  Vitamin D 2000 units every day Tums '500mg'$  as needed Interventions: Discussed option of Prolia to replace fosamax (at previous visit) Patient self care activities - Over the next 90 days, patient will: Continue vitamin D and Tums Due to recheck bone density after 01/07/2021  Medication management Pharmacist Clinical Goal(s): Over the next 90 days, patient will work with PharmD and providers to maintain optimal medication adherence [facility] Comprehensive medication review performed. Continue current medication management strategy Patient self care activities - Over the next 90 days, patient will: Focus on medication adherence by filling and taking medications appropriately  Take medications as prescribed Report any questions or concerns to PharmD and/or provider(s)  Please see past updates related to this goal by clicking on the "Past Updates" button in the selected goal           Patient verbalizes understanding of instructions provided today and agrees to view in [place].   The care management team will reach out to the patient again over the next 30  days.  Clinical pharmacist follow up in 6 months unless needed sooner.   Cherre Robins, PharmD Clinical Pharmacist [place] [facility]

## 2020-08-13 ENCOUNTER — Encounter: Payer: Self-pay | Admitting: Family Medicine

## 2020-08-19 ENCOUNTER — Ambulatory Visit (INDEPENDENT_AMBULATORY_CARE_PROVIDER_SITE_OTHER): Payer: PPO

## 2020-08-19 DIAGNOSIS — I495 Sick sinus syndrome: Secondary | ICD-10-CM

## 2020-08-19 DIAGNOSIS — M25552 Pain in left hip: Secondary | ICD-10-CM | POA: Diagnosis not present

## 2020-08-20 LAB — CUP PACEART REMOTE DEVICE CHECK
Date Time Interrogation Session: 20220808115640
Implantable Lead Implant Date: 20171101
Implantable Lead Implant Date: 20171101
Implantable Lead Location: 753859
Implantable Lead Location: 753860
Implantable Lead Model: 377
Implantable Lead Model: 377
Implantable Lead Serial Number: 49553678
Implantable Lead Serial Number: 49617393
Implantable Pulse Generator Implant Date: 20171101
Pulse Gen Model: 394969
Pulse Gen Serial Number: 68817609

## 2020-08-25 ENCOUNTER — Ambulatory Visit: Payer: PPO | Admitting: Internal Medicine

## 2020-08-25 ENCOUNTER — Other Ambulatory Visit: Payer: Self-pay

## 2020-08-25 VITALS — BP 120/72 | HR 70 | Ht 60.0 in | Wt 147.4 lb

## 2020-08-25 DIAGNOSIS — I495 Sick sinus syndrome: Secondary | ICD-10-CM | POA: Diagnosis not present

## 2020-08-25 DIAGNOSIS — Z950 Presence of cardiac pacemaker: Secondary | ICD-10-CM

## 2020-08-25 DIAGNOSIS — I10 Essential (primary) hypertension: Secondary | ICD-10-CM

## 2020-08-25 NOTE — Progress Notes (Signed)
HPI Mrs. Frybarger returns today for ongoing evaluation of palpitations and chronotropic incompetence, s/p PPM insertion. She is a pleasant 80 yo woman who c/o fatigue, weakness and palpitations, and average HR of 48/min. She underwent PPM insertion several years ago. She has been stable in the interim except she complains of experiencing lightheadedness when she stands. She denies chest pain.  Allergies  Allergen Reactions   Ramipril     Unknown reaction   Sulfa Antibiotics     Says it dropped her blood pressure really low.    Sulfonamide Derivatives     REACTION: hypotension     Current Outpatient Medications  Medication Sig Dispense Refill   buPROPion (WELLBUTRIN SR) 150 MG 12 hr tablet Take 1 tablet by mouth daily.     calcium carbonate (TUMS - DOSED IN MG ELEMENTAL CALCIUM) 500 MG chewable tablet Chew 1 tablet by mouth 4 (four) times daily as needed for indigestion or heartburn.     Cholecalciferol (VITAMIN D3) 50 MCG (2000 UT) TABS Take 2,000 Units by mouth daily.     clonazePAM (KLONOPIN) 0.5 MG tablet TAKE 1/2 AT BEDTIME AS NEEDED FOR INSOMNIA, MAY INCREASE TO A WHOLE TABLET IF NEEDED 30 tablet 1   FLUoxetine (PROZAC) 20 MG capsule Take 1 capsule (20 mg total) by mouth daily. Increase to 40 mg after 2 weeks 180 capsule 3   fluticasone (FLONASE) 50 MCG/ACT nasal spray Place 2 sprays into both nostrils daily. 48 mL 3   ipratropium (ATROVENT) 0.03 % nasal spray PLACE 2 SPRAYS INTO THE NOSE 4 (FOUR) TIMES DAILY. USE AS NEEDED FOR POST NASAL DRIP 30 mL 6   levocetirizine (XYZAL) 5 MG tablet TAKE 1 TABLET BY MOUTH EVERY DAY IN THE EVENING 90 tablet 1   levothyroxine (SYNTHROID) 75 MCG tablet Take 1 tablet (75 mcg total) by mouth daily. 90 tablet 3   Polyvinyl Alcohol-Povidone (REFRESH OP) Place 1 drop into both eyes daily as needed (dry eyes).     simvastatin (ZOCOR) 20 MG tablet Take 1 tablet (20 mg total) by mouth at bedtime. 90 tablet 1   telmisartan (MICARDIS) 80 MG tablet TAKE  1 TABLET BY MOUTH EVERY DAY 90 tablet 1   No current facility-administered medications for this visit.     Past Medical History:  Diagnosis Date   Allergic rhinitis    Depression    DJD (degenerative joint disease)    GERD (gastroesophageal reflux disease)    Headache(784.0)    sinus   Hypercholesteremia    Hypersomnia    Hypertension    Hypothyroidism    Internal hemorrhoid 11/2008   s/p banding ([place])   Lumbar disc disease    OBSTRUCTIVE SLEEP APNEA    NPSG 04/29/07- AHI 1.1/hr, RDI 21.2/hr. Weight 176 lbs. CPAP was tried based on the RDI.     OVERACTIVE BLADDER    pt unaware   Presence of permanent cardiac pacemaker 11/11/2015   Reactive depression (situational)    Sinus node dysfunction (HCC)    Spondylosis, cervical     ROS:   All systems reviewed and negative except as noted in the HPI.   Past Surgical History:  Procedure Laterality Date   BIOPSY BREAST     BREAST EXCISIONAL BIOPSY Left 2011   CATARACT EXTRACTION W/ INTRAOCULAR LENS IMPLANT Right    COLONOSCOPY     EP IMPLANTABLE DEVICE N/A 11/11/2015   Procedure: Pacemaker Implant;  Surgeon: Evans Lance, MD;  Location: [facility];  Service: Cardiovascular;  Laterality: N/A;   eye lid lift     INSERT / REPLACE / REMOVE PACEMAKER  11/11/2015   KNEE ARTHROSCOPY     Dr. [name]   MOUTH SURGERY  08/25/2017   implant fell out due to bone loss   REVERSE SHOULDER ARTHROPLASTY Right 08/08/2019   Procedure: REVERSE SHOULDER ARTHROPLASTY;  Surgeon: Tania Ade, MD;  Location: [facility];  Service: Orthopedics;  Laterality: Right;   rotator cuff surgery Right    Dr. [name]   TUBAL LIGATION       Family History  Problem Relation Age of Onset   Crohn's disease Father 33   Macular degeneration Mother    Cancer Mother 53       bladder, never confirmed with biospy   Hypothyroidism Mother    Tremor Mother    Stroke Mother 22   Parkinsonism Maternal Grandmother    Alzheimer's disease Paternal Aunt     Alzheimer's disease Paternal Uncle    Diabetes Paternal Grandmother    Colon cancer Neg Hx    Esophageal cancer Neg Hx    Rectal cancer Neg Hx      Social History   Socioeconomic History   Marital status: Married    Spouse name: Not on file   Number of children: 2   Years of education: Not on file   Highest education level: Not on file  Occupational History   Occupation: Solicitor radiology-retire    Employer: [employer]    Comment: data entry  Tobacco Use   Smoking status: Former    Years: 8.00    Types: Cigarettes    Quit date: 01/10/1966    Years since quitting: 29.6   Smokeless tobacco: Never  Vaping Use   Vaping Use: Never used  Substance and Sexual Activity   Alcohol use: No   Drug use: No   Sexual activity: Never    Birth control/protection: Post-menopausal  Other Topics Concern   Not on file  Social History Narrative   married, lives with spouse -   Retired 05/2013   Social Determinants of Health   Financial Resource Strain: Low Risk    Difficulty of Paying Living Expenses: Not hard at all  Food Insecurity: Not on file  Transportation Needs: No Transportation Needs   Lack of Transportation (Medical): No   Lack of Transportation (Non-Medical): No  Physical Activity: Sufficiently Active   Days of Exercise per Week: 4 days   Minutes of Exercise per Session: 40 min  Stress: Not on file  Social Connections: Not on file  Intimate Partner Violence: Not on file     BP 120/72   Pulse 70   Ht 5' (1.524 m)   Wt 147 lb 6.4 oz (66.9 kg)   SpO2 98%   BMI 28.79 kg/m   Physical Exam:  Well appearing NAD HEENT: Unremarkable Neck:  No JVD, no thyromegally Lymphatics:  No adenopathy Back:  No CVA tenderness Lungs:  Clear with no wheezes HEART:  Regular rate rhythm, no murmurs, no rubs, no clicks Abd:  soft, positive bowel sounds, no organomegally, no rebound, no guarding Ext:  2 plus pulses, no edema, no cyanosis, no clubbing Skin:  No rashes no  nodules Neuro:  CN II through XII intact, motor grossly intact  DEVICE  Normal device function.  See PaceArt for details.   Assess/Plan:  1. Sinus node dysfunction - she is stable and asymptomatic, s/p PPM insertion. 2. PPM - her Biotronik DDD PM is working  normally. Will recheck in several months. 3. HTN - her BP is fairly well controlled. No change.  4. Lightheadedness - the etiology is unclear. I asked her to check her bp when she gets up and experiences lightheadedness. If her bp is low, we will stop her ACE inhibitor.  Royston Sinner Garlin [place]dorf,M.D.

## 2020-08-25 NOTE — Patient Instructions (Addendum)
Medication Instructions:  Your physician recommends that you continue on your current medications as directed. Please refer to the Current Medication list given to you today.  Labwork: None ordered.  Testing/Procedures: None ordered.  Follow-Up: Your physician wants you to follow-up in: one year with Cristopher Peru, MD or one of the following Advanced Practice Providers on your designated Care Team:   Tommye Standard, [place] Legrand Como "Jonni Sanger" Chalmers Cater, [place]  Remote monitoring is used to monitor your Pacemaker from home. This monitoring reduces the number of office visits required to check your device to one time per year. It allows [name] to keep an eye on the functioning of your device to ensure it is working properly. You are scheduled for a device check from home on 11/18/2020. You may send your transmission at any time that day. If you have a wireless device, the transmission will be sent automatically. After your physician reviews your transmission, you will receive a postcard with your next transmission date.  Any Other Special Instructions Will Be Listed Below (If Applicable).  Check your blood pressure when you feel lightheaded.  You can send that information to [name] via [place].  If you need a refill on your cardiac medications before your next appointment, please call your pharmacy.

## 2020-09-06 ENCOUNTER — Encounter: Payer: Self-pay | Admitting: Family Medicine

## 2020-09-08 ENCOUNTER — Telehealth: Payer: Self-pay | Admitting: Internal Medicine

## 2020-09-08 MED ORDER — METOPROLOL SUCCINATE ER 25 MG PO TB24: 12.5000 mg | ORAL_TABLET | Freq: Every day | ORAL | 3 refills | Status: DC

## 2020-09-08 NOTE — Telephone Encounter (Signed)
   Pt is calling to f/u her mychart message to Sonia Baller, she asked if Sonia Baller can call her back to day. She said she is more concern about her BP today

## 2020-09-08 NOTE — Telephone Encounter (Signed)
See my chart message

## 2020-09-09 ENCOUNTER — Other Ambulatory Visit: Payer: Self-pay

## 2020-09-09 ENCOUNTER — Ambulatory Visit: Payer: PPO | Admitting: Podiatry

## 2020-09-09 DIAGNOSIS — L600 Ingrowing nail: Secondary | ICD-10-CM | POA: Diagnosis not present

## 2020-09-09 NOTE — Progress Notes (Signed)
   Subjective: Patient presents today for evaluation of pain to the medial border bilateral great toes. Patient is concerned for possible ingrown nail.  It is very sensitive to touch.  Patient presents today for further treatment and evaluation.  Past Medical History:  Diagnosis Date   Allergic rhinitis    Depression    DJD (degenerative joint disease)    GERD (gastroesophageal reflux disease)    Headache(784.0)    sinus   Hypercholesteremia    Hypersomnia    Hypertension    Hypothyroidism    Internal hemorrhoid 11/2008   s/p banding ([facility])   Lumbar disc disease    OBSTRUCTIVE SLEEP APNEA    NPSG 04/29/07- AHI 1.1/hr, RDI 21.2/hr. Weight 176 lbs. CPAP was tried based on the RDI.     OVERACTIVE BLADDER    pt unaware   Presence of permanent cardiac pacemaker 11/11/2015   Reactive depression (situational)    Sinus node dysfunction (HCC)    Spondylosis, cervical     Objective:  General: Well developed, nourished, in no acute distress, alert and oriented x3   Dermatology: Skin is warm, dry and supple bilateral. Medial border bilateral great toes appears to be erythematous with evidence of an ingrowing nail. Pain on palpation noted to the border of the nail fold. The remaining nails appear unremarkable at this time. There are no open sores, lesions.  Vascular: Dorsalis Pedis artery and Posterior Tibial artery pedal pulses palpable. No lower extremity edema noted.   Neruologic: Grossly intact via light touch bilateral.  Musculoskeletal: Muscular strength within normal limits in all groups bilateral. Normal range of motion noted to all pedal and ankle joints.   Assesement: #1 Paronychia with ingrowing nail medial border bilateral great toes #2 Pain in toe  Plan of Care:  1. Patient evaluated.  2. Discussed treatment alternatives and plan of care. Explained nail avulsion procedure and post procedure course to patient. 3. Patient opted for permanent partial nail avulsion of the  ingrown portion of the nail.  4. Prior to procedure, local anesthesia infiltration utilized using 3 ml of a 50:50 mixture of 2% plain lidocaine and 0.5% plain marcaine in a normal hallux block fashion and a betadine prep performed.  5. Partial permanent nail avulsion with chemical matrixectomy performed using [id number] applications of phenol followed by alcohol flush.  6. Light dressing applied.  Post care instructions provided 7.  Recommend triple antibiotic abx daily 8.  Return to clinic 2 weeks.  Edrick Kins, DPM [facility]  Dr. Edrick Kins, DPM    [address], [place] [address]                Office [phone]  Fax [phone]

## 2020-09-09 NOTE — Patient Instructions (Signed)

## 2020-09-10 ENCOUNTER — Other Ambulatory Visit (INDEPENDENT_AMBULATORY_CARE_PROVIDER_SITE_OTHER): Payer: PPO

## 2020-09-10 DIAGNOSIS — E042 Nontoxic multinodular goiter: Secondary | ICD-10-CM

## 2020-09-10 LAB — TSH: TSH: 0.81 u[IU]/mL (ref 0.35–5.50)

## 2020-09-11 NOTE — Progress Notes (Signed)
Remote pacemaker transmission.   

## 2020-09-22 ENCOUNTER — Other Ambulatory Visit: Payer: Self-pay | Admitting: Family Medicine

## 2020-09-22 DIAGNOSIS — E785 Hyperlipidemia, unspecified: Secondary | ICD-10-CM

## 2020-09-23 ENCOUNTER — Other Ambulatory Visit: Payer: Self-pay

## 2020-09-23 ENCOUNTER — Ambulatory Visit: Payer: PPO | Admitting: Podiatry

## 2020-09-23 DIAGNOSIS — L600 Ingrowing nail: Secondary | ICD-10-CM

## 2020-09-23 NOTE — Progress Notes (Signed)
   Subjective: 80 y.o. female presents today status post permanent nail avulsion procedure of the medial border bilateral great toes that was performed on 09/09/2020.  Patient states that she is feeling much better.  No pain associated to the area.  She has been soaking her foot and applying antibiotic ointment as an instructed.  Past Medical History:  Diagnosis Date   Allergic rhinitis    Depression    DJD (degenerative joint disease)    GERD (gastroesophageal reflux disease)    Headache(784.0)    sinus   Hypercholesteremia    Hypersomnia    Hypertension    Hypothyroidism    Internal hemorrhoid 11/2008   s/p banding ([facility])   Lumbar disc disease    OBSTRUCTIVE SLEEP APNEA    NPSG 04/29/07- AHI 1.1/hr, RDI 21.2/hr. Weight 176 lbs. CPAP was tried based on the RDI.     OVERACTIVE BLADDER    pt unaware   Presence of permanent cardiac pacemaker 11/11/2015   Reactive depression (situational)    Sinus node dysfunction (HCC)    Spondylosis, cervical     Objective: Skin is warm, dry and supple. Nail and respective nail fold appears to be healing appropriately. Open wound to the associated nail fold with a granular wound base and moderate amount of fibrotic tissue. Minimal drainage noted. Mild erythema around the periungual region likely due to phenol chemical matricectomy.  Assessment: #1 s/p partial permanent nail matrixectomy medial border bilateral great toes   Plan of care: #1 patient was evaluated  #2 light debridement of open wound was performed to the periungual border of the respective toe using a currette. Antibiotic ointment and Band-Aid was applied. #3 patient is to return to clinic on a PRN basis.     Edrick Kins, DPM [facility]  Dr. Edrick Kins, DPM    [address], [place] [address]                Office [phone]  Fax [phone]

## 2020-09-30 ENCOUNTER — Telehealth: Payer: Self-pay | Admitting: Pharmacist

## 2020-09-30 NOTE — Chronic Care Management (AMB) (Signed)
    Chronic Care Management Pharmacy Assistant   Name: JERNEE MURTAUGH  MRN: [id number] DOB: 1940/10/26  Reason for Encounter: Disease State General  Recent office visits:  None noted  Recent consult visits:  09/23/20 (Podiatry) Edrick Kins, DPM. Seen for ingrown toe nail. A light debridement of open wound was performed to the periungual border of the respective toe using a currette. Antibiotic ointment and Band-Aid was applied. Patient is to return to clinic on a PRN basis. 09/09/20 (Podiatry) Edrick Kins, DPM. Seen for ingrown toe nail. Patient had permanent partial nail avulsion of the ingrown nail. Recommend triple antibiotic daily. Follow up in 2 weeks. 08/25/20 (Cardiology) Champ Mungo. Lovena Le, MD. Seen for general follow up.  Hospital visits:  None in previous 6 months  Medications: Outpatient Encounter Medications as of 09/30/2020  Medication Sig   buPROPion (WELLBUTRIN SR) 150 MG 12 hr tablet Take 1 tablet by mouth daily.   calcium carbonate (TUMS - DOSED IN MG ELEMENTAL CALCIUM) 500 MG chewable tablet Chew 1 tablet by mouth 4 (four) times daily as needed for indigestion or heartburn.   Cholecalciferol (VITAMIN D3) 50 MCG (2000 UT) TABS Take 2,000 Units by mouth daily.   clonazePAM (KLONOPIN) 0.5 MG tablet TAKE 1/2 AT BEDTIME AS NEEDED FOR INSOMNIA, MAY INCREASE TO A WHOLE TABLET IF NEEDED   FLUoxetine (PROZAC) 20 MG capsule Take 1 capsule (20 mg total) by mouth daily. Increase to 40 mg after 2 weeks   fluticasone (FLONASE) 50 MCG/ACT nasal spray Place 2 sprays into both nostrils daily.   ipratropium (ATROVENT) 0.03 % nasal spray PLACE 2 SPRAYS INTO THE NOSE 4 (FOUR) TIMES DAILY. USE AS NEEDED FOR POST NASAL DRIP   levocetirizine (XYZAL) 5 MG tablet TAKE 1 TABLET BY MOUTH EVERY DAY IN THE EVENING   levothyroxine (SYNTHROID) 75 MCG tablet Take 1 tablet (75 mcg total) by mouth daily.   metoprolol succinate (TOPROL XL) 25 MG 24 hr tablet Take 0.5 tablets (12.5 mg total) by mouth  daily.   Polyvinyl Alcohol-Povidone (REFRESH OP) Place 1 drop into both eyes daily as needed (dry eyes).   simvastatin (ZOCOR) 20 MG tablet TAKE 1 TABLET BY MOUTH EVERYDAY AT BEDTIME   No facility-administered encounter medications on file as of 09/30/2020.   Have you had any problems recently with your health? Patient states she has experienced more heart burn, but has been taking famotidine and has helped.  Have you had any problems with your pharmacy? Patient states she has no problems with her pharmacy at this time.  What issues or side effects are you having with your medications? Patient states she has no issues or side effects to her medications.  What would you like me to pass along to Tat Momoli for them to help you with?  Patient staes there has been changes with her levothyroxine it has been doubled on her dosage and blood pressure medication has been stopped amlodipine and Telmisartan and started on metoprolol. Patient states she has been having a lot of heart burn and has started famotidine and has helped with her heartburn. Patient states she has felt a lot better since changing her fluoxitine at night, and has experienced less lightheaded symptom.  What can we do to take care of you better? Patient states there is nothing at this time.  Star Rating Drugs: Simvastatin 20 mg Last filled:09/27/20 90 DS  Myriam Elta [place], [place]

## 2020-10-04 ENCOUNTER — Other Ambulatory Visit: Payer: Self-pay | Admitting: Family Medicine

## 2020-11-09 DIAGNOSIS — Z961 Presence of intraocular lens: Secondary | ICD-10-CM | POA: Diagnosis not present

## 2020-11-09 DIAGNOSIS — H04123 Dry eye syndrome of bilateral lacrimal glands: Secondary | ICD-10-CM | POA: Diagnosis not present

## 2020-11-09 DIAGNOSIS — H2512 Age-related nuclear cataract, left eye: Secondary | ICD-10-CM | POA: Diagnosis not present

## 2020-11-09 DIAGNOSIS — H52203 Unspecified astigmatism, bilateral: Secondary | ICD-10-CM | POA: Diagnosis not present

## 2020-11-10 ENCOUNTER — Other Ambulatory Visit: Payer: Self-pay

## 2020-11-10 ENCOUNTER — Ambulatory Visit: Payer: PPO | Admitting: Internal Medicine

## 2020-11-10 ENCOUNTER — Encounter: Payer: Self-pay | Admitting: Internal Medicine

## 2020-11-10 VITALS — Ht 60.0 in | Wt 147.2 lb

## 2020-11-10 DIAGNOSIS — Z950 Presence of cardiac pacemaker: Secondary | ICD-10-CM

## 2020-11-10 DIAGNOSIS — I10 Essential (primary) hypertension: Secondary | ICD-10-CM | POA: Diagnosis not present

## 2020-11-10 DIAGNOSIS — I495 Sick sinus syndrome: Secondary | ICD-10-CM

## 2020-11-10 NOTE — Progress Notes (Signed)
HPI Theresa Mejia returns today for ongoing evaluation of palpitations and chronotropic incompetence, s/p PPM insertion. She is a pleasant 80 yo woman who c/o fatigue, weakness and palpitations, and average HR of 48/min. She underwent PPM insertion several years ago. She has been stable in the interim except she complains of experiencing lightheadedness when she stands. She denies chest pain. She notes some mild uri symptoms.  Allergies  Allergen Reactions   Ramipril     Unknown reaction   Sulfa Antibiotics     Says it dropped her blood pressure really low.    Sulfonamide Derivatives     REACTION: hypotension     Current Outpatient Medications  Medication Sig Dispense Refill   buPROPion (WELLBUTRIN SR) 150 MG 12 hr tablet Take 1 tablet by mouth daily.     calcium carbonate (TUMS - DOSED IN MG ELEMENTAL CALCIUM) 500 MG chewable tablet Chew 1 tablet by mouth 4 (four) times daily as needed for indigestion or heartburn.     Cholecalciferol (VITAMIN D3) 50 MCG (2000 UT) TABS Take 2,000 Units by mouth daily.     clonazePAM (KLONOPIN) 0.5 MG tablet TAKE 1/2 AT BEDTIME AS NEEDED FOR INSOMNIA, MAY INCREASE TO A WHOLE TABLET IF NEEDED 30 tablet 1   FLUoxetine (PROZAC) 20 MG capsule Take 1 capsule (20 mg total) by mouth daily. Increase to 40 mg after 2 weeks 180 capsule 3   fluticasone (FLONASE) 50 MCG/ACT nasal spray Place 2 sprays into both nostrils daily. 48 mL 3   ipratropium (ATROVENT) 0.03 % nasal spray PLACE 2 SPRAYS INTO THE NOSE 4 (FOUR) TIMES DAILY. USE AS NEEDED FOR POST NASAL DRIP 30 mL 6   levocetirizine (XYZAL) 5 MG tablet TAKE 1 TABLET BY MOUTH EVERY DAY IN THE EVENING 90 tablet 1   levothyroxine (SYNTHROID) 75 MCG tablet Take 1 tablet (75 mcg total) by mouth daily. 90 tablet 3   metoprolol succinate (TOPROL XL) 25 MG 24 hr tablet Take 0.5 tablets (12.5 mg total) by mouth daily. 45 tablet 3   Polyvinyl Alcohol-Povidone (REFRESH OP) Place 1 drop into both eyes daily as needed (dry  eyes).     simvastatin (ZOCOR) 20 MG tablet TAKE 1 TABLET BY MOUTH EVERYDAY AT BEDTIME 90 tablet 1   No current facility-administered medications for this visit.     Past Medical History:  Diagnosis Date   Allergic rhinitis    Depression    DJD (degenerative joint disease)    GERD (gastroesophageal reflux disease)    Headache(784.0)    sinus   Hypercholesteremia    Hypersomnia    Hypertension    Hypothyroidism    Internal hemorrhoid 11/2008   s/p banding ([facility])   Lumbar disc disease    OBSTRUCTIVE SLEEP APNEA    NPSG 04/29/07- AHI 1.1/hr, RDI 21.2/hr. Weight 176 lbs. CPAP was tried based on the RDI.     OVERACTIVE BLADDER    pt unaware   Presence of permanent cardiac pacemaker 11/11/2015   Reactive depression (situational)    Sinus node dysfunction (HCC)    Spondylosis, cervical     ROS:   All systems reviewed and negative except as noted in the HPI.   Past Surgical History:  Procedure Laterality Date   BIOPSY BREAST     BREAST EXCISIONAL BIOPSY Left 2011   CATARACT EXTRACTION W/ INTRAOCULAR LENS IMPLANT Right    COLONOSCOPY     EP IMPLANTABLE DEVICE N/A 11/11/2015   Procedure: Pacemaker Implant;  Surgeon:  Evans Lance, MD;  Location: [facility];  Service: Cardiovascular;  Laterality: N/A;   eye lid lift     INSERT / REPLACE / REMOVE PACEMAKER  11/11/2015   KNEE ARTHROSCOPY     Dr. [name]   MOUTH SURGERY  08/25/2017   implant fell out due to bone loss   REVERSE SHOULDER ARTHROPLASTY Right 08/08/2019   Procedure: REVERSE SHOULDER ARTHROPLASTY;  Surgeon: Tania Ade, MD;  Location: [facility];  Service: Orthopedics;  Laterality: Right;   rotator cuff surgery Right    Dr. [name]   TUBAL LIGATION       Family History  Problem Relation Age of Onset   Crohn's disease Father 73   Macular degeneration Mother    Cancer Mother 13       bladder, never confirmed with biospy   Hypothyroidism Mother    Tremor Mother    Stroke Mother 74   Parkinsonism  Maternal Grandmother    Alzheimer's disease Paternal Aunt    Alzheimer's disease Paternal Uncle    Diabetes Paternal Grandmother    Colon cancer Neg Hx    Esophageal cancer Neg Hx    Rectal cancer Neg Hx      Social History   Socioeconomic History   Marital status: Married    Spouse name: Not on file   Number of children: 2   Years of education: Not on file   Highest education level: Not on file  Occupational History   Occupation: Solicitor radiology-retire    Employer: [employer]    Comment: data entry  Tobacco Use   Smoking status: Former    Years: 8.00    Types: Cigarettes    Quit date: 01/10/1966    Years since quitting: 54.8   Smokeless tobacco: Never  Vaping Use   Vaping Use: Never used  Substance and Sexual Activity   Alcohol use: No   Drug use: No   Sexual activity: Never    Birth control/protection: Post-menopausal  Other Topics Concern   Not on file  Social History Narrative   married, lives with spouse -   Retired 05/2013   Social Determinants of Health   Financial Resource Strain: Low Risk    Difficulty of Paying Living Expenses: Not hard at all  Food Insecurity: Not on file  Transportation Needs: No Transportation Needs   Lack of Transportation (Medical): No   Lack of Transportation (Non-Medical): No  Physical Activity: Sufficiently Active   Days of Exercise per Week: 4 days   Minutes of Exercise per Session: 40 min  Stress: Not on file  Social Connections: Not on file  Intimate Partner Violence: Not on file     Ht 5' (1.524 m)   Wt 147 lb 3.2 oz (66.8 kg)   SpO2 97%   BMI 28.75 kg/m   Physical Exam:  Well appearing NAD HEENT: Unremarkable Neck:  No JVD, no thyromegally Lymphatics:  No adenopathy Back:  No CVA tenderness Lungs:  Clear with no wheezes HEART:  Regular rate rhythm, no murmurs, no rubs, no clicks Abd:  soft, positive bowel sounds, no organomegally, no rebound, no guarding Ext:  2 plus pulses, no edema, no cyanosis,  no clubbing Skin:  No rashes no nodules Neuro:  CN II through XII intact, motor grossly intact  DEVICE  Normal device function.  See PaceArt for details.   Assess/Plan:   1. Sinus node dysfunction - she is stable and asymptomatic, s/p PPM insertion. 2. PPM - her Biotronik  DDD PM is working normally. Will recheck in several months. 3. HTN - her BP is fairly well controlled. No change.  4. Lightheadedness - the etiology is unclear. I asked her to check her bp when she gets up and experiences lightheadedness. If her bp is low, we will stop her ACE inhibitor.   Theresa Mejia Jerome Otter,MD

## 2020-11-10 NOTE — Patient Instructions (Addendum)
Medication Instructions:  Your physician recommends that you continue on your current medications as directed. Please refer to the Current Medication list given to you today.  Labwork: None ordered.  Testing/Procedures: None ordered.  Follow-Up: Your physician wants you to follow-up in: one year with Theresa Peru, MD   Remote monitoring is used to monitor your Pacemaker from home. This monitoring reduces the number of office visits required to check your device to one time per year. It allows [name] to keep an eye on the functioning of your device to ensure it is working properly. You are scheduled for a device check from home on 11/18/2020. You may send your transmission at any time that day. If you have a wireless device, the transmission will be sent automatically. After your physician reviews your transmission, you will receive a postcard with your next transmission date.  Any Other Special Instructions Will Be Listed Below (If Applicable).  If you need a refill on your cardiac medications before your next appointment, please call your pharmacy.

## 2020-11-11 DIAGNOSIS — M25552 Pain in left hip: Secondary | ICD-10-CM | POA: Insufficient documentation

## 2020-11-12 DIAGNOSIS — M5459 Other low back pain: Secondary | ICD-10-CM | POA: Diagnosis not present

## 2020-11-12 DIAGNOSIS — M25552 Pain in left hip: Secondary | ICD-10-CM | POA: Diagnosis not present

## 2020-11-17 ENCOUNTER — Ambulatory Visit (INDEPENDENT_AMBULATORY_CARE_PROVIDER_SITE_OTHER): Payer: PPO

## 2020-11-17 ENCOUNTER — Other Ambulatory Visit (HOSPITAL_COMMUNITY): Payer: Self-pay | Admitting: Orthopedic Surgery

## 2020-11-17 ENCOUNTER — Encounter: Payer: Self-pay | Admitting: Family Medicine

## 2020-11-17 VITALS — Ht 60.0 in | Wt 147.0 lb

## 2020-11-17 DIAGNOSIS — Z0000 Encounter for general adult medical examination without abnormal findings: Secondary | ICD-10-CM | POA: Diagnosis not present

## 2020-11-17 DIAGNOSIS — M545 Low back pain, unspecified: Secondary | ICD-10-CM

## 2020-11-17 DIAGNOSIS — Z780 Asymptomatic menopausal state: Secondary | ICD-10-CM | POA: Diagnosis not present

## 2020-11-17 DIAGNOSIS — Z1231 Encounter for screening mammogram for malignant neoplasm of breast: Secondary | ICD-10-CM | POA: Diagnosis not present

## 2020-11-17 NOTE — Progress Notes (Addendum)
Subjective:   Theresa Mejia is a 80 y.o. female who presents for Medicare Annual (Subsequent) preventive examination.  I connected with Shirelle today by telephone and verified that I am speaking with the correct person using two identifiers. Location patient: home Location provider: work Persons participating in the virtual visit: patient, Marine scientist.    I discussed the limitations, risks, security and privacy concerns of performing an evaluation and management service by telephone and the availability of in person appointments. I also discussed with the patient that there may be a patient responsible charge related to this service. The patient expressed understanding and verbally consented to this telephonic visit.    Interactive audio and video telecommunications were attempted between this provider and patient, however failed, due to patient having technical difficulties OR patient did not have access to video capability.  We continued and completed visit with audio only.  Some vital signs may be absent or patient reported.   Time Spent with patient on telephone encounter: 20 minutes   Review of Systems     Cardiac Risk Factors include: advanced age (>70men, >63 women);hypertension;dyslipidemia     Objective:    Today's Vitals   11/17/20 1140 11/17/20 1141  Weight: 147 lb (66.7 kg)   Height: 5' (1.524 m)   PainSc:  1    Body mass index is 28.71 kg/m.  Advanced Directives 11/17/2020 08/01/2019 11/19/2018 11/14/2017 11/03/2016 11/11/2015 06/08/2015  Does Patient Have a Medical Advance Directive? Yes Yes Yes Yes Yes Yes Yes  Type of Paramedic of [place];Living will [place];Living will [place];Living will [place];Living will [place];Living will - [place];Living will  Does patient want to make changes to medical advance directive? - No - Patient declined No -  Patient declined - - - -  Copy of [place] in Chart? Yes - validated most recent copy scanned in chart (See row information) No - copy requested Yes - validated most recent copy scanned in chart (See row information) Yes Yes Yes Yes    Current Medications (verified) Outpatient Encounter Medications as of 11/17/2020  Medication Sig   calcium carbonate (TUMS - DOSED IN MG ELEMENTAL CALCIUM) 500 MG chewable tablet Chew 1 tablet by mouth 4 (four) times daily as needed for indigestion or heartburn.   Cholecalciferol (VITAMIN D3) 50 MCG (2000 UT) TABS Take 2,000 Units by mouth daily.   clonazePAM (KLONOPIN) 0.5 MG tablet TAKE 1/2 AT BEDTIME AS NEEDED FOR INSOMNIA, MAY INCREASE TO A WHOLE TABLET IF NEEDED   FLUoxetine (PROZAC) 20 MG capsule Take 1 capsule (20 mg total) by mouth daily. Increase to 40 mg after 2 weeks   fluticasone (FLONASE) 50 MCG/ACT nasal spray Place 2 sprays into both nostrils daily.   ipratropium (ATROVENT) 0.03 % nasal spray PLACE 2 SPRAYS INTO THE NOSE 4 (FOUR) TIMES DAILY. USE AS NEEDED FOR POST NASAL DRIP   levocetirizine (XYZAL) 5 MG tablet TAKE 1 TABLET BY MOUTH EVERY DAY IN THE EVENING   levothyroxine (SYNTHROID) 75 MCG tablet Take 1 tablet (75 mcg total) by mouth daily.   metoprolol succinate (TOPROL XL) 25 MG 24 hr tablet Take 0.5 tablets (12.5 mg total) by mouth daily.   Polyvinyl Alcohol-Povidone (REFRESH OP) Place 1 drop into both eyes daily as needed (dry eyes).   simvastatin (ZOCOR) 20 MG tablet TAKE 1 TABLET BY MOUTH EVERYDAY AT BEDTIME   buPROPion (WELLBUTRIN SR) 150 MG  12 hr tablet Take 1 tablet by mouth daily. (Patient not taking: Reported on 11/10/2020)   No facility-administered encounter medications on file as of 11/17/2020.    Allergies (verified) Ramipril, Sulfa antibiotics, and Sulfonamide derivatives   History: Past Medical History:  Diagnosis Date   Allergic rhinitis    Depression    DJD (degenerative joint disease)    GERD  (gastroesophageal reflux disease)    Headache(784.0)    sinus   Hypercholesteremia    Hypersomnia    Hypertension    Hypothyroidism    Internal hemorrhoid 11/2008   s/p banding ([place])   Lumbar disc disease    OBSTRUCTIVE SLEEP APNEA    NPSG 04/29/07- AHI 1.1/hr, RDI 21.2/hr. Weight 176 lbs. CPAP was tried based on the RDI.     OVERACTIVE BLADDER    pt unaware   Presence of permanent cardiac pacemaker 11/11/2015   Reactive depression (situational)    Sinus node dysfunction (HCC)    Spondylosis, cervical    Past Surgical History:  Procedure Laterality Date   BIOPSY BREAST     BREAST EXCISIONAL BIOPSY Left 2011   CATARACT EXTRACTION W/ INTRAOCULAR LENS IMPLANT Right    COLONOSCOPY     EP IMPLANTABLE DEVICE N/A 11/11/2015   Procedure: Pacemaker Implant;  Surgeon: Evans Lance, MD;  Location: [facility];  Service: Cardiovascular;  Laterality: N/A;   eye lid lift     INSERT / REPLACE / REMOVE PACEMAKER  11/11/2015   KNEE ARTHROSCOPY     Dr. [name]   MOUTH SURGERY  08/25/2017   implant fell out due to bone loss   REVERSE SHOULDER ARTHROPLASTY Right 08/08/2019   Procedure: REVERSE SHOULDER ARTHROPLASTY;  Surgeon: Tania Ade, MD;  Location: [facility];  Service: Orthopedics;  Laterality: Right;   rotator cuff surgery Right    Dr. [name]   TUBAL LIGATION     Family History  Problem Relation Age of Onset   Crohn's disease Father 66   Macular degeneration Mother    Cancer Mother 50       bladder, never confirmed with biospy   Hypothyroidism Mother    Tremor Mother    Stroke Mother 74   Parkinsonism Maternal Grandmother    Alzheimer's disease Paternal Aunt    Alzheimer's disease Paternal Uncle    Diabetes Paternal Grandmother    Colon cancer Neg Hx    Esophageal cancer Neg Hx    Rectal cancer Neg Hx    Social History   Socioeconomic History   Marital status: Married    Spouse name: Not on file   Number of children: 2   Years of education: Not on file    Highest education level: Not on file  Occupational History   Occupation: Solicitor radiology-retire    Employer: [employer]    Comment: data entry  Tobacco Use   Smoking status: Former    Years: 8.00    Types: Cigarettes    Quit date: 01/10/1966    Years since quitting: 54.8   Smokeless tobacco: Never  Vaping Use   Vaping Use: Never used  Substance and Sexual Activity   Alcohol use: No   Drug use: No   Sexual activity: Never    Birth control/protection: Post-menopausal  Other Topics Concern   Not on file  Social History Narrative   married, lives with spouse -   Retired 05/2013   Social Determinants of Health   Financial Resource Strain: Low Risk    Difficulty  of Paying Living Expenses: Not hard at all  Food Insecurity: No Food Insecurity   Worried About [place] in the Last Year: Never true   [place] in the Last Year: Never true  Transportation Needs: No Transportation Needs   Lack of Transportation (Medical): No   Lack of Transportation (Non-Medical): No  Physical Activity: Sufficiently Active   Days of Exercise per Week: 4 days   Minutes of Exercise per Session: 40 min  Stress: No Stress Concern Present   Feeling of Stress : Not at all  Social Connections: Socially Integrated   Frequency of Communication with Friends and Family: More than three times a week   Frequency of Social Gatherings with Friends and Family: More than three times a week   Attends Religious Services: More than 4 times per year   Active Member of [employer] or Organizations: Yes   Attends Music therapist: More than 4 times per year   Marital Status: Married    Tobacco Counseling Counseling given: Not Answered   Clinical Intake:  Pre-visit preparation completed: Yes  Pain : 0-10 Pain Score: 1  Pain Type: Chronic pain Pain Location: Back Pain Onset: More than a month ago Pain Frequency: Constant     BMI - recorded: 28.71 Nutritional Status: BMI 25  -29 Overweight Nutritional Risks: None Diabetes: No  How often do you need to have someone help you when you read instructions, pamphlets, or other written materials from your doctor or pharmacy?: 1 - Never  Diabetic?No  Interpreter Needed?: No  Information entered by :: Caroleen Hamman LPN   Activities of Daily Living In your present state of health, do you have any difficulty performing the following activities: 11/17/2020  Hearing? Y  Comment hearing aids  Vision? N  Difficulty concentrating or making decisions? N  Walking or climbing stairs? N  Dressing or bathing? N  Doing errands, shopping? N  Preparing Food and eating ? N  Using the Toilet? N  In the past six months, have you accidently leaked urine? N  Do you have problems with loss of bowel control? N  Managing your Medications? N  Managing your Finances? N  Housekeeping or managing your Housekeeping? N  Some recent data might be hidden    Patient Care Team: Copland, Gay Filler, MD as PCP - General (Family Medicine) Deneise Lever, MD (Pulmonary Disease) Gaynelle Arabian, MD (Orthopedic Surgery) Richmond Campbell, MD (Gastroenterology) Cherre Robins, RPH-CPP (Pharmacist)  Indicate any recent Medical Services you may have received from other than [facility] providers in the past year (date may be approximate).     Assessment:   This is a routine wellness examination for Theresa Mejia.  Hearing/Vision screen Hearing Screening - Comments:: Bilateral hearing aids Vision Screening - Comments:: Reading glasses Last eye exam-11/2020-Dr. Lyles  Dietary issues and exercise activities discussed: Current Exercise Habits: Home exercise routine, Type of exercise: walking;stretching, Time (Minutes): 30, Frequency (Times/Week): 4, Weekly Exercise (Minutes/Week): 120, Intensity: Mild   Goals Addressed             This Visit's Progress    Increase physical activity   On track      Depression Screen PHQ 2/9 Scores 11/17/2020  08/06/2020 08/22/2019 11/19/2018 11/14/2017 11/03/2016 06/08/2015  PHQ - 2 Score 1 0 1 0 6 4 1   PHQ- 9 Score - - 5 - 21 19 7     Fall Risk Fall Risk  11/17/2020 08/06/2020 08/06/2019 11/19/2018 11/14/2017  Falls in the  past year? 0 0 0 0 0  Comment - - Emmi Telephone Survey: data to providers prior to load - -  Number falls in past yr: 0 0 - 0 -  Injury with Fall? 0 0 - 0 -  Risk for fall due to : - No Fall Risks - - -  Follow up Falls prevention discussed Falls evaluation completed - - -    FALL RISK PREVENTION PERTAINING TO THE HOME:  Any stairs in or around the home? No  Home free of loose throw rugs in walkways, pet beds, electrical cords, etc? Yes  Adequate lighting in your home to reduce risk of falls? Yes   ASSISTIVE DEVICES UTILIZED TO PREVENT FALLS:  Life alert? No  Use of a cane, walker or w/c? No  Grab bars in the bathroom? Yes  Shower chair or bench in shower? Yes  Elevated toilet seat or a handicapped toilet? No   TIMED UP AND GO:  Was the test performed? No . Phone visit   Cognitive Function:Normal cognitive status assessed by this Nurse Health Advisor. No abnormalities found.   MMSE - Mini Mental State Exam 11/03/2016  Orientation to time 5  Orientation to Place 5  Registration 3  Attention/ Calculation 5  Recall 3  Language- name 2 objects 2  Language- repeat 1  Language- follow 3 step command 3  Language- read & follow direction 1  Write a sentence 1  Copy design 1  Total score 30     6CIT Screen 11/19/2018  What Year? 0 points  What month? 0 points  What time? 0 points  Count back from 20 0 points  Months in reverse 0 points  Repeat phrase 0 points  Total Score 0    Immunizations Immunization History  Administered Date(s) Administered   Influenza Split 10/11/2010, 10/04/2013   Influenza Whole 10/13/2009   Influenza, High Dose Seasonal PF 10/14/2016, 10/22/2017, 10/22/2018   Influenza-Unspecified 10/28/2015, 10/17/2019, 10/20/2020    PFIZER(Purple Top)SARS-COV-2 Vaccination 01/24/2019, 02/11/2019, 10/03/2019, 04/20/2020   Pfizer Covid-19 Vaccine Bivalent Booster 65yrs & up 10/20/2020   Pneumococcal Conjugate-13 11/06/2014   Pneumococcal Polysaccharide-23 02/25/2008   Td 02/25/2008   Tdap 10/23/2018   Zoster Recombinat (Shingrix) 04/27/2017, 08/01/2017    TDAP status: Up to date  Flu Vaccine status: Up to date  Pneumococcal vaccine status: Up to date  Covid-19 vaccine status: Completed vaccines  Qualifies for Shingles Vaccine? No   Zostavax completed No   Shingrix Completed?: Yes  Screening Tests Health Maintenance  Topic Date Due   COLONOSCOPY (Pts 45-39yrs Insurance coverage will need to be confirmed)  07/21/2025   TETANUS/TDAP  10/22/2028   Pneumonia Vaccine 46+ Years old  Completed   INFLUENZA VACCINE  Completed   DEXA SCAN  Completed   COVID-19 Vaccine  Completed   Hepatitis C Screening  Completed   Zoster Vaccines- Shingrix  Completed   HPV VACCINES  Aged Out    Health Maintenance  There are no preventive care reminders to display for this patient.   Colorectal cancer screening: Type of screening: Colonoscopy. Completed 07/21/2020. Repeat every 5 years  Mammogram status: Ordered today. Pt provided with contact info and advised to call to schedule appt.   Bone Density status: Completed 01/08/2019. Results reflect: Bone density results: OSTEOPENIA. Repeat every 2 years.  Lung Cancer Screening: (Low Dose CT Chest recommended if Age 61-80 years, 30 pack-year currently smoking OR have quit w/in 15years.) does not qualify.     Additional Screening:  Hepatitis C Screening: Completed 06/04/2015  Vision Screening: Recommended annual ophthalmology exams for early detection of glaucoma and other disorders of the eye. Is the patient up to date with their annual eye exam?  Yes  Who is the provider or what is the name of the office in which the patient attends annual eye exams? Dr. Prudencio Burly   Dental  Screening: Recommended annual dental exams for proper oral hygiene  Community Resource Referral / Chronic Care Management: CRR required this visit?  No   CCM required this visit?  No      Plan:     I have personally reviewed and noted the following in the patient's chart:   Medical and social history Use of alcohol, tobacco or illicit drugs  Current medications and supplements including opioid prescriptions.  Functional ability and status Nutritional status Physical activity Advanced directives List of other physicians Hospitalizations, surgeries, and ER visits in previous 12 months Vitals Screenings to include cognitive, depression, and falls Referrals and appointments  In addition, I have reviewed and discussed with patient certain preventive protocols, quality metrics, and best practice recommendations. A written personalized care plan for preventive services as well as general preventive health recommendations were provided to patient.   Due to this being a telephonic visit, the after visit summary with patients personalized plan was offered to patient via mail or my-chart. Patient would like to access on my-chart.   Marta Antu, LPN   [id number]  Nurse Health Advisor  Nurse Notes: None   Medical screening examination/treatment/procedure(s) were performed by non-physician practitioner and as supervising provider I was immediately available for consultation/collaboration.  I agree with above. Marrian Salvage, FNP

## 2020-11-17 NOTE — Patient Instructions (Signed)
Theresa Mejia , Thank you for taking time to complete your Medicare Wellness Visit. I appreciate your ongoing commitment to your health goals. Please review the following plan we discussed and let me know if I can assist you in the future.   Screening recommendations/referrals: Colonoscopy: Completed 07/21/2020 Mammogram: Ordered today. Someone will call you to schedule. Bone Density: Ordered today. Someone will call you to schedule. Recommended yearly ophthalmology/optometry visit for glaucoma screening and checkup Recommended yearly dental visit for hygiene and checkup  Vaccinations: Influenza vaccine: Up to date Pneumococcal vaccine: Up to date Tdap vaccine: Up to date-Due-10/22/2028 Shingles vaccine: Completed vaccines   Covid-19:Up to date  Advanced directives: Copy in chart  Conditions/risks identified: See problem list  Next appointment: Follow up in one year for your annual wellness visit 11/19/2021 @ 11:40.   Preventive Care 40 Years and Older, Female Preventive care refers to lifestyle choices and visits with your health care provider that can promote health and wellness. What does preventive care include? A yearly physical exam. This is also called an annual well check. Dental exams once or twice a year. Routine eye exams. Ask your health care provider how often you should have your eyes checked. Personal lifestyle choices, including: Daily care of your teeth and gums. Regular physical activity. Eating a healthy diet. Avoiding tobacco and drug use. Limiting alcohol use. Practicing safe sex. Taking low-dose aspirin every day. Taking vitamin and mineral supplements as recommended by your health care provider. What happens during an annual well check? The services and screenings done by your health care provider during your annual well check will depend on your age, overall health, lifestyle risk factors, and family history of disease. Counseling  Your health care  provider may ask you questions about your: Alcohol use. Tobacco use. Drug use. Emotional well-being. Home and relationship well-being. Sexual activity. Eating habits. History of falls. Memory and ability to understand (cognition). Work and work Statistician. Reproductive health. Screening  You may have the following tests or measurements: Height, weight, and BMI. Blood pressure. Lipid and cholesterol levels. These may be checked every 5 years, or more frequently if you are over 4 years old. Skin check. Lung cancer screening. You may have this screening every year starting at age 51 if you have a 30-pack-year history of smoking and currently smoke or have quit within the past 15 years. Fecal occult blood test (FOBT) of the stool. You may have this test every year starting at age 12. Flexible sigmoidoscopy or colonoscopy. You may have a sigmoidoscopy every 5 years or a colonoscopy every 10 years starting at age 6. Hepatitis C blood test. Hepatitis B blood test. Sexually transmitted disease (STD) testing. Diabetes screening. This is done by checking your blood sugar (glucose) after you have not eaten for a while (fasting). You may have this done every 1-3 years. Bone density scan. This is done to screen for osteoporosis. You may have this done starting at age 48. Mammogram. This may be done every 1-2 years. Talk to your health care provider about how often you should have regular mammograms. Talk with your health care provider about your test results, treatment options, and if necessary, the need for more tests. Vaccines  Your health care provider may recommend certain vaccines, such as: Influenza vaccine. This is recommended every year. Tetanus, diphtheria, and acellular pertussis (Tdap, Td) vaccine. You may need a Td booster every 10 years. Zoster vaccine. You may need this after age 2. Pneumococcal 13-valent conjugate (PCV13) vaccine. One  dose is recommended after age  21. Pneumococcal polysaccharide (PPSV23) vaccine. One dose is recommended after age 20. Talk to your health care provider about which screenings and vaccines you need and how often you need them. This information is not intended to replace advice given to you by your health care provider. Make sure you discuss any questions you have with your health care provider. Document Released: 01/23/2015 Document Revised: 09/16/2015 Document Reviewed: 10/28/2014 Elsevier Interactive Patient Education  2017 [place] Prevention in the Home Falls can cause injuries. They can happen to people of all ages. There are many things you can do to make your home safe and to help prevent falls. What can I do on the outside of my home? Regularly fix the edges of walkways and driveways and fix any cracks. Remove anything that might make you trip as you walk through a door, such as a raised step or threshold. Trim any bushes or trees on the path to your home. Use bright outdoor lighting. Clear any walking paths of anything that might make someone trip, such as rocks or tools. Regularly check to see if handrails are loose or broken. Make sure that both sides of any steps have handrails. Any raised decks and porches should have guardrails on the edges. Have any leaves, snow, or ice cleared regularly. Use sand or salt on walking paths during winter. Clean up any spills in your garage right away. This includes oil or grease spills. What can I do in the bathroom? Use night lights. Install grab bars by the toilet and in the tub and shower. Do not use towel bars as grab bars. Use non-skid mats or decals in the tub or shower. If you need to sit down in the shower, use a plastic, non-slip stool. Keep the floor dry. Clean up any water that spills on the floor as soon as it happens. Remove soap buildup in the tub or shower regularly. Attach bath mats securely with double-sided non-slip rug tape. Do not have throw  rugs and other things on the floor that can make you trip. What can I do in the bedroom? Use night lights. Make sure that you have a light by your bed that is easy to reach. Do not use any sheets or blankets that are too big for your bed. They should not hang down onto the floor. Have a firm chair that has side arms. You can use this for support while you get dressed. Do not have throw rugs and other things on the floor that can make you trip. What can I do in the kitchen? Clean up any spills right away. Avoid walking on wet floors. Keep items that you use a lot in easy-to-reach places. If you need to reach something above you, use a strong step stool that has a grab bar. Keep electrical cords out of the way. Do not use floor polish or wax that makes floors slippery. If you must use wax, use non-skid floor wax. Do not have throw rugs and other things on the floor that can make you trip. What can I do with my stairs? Do not leave any items on the stairs. Make sure that there are handrails on both sides of the stairs and use them. Fix handrails that are broken or loose. Make sure that handrails are as long as the stairways. Check any carpeting to make sure that it is firmly attached to the stairs. Fix any carpet that is loose or worn.  Avoid having throw rugs at the top or bottom of the stairs. If you do have throw rugs, attach them to the floor with carpet tape. Make sure that you have a light switch at the top of the stairs and the bottom of the stairs. If you do not have them, ask someone to add them for you. What else can I do to help prevent falls? Wear shoes that: Do not have high heels. Have rubber bottoms. Are comfortable and fit you well. Are closed at the toe. Do not wear sandals. If you use a stepladder: Make sure that it is fully opened. Do not climb a closed stepladder. Make sure that both sides of the stepladder are locked into place. Ask someone to hold it for you, if  possible. Clearly mark and make sure that you can see: Any grab bars or handrails. First and last steps. Where the edge of each step is. Use tools that help you move around (mobility aids) if they are needed. These include: Canes. Walkers. Scooters. Crutches. Turn on the lights when you go into a dark area. Replace any light bulbs as soon as they burn out. Set up your furniture so you have a clear path. Avoid moving your furniture around. If any of your floors are uneven, fix them. If there are any pets around you, be aware of where they are. Review your medicines with your doctor. Some medicines can make you feel dizzy. This can increase your chance of falling. Ask your doctor what other things that you can do to help prevent falls. This information is not intended to replace advice given to you by your health care provider. Make sure you discuss any questions you have with your health care provider. Document Released: 10/23/2008 Document Revised: 06/04/2015 Document Reviewed: 01/31/2014 Elsevier Interactive Patient Education  2017 [employer].

## 2020-11-18 ENCOUNTER — Ambulatory Visit (INDEPENDENT_AMBULATORY_CARE_PROVIDER_SITE_OTHER): Payer: PPO

## 2020-11-18 DIAGNOSIS — I495 Sick sinus syndrome: Secondary | ICD-10-CM

## 2020-11-18 LAB — CUP PACEART REMOTE DEVICE CHECK
Battery Remaining Percentage: 65 %
Brady Statistic RA Percent Paced: 100 %
Brady Statistic RV Percent Paced: 8 %
Date Time Interrogation Session: 20221109154310
Implantable Lead Implant Date: 20171101
Implantable Lead Implant Date: 20171101
Implantable Lead Location: 753859
Implantable Lead Location: 753860
Implantable Lead Model: 377
Implantable Lead Model: 377
Implantable Lead Serial Number: 49553678
Implantable Lead Serial Number: 49617393
Implantable Pulse Generator Implant Date: 20171101
Lead Channel Impedance Value: 488 Ohm
Lead Channel Impedance Value: 527 Ohm
Lead Channel Pacing Threshold Amplitude: 0.5 V
Lead Channel Pacing Threshold Amplitude: 0.8 V
Lead Channel Pacing Threshold Pulse Width: 0.4 ms
Lead Channel Pacing Threshold Pulse Width: 0.4 ms
Lead Channel Sensing Intrinsic Amplitude: 10.7 mV
Lead Channel Sensing Intrinsic Amplitude: 2.1 mV
Lead Channel Setting Pacing Amplitude: 2 V
Lead Channel Setting Pacing Amplitude: 2 V
Lead Channel Setting Pacing Pulse Width: 0.4 ms
Pulse Gen Model: 394969
Pulse Gen Serial Number: 68817609

## 2020-11-19 ENCOUNTER — Other Ambulatory Visit: Payer: Self-pay

## 2020-11-19 ENCOUNTER — Ambulatory Visit (HOSPITAL_COMMUNITY)
Admission: RE | Admit: 2020-11-19 | Discharge: 2020-11-19 | Disposition: A | Discharge status: Home / Self Care | Payer: PPO | Source: Ambulatory Visit | Attending: Orthopedic Surgery | Admitting: Orthopedic Surgery

## 2020-11-19 DIAGNOSIS — M545 Low back pain, unspecified: Secondary | ICD-10-CM | POA: Insufficient documentation

## 2020-11-19 NOTE — Progress Notes (Signed)
[facility] at [facility] for [facility] L-spine wo contrast. Patient has biotronik device. Adam-rep to the bedside to program patient. Orders for DOO 80

## 2020-11-23 ENCOUNTER — Other Ambulatory Visit: Payer: Self-pay | Admitting: Family Medicine

## 2020-11-23 ENCOUNTER — Telehealth: Payer: Self-pay

## 2020-11-23 DIAGNOSIS — Z1231 Encounter for screening mammogram for malignant neoplasm of breast: Secondary | ICD-10-CM

## 2020-11-23 NOTE — Telephone Encounter (Signed)
Pt called asking if she could get her prolia.   [employer] SOB says No PA is needed and 20% OOP, 20% admin fee, no ded as of 11/09/20.

## 2020-11-26 NOTE — Progress Notes (Signed)
Remote pacemaker transmission.   

## 2020-11-30 NOTE — Telephone Encounter (Signed)
Pt scheduled  

## 2020-12-06 DIAGNOSIS — M5416 Radiculopathy, lumbar region: Secondary | ICD-10-CM | POA: Insufficient documentation

## 2020-12-08 DIAGNOSIS — M5416 Radiculopathy, lumbar region: Secondary | ICD-10-CM | POA: Diagnosis not present

## 2020-12-09 ENCOUNTER — Ambulatory Visit (INDEPENDENT_AMBULATORY_CARE_PROVIDER_SITE_OTHER): Payer: PPO

## 2020-12-09 DIAGNOSIS — M810 Age-related osteoporosis without current pathological fracture: Secondary | ICD-10-CM

## 2020-12-09 MED ORDER — DENOSUMAB 60 MG/ML ~~LOC~~ SOSY
60.0000 mg | PREFILLED_SYRINGE | Freq: Once | SUBCUTANEOUS | Status: AC
  Administered 2020-12-09: 60 mg via SUBCUTANEOUS

## 2020-12-09 NOTE — Progress Notes (Signed)
Pt is here today for prolia injection. Pt was given 60mg  of prolia in left subq Pt tolerated well

## 2020-12-17 ENCOUNTER — Other Ambulatory Visit: Payer: Self-pay | Admitting: Family Medicine

## 2020-12-17 DIAGNOSIS — J301 Allergic rhinitis due to pollen: Secondary | ICD-10-CM

## 2020-12-17 DIAGNOSIS — F5101 Primary insomnia: Secondary | ICD-10-CM

## 2020-12-25 ENCOUNTER — Ambulatory Visit: Payer: PPO

## 2021-01-07 DIAGNOSIS — M5416 Radiculopathy, lumbar region: Secondary | ICD-10-CM | POA: Diagnosis not present

## 2021-01-19 ENCOUNTER — Telehealth: Payer: Self-pay | Admitting: Family Medicine

## 2021-01-19 NOTE — Telephone Encounter (Signed)
Pt dropped off document to be filled out by provider [facility] Medical Clearance form 1 page-attached is also pt's spouse documents also to be filled out -tel note on pt's chart also) Pt would like documents to be mailed out when ready. Document put at front office tray under providers name.

## 2021-01-20 ENCOUNTER — Encounter: Payer: Self-pay | Admitting: Family Medicine

## 2021-01-20 NOTE — Telephone Encounter (Signed)
Pt and her husband would like clearance to participate in a swim program at [facility]. Forms given to provider for review.

## 2021-01-27 ENCOUNTER — Ambulatory Visit
Admission: RE | Admit: 2021-01-27 | Discharge: 2021-01-27 | Disposition: A | Discharge status: Home / Self Care | Payer: PPO | Source: Ambulatory Visit | Attending: Family Medicine | Admitting: Family Medicine

## 2021-01-27 DIAGNOSIS — Z1231 Encounter for screening mammogram for malignant neoplasm of breast: Secondary | ICD-10-CM

## 2021-01-29 DIAGNOSIS — M5416 Radiculopathy, lumbar region: Secondary | ICD-10-CM | POA: Diagnosis not present

## 2021-02-15 ENCOUNTER — Telehealth: Payer: PPO

## 2021-02-16 ENCOUNTER — Ambulatory Visit (INDEPENDENT_AMBULATORY_CARE_PROVIDER_SITE_OTHER): Payer: PPO | Admitting: Pharmacist

## 2021-02-16 ENCOUNTER — Telehealth: Payer: Self-pay | Admitting: Pharmacist

## 2021-02-16 ENCOUNTER — Encounter: Payer: Self-pay | Admitting: Family Medicine

## 2021-02-16 DIAGNOSIS — E079 Disorder of thyroid, unspecified: Secondary | ICD-10-CM

## 2021-02-16 DIAGNOSIS — R002 Palpitations: Secondary | ICD-10-CM

## 2021-02-16 DIAGNOSIS — M810 Age-related osteoporosis without current pathological fracture: Secondary | ICD-10-CM

## 2021-02-16 DIAGNOSIS — F418 Other specified anxiety disorders: Secondary | ICD-10-CM

## 2021-02-16 NOTE — Telephone Encounter (Signed)
During Chronic Care Management visit today patient mentioned that she notice burning on the bottom of her right foot for the last 6 weeks. Occurs no matter shoes that she wears. She google sympoms and was a little concerned about what could be causing burning.  She was thinking about going back to podiatrist but wanted Dr Lillie Fragmin advice about evaluation and recommendations.

## 2021-02-16 NOTE — Chronic Care Management (AMB) (Signed)
Chronic Care Management Pharmacy Note  02/16/2021 Name:  Theresa Mejia MRN:  [id number] DOB:  [birth year]  Summary:  Patient try taking fluoxetine 31m each morning for 2 weeks, then lower to every OTHER day for 2 weeks.   Subjective: Theresa ABBASIis an 81y.o. year old female who is a primary patient of Copland, JGay Filler MD.  The CCM team was consulted for assistance with disease management and care coordination needs.    Engaged with patient by telephone for follow up visit in response to provider referral for pharmacy case management and/or care coordination services.   Consent to Services:  The patient was given information about Chronic Care Management services, agreed to services, and gave verbal consent prior to initiation of services.  Please see initial visit note for detailed documentation.   Patient Care Team: Copland, JGay Filler MD as PCP - General (Family Medicine) YDeneise Lever MD (Pulmonary Disease) AGaynelle Arabian MD (Orthopedic Surgery) MRichmond Campbell MD (Gastroenterology) ECherre Robins RPH-CPP (Pharmacist)  Recent office visits: 12/09/2020 - Prolia injection 08/06/2020 - PCP (Dr CLorelei Pont Lightheadedness / low BP. Recommended hold amlodipine. CBC, TSH and vit D all WNL.   Recent consult visits: 12/08/2020 - Ortho surgery (Dr AWynelle Link Seen for radiculopathy, lumbar 11/12/2020 - Ortho surgery (Dr AWynelle Link seen for low back pain and left hip pain.  11/10/2020 - Cardio (Dr TLovena Le F/U palpitations. Improved with metoprolol  11/09/2020 - Opthalmology (Dr LPrudencio Burly seen for dry eye syndrome and monitoring of cataract.  09/23/20 (Podiatry) Dr EAmalia Hailey Seen for ingrown toe nail. Light debridement of open wound to the periungual border. Follow up PRN basis. 09/06/2020 - Cardio - MRaytheon Patient reports low BP and racing HR. Dr TLovena Lestopped telmisartan and started metoprolol 223m- take 0.5 tablet daily  09/09/20 (Podiatry) Dr EvAmalia HaileySeen for ingrown toe  nail. Patient had permanent partial nail avulsion of the ingrown nail. Use triple antibiotic daily. F/U in 2 weeks. 08/25/20 (Cardiology) GrChamp MungoTaLovena LeMD. Seen for general follow up. Discussed lightheadedness. Recommended check BP, if low then stop telmisartan.   Hospital visits: None in lat 6 months  Objective:  Lab Results  Component Value Date   CREATININE 0.86 08/06/2020   CREATININE 0.77 01/06/2020   CREATININE 0.84 08/01/2019    Lab Results  Component Value Date   HGBA1C 5.3 10/24/2019   Last diabetic Eye exam: No results found for: HMDIABEYEEXA  Last diabetic Foot exam: No results found for: HMDIABFOOTEX      Component Value Date/Time   CHOL 126 10/24/2019 1020   TRIG 91 10/24/2019 1020   HDL 37 (L) 10/24/2019 1020   CHOLHDL 3.4 10/24/2019 1020   VLDL 33.6 10/22/2018 1516   LDLCALC 72 10/24/2019 1020   LDLDIRECT 148.3 09/01/2010 1041    Hepatic Function Latest Ref Rng & Units 08/06/2020 01/06/2020 08/01/2019  Total Protein 6.0 - 8.3 g/dL 6.5 7.0 7.2  Albumin 3.5 - 5.2 g/dL 4.2 4.6 4.4  AST 0 - 37 U/L 14 14 18   ALT 0 - 35 U/L 12 12 17   Alk Phosphatase 39 - 117 U/L 34(L) 42 36(L)  Total Bilirubin 0.2 - 1.2 mg/dL 0.4 0.6 0.7  Bilirubin, Direct 0.0 - 0.3 mg/dL - - -    Lab Results  Component Value Date/Time   TSH 0.81 09/10/2020 01:24 PM   TSH 0.77 08/06/2020 04:09 PM   FREET4 1.06 06/04/2015 02:43 PM    CBC Latest Ref Rng & Units 08/06/2020 01/06/2020 08/01/2019  WBC 4.0 - 10.5 K/uL 5.3 5.3 5.1  Hemoglobin 12.0 - 15.0 g/dL 12.2 13.1 13.4  Hematocrit 36.0 - 46.0 % 36.2 39.3 40.5  Platelets 150.0 - 400.0 K/uL 265.0 248.0 242    Lab Results  Component Value Date/Time   VD25OH 61.59 08/06/2020 04:09 PM   VD25OH 69.71 11/03/2016 11:04 AM    Clinical ASCVD: No  The ASCVD Risk score (Arnett DK, et al., 2019) failed to calculate for the following reasons:   The valid total cholesterol range is 130 to 320 mg/dL     Social History   Tobacco Use   Smoking Status Former   Years: 8.00   Types: Cigarettes   Quit date: 01/10/1966   Years since quitting: 55.1  Smokeless Tobacco Never   BP Readings from Last 3 Encounters:  08/25/20 120/72  08/06/20 110/70  07/14/20 126/80   Pulse Readings from Last 3 Encounters:  08/25/20 70  08/06/20 82  07/14/20 76   Wt Readings from Last 3 Encounters:  11/17/20 147 lb (66.7 kg)  11/10/20 147 lb 3.2 oz (66.8 kg)  08/25/20 147 lb 6.4 oz (66.9 kg)    Assessment: Review of patient past medical history, allergies, medications, health status, including review of consultants reports, laboratory and other test data, was performed as part of comprehensive evaluation and provision of chronic care management services.   SDOH:  (Social Determinants of Health) assessments and interventions performed:  SDOH Interventions    Flowsheet Row Most Recent Value  SDOH Interventions   Financial Strain Interventions Intervention Not Indicated  Physical Activity Interventions Other (Comments)  [Patient has plans to go to [facility] for water exercise / therapy]       CCM Care Plan  Allergies  Allergen Reactions   Ramipril     Unknown reaction   Sulfa Antibiotics     Says it dropped her blood pressure really low.    Sulfonamide Derivatives     REACTION: hypotension    Medications Reviewed Today     Reviewed by Cherre Robins, RPH-CPP (Pharmacist) on 02/16/21 at [phone]  Med List Status: <None>   Medication Order Taking? Sig Documenting Provider Last Dose Status Informant  calcium carbonate (TUMS - DOSED IN MG ELEMENTAL CALCIUM) 500 MG chewable tablet [id number] Yes Chew 1 tablet by mouth 4 (four) times daily as needed for indigestion or heartburn. [provider] Taking Active Self  Cholecalciferol (VITAMIN D3) 50 MCG (2000 UT) TABS [id number] Yes Take 2,000 Units by mouth daily. [provider] Taking Active Self  clonazePAM (KLONOPIN) 0.5 MG tablet [id number] Yes TAKE 1/2 AT BEDTIME AS  NEEDED FOR INSOMNIA, MAY INCREASE TO A WHOLE TABLET IF NEEDED Copland, Gay Filler, MD Taking Active   denosumab (PROLIA) 60 MG/ML SOSY injection [id number] Yes Inject 60 mg into the skin every 6 (six) months. [provider] Taking Active   FLUoxetine (PROZAC) 20 MG capsule [id number] Yes Take 1 capsule (20 mg total) by mouth daily. Increase to 40 mg after 2 weeks Copland, Gay Filler, MD Taking Active   fluticasone (FLONASE) 50 MCG/ACT nasal spray [id number] Yes Place 2 sprays into both nostrils daily. Copland, Gay Filler, MD Taking Active   ipratropium (ATROVENT) 0.03 % nasal spray [id number] Yes PLACE 2 SPRAYS INTO THE NOSE 4 (FOUR) TIMES DAILY. USE AS NEEDED FOR POST NASAL DRIP Copland, Gay Filler, MD Taking Active   levocetirizine (XYZAL) 5 MG tablet [id number] Yes TAKE 1 TABLET BY MOUTH EVERY DAY IN THE EVENING Copland, Gay Filler, MD Taking  Active   levothyroxine (SYNTHROID) 75 MCG tablet [id number] Yes Take 1 tablet (75 mcg total) by mouth daily. Shamleffer, Melanie Crazier, MD Taking Active   metoprolol succinate (TOPROL XL) 25 MG 24 hr tablet [id number] Yes Take 0.5 tablets (12.5 mg total) by mouth daily. Evans Lance, MD Taking Active   Polyvinyl Alcohol-Povidone [facility]) [id number] Yes Place 1 drop into both eyes daily as needed (dry eyes). [provider] Taking Active Self  simvastatin (ZOCOR) 20 MG tablet [id number] Yes TAKE 1 TABLET BY MOUTH EVERYDAY AT BEDTIME Copland, Gay Filler, MD Taking Active             Patient Active Problem List   Diagnosis Date Noted   Pacemaker 08/25/2020   Osteopenia 01/09/2019   Pansinusitis 03/06/2018   Cough 03/06/2018   Orthostatic hypotension 12/06/2016   Viral URI with cough 04/20/2016   Sinus node dysfunction ([place]) 11/11/2015   Routine general medical examination at a health care facility 06/04/2015   Insomnia with sleep apnea 06/04/2015   Bronchitis, chronic obstructive w acute bronchitis ([place]) 12/10/2014   Lumbar disc disease  09/01/2010   Obstructive sleep apnea 05/31/2007   Hyperlipidemia with target LDL less than 130 02/05/2007   Depression with anxiety 02/05/2007   Allergic rhinitis 02/05/2007   Hypothyroidism 02/02/2007   Essential hypertension 02/02/2007    Immunization History  Administered Date(s) Administered   Influenza Split 10/11/2010, 10/04/2013   Influenza Whole 10/13/2009   Influenza, High Dose Seasonal PF 10/14/2016, 10/22/2017, 10/22/2018   Influenza-Unspecified 10/28/2015, 10/17/2019, 10/20/2020   PFIZER(Purple Top)SARS-COV-2 Vaccination 01/24/2019, 02/11/2019, 10/03/2019, 04/20/2020   Pfizer Covid-19 Vaccine Bivalent Booster 48yr & up 10/20/2020   Pneumococcal Conjugate-13 11/06/2014   Pneumococcal Polysaccharide-23 02/25/2008   Td 02/25/2008   Tdap 10/23/2018   Zoster Recombinat (Shingrix) 04/27/2017, 08/01/2017    Conditions to be addressed/monitored: HTN, HLD, Anxiety, Depression, and OSA; insomnia; lightheadedness; hypothyroidism; osteopenia;   Care Plan : General Pharmacy (Adult)  Updates made by ECherre Robins RPH-CPP since 02/16/2021 12:00 AM     Problem: HTN; depression with anxiety and insomnia; OSA; hypothyroidism; osteopenia; hyperlipidemia; sinus node dysfunciton   Priority: High  Onset Date: 08/11/2020     Long-Range Goal: Pharmacy care plan for chronic conditions and medication management   Start Date: 08/11/2020  This Visit's Progress: On track  Priority: High  Note:   Current Barriers:  Experiencing lightheadedness - possibly related to medication adverse effect (improved since telmisartan stopped) Has been unable to participate in usual exercise  Not taking fluoxetine as prescribed  Pharmacist Clinical Goal(s):  Over the next 90 days, patient will maintain control of hypertension as evidenced by BP <140/90  adhere to prescribed medication regimen as evidenced by refill history Reviewed medication list and identify potential pharmacotherapy causes of  lightheadedness.   through collaboration with PharmD and provider. (Goal achieved)   Interventions: 1:1 collaboration with Copland, JGay Filler MD regarding development and update of comprehensive plan of care as evidenced by provider attestation and co-signature Inter-disciplinary care team collaboration (see longitudinal plan of care) Comprehensive medication review performed; medication list updated in electronic medical record   Hypertension/ Palpitations: Controlled; BP goal <140/90 Home BP reading: 122/68 Recent HR: 70-85 Patient reports feeling of lightheadedness has improved with discontinuation of telmisartan. Palpitations better with initiation of metoprolol ER 232m She does mention that HR is still higher than she would expect. She has Pacemaker device check scheduled for tomorrow. Recommend she message cardiologist with question about HR.  Current regimen:  Metoprolol  ER 39m - take 0.5 tablet - 12.539mdaily  Previous medications: Amlodipine was stopped 08/06/2020 due to dizziness; telmisartan stopped in 2022 due to dizziness. Interventions: Recommended patient to check blood pressure 2 to 3 times per week and record Continue current therapy for blood pressure and heart rate.  Has follow up with device clinic tomorrow - discuss heart rate.    Hyperlipidemia Controlled;  LDL goal < 100 Exercise: non currently but previously was attending exercise classes regularly at local gym. [place]  Current regimen:  Simvastatin 2088maily Interventions: Discussed LDL goals  Maintain cholesterol medication regimen.   Anxiety / Depression / insomnia: Controlled; Goal:  minimize anxiety and depression symptoms. Also limit potential side effects from medication therapy Current regimen:  Fluoxetine 30m50mtake 1 tablet daily for 2 weeks, then increase to 2 tablets = 40mg7mly (patient report she is only taking once daily) Clonazepam 0.5mg  50make 0.5 tablet at bedtime as needed for sleep  Fluoxetine was started in May 2022. Patient is unsure is she still need to take. At the time it was started she was experiencing several life changes - retirement, death of her mother, decrease in her husband's health.  Meds tried in past: bupropion, sertraline, Effexor, citalopram (patient was unsure why stopped and no reason for discontinuation documentation available in chart) Interventions: Patient would like to taper dose of fluoxetine to see if still needed. Recommended she can lower dose to take 30mg e78m other day for 2 to 3 weeks. If not recurrence of symptoms can try stopped.  Discussed possible alternatives to fluoxetine in future. Could consider escitalopram 10mg da56m If change is made, recommend 7 day washout of fluoxetine and then start escitalopram 10mg dai46m Osteopenia Goal: reduce risk of fracture due to osteopenia Last DEXA 01/08/2019: Left Forearm Radius T-score 01/08/2019 =  -2.2  Left Forearm Radius T-score 04/21/2016 =  -1.8  Femur Neck Right T-score 01/08/2019 = -1.8  Femur Neck Right T-score 04/21/2016 = -1.4  Frax 10 Year Risk:   Major Osteoporotic Fracture: 21.7% Hip Fracture:   12.1% Population:  [place] Risk Factors: Family Hist. (Parent hip fracture), Secondary Osteoporosis Current regimen:  Vitamin D 2000 units every day Tums 500mg as n32md Interventions: Recommend continue vitamin D and Tums Due to recheck bone density  - appointment 04/28/2021  Medication management Pharmacist Clinical Goal(s): Over the next 90 days, patient will work with PharmD and providers to maintain optimal medication adherence [facility] Interventions Comprehensive medication review performed. Continue current medication management strategy Patient self care activities - Over the next 90 days, patient will: Focus on medication adherence by filling and taking medications appropriately  Take medications  as prescribed Report any questions or concerns to PharmD and/or provider(s)  Patient Goals/Self-Care Activities Over the next 90 days, patient will:  take medications as prescribed, check blood pressure  and heart rate 2 to 3 times per week , document, and provide at future appointments, and restart regular exercise as able and work up to goal of 150 minutes per week.   Follow Up Plan: Telephone follow up appointment with care management team member scheduled for:  3 weeks to recheck mood / anxiety / depression                    Medication Assistance: None required.  Patient affirms current coverage meets needs.  Patient's preferred pharmacy is:  [facility] [place], [place] - [address] [place] [address] Phone: [phone] Fax: [phone]  [facility] [place] [place] - [address] [place] [address] Phone: [phone] Fax: [phone]  [id number] [id number]: [phone]: [phone]   Follow Up:  Patient agrees to Care Plan and Follow-up.  Plan: Telephone follow up appointment with care management team member scheduled for:  3 weeks  TCherre Robins PharmD Clinical Pharmacist [facility]

## 2021-02-16 NOTE — Patient Instructions (Signed)
Theresa Mejia It was a pleasure speaking with you today.  I have attached a summary of our visit today and information about your health goals.    If you have any questions or concerns, please feel free to contact me either at the phone number below or with a MyChart message.   Keep up the good work!  Cherre Robins, PharmD Clinical Pharmacist [facility] [phone] (direct line)  [phone] (main office number)  CARE PLAN ENTRY (see longitudinal plan of care for additional care plan information)  Current Barriers:  Chronic Disease Management support, education, and care coordination needs related to HTN, HLD, Depression and anxiety, Allergic Rhinitis, Hypothyroidism, GERD, Vitamin B12 Deficiency, Vitamin D Deficiency, Osteopenia   Hypertension BP Readings from Last 3 Encounters:  08/25/20 120/72  08/06/20 110/70  07/14/20 126/80   Pharmacist Clinical Goal(s): Over the next 90 days, patient will work with PharmD and providers to maintain BP goal <140/90 Current regimen:  Metoprolol ER 25mg  - take 0.5 tablet = 12.5mg  daily  Interventions: Requested patient to check blood pressure 2 to 3 times per week and record Also check blood pressure when feeling lightheaded Patient self care activities - Over the next 90 days, patient will: Check blood pressure 2 to 3 times per week and when feeling lightheaded, document, and provide at future appointments Ensure daily salt intake < 2300 mg/day  Hyperlipidemia Lab Results  Component Value Date/Time   LDLCALC 72 10/24/2019 10:20 AM   LDLDIRECT 148.3 09/01/2010 10:41 AM   Pharmacist Clinical Goal(s): Over the next 90 days, patient will work with PharmD and providers to achieve LDL goal < 100 Current regimen:  Simvastatin 20mg  daily Interventions: Discussed LDL goals  Patient self care activities - Over the next 90 days, patient will: Maintain cholesterol medication regimen.   Anxiety /  Depression: Pharmacist Clinical Goal(s): Over the next 90 days, patient will work with PharmD and providers to minimize anxiety and depression symptoms. Also limit potential side effects from medication therapy Current regimen:  Fluoxetine 20mg  - take 1 tablet daily for 2 weeks, then increase to 2 tablets = 40mg  daily (patient report she is only taking once daily) Clonazepam 0.5mg  - take 0.5 tablet at bedtime as needed for sleep  Interventions: Discussed timing of onset of lightheadedness and change to fluoxetine - could be related.  Discussed possible alternative to fluoxetine with Dr Lorelei Pont and patient. Consider escitalopram / Lexapro 10mg  daily  Patient self care activities - Over the next 90 days, patient will: Continue to take fluoxetine 20mg  daily in morning for 2 weeks, then lower dose ot take 20mg  every OTHER day. Will follow up with you in about 3 weeks to see how you are doing.   Osteopenia Pharmacist Clinical Goal(s) Over the next 90 days, patient will work with PharmD and providers to reduce risk of fracture due to osteopenia Current regimen:  Vitamin D 2000 units every day Tums 500mg  as needed Interventions: Discussed option of Prolia to replace fosamax (at previous visit) Patient self care activities - Over the next 90 days, patient will: Continue vitamin D and Tums Due to recheck bone density 04/28/2021  Medication management Pharmacist Clinical Goal(s): Over the next 90 days, patient will work with PharmD and providers to maintain optimal medication adherence [facility] Interventions Comprehensive medication review performed. Continue current medication management strategy Patient self care activities - Over the next 90 days, patient will: Focus on medication adherence by filling and taking medications appropriately  Take medications as prescribed Report any questions or concerns to PharmD and/or provider(s)   Patient Goals/Self-Care Activities Over  the next 90 days, patient will:  take medications as prescribed, check blood pressure  and heart rate 2 to 3 times per week , document, and provide at future appointments, and restart regular exercise as able and work up to goal of 150 minutes per week  Patient verbalizes understanding of instructions and care plan provided today and agrees to view in [place]. Active MyChart status confirmed with patient.

## 2021-02-17 ENCOUNTER — Ambulatory Visit (INDEPENDENT_AMBULATORY_CARE_PROVIDER_SITE_OTHER): Payer: PPO

## 2021-02-17 DIAGNOSIS — I495 Sick sinus syndrome: Secondary | ICD-10-CM | POA: Diagnosis not present

## 2021-02-17 LAB — CUP PACEART REMOTE DEVICE CHECK
Date Time Interrogation Session: 20230208085452
Implantable Lead Implant Date: 20171101
Implantable Lead Implant Date: 20171101
Implantable Lead Location: 753859
Implantable Lead Location: 753860
Implantable Lead Model: 377
Implantable Lead Model: 377
Implantable Lead Serial Number: 49553678
Implantable Lead Serial Number: 49617393
Implantable Pulse Generator Implant Date: 20171101
Pulse Gen Model: 394969
Pulse Gen Serial Number: 68817609

## 2021-02-22 NOTE — Progress Notes (Signed)
Remote pacemaker transmission.   

## 2021-03-01 DIAGNOSIS — M5416 Radiculopathy, lumbar region: Secondary | ICD-10-CM | POA: Diagnosis not present

## 2021-03-04 DIAGNOSIS — K5900 Constipation, unspecified: Secondary | ICD-10-CM | POA: Diagnosis not present

## 2021-03-04 DIAGNOSIS — K648 Other hemorrhoids: Secondary | ICD-10-CM | POA: Diagnosis not present

## 2021-03-09 ENCOUNTER — Ambulatory Visit: Payer: PPO | Admitting: Pharmacist

## 2021-03-09 DIAGNOSIS — R5382 Chronic fatigue, unspecified: Secondary | ICD-10-CM

## 2021-03-09 DIAGNOSIS — F5101 Primary insomnia: Secondary | ICD-10-CM

## 2021-03-09 DIAGNOSIS — R002 Palpitations: Secondary | ICD-10-CM

## 2021-03-09 DIAGNOSIS — F418 Other specified anxiety disorders: Secondary | ICD-10-CM | POA: Diagnosis not present

## 2021-03-09 DIAGNOSIS — M810 Age-related osteoporosis without current pathological fracture: Secondary | ICD-10-CM | POA: Diagnosis not present

## 2021-03-09 NOTE — Chronic Care Management (AMB) (Signed)
Chronic Care Management Pharmacy Note  03/09/2021 Name:  Theresa Mejia MRN:  [id number] DOB:  05-08-1940  Summary:  Fatigue / Burning in Feet: Patient had complained of burning in feet at last appointment in January 2023. Consulted with PCP and Dr Lorelei Pont recommended either appointment or have labs to r/o B12 deficiency or iron deficiency. Patient had initially stated she would make appointment with Dr Lorelei Pont but she has not yet due to many other medical appointments. Patient would like to get labs r/o B12 deficiency or iron deficiency. Sent message with request to Dr Lorelei Pont.   Sleeplessness: Current therapy: Clonazepam 0.53m - take 0.5 tablet at bedtime if needed. Patient would like to try something over-the-counter for sleep if available. Suggested trial of melatonin 379mat bedtime (make take up to 1090mt bedtime). Sleep hygiene discussed.   Subjective: Theresa Mejia, Theresa Mejia.  The CCM team was consulted for assistance with disease management and care coordination needs.    Engaged with patient by telephone for follow up visit in response to provider referral for pharmacy case management and/or care coordination services.   Consent to Services:  The patient was given information about Chronic Care Management services, agreed to services, and gave verbal consent prior to initiation of services.  Please see initial visit note for detailed documentation.   Patient Care Team: Mejia, Theresa Mejia as PCP - General (Family Medicine) YouDeneise LeverD (Pulmonary Disease) AluGaynelle ArabianD (Orthopedic Surgery) MedRichmond CampbellD (Gastroenterology) EckCherre RobinsPH-CPP (Pharmacist)  Recent office visits: 12/09/2020 - Prolia injection 08/06/2020 - PCP (Dr CopLorelei Pontightheadedness / low BP. Recommended hold amlodipine. CBC, TSH and vit D all WNL.   Recent consult visits: 03/04/2021 - GI ([facility]  [facility] 02/23/2021, internal hemorrhoids and incomplete fecal evacuations. Plan: Benefiber 1-2 tsp QAM; MiraLAX titrated to effect QHS. Schedule banding with Dr. MedEarlean Shawl/14/2023 - [facility] ([facility] for rectal bleeding x 3 occasions. Recommended office visit.  12/08/2020 - Ortho surgery (Dr AluWynelle Linkeen for radiculopathy, lumbar 11/12/2020 - Ortho surgery (Dr AluWynelle Linkeen for low back pain and left hip pain.  11/10/2020 - Cardio (Dr TayLovena Le/U palpitations. Improved with metoprolol  11/09/2020 - Opthalmology (Dr LylPrudencio Burlyeen for dry eye syndrome and monitoring of cataract.  09/23/20 (Podiatry) Dr EvaAmalia Haileyeen for ingrown toe nail. Light debridement of open wound to the periungual border. Follow up PRN basis. 09/06/2020 - Cardio - MyCRaytheonatient reports low BP and racing HR. Dr TayLovena Leopped telmisartan and started metoprolol 47m31mtake 0.5 tablet daily  09/09/20 (Podiatry) Dr EvanAmalia Haileyen for ingrown toe nail. Patient had permanent partial nail avulsion of the ingrown nail. Use triple antibiotic daily. F/U in 2 weeks. 08/25/20 (Cardiology) GregChamp MungoylLovena Le. Seen for general follow up. Discussed lightheadedness. Recommended check BP, if low then stop telmisartan.   Hospital visits: None in lat 6 months  Objective:  Lab Results  Component Value Date   CREATININE 0.86 08/06/2020   CREATININE 0.77 01/06/2020   CREATININE 0.84 08/01/2019    Lab Results  Component Value Date   HGBA1C 5.3 10/24/2019   Last diabetic Eye exam: No results found for: HMDIABEYEEXA  Last diabetic Foot exam: No results found for: HMDIABFOOTEX      Component Value Date/Time   CHOL 126 10/24/2019 1020   TRIG  91 10/24/2019 1020   HDL 37 (L) 10/24/2019 1020   CHOLHDL 3.4 10/24/2019 1020   VLDL 33.6 10/22/2018 1516   LDLCALC 72 10/24/2019 1020   LDLDIRECT 148.3 09/01/2010 1041    Hepatic Function Latest Ref Rng & Units 08/06/2020 01/06/2020 08/01/2019  Total  Protein 6.0 - 8.3 g/dL 6.5 7.0 7.2  Albumin 3.5 - 5.2 g/dL 4.2 4.6 4.4  AST 0 - 37 U/L 14 14 18   ALT 0 - 35 U/L 12 12 17   Alk Phosphatase 39 - 117 U/L 34(L) 42 36(L)  Total Bilirubin 0.2 - 1.2 mg/dL 0.4 0.6 0.7  Bilirubin, Direct 0.0 - 0.3 mg/dL - - -    Lab Results  Component Value Date/Time   TSH 0.81 09/10/2020 01:24 PM   TSH 0.77 08/06/2020 04:09 PM   FREET4 1.06 06/04/2015 02:43 PM    CBC Latest Ref Rng & Units 08/06/2020 01/06/2020 08/01/2019  WBC 4.0 - 10.5 K/uL 5.3 5.3 5.1  Hemoglobin 12.0 - 15.0 g/dL 12.2 13.1 13.4  Hematocrit 36.0 - 46.0 % 36.2 39.3 40.5  Platelets 150.0 - 400.0 K/uL 265.0 248.0 242    Lab Results  Component Value Date/Time   VD25OH 61.59 08/06/2020 04:09 PM   VD25OH 69.71 11/03/2016 11:04 AM    Clinical ASCVD: No  The ASCVD Risk score (Arnett DK, et al., 2019) failed to calculate for the following reasons:   The valid total cholesterol range is 130 to 320 mg/dL     Social History   Tobacco Use  Smoking Status Former   Years: 8.00   Types: Cigarettes   Quit date: 01/10/1966   Years since quitting: 55.1  Smokeless Tobacco Never   BP Readings from Last 3 Encounters:  08/25/20 120/72  08/06/20 110/70  07/14/20 126/80   Pulse Readings from Last 3 Encounters:  08/25/20 70  08/06/20 82  07/14/20 76   Wt Readings from Last 3 Encounters:  11/17/20 147 lb (66.7 kg)  11/10/20 147 lb 3.2 oz (66.8 kg)  08/25/20 147 lb 6.4 oz (66.9 kg)    Assessment: Review of patient past medical history, allergies, medications, health status, including review of consultants reports, laboratory and other test data, was performed as part of comprehensive evaluation and provision of chronic care management services.   SDOH:  (Social Determinants of Health) assessments and interventions performed:     CCM Care Plan  Allergies  Allergen Reactions   Ramipril     Unknown reaction   Sulfa Antibiotics     Says it dropped her blood pressure really low.     Sulfonamide Derivatives     REACTION: hypotension    Medications Reviewed Today     Reviewed by Cherre Robins, RPH-CPP (Pharmacist) on 03/09/21 at [phone]  Med List Status: <None>   Medication Order Taking? Sig Documenting Provider Last Dose Status Informant  calcium carbonate (TUMS - DOSED IN MG ELEMENTAL CALCIUM) 500 MG chewable tablet [id number] Yes Chew 1 tablet by mouth 4 (four) times daily as needed for indigestion or heartburn. [provider] Taking Active Self  Cholecalciferol (VITAMIN D3) 50 MCG (2000 UT) TABS [id number] Yes Take 2,000 Units by mouth daily. [provider] Taking Active Self  clonazePAM (KLONOPIN) 0.5 MG tablet [id number] Yes TAKE 1/2 AT BEDTIME AS NEEDED FOR INSOMNIA, MAY INCREASE TO A WHOLE TABLET IF NEEDED Mejia, Gay Filler, MD Taking Active   denosumab (PROLIA) 60 MG/ML SOSY injection [id number] Yes Inject 60 mg into the skin every 6 (six) months. [provider] Taking Active   famotidine (PEPCID) 10 MG tablet [id number] Yes Take 10 mg by mouth as needed for heartburn or indigestion. [provider] Taking Active   FLUoxetine (PROZAC) 20 MG capsule [id number] Yes Take 1 capsule (20 mg total) by mouth daily. Increase to 40 mg after 2 weeks Mejia, Gay Filler, MD Taking Active   fluticasone (FLONASE) 50 MCG/ACT nasal spray [id number] Yes Place 2 sprays into both nostrils daily. Mejia, Gay Filler, MD Taking Active   ipratropium (ATROVENT) 0.03 % nasal spray [id number] Yes PLACE 2 SPRAYS INTO THE NOSE 4 (FOUR) TIMES DAILY. USE AS NEEDED FOR POST NASAL DRIP Mejia, Gay Filler, MD Taking Active   levocetirizine (XYZAL) 5 MG tablet [id number] Yes TAKE 1 TABLET BY MOUTH EVERY DAY IN THE EVENING Mejia, Gay Filler, MD Taking Active   levothyroxine (SYNTHROID) 75 MCG tablet [id number] Yes Take 1 tablet (75 mcg total) by mouth daily. Shamleffer, Melanie Crazier, MD Taking Active   metoprolol succinate (TOPROL XL) 25 MG 24 hr tablet [id number] Yes Take  0.5 tablets (12.5 mg total) by mouth daily. Evans Lance, MD Taking Active   polyethylene glycol [facility] / GLYCOLAX) 17 g packet [id number] Yes Take 17 g by mouth daily as needed. [provider] Taking Active   Polyvinyl Alcohol-Povidone (REFRESH OP) [id number] Yes Place 1 drop into both eyes daily as needed (dry eyes). [provider] Taking Active Self  simvastatin (ZOCOR) 20 MG tablet [id number] Yes TAKE 1 TABLET BY MOUTH EVERYDAY AT BEDTIME Mejia, Gay Filler, MD Taking Active   Wheat Dextrin [facility]) POWD [id number] Yes Take 1-2 Scoops by mouth every morning. (Take 1 to 2 teaspoonfuls each morning mixed with beverage of choice) [provider] Taking Active             Patient Active Problem List   Diagnosis Date Noted   Pacemaker 08/25/2020   Osteopenia 01/09/2019   Pansinusitis 03/06/2018   Cough 03/06/2018   Orthostatic hypotension 12/06/2016   Viral URI with cough 04/20/2016   Sinus node dysfunction ([place]) 11/11/2015   Routine general medical examination at a health care facility 06/04/2015   Insomnia with sleep apnea 06/04/2015   Bronchitis, chronic obstructive w acute bronchitis ([place]) 12/10/2014   Lumbar disc disease 09/01/2010   Obstructive sleep apnea 05/31/2007   Hyperlipidemia with target LDL less than 130 02/05/2007   Depression with anxiety 02/05/2007   Allergic rhinitis 02/05/2007   Hypothyroidism 02/02/2007   Essential hypertension 02/02/2007    Immunization History  Administered Date(s) Administered   Influenza Split 10/11/2010, 10/04/2013   Influenza Whole 10/13/2009   Influenza, High Dose Seasonal PF 10/14/2016, 10/22/2017, 10/22/2018   Influenza-Unspecified 10/28/2015, 10/17/2019, 10/20/2020   PFIZER(Purple Top)SARS-COV-2 Vaccination 01/24/2019, 02/11/2019, 10/03/2019, 04/20/2020   Pfizer Covid-19 Vaccine Bivalent Booster 19yr & up 10/20/2020   Pneumococcal Conjugate-13 11/06/2014   Pneumococcal Polysaccharide-23  02/25/2008   Td 02/25/2008   Tdap 10/23/2018   Zoster Recombinat (Shingrix) 04/27/2017, 08/01/2017    Conditions to be addressed/monitored: HTN, HLD, Anxiety, Depression, and OSA; insomnia; lightheadedness; hypothyroidism; osteopenia;   Care Plan : General Pharmacy (Adult)  Updates made by ECherre Robins RPH-CPP since 03/09/2021 12:00 AM     Problem: HTN; depression with anxiety and insomnia; OSA; hypothyroidism; osteopenia; hyperlipidemia; sinus node dysfunciton   Priority: High  Onset Date: 08/11/2020     Long-Range Goal: Pharmacy care plan for chronic conditions and medication management   Start Date: 08/11/2020  Recent Progress: On track  Priority: High  Note:  Current Barriers:  Experiencing lightheadedness - possibly related to medication adverse effect (improved since telmisartan stopped) Has been unable to participate in usual exercise  Experiencing burning in feet Current therapy for sleeplessness not effective.   Pharmacist Clinical Goal(s):  Over the next 90 days, patient will maintain control of hypertension as evidenced by BP <140/90  adhere to prescribed medication regimen as evidenced by refill history Reviewed medication list and identify potential pharmacotherapy causes of lightheadedness.   through collaboration with PharmD and provider. (Goal achieved)  Coordinate with PCP for evaluation of burning in feet Improve quality and length of sleep  Interventions: 1:1 collaboration with Mejia, Gay Filler, MD regarding development and update of comprehensive plan of care as evidenced by provider attestation and co-signature Inter-disciplinary care team collaboration (see longitudinal plan of care) Comprehensive medication review performed; medication list updated in electronic medical record   Hypertension/ Palpitations: Controlled; BP goal <140/90 Home BP reading: 122/68 Recent HR: 70-85 Patient reports feeling of lightheadedness has improved with discontinuation  of telmisartan. Palpitations better with initiation of metoprolol ER 29m.  Last pacemaker checked showed average HR in th 70's  Current regimen:  Metoprolol ER 248m- take 0.5 tablet - 12.108m9108maily  Previous medications: Amlodipine was stopped 08/06/2020 due to dizziness; telmisartan stopped in 2022 due to dizziness. Interventions: Recommended patient to check blood pressure 2 to 3 times per week and record Continue current therapy for blood pressure and heart rate.  Continue to follow up with cardiology office for pacemaker checks  Hyperlipidemia Controlled;  LDL goal < 100 Exercise: non currently but previously was attending exercise classes regularly at local gym. H[facility] restart [facility].  Current regimen:  Simvastatin 48m90mily Interventions: Discussed LDL goals  Maintain cholesterol medication regimen.   Anxiety / Depression / insomnia: Controlled; Goal:  minimize anxiety and depression symptoms. Also limit potential side effects from medication therapy Current regimen:  Fluoxetine 48mg78make 1 tablet daily for 2 weeks, then increase to 2 tablets = 40mg 13my (patient report she is only taking once daily) Clonazepam 0.108mg - 109me 0.5 tablet at bedtime as needed for sleep  Fluoxetine was started in May 2022. Patient is unsure is she still need to take. At the time it was started she was experiencing several life changes - retirement, death of her mother, decrease in her husband's health. At our last appointment she expressed wishes to taper dose but patient has not tapered dose. [facility] is [facility]. She is concerned about stress related to move. Recommended she continue current dose of fluoxetine.   Meds tried in past: bupropion, sertraline, Effexor, citalopram (patient was unsure why stopped and no reason for discontinuation documentation available in chart) Interventions: Continue to take  fluoxetine 48mg da4mDiscussed possible alternatives to fluoxetine in future. Could consider escitalopram 10mg dai74mIf change is made, recommend 7 day washout of fluoxetine and then start escitalopram 10mg dail54mOsteopenia Goal: reduce risk of fracture due to osteopenia Last DEXA 01/08/2019: Left Forearm Radius T-score 01/08/2019 =  -2.2  Left Forearm Radius T-score 04/21/2016 =  -1.8  Femur Neck Right T-score 01/08/2019 = -1.8  Femur Neck Right T-score 04/21/2016 = -1.4  Frax 10 Year Risk:   Major Osteoporotic Fracture: 21.7% Hip Fracture:   12.1% Population:  [place] Risk Factors: Family Hist. (Parent hip fracture), Secondary Osteoporosis Current regimen:  Vitamin D 2000 units every day  Tums 520m as needed Prolia 6109mevery 6 months - last 11/29/2020 (next due 06/08/2021) Interventions: Recommend continue Prolia, vitamin D and Tums Due to recheck bone density  - appointment 04/28/2021  Fatigue / Burning in Feet:  Patient has complained of burning in feet at last appointment in January 2023.  Consulted with PCP and Dr CoLorelei Pontas recommended either appointment or have labs to r/o B12 deficiency or iron deficiency Patient has initially stated she would make appointment with Dr CoLorelei Pontut she has not yet due to many other medical appointments.  Interventions:  Patient would like to get labs r/o B12 deficiency or iron deficiency. Send message with request to Dr CoLorelei Pont  Sleeplessness:  Current therapy:  Clonazepam 0.32m80m take 0.5 tablet at bedtime if needed Patient would like to try something over-the-counter for sleep if available.  Intervention:  Suggested trial of melatonin 3mg2m bedtime (make take up to 10mg59mbedtime) Sleep hygiene discussed.   Medication management Pharmacist Clinical Goal(s): Over the next 90 days, patient will work with PharmD and providers to maintain optimal medication adherence Current pharmacy: [facility] Comprehensive  medication review performed. Continue current medication management strategy Patient self care activities - Over the next 90 days, patient will: Focus on medication adherence by filling and taking medications appropriately  Take medications as prescribed Report any questions or concerns to PharmD and/or provider(s)  Patient Goals/Self-Care Activities Over the next 90 days, patient will:  take medications as prescribed,  check blood pressure  and heart rate 2 to 3 times per week , document, and provide at future appointments, and  Restart regular exercise as able and work up to goal of 150 minutes per week.  Trial of melatonin 3mg a68medtime (may increase if needed to max of 10mg /57mht) We are checking with Dr CoplandLorelei Pontgetting labs. Call our office if you have not heard from [place] lab appointment by 03/16/2021  Follow Up Plan: Telephone follow up appointment with care management team member scheduled for:  4 weeks to check sleep and mood           Medication Assistance: None required.  Patient affirms current coverage meets needs.  Patient's preferred pharmacy is:  [facility] [address] [phone] [place] [phone] [id number] - [phone] Up:  Patient agrees to Care Plan and Follow-up.  Plan: Telephone follow up appointment with care management team member scheduled for:  4 weeks  Chelcea Zahn EcCherre Robins Clinical Pharmacist [place] [facility]

## 2021-03-09 NOTE — Patient Instructions (Signed)
Mrs. Dupree  It was a pleasure speaking with you today.  I have attached a summary of our visit today and information about your health goals. (See below)  If you have any questions or concerns, please feel free to contact me either at the phone number below or with a MyChart message.   Keep up the good work!  Cherre Robins, PharmD Clinical Pharmacist [facility] Primary Care [facility] [phone] (direct line)  [phone] (main office number)    Chronic Care Management CARE PLAN (updated 03/09/2021)     Hypertension BP Readings from Last 3 Encounters:  08/25/20 120/72  08/06/20 110/70  07/14/20 126/80   Pharmacist Clinical Goal(s): Over the next 90 days, patient will work with PharmD and providers to maintain BP goal <140/90 Current regimen:  Metoprolol ER 25mg  - take 0.5 tablet = 12.5mg  daily  Interventions: Requested patient to check blood pressure 2 to 3 times per week and record Also check blood pressure when feeling lightheaded Patient self care activities - Over the next 90 days, patient will: Check blood pressure 2 to 3 times per week and when feeling lightheaded, document, and provide at future appointments Ensure daily salt intake < 2300 mg/day  Hyperlipidemia Lab Results  Component Value Date/Time   LDLCALC 72 10/24/2019 10:20 AM   LDLDIRECT 148.3 09/01/2010 10:41 AM   Pharmacist Clinical Goal(s): Over the next 90 days, patient will work with PharmD and providers to achieve LDL goal < 100 Current regimen:  Simvastatin 20mg  daily Interventions: Discussed LDL goals  Patient self care activities - Over the next 90 days, patient will: Maintain cholesterol medication regimen.   Anxiety / Depression / sleeplessness: Pharmacist Clinical Goal(s): Over the next 90 days, patient will work with PharmD and providers to minimize anxiety and depression symptoms. Also limit potential side effects from medication therapy Current regimen:  Fluoxetine 20mg   - take 1 tablet daily for 2 weeks, then increase to 2 tablets = 40mg  daily (patient report she is only taking once daily) Clonazepam 0.5mg  - take 0.5 tablet at bedtime as needed for sleep  Interventions: Discussed timing of onset of lightheadedness and change to fluoxetine - could be related.  Recommended trial of melatonin for sleep Patient self care activities - Over the next 90 days, patient will: Continue to take fluoxetine 20mg  daily in the evening Try melatonin 3mg  at bedtime for sleep - may increase to maximum of 10mg  at bedtime if needed.  Will follow up with you in about 4 weeks to see how you are doing.   Osteopenia Pharmacist Clinical Goal(s) Over the next 90 days, patient will work with PharmD and providers to reduce risk of fracture due to osteopenia Current regimen:  Vitamin D 2000 units every day Tums 500mg  as needed Prolia 60mg  - received injection every 6 months. Interventions: Next Prolia due 06/08/2021 or after Patient self care activities - Over the next 90 days, patient will: Continue Prolia, vitamin D and Tums Due to recheck bone density 04/28/2021  Fatigue / Burning in Feet:  Patient has complained of burning in feet at last appointment in January 2023.  Interventions:  Patient would like to get labs to check for B12 deficiency or iron deficiency. Sent message with request to Dr Lorelei Pont.    Medication management Pharmacist Clinical Goal(s): Over the next 90 days, patient will work with PharmD and providers to maintain optimal medication adherence [facility] Comprehensive medication review performed. Continue current medication management strategy Patient self care activities -  Over the next 90 days, patient will: Focus on medication adherence by filling and taking medications appropriately  Take medications as prescribed Report any questions or concerns to PharmD and/or provider(s)  Patient Goals/Self-Care Activities Over the  next 90 days, patient will:  take medications as prescribed,  check blood pressure  and heart rate 2 to 3 times per week , document, and provide at future appointments, and  Restart regular exercise as able and work up to goal of 150 minutes per week.  Trial of melatonin 3mg  at bedtime (may increase if needed to max of 10mg  / night) We are checking with Dr Lorelei Pont about getting labs. Call our office if you have not heard from [name] about a lab appointment by 03/16/2021  Patient verbalizes understanding of instructions and care plan provided today and agrees to view in [facility]. Active MyChart status confirmed with patient.

## 2021-03-10 ENCOUNTER — Other Ambulatory Visit: Payer: Self-pay | Admitting: Family Medicine

## 2021-03-10 ENCOUNTER — Encounter: Payer: Self-pay | Admitting: Family Medicine

## 2021-03-10 DIAGNOSIS — R208 Other disturbances of skin sensation: Secondary | ICD-10-CM

## 2021-03-12 ENCOUNTER — Other Ambulatory Visit (INDEPENDENT_AMBULATORY_CARE_PROVIDER_SITE_OTHER): Payer: PPO

## 2021-03-12 DIAGNOSIS — R208 Other disturbances of skin sensation: Secondary | ICD-10-CM

## 2021-03-13 ENCOUNTER — Encounter: Payer: Self-pay | Admitting: Family Medicine

## 2021-03-13 LAB — FERRITIN: Ferritin: 44 ng/mL (ref 16–288)

## 2021-03-13 LAB — VITAMIN B12: Vitamin B-12: 1792 pg/mL — ABNORMAL HIGH (ref 200–1100)

## 2021-03-19 DIAGNOSIS — H04123 Dry eye syndrome of bilateral lacrimal glands: Secondary | ICD-10-CM | POA: Diagnosis not present

## 2021-03-19 DIAGNOSIS — H52203 Unspecified astigmatism, bilateral: Secondary | ICD-10-CM | POA: Diagnosis not present

## 2021-03-19 DIAGNOSIS — H2512 Age-related nuclear cataract, left eye: Secondary | ICD-10-CM | POA: Diagnosis not present

## 2021-03-19 DIAGNOSIS — Z961 Presence of intraocular lens: Secondary | ICD-10-CM | POA: Diagnosis not present

## 2021-03-23 ENCOUNTER — Other Ambulatory Visit: Payer: Self-pay | Admitting: Family Medicine

## 2021-04-01 ENCOUNTER — Other Ambulatory Visit: Payer: Self-pay | Admitting: Family Medicine

## 2021-04-01 DIAGNOSIS — E785 Hyperlipidemia, unspecified: Secondary | ICD-10-CM

## 2021-04-06 ENCOUNTER — Ambulatory Visit (INDEPENDENT_AMBULATORY_CARE_PROVIDER_SITE_OTHER): Payer: PPO | Admitting: Pharmacist

## 2021-04-06 DIAGNOSIS — E785 Hyperlipidemia, unspecified: Secondary | ICD-10-CM

## 2021-04-06 DIAGNOSIS — F5101 Primary insomnia: Secondary | ICD-10-CM

## 2021-04-06 DIAGNOSIS — R5382 Chronic fatigue, unspecified: Secondary | ICD-10-CM

## 2021-04-06 DIAGNOSIS — M810 Age-related osteoporosis without current pathological fracture: Secondary | ICD-10-CM

## 2021-04-06 DIAGNOSIS — I10 Essential (primary) hypertension: Secondary | ICD-10-CM

## 2021-04-06 DIAGNOSIS — F418 Other specified anxiety disorders: Secondary | ICD-10-CM

## 2021-04-06 NOTE — Patient Instructions (Signed)
Theresa Mejia ?It was a pleasure speaking with you  ?Below is a summary of your health goals and care plan ? ?You are doing great so I am not going to set a follow up appointment but please know you can reach out to me anytime at [facility] if you have medication questions or concerns.  ? ?Keep up the good work and best wishes with your upcoming move!  ? ?Cherre Robins, PharmD ?Clinical Pharmacist ?[facility] ?[place] ?[phone] (direct line)  ?[phone] (main office number) ? ?Chronic Care Management CARE PLAN ? ?Hypertension ?BP Readings from Last 3 Encounters:  ?08/25/20 120/72  ?08/06/20 110/70  ?07/14/20 126/80  ? ?Pharmacist Clinical Goal(s): ?Over the next 90 days, patient will work with PharmD and providers to maintain BP goal <140/90 ?Current regimen:  ?Metoprolol ER '25mg'$  - take 0.5 tablet = 12.'5mg'$  daily  ?Interventions: ?Requested patient to check blood pressure 2 to 3 times per week and record ?Also check blood pressure when feeling lightheaded ?Patient self care activities - Over the next 90 days, patient will: ?Check blood pressure 2 to 3 times per week and when feeling lightheaded, document, and provide at future appointments ?Ensure daily salt intake < 2300 mg/day ? ?Hyperlipidemia ?Lab Results  ?Component Value Date/Time  ? LDLCALC 72 10/24/2019 10:20 AM  ? LDLDIRECT 148.3 09/01/2010 10:41 AM  ? ?Pharmacist Clinical Goal(s): ?Over the next 90 days, patient will work with PharmD and providers to achieve LDL goal < 100 ?Current regimen:  ?Simvastatin '20mg'$  daily ?Interventions: ?Discussed LDL goals  ?Patient self care activities - Over the next 90 days, patient will: ?Maintain cholesterol medication regimen.  ? ?Anxiety / Depression / sleeplessness: ?Pharmacist Clinical Goal(s): ?Over the next 90 days, patient will work with PharmD and providers to minimize anxiety and depression symptoms. Also limit potential side effects from medication  therapy ?Current regimen:  ?Fluoxetine '20mg'$  - take 1 tablet twice a day (total daily dose = '40mg'$ ) ?Clonazepam 0.'5mg'$  - take 0.5 tablet at bedtime as needed for sleep  ?Interventions: ?Discussed timing of onset of lightheadedness and change to fluoxetine - could be related.  ?Recommended trial of melatonin for sleep ?Patient self care activities - Over the next 90 days, patient will: ?Continue to take fluoxetine and clonazepam sparingly if needed for sleep ? ?Osteopenia ?Pharmacist Clinical Goal(s) ?Over the next 90 days, patient will work with PharmD and providers to reduce risk of fracture due to osteopenia ?Current regimen:  ?Vitamin D 2000 units every day ?Tums '500mg'$  as needed ?Prolia '60mg'$  - received injection every 6 months. ?Interventions: ?Next Prolia due 06/08/2021 or after ?Patient self care activities - Over the next 90 days, patient will: ?Continue Prolia, vitamin D and Tums ?Due to recheck bone density 04/28/2021 ? ?hemorrhoids / constipation:  ?Patient to see Dr Earlean Shawl in May for hemorrhoids. Will have banding procedure. ?Continue to use Benefiber daily and Miralax as needed for constipation ? ?Medication management ?Pharmacist Clinical Goal(s): ?Over the next 90 days, patient will work with PharmD and providers to maintain optimal medication adherence ?Current [facility] ?Interventions ?Comprehensive medication review performed. ?Continue current medication management strategy ?Patient self care activities - Over the next 90 days, patient will: ?Focus on medication adherence by filling and taking medications appropriately  ?Take medications as prescribed ?Report any questions or concerns to PharmD and/or provider(s) ? ?Patient Goals/Self-Care Activities ?Over the next 90 days, patient will:  ?take medications as prescribed,  ?check blood pressure  and heart  rate 2 to 3 times per week , document, and provide at future appointments, and  ?Recommend regular exercise as able and work up to goal of 150  minutes per week.  ? ?Follow Up Plan: No further follow up required: patient is doing well. She is aware she can reactivate Chronic Care Management services if needed and has phone number of clinical pharmacist if needed.     ? ?Patient verbalizes understanding of instructions and care plan provided today and agrees to view in [place]. Active MyChart status confirmed with patient.    ?

## 2021-04-06 NOTE — Chronic Care Management (AMB) (Signed)
? ? ?Chronic Care Management ?Pharmacy Note ? ?04/06/2021 ?Name:  Theresa Mejia MRN:  [id number] DOB:  [birth year] ? ?Summary:  ?Fatigue / Burning in Feet: Continues to have fatigue and burning in her feet off and on. Has been ongoing for years and does not limit her activity. Patient to follow up with PCP as needed.  ? ?Sleeplessness: Current therapy: Clonazepam 0.54m - take 0.5 tablet at bedtime if needed. She tried melatonin but 387mmade her too sleepy and 1.78m24maused "bad dreams" so she stopped.  ? ?Subjective: ?Theresa Mejia an [age]o. year old female who is a primary patient of Copland, JesGay FillerD.  The CCM team was consulted for assistance with disease management and care coordination needs.   ? ?Engaged with patient by telephone for follow up visit in response to provider referral for pharmacy case management and/or care coordination services.  ? ?Consent to Services:  ?The patient was given information about Chronic Care Management services, agreed to services, and gave verbal consent prior to initiation of services.  Please see initial visit note for detailed documentation.  ? ?Patient Care Team: ?Copland, JesGay FillerD as PCP - General (Family Medicine) ?YouDeneise LeverD (Pulmonary Disease) ?AluGaynelle ArabianD (Orthopedic Surgery) ?MedRichmond CampbellD (Gastroenterology) ?EckCherre RobinsPH-CPP (Pharmacist) ? ?Recent office visits: ?12/09/2020 - Prolia injection ?08/06/2020 - PCP (Dr CopLorelei Pontightheadedness / low BP. Recommended hold amlodipine. CBC, TSH and vit D all WNL. ? ? ?Recent consult visits: ?03/04/2021 - [facility] 02/23/2021, internal hemorrhoids and incomplete fecal evacuations. Plan: Benefiber 1-2 tsp QAM; MiraLAX titrated to effect QHS. Schedule banding with Dr. MedEarlean Shawl2/14/2023 - [facility] ([facility] bleeding x 3 occasions. Recommended office visit.  ?12/08/2020 - Ortho surgery (Dr AluWynelle Linkeen for radiculopathy,  lumbar ?11/12/2020 - Ortho surgery (Dr AluWynelle Linkeen for low back pain and left hip pain.  ?11/10/2020 - Cardio (Dr TayLovena Le/U palpitations. Improved with metoprolol  ?11/09/2020 - Opthalmology (Dr LylPrudencio Burlyeen for dry eye syndrome and monitoring of cataract.  ? ? ?Hospital visits: ?None in lat 6 months ? ?Objective: ? ?Lab Results  ?Component Value Date  ? CREATININE 0.86 08/06/2020  ? CREATININE 0.77 01/06/2020  ? CREATININE 0.84 08/01/2019  ? ? ?Lab Results  ?Component Value Date  ? HGBA1C 5.3 10/24/2019  ? ?Last diabetic Eye exam: No results found for: HMDIABEYEEXA  ?Last diabetic Foot exam: No results found for: HMDIABFOOTEX  ? ?   ?Component Value Date/Time  ? CHOL 126 10/24/2019 1020  ? TRIG 91 10/24/2019 1020  ? HDL 37 (L) 10/24/2019 1020  ? CHOLHDL 3.4 10/24/2019 1020  ? VLDL 33.6 10/22/2018 1516  ? LDLCALC 72 10/24/2019 1020  ? LDLDIRECT 148.3 09/01/2010 1041  ? ? ? ?  Latest Ref Rng & Units 08/06/2020  ?  4:09 PM 01/06/2020  ? 10:54 AM 08/01/2019  ? 10:58 AM  ?Hepatic Function  ?Total Protein 6.0 - 8.3 g/dL 6.5   7.0   7.2    ?Albumin 3.5 - 5.2 g/dL 4.2   4.6   4.4    ?AST 0 - 37 U/L 14   14   18     ?ALT 0 - 35 U/L 12   12   17     ?Alk Phosphatase 39 - 117 U/L 34   42   36    ?Total Bilirubin 0.2 - 1.2 mg/dL 0.4  0.6   0.7    ? ? ?Lab Results  ?Component Value Date/Time  ? TSH 0.81 09/10/2020 01:24 PM  ? TSH 0.77 08/06/2020 04:09 PM  ? FREET4 1.06 06/04/2015 02:43 PM  ? ? ? ?  Latest Ref Rng & Units 08/06/2020  ?  4:09 PM 01/06/2020  ? 10:54 AM 08/01/2019  ? 10:58 AM  ?CBC  ?WBC 4.0 - 10.5 K/uL 5.3   5.3   5.1    ?Hemoglobin 12.0 - 15.0 g/dL 12.2   13.1   13.4    ?Hematocrit 36.0 - 46.0 % 36.2   39.3   40.5    ?Platelets 150.0 - 400.0 K/uL 265.0   248.0   242    ? ? ?Lab Results  ?Component Value Date/Time  ? VD25OH 61.59 08/06/2020 04:09 PM  ? VD25OH 69.71 11/03/2016 11:04 AM  ? ? ?Clinical ASCVD: No  ?The ASCVD Risk score (Arnett DK, et al., 2019) failed to calculate for the following reasons: ?  The valid  total cholesterol range is 130 to 320 mg/dL   ? ? ?Social History  ? ?Tobacco Use  ?Smoking Status Former  ? Years: 8.00  ? Types: Cigarettes  ? Quit date: 01/10/1966  ? Years since quitting: 55.2  ?Smokeless Tobacco Never  ? ?BP Readings from Last 3 Encounters:  ?08/25/20 120/72  ?08/06/20 110/70  ?07/14/20 126/80  ? ?Pulse Readings from Last 3 Encounters:  ?08/25/20 70  ?08/06/20 82  ?07/14/20 76  ? ?Wt Readings from Last 3 Encounters:  ?11/17/20 147 lb (66.7 kg)  ?11/10/20 147 lb 3.2 oz (66.8 kg)  ?08/25/20 147 lb 6.4 oz (66.9 kg)  ? ? ?Assessment: Review of patient past medical history, allergies, medications, health status, including review of consultants reports, laboratory and other test data, was performed as part of comprehensive evaluation and provision of chronic care management services.  ? ?SDOH:  (Social Determinants of Health) assessments and interventions performed:  ?SDOH Interventions   ? ?Flowsheet Row Most Recent Value  ?SDOH Interventions   ?Physical Activity Interventions Other (Comments)  [has started exercise at [facility]. Will go more frequently when moves to [facility] in June 2023.]  ? ?  ? ? ? ?[place] ? ?Allergies  ?Allergen Reactions  ? Ramipril   ?  Unknown reaction  ? Sulfa Antibiotics   ?  Says it dropped her blood pressure really low.   ? Sulfonamide Derivatives   ?  REACTION: hypotension  ? ? ?Medications Reviewed Today   ? ? Reviewed by Cherre Robins, RPH-CPP (Pharmacist) on 04/06/21 at [phone]  Med List Status: <None>  ? ?Medication Order Taking? Sig Documenting Provider Last Dose Status Informant  ?calcium carbonate (TUMS - DOSED IN MG ELEMENTAL CALCIUM) 500 MG chewable tablet [id number] Yes Chew 1 tablet by mouth 4 (four) times daily as needed for indigestion or heartburn. [provider] Taking Active Self  ?Cholecalciferol (VITAMIN D3) 50 MCG (2000 UT) TABS [id number] Yes Take 2,000 Units by mouth daily. [provider] Taking Active Self  ?clonazePAM  (KLONOPIN) 0.5 MG tablet [id number] Yes TAKE 1/2 AT BEDTIME AS NEEDED FOR INSOMNIA, MAY INCREASE TO A WHOLE TABLET IF NEEDED Copland, Gay Filler, MD Taking Active   ?denosumab (PROLIA) 60 MG/ML SOSY injection [id number] Yes Inject 60 mg into the skin every 6 (six) months. [provider] Taking Active   ?famotidine (PEPCID) 10 MG tablet [id number] Yes Take 10 mg by mouth as needed for heartburn or indigestion. [provider]  Taking Active   ?FLUoxetine (PROZAC) 20 MG capsule [id number] Yes Take 1 capsule (20 mg total) by mouth daily. Increase to 40 mg after 2 weeks  ?Patient taking differently: Take 20 mg by mouth 2 (two) times daily.  ? Copland, Gay Filler, MD Taking Active   ?fluticasone (FLONASE) 50 MCG/ACT nasal spray [id number] Yes SPRAY 2 SPRAYS INTO EACH NOSTRIL EVERY DAY Copland, Gay Filler, MD Taking Active   ?ipratropium (ATROVENT) 0.03 % nasal spray [id number] Yes PLACE 2 SPRAYS INTO THE NOSE 4 (FOUR) TIMES DAILY. USE AS NEEDED FOR POST NASAL DRIP Copland, Gay Filler, MD Taking Active   ?levocetirizine (XYZAL) 5 MG tablet [id number] Yes TAKE 1 TABLET BY MOUTH EVERY DAY IN THE EVENING Copland, Gay Filler, MD Taking Active   ?levothyroxine (SYNTHROID) 75 MCG tablet [id number] Yes Take 1 tablet (75 mcg total) by mouth daily. Shamleffer, Melanie Crazier, MD Taking Active   ?metoprolol succinate (TOPROL XL) 25 MG 24 hr tablet [id number] Yes Take 0.5 tablets (12.5 mg total) by mouth daily. Evans Lance, MD Taking Active   ?polyethylene glycol (MIRALAX / GLYCOLAX) 17 g packet [id number] Yes Take 17 g by mouth daily as needed. [provider] Taking Active   ?Polyvinyl Alcohol-Povidone (REFRESH OP) [id number] Yes Place 1 drop into both eyes daily as needed (dry eyes). [provider] Taking Active Self  ?simvastatin (ZOCOR) 20 MG tablet [id number] Yes TAKE 1 TABLET BY MOUTH EVERYDAY AT BEDTIME Copland, Gay Filler, MD Taking Active   ?Wheat Dextrin (BENEFIBER) POWD [id number] Yes Take 1-2  [place] by mouth every morning. (Take 1 to 2 teaspoons each morning mixed with beverage of choice) [provider] Taking Active   ? ?  ?  ? ?  ? ? ?Patient Active Problem List  ? Diagnosis Date Noted

## 2021-04-09 DIAGNOSIS — F32A Depression, unspecified: Secondary | ICD-10-CM

## 2021-04-09 DIAGNOSIS — M8580 Other specified disorders of bone density and structure, unspecified site: Secondary | ICD-10-CM

## 2021-04-09 DIAGNOSIS — E785 Hyperlipidemia, unspecified: Secondary | ICD-10-CM | POA: Diagnosis not present

## 2021-04-09 DIAGNOSIS — I10 Essential (primary) hypertension: Secondary | ICD-10-CM

## 2021-04-22 ENCOUNTER — Telehealth: Payer: Self-pay

## 2021-04-22 NOTE — Telephone Encounter (Signed)
Last Prolia inj 12/09/20 ?Next Prolia inj due 06/09/21 ?

## 2021-04-28 ENCOUNTER — Encounter: Payer: Self-pay | Admitting: Family Medicine

## 2021-04-28 ENCOUNTER — Ambulatory Visit
Admission: RE | Admit: 2021-04-28 | Discharge: 2021-04-28 | Disposition: A | Discharge status: Home / Self Care | Payer: PPO | Source: Ambulatory Visit | Attending: Family Medicine | Admitting: Family Medicine

## 2021-04-28 DIAGNOSIS — Z780 Asymptomatic menopausal state: Secondary | ICD-10-CM

## 2021-04-28 DIAGNOSIS — M8589 Other specified disorders of bone density and structure, multiple sites: Secondary | ICD-10-CM | POA: Diagnosis not present

## 2021-05-04 ENCOUNTER — Telehealth: Payer: Self-pay | Admitting: Family Medicine

## 2021-05-04 NOTE — Telephone Encounter (Signed)
Pt dropped off document to be filled out by provider ([employer]- 1 page) Pt would like document to be mailed out to home address when document ready. Document put at front office tray under providers name.  ?

## 2021-05-05 NOTE — Telephone Encounter (Signed)
Done for pt and her husband  ?

## 2021-05-09 ENCOUNTER — Other Ambulatory Visit: Payer: Self-pay | Admitting: Internal Medicine

## 2021-05-09 ENCOUNTER — Other Ambulatory Visit: Payer: Self-pay | Admitting: Family Medicine

## 2021-05-09 DIAGNOSIS — E785 Hyperlipidemia, unspecified: Secondary | ICD-10-CM

## 2021-05-11 NOTE — Progress Notes (Addendum)
Therapist, music at [facility] ?[address], [address] ?[place], [place] [address] ?336 [phone] ?Fax [phone] ? ?Date:  05/13/2021  ? ?Name:  Theresa Mejia   DOB:  Oct 20, 1940   MRN:  [id number] ? ?PCP:  Darreld Mclean, MD  ? ? ?Chief Complaint: Fall (Pt states she is having very vivid dreams that are causing her to fall. She has had 3 in the last month. This last one she fell off of her bed- last Friday. /Concerns/ questions: none/) ? ? ?History of Present Illness: ? ?Theresa Mejia is a 81 y.o. very pleasant female patient who presents with the following: ? ?Patient seen today with concern of sleep disturbance ?Most recent visit with myself was in July when she was feeling lightheaded ? ?She has a pacemaker, placed in 2017 for sick sinus syndrome-also history of sleep apnea, hypothyroidism, hyperlipidemia, hypertension ? ?A week ago she was having a very vivid dream and fell out of bed, she hit her chin on the ground and has significant bruising, she cut her lip slightly ?She thinks her teeth are ok  ?- she has had these vivid dreams where she may talk or scream in her sleep for years, potentially getting worse ? ?She notes she feel out of bed 3x over the last month due to these dreams and had the injury described above -otherwise she has not injured herself.  She has not left the house or engaged in any more extreme behaviors while asleep ? ?Otherwise she notes a long history of sleep difficulty ?She feels tired a lot, feels like she could go to sleep anytime ?However she has difficulty getting to sleep when she gets in bed at night ?Once asleep she stays asleep pretty well ?Nocturia x1 ?Her husband may partially wake her up if he notes she seems to be having a bad dream or is talking/moaning in her sleep-by gently rubbing her arm he is oftentimes able to get her out of the drain before it escalates ? ?They are moving to [facility] soon - ?She is not taking klonpin at bedtime-however she does  have this on hand ?She is taking 40 mg of prozac daily- no change here ?She is not using alcohol.  She may occasionally uses CBD gummy, but they have not noted any change in her sleep symptoms with use ? ?She was on CPAP in the past but is not using now ?She did do a sleep study a couple of times, once per pulmonology and once per neurology.  It looks like they noted sleep apnea without hypoxia, she did try CPAP but it did not seem to improve her symptoms so she stopped using it ? ?Also, she notes a feeling of incomplete emptying with bowel movements.  She is seeing her gastroenterologist soon just to have hemorrhoidal banding.  Her colonoscopy is up-to-date.  She has tried using fiber and MiraLAX, but continues to have a feeling that she is not emptying her bowels fully ?Patient Active Problem List  ? Diagnosis Date Noted  ? Pacemaker 08/25/2020  ? Osteopenia 01/09/2019  ? Pansinusitis 03/06/2018  ? Cough 03/06/2018  ? Orthostatic hypotension 12/06/2016  ? Sinus node dysfunction ([place]) 11/11/2015  ? Routine general medical examination at a health care facility 06/04/2015  ? Insomnia with sleep apnea 06/04/2015  ? Bronchitis, chronic obstructive w acute bronchitis ([place]) 12/10/2014  ? Lumbar disc disease 09/01/2010  ? Obstructive sleep apnea 05/31/2007  ? Hyperlipidemia with target LDL  less than 130 02/05/2007  ? Depression with anxiety 02/05/2007  ? Allergic rhinitis 02/05/2007  ? Hypothyroidism 02/02/2007  ? Essential hypertension 02/02/2007  ? ? ?Past Medical History:  ?Diagnosis Date  ? Allergic rhinitis   ? Depression   ? DJD (degenerative joint disease)   ? GERD (gastroesophageal reflux disease)   ? Headache(784.0)   ? sinus  ? Hypercholesteremia   ? Hypersomnia   ? Hypertension   ? Hypothyroidism   ? Internal hemorrhoid 11/2008  ? s/p banding ([place])  ? Lumbar disc disease   ? OBSTRUCTIVE SLEEP APNEA   ? NPSG 04/29/07- AHI 1.1/hr, RDI 21.2/hr. Weight 176 lbs. CPAP was tried based on the RDI.    ? OVERACTIVE  BLADDER   ? pt unaware  ? Presence of permanent cardiac pacemaker 11/11/2015  ? Reactive depression (situational)   ? Sinus node dysfunction (HCC)   ? Spondylosis, cervical   ? ? ?Past Surgical History:  ?Procedure Laterality Date  ? BIOPSY BREAST    ? BREAST EXCISIONAL BIOPSY Left 2011  ? CATARACT EXTRACTION W/ INTRAOCULAR LENS IMPLANT Right   ? COLONOSCOPY    ? EP IMPLANTABLE DEVICE N/A 11/11/2015  ? Procedure: Pacemaker Implant;  Surgeon: Evans Lance, MD;  Location: [facility];  Service: Cardiovascular;  Laterality: N/A;  ? eye lid lift    ? INSERT / REPLACE / REMOVE PACEMAKER  11/11/2015  ? KNEE ARTHROSCOPY    ? Dr. [name]  ? MOUTH SURGERY  08/25/2017  ? implant fell out due to bone loss  ? REVERSE SHOULDER ARTHROPLASTY Right 08/08/2019  ? Procedure: REVERSE SHOULDER ARTHROPLASTY;  Surgeon: Tania Ade, MD;  Location: [facility];  Service: Orthopedics;  Laterality: Right;  ? rotator cuff surgery Right   ? Dr. [name]  ? TUBAL LIGATION    ? ? ?Social History  ? ?Tobacco Use  ? Smoking status: Former  ?  Years: 8.00  ?  Types: Cigarettes  ?  Quit date: 01/10/1966  ?  Years since quitting: 55.3  ? Smokeless tobacco: Never  ?Vaping Use  ? Vaping Use: Never used  ?Substance Use Topics  ? Alcohol use: No  ? Drug use: No  ? ? ?Family History  ?Problem Relation Age of Onset  ? Crohn's disease Father 67  ? Macular degeneration Mother   ? Cancer Mother 76  ?     bladder, never confirmed with biospy  ? Hypothyroidism Mother   ? Tremor Mother   ? Stroke Mother 47  ? Parkinsonism Maternal Grandmother   ? Alzheimer's disease Paternal Aunt   ? Alzheimer's disease Paternal Uncle   ? Diabetes Paternal Grandmother   ? Colon cancer Neg Hx   ? Esophageal cancer Neg Hx   ? Rectal cancer Neg Hx   ? ? ?Allergies  ?Allergen Reactions  ? Ramipril   ?  Unknown reaction  ? Sulfa Antibiotics   ?  Says it dropped her blood pressure really low.   ? Sulfonamide Derivatives   ?  REACTION: hypotension  ? ? ?Medication list has been  reviewed and updated. ? ?Current Outpatient Medications on File Prior to Visit  ?Medication Sig Dispense Refill  ? calcium carbonate (TUMS - DOSED IN MG ELEMENTAL CALCIUM) 500 MG chewable tablet Chew 1 tablet by mouth 4 (four) times daily as needed for indigestion or heartburn.    ? Cholecalciferol (VITAMIN D3) 50 MCG (2000 UT) TABS Take 2,000 Units by mouth daily.    ? clonazePAM (KLONOPIN)  0.5 MG tablet TAKE 1/2 AT BEDTIME AS NEEDED FOR INSOMNIA, MAY INCREASE TO A WHOLE TABLET IF NEEDED 30 tablet 1  ? denosumab (PROLIA) 60 MG/ML SOSY injection Inject 60 mg into the skin every 6 (six) months.    ? famotidine (PEPCID) 10 MG tablet Take 10 mg by mouth as needed for heartburn or indigestion.    ? FLUoxetine (PROZAC) 20 MG capsule Take 1 capsule (20 mg total) by mouth daily. Increase to 40 mg after 2 weeks (Patient taking differently: Take 20 mg by mouth 2 (two) times daily.) 180 capsule 3  ? fluticasone (FLONASE) 50 MCG/ACT nasal spray SPRAY 2 SPRAYS INTO EACH NOSTRIL EVERY DAY 48 mL 3  ? ipratropium (ATROVENT) 0.03 % nasal spray PLACE 2 SPRAYS INTO THE NOSE 4 (FOUR) TIMES DAILY. USE AS NEEDED FOR POST NASAL DRIP 30 mL 6  ? levocetirizine (XYZAL) 5 MG tablet TAKE 1 TABLET BY MOUTH EVERY DAY IN THE EVENING 90 tablet 1  ? levothyroxine (SYNTHROID) 75 MCG tablet TAKE 1 TABLET BY MOUTH EVERY DAY 90 tablet 1  ? metoprolol succinate (TOPROL XL) 25 MG 24 hr tablet Take 0.5 tablets (12.5 mg total) by mouth daily. 45 tablet 3  ? polyethylene glycol (MIRALAX / GLYCOLAX) 17 g packet Take 17 g by mouth daily as needed.    ? Polyvinyl Alcohol-Povidone (REFRESH OP) Place 1 drop into both eyes daily as needed (dry eyes).    ? Wheat Dextrin (BENEFIBER) POWD Take 1-2 Scoops by mouth every morning. (Take 1 to 2 teaspoons each morning mixed with beverage of choice)    ? ?No current facility-administered medications on file prior to visit.  ? ? ?Review of Systems: ? ?As per HPI- otherwise negative. ? ? ?Physical Examination: ?Vitals:  ?  05/13/21 0910  ?BP: 122/80  ?Pulse: 76  ?Resp: 18  ?Temp: 98 ?F (36.7 ?C)  ?SpO2: 97%  ? ?There were no vitals filed for this visit. ?There is no height or weight on file to calculate BMI. ?Ideal Body Weight

## 2021-05-11 NOTE — Patient Instructions (Addendum)
It was good to see you again today ?We will get labs and an x-ray of your belly ?For sleep- ok to try a 1/2 or whole of your clonazepam at bedtime- let me know if this is helpful for you. If worse stop using it!  It may also be helpful to decrease or change your prozac- can cut down to 20 mg if you like ? ?We may want to have you also follow-up with neurology for your sleep concerns-  ?[facility] ?Address: [address], [place], [place] [address] ?Phone: [phone] ?

## 2021-05-13 ENCOUNTER — Ambulatory Visit (INDEPENDENT_AMBULATORY_CARE_PROVIDER_SITE_OTHER): Payer: PPO | Admitting: Family Medicine

## 2021-05-13 ENCOUNTER — Ambulatory Visit (HOSPITAL_BASED_OUTPATIENT_CLINIC_OR_DEPARTMENT_OTHER)
Admission: RE | Admit: 2021-05-13 | Discharge: 2021-05-13 | Disposition: A | Discharge status: Home / Self Care | Payer: PPO | Source: Ambulatory Visit | Attending: Family Medicine | Admitting: Family Medicine

## 2021-05-13 ENCOUNTER — Encounter: Payer: Self-pay | Admitting: Family Medicine

## 2021-05-13 VITALS — BP 122/80 | HR 76 | Temp 98.0°F | Resp 18

## 2021-05-13 DIAGNOSIS — I10 Essential (primary) hypertension: Secondary | ICD-10-CM

## 2021-05-13 DIAGNOSIS — E785 Hyperlipidemia, unspecified: Secondary | ICD-10-CM | POA: Diagnosis not present

## 2021-05-13 DIAGNOSIS — F514 Sleep terrors [night terrors]: Secondary | ICD-10-CM

## 2021-05-13 DIAGNOSIS — E038 Other specified hypothyroidism: Secondary | ICD-10-CM

## 2021-05-13 DIAGNOSIS — Z131 Encounter for screening for diabetes mellitus: Secondary | ICD-10-CM | POA: Diagnosis not present

## 2021-05-13 DIAGNOSIS — D259 Leiomyoma of uterus, unspecified: Secondary | ICD-10-CM | POA: Diagnosis not present

## 2021-05-13 DIAGNOSIS — M47816 Spondylosis without myelopathy or radiculopathy, lumbar region: Secondary | ICD-10-CM | POA: Diagnosis not present

## 2021-05-13 DIAGNOSIS — Z8719 Personal history of other diseases of the digestive system: Secondary | ICD-10-CM | POA: Diagnosis not present

## 2021-05-13 DIAGNOSIS — K5904 Chronic idiopathic constipation: Secondary | ICD-10-CM

## 2021-05-13 DIAGNOSIS — K5900 Constipation, unspecified: Secondary | ICD-10-CM | POA: Diagnosis not present

## 2021-05-13 LAB — CBC
HCT: 40.5 % (ref 36.0–46.0)
Hemoglobin: 13.3 g/dL (ref 12.0–15.0)
MCHC: 32.8 g/dL (ref 30.0–36.0)
MCV: 89.8 fl (ref 78.0–100.0)
Platelets: 252 10*3/uL (ref 150.0–400.0)
RBC: 4.51 Mil/uL (ref 3.87–5.11)
RDW: 13.7 % (ref 11.5–15.5)
WBC: 5.1 10*3/uL (ref 4.0–10.5)

## 2021-05-13 LAB — COMPREHENSIVE METABOLIC PANEL
ALT: 14 U/L (ref 0–35)
AST: 16 U/L (ref 0–37)
Albumin: 4.4 g/dL (ref 3.5–5.2)
Alkaline Phosphatase: 36 U/L — ABNORMAL LOW (ref 39–117)
BUN: 25 mg/dL — ABNORMAL HIGH (ref 6–23)
CO2: 29 mEq/L (ref 19–32)
Calcium: 8.8 mg/dL (ref 8.4–10.5)
Chloride: 102 mEq/L (ref 96–112)
Creatinine, Ser: 0.8 mg/dL (ref 0.40–1.20)
GFR: 70.6 mL/min (ref >60.00)
Glucose, Bld: 74 mg/dL (ref 70–99)
Potassium: 4.1 mEq/L (ref 3.5–5.1)
Sodium: 139 mEq/L (ref 135–145)
Total Bilirubin: 0.6 mg/dL (ref 0.2–1.2)
Total Protein: 6.8 g/dL (ref 6.0–8.3)

## 2021-05-13 LAB — HEMOGLOBIN A1C: Hgb A1c MFr Bld: 5.5 % (ref 4.6–6.5)

## 2021-05-13 LAB — LIPID PANEL
Cholesterol: 134 mg/dL (ref 0–200)
HDL: 40.3 mg/dL (ref >39.00)
LDL Cholesterol: 77 mg/dL (ref 0–99)
NonHDL: 93.25
Total CHOL/HDL Ratio: 3
Triglycerides: 82 mg/dL (ref 0.0–149.0)
VLDL: 16.4 mg/dL (ref 0.0–40.0)

## 2021-05-13 LAB — TSH: TSH: 0.9 u[IU]/mL (ref 0.35–5.50)

## 2021-05-13 MED ORDER — SIMVASTATIN 20 MG PO TABS: ORAL_TABLET | ORAL | 3 refills | Status: DC

## 2021-05-13 NOTE — Telephone Encounter (Signed)
Pt ready for scheduling on or after 06/09/21 ? ?Out-of-pocket cost due at time of visit: $276 ? ?Primary: [employer] ?Prolia co-insurance: 20% (approximately $276) ?Admin fee co-insurance: 0%  ? ?Secondary: n/a ?Prolia co-insurance:  ?Admin fee co-insurance:  ? ?Deductible: does not apply ? ?Prior Auth: not required ?PA# ?Valid:  ? ?** This summary of benefits is an estimation of the patient's out-of-pocket cost. Exact cost may vary based on individual plan coverage.  ? ?

## 2021-05-14 ENCOUNTER — Encounter: Payer: Self-pay | Admitting: Family Medicine

## 2021-05-14 NOTE — Telephone Encounter (Signed)
Explained cost the pt. She says she has a friend that gets hers done at the hospital and insurance covers it. Il let her know that I wasn't familiar with this but that I knew some people get it threw Endo but that I would ask if you had ever heard of this.  ?

## 2021-05-19 ENCOUNTER — Encounter: Payer: Self-pay | Admitting: Family Medicine

## 2021-05-19 ENCOUNTER — Ambulatory Visit (INDEPENDENT_AMBULATORY_CARE_PROVIDER_SITE_OTHER): Payer: PPO

## 2021-05-19 DIAGNOSIS — K648 Other hemorrhoids: Secondary | ICD-10-CM | POA: Diagnosis not present

## 2021-05-19 DIAGNOSIS — I495 Sick sinus syndrome: Secondary | ICD-10-CM

## 2021-05-20 LAB — CUP PACEART REMOTE DEVICE CHECK
Date Time Interrogation Session: 20230509093658
Implantable Lead Implant Date: 20171101
Implantable Lead Implant Date: 20171101
Implantable Lead Location: 753859
Implantable Lead Location: 753860
Implantable Lead Model: 377
Implantable Lead Model: 377
Implantable Lead Serial Number: 49553678
Implantable Lead Serial Number: 49617393
Implantable Pulse Generator Implant Date: 20171101
Pulse Gen Model: 394969
Pulse Gen Serial Number: 68817609

## 2021-05-28 DIAGNOSIS — L821 Other seborrheic keratosis: Secondary | ICD-10-CM | POA: Diagnosis not present

## 2021-05-28 DIAGNOSIS — L570 Actinic keratosis: Secondary | ICD-10-CM | POA: Diagnosis not present

## 2021-05-28 DIAGNOSIS — D225 Melanocytic nevi of trunk: Secondary | ICD-10-CM | POA: Diagnosis not present

## 2021-05-31 ENCOUNTER — Ambulatory Visit (INDEPENDENT_AMBULATORY_CARE_PROVIDER_SITE_OTHER): Payer: PPO | Admitting: Family Medicine

## 2021-05-31 ENCOUNTER — Encounter (HOSPITAL_BASED_OUTPATIENT_CLINIC_OR_DEPARTMENT_OTHER): Payer: Self-pay

## 2021-05-31 ENCOUNTER — Emergency Department (HOSPITAL_BASED_OUTPATIENT_CLINIC_OR_DEPARTMENT_OTHER): Payer: PPO

## 2021-05-31 ENCOUNTER — Other Ambulatory Visit: Payer: Self-pay

## 2021-05-31 ENCOUNTER — Emergency Department (HOSPITAL_BASED_OUTPATIENT_CLINIC_OR_DEPARTMENT_OTHER)
Admission: EM | Admit: 2021-05-31 | Discharge: 2021-05-31 | Disposition: A | Discharge status: Home / Self Care | Payer: PPO | Attending: Emergency Medicine | Admitting: Emergency Medicine

## 2021-05-31 ENCOUNTER — Encounter: Payer: Self-pay | Admitting: Family Medicine

## 2021-05-31 VITALS — BP 140/80 | HR 76 | Temp 97.6°F | Resp 18 | Wt 146.2 lb

## 2021-05-31 DIAGNOSIS — R61 Generalized hyperhidrosis: Secondary | ICD-10-CM | POA: Diagnosis not present

## 2021-05-31 DIAGNOSIS — R110 Nausea: Secondary | ICD-10-CM | POA: Diagnosis not present

## 2021-05-31 DIAGNOSIS — R42 Dizziness and giddiness: Secondary | ICD-10-CM

## 2021-05-31 DIAGNOSIS — R197 Diarrhea, unspecified: Secondary | ICD-10-CM | POA: Diagnosis not present

## 2021-05-31 DIAGNOSIS — J00 Acute nasopharyngitis [common cold]: Secondary | ICD-10-CM | POA: Insufficient documentation

## 2021-05-31 DIAGNOSIS — R0789 Other chest pain: Secondary | ICD-10-CM | POA: Diagnosis not present

## 2021-05-31 DIAGNOSIS — I10 Essential (primary) hypertension: Secondary | ICD-10-CM | POA: Diagnosis not present

## 2021-05-31 DIAGNOSIS — R1110 Vomiting, unspecified: Secondary | ICD-10-CM | POA: Diagnosis not present

## 2021-05-31 DIAGNOSIS — R079 Chest pain, unspecified: Secondary | ICD-10-CM | POA: Insufficient documentation

## 2021-05-31 LAB — HEPATIC FUNCTION PANEL
ALT: 19 U/L (ref 0–44)
AST: 23 U/L (ref 15–41)
Albumin: 4.7 g/dL (ref 3.5–5.0)
Alkaline Phosphatase: 41 U/L (ref 38–126)
Bilirubin, Direct: 0.1 mg/dL (ref 0.0–0.2)
Indirect Bilirubin: 0.8 mg/dL (ref 0.3–0.9)
Total Bilirubin: 0.9 mg/dL (ref 0.3–1.2)
Total Protein: 8.1 g/dL (ref 6.5–8.1)

## 2021-05-31 LAB — BASIC METABOLIC PANEL
Anion gap: 11 (ref 5–15)
BUN: 19 mg/dL (ref 8–23)
CO2: 25 mmol/L (ref 22–32)
Calcium: 9.3 mg/dL (ref 8.9–10.3)
Chloride: 99 mmol/L (ref 98–111)
Creatinine, Ser: 0.72 mg/dL (ref 0.44–1.00)
GFR, Estimated: >60 mL/min (ref >60)
Glucose, Bld: 94 mg/dL (ref 70–99)
Potassium: 3.2 mmol/L — ABNORMAL LOW (ref 3.5–5.1)
Sodium: 135 mmol/L (ref 135–145)

## 2021-05-31 LAB — D-DIMER, QUANTITATIVE: D-Dimer, Quant: 0.27 ug/mL-FEU (ref 0.00–0.50)

## 2021-05-31 LAB — CBC WITH DIFFERENTIAL/PLATELET
Abs Immature Granulocytes: 0.04 10*3/uL (ref 0.00–0.07)
Basophils Absolute: 0 10*3/uL (ref 0.0–0.1)
Basophils Relative: 0 %
Eosinophils Absolute: 0 10*3/uL (ref 0.0–0.5)
Eosinophils Relative: 0 %
HCT: 40.1 % (ref 36.0–46.0)
Hemoglobin: 13.7 g/dL (ref 12.0–15.0)
Immature Granulocytes: 0 %
Lymphocytes Relative: 14 %
Lymphs Abs: 1.2 10*3/uL (ref 0.7–4.0)
MCH: 29.8 pg (ref 26.0–34.0)
MCHC: 34.2 g/dL (ref 30.0–36.0)
MCV: 87.2 fL (ref 80.0–100.0)
Monocytes Absolute: 0.6 10*3/uL (ref 0.1–1.0)
Monocytes Relative: 6 %
Neutro Abs: 7.2 10*3/uL (ref 1.7–7.7)
Neutrophils Relative %: 80 %
Platelets: 294 10*3/uL (ref 150–400)
RBC: 4.6 MIL/uL (ref 3.87–5.11)
RDW: 12.9 % (ref 11.5–15.5)
WBC: 9 10*3/uL (ref 4.0–10.5)
nRBC: 0 % (ref 0.0–0.2)

## 2021-05-31 LAB — TROPONIN I (HIGH SENSITIVITY)
Troponin I (High Sensitivity): 3 ng/L (ref <18)
Troponin I (High Sensitivity): 3 ng/L (ref <18)

## 2021-05-31 MED ORDER — ONDANSETRON 4 MG PO TBDP: ORAL_TABLET | ORAL | 0 refills | Status: DC

## 2021-05-31 MED ORDER — ALUM & MAG HYDROXIDE-SIMETH 200-200-20 MG/5ML PO SUSP
30.0000 mL | Freq: Once | ORAL | Status: AC
  Administered 2021-05-31: 30 mL via ORAL
  Filled 2021-05-31: qty 30

## 2021-05-31 MED ORDER — SODIUM CHLORIDE 0.9 % IV BOLUS
1000.0000 mL | Freq: Once | INTRAVENOUS | Status: AC
  Administered 2021-05-31: 1000 mL via INTRAVENOUS

## 2021-05-31 NOTE — ED Notes (Signed)
No chest pain, no back pain, abd feels full, no hx of MI, but does have a pacemaker in place. Non smoker, non diabetes, no blood clot hx as well. No recent hx, no CA hx or hormone replacement therapy

## 2021-05-31 NOTE — ED Triage Notes (Signed)
Pt sent from PCP. Pt having lightheadedness and diaphoresis for 2 days.

## 2021-05-31 NOTE — Discharge Instructions (Signed)
Try pepcid or tagamet up to twice a day.  Try to avoid things that may make this worse, most commonly these are spicy foods tomato based products fatty foods chocolate and peppermint.  Alcohol and tobacco can also make this worse.  Return to the emergency department for sudden worsening pain fever or inability to eat or drink.  

## 2021-05-31 NOTE — ED Provider Notes (Signed)
[facility] Provider Note   CSN: [id number] Arrival date & time: 05/31/21  1216     History {Add pertinent medical, surgical, social history, OB history to HPI:1} Chief Complaint  Patient presents with   Dizziness    Theresa Mejia is a 81 y.o. female.   Dizziness Associated symptoms: no chest pain, no headaches, no nausea, no palpitations, no shortness of breath and no vomiting       Home Medications Prior to Admission medications   Medication Sig Start Date End Date Taking? Authorizing Provider  calcium carbonate (TUMS - DOSED IN MG ELEMENTAL CALCIUM) 500 MG chewable tablet Chew 1 tablet by mouth 4 (four) times daily as needed for indigestion or heartburn.    [provider]  Cholecalciferol (VITAMIN D3) 50 MCG (2000 UT) TABS Take 2,000 Units by mouth daily.    [provider]  clonazePAM (KLONOPIN) 0.5 MG tablet TAKE 1/2 AT BEDTIME AS NEEDED FOR INSOMNIA, MAY INCREASE TO A WHOLE TABLET IF NEEDED 12/17/20   Copland, Gay Filler, MD  denosumab (PROLIA) 60 MG/ML SOSY injection Inject 60 mg into the skin every 6 (six) months.    [provider]  famotidine (PEPCID) 10 MG tablet Take 10 mg by mouth as needed for heartburn or indigestion.    [provider]  FLUoxetine (PROZAC) 20 MG capsule Take 1 capsule (20 mg total) by mouth daily. Increase to 40 mg after 2 weeks Patient taking differently: Take 20 mg by mouth 2 (two) times daily. 04/23/20   Copland, Gay Filler, MD  fluticasone (FLONASE) 50 MCG/ACT nasal spray SPRAY 2 SPRAYS INTO EACH NOSTRIL EVERY DAY 03/23/21   Copland, Gay Filler, MD  ipratropium (ATROVENT) 0.03 % nasal spray PLACE 2 SPRAYS INTO THE NOSE 4 (FOUR) TIMES DAILY. USE AS NEEDED FOR POST NASAL DRIP 01/15/20   Copland, Gay Filler, MD  levocetirizine (XYZAL) 5 MG tablet TAKE 1 TABLET BY MOUTH EVERY DAY IN THE EVENING 12/17/20   Copland, Gay Filler, MD  levothyroxine (SYNTHROID) 75 MCG tablet TAKE 1 TABLET BY MOUTH  EVERY DAY 05/10/21   Shamleffer, Melanie Crazier, MD  metoprolol succinate (TOPROL XL) 25 MG 24 hr tablet Take 0.5 tablets (12.5 mg total) by mouth daily. 09/08/20   Evans Lance, MD  polyethylene glycol (MIRALAX / GLYCOLAX) 17 g packet Take 17 g by mouth daily as needed.    [provider]  Polyvinyl Alcohol-Povidone (REFRESH OP) Place 1 drop into both eyes daily as needed (dry eyes).    [provider]  simvastatin (ZOCOR) 20 MG tablet TAKE 1 TABLET BY MOUTH EVERYDAY AT BEDTIME 05/13/21   Copland, Gay Filler, MD  Wheat Dextrin (BENEFIBER) POWD Take 1-2 Scoops by mouth every morning. (Take 1 to 2 teaspoons each morning mixed with beverage of choice)    [provider]      Allergies    Ramipril, Sulfa antibiotics, and Sulfonamide derivatives    Review of Systems   Review of Systems  Constitutional:  Negative for chills and fever.  HENT:  Negative for congestion and rhinorrhea.   Eyes:  Negative for redness and visual disturbance.  Respiratory:  Negative for shortness of breath and wheezing.   Cardiovascular:  Negative for chest pain and palpitations.  Gastrointestinal:  Negative for nausea and vomiting.  Genitourinary:  Negative for dysuria and urgency.  Musculoskeletal:  Negative for arthralgias and myalgias.  Skin:  Negative for pallor and wound.  Neurological:  Positive for dizziness. Negative for  headaches.   Physical Exam Updated Vital Signs Ht '4\' 11"'$  (1.499 m)   Wt 67.1 kg   BMI 29.89 kg/m  Physical Exam Vitals and nursing note reviewed.  Constitutional:      General: She is not in acute distress.    Appearance: She is well-developed. She is not diaphoretic.  HENT:     Head: Normocephalic and atraumatic.  Eyes:     Pupils: Pupils are equal, round, and reactive to light.  Cardiovascular:     Rate and Rhythm: Normal rate and regular rhythm.     Heart sounds: No murmur heard.   No friction rub. No gallop.  Pulmonary:     Effort: Pulmonary effort  is normal.     Breath sounds: No wheezing or rales.  Abdominal:     General: There is no distension.     Palpations: Abdomen is soft.     Tenderness: There is no abdominal tenderness.  Musculoskeletal:        General: No tenderness.     Cervical back: Normal range of motion and neck supple.  Skin:    General: Skin is warm and dry.  Neurological:     Mental Status: She is alert and oriented to person, place, and time.  Psychiatric:        Behavior: Behavior normal.    ED Results / Procedures / Treatments   Labs (all labs ordered are listed, but only abnormal results are displayed) Labs Reviewed  CBC WITH DIFFERENTIAL/PLATELET  BASIC METABOLIC PANEL  HEPATIC FUNCTION PANEL  TROPONIN I (HIGH SENSITIVITY)    EKG None  Radiology No results found.  Procedures Procedures  {Document cardiac monitor, telemetry assessment procedure when appropriate:1}  Medications Ordered in ED Medications  sodium chloride 0.9 % bolus 1,000 mL (has no administration in time range)  alum & mag hydroxide-simeth (MAALOX/MYLANTA) 200-200-20 MG/5ML suspension 30 mL (has no administration in time range)    ED Course/ Medical Decision Making/ A&P                           Medical Decision Making Amount and/or Complexity of Data Reviewed Labs: ordered. Radiology: ordered. ECG/medicine tests: ordered.  Risk OTC drugs.   81 yo F with a chief complaints of episodes of diaphoresis.  The patient states that on Saturday or 2 days ago she started to feel unwell and she ended up having diarrhea.  Has had an episode of vomiting and has had some loose stools off and on.  She feels like her abdomen is bloated denies any pain or pressure to her chest.  She saw her family doctor and discussed this with her and was sent down here for evaluation.  She could be having a presyncopal feeling and has not really been eating or drinking so we will give a bolus of IV fluids.  Laboratory evaluation.  Delta troponin.   Chest x-ray D-dimer.  {Document critical care time when appropriate:1} {Document review of labs and clinical decision tools ie heart score, Chads2Vasc2 etc:1}  {Document your independent review of radiology images, and any outside records:1} {Document your discussion with family members, caretakers, and with consultants:1} {Document social determinants of health affecting pt's care:1} {Document your decision making why or why not admission, treatments were needed:1} Final Clinical Impression(s) / ED Diagnoses Final diagnoses:  None    Rx / DC Orders ED Discharge Orders     None

## 2021-05-31 NOTE — Progress Notes (Signed)
[facility] [address], [place], [place] [address] [phone]  Date:  05/31/2021   Name:  Theresa Mejia   DOB:  [birth year]   MRN:  [id number]  PCP:  Darreld Mclean, MD    Chief Complaint: Dizziness (Nausea, elevated BP- she is under a lot of stress d/t a move. 138/90 84HR 148/92, 90 HR- during episode yesterday 133/90 84: this morning feeling well. /)   History of Present Illness:  Theresa Mejia is a 81 y.o. very pleasant female patient who presents with the following:  Pt called in earlier today with some unusual sx- we had her some in  She has a pacemaker- several years ago due to sinus node dysfunction Last seen by Dr Lovena Le last August   Today is Monday- sx started after she went out to eat on Friday She is not sure if she ate something bad- she was bloated and felt not 100% Diarrhea started the next day- she also vomited once on Saturday She did take a supplement her kids gave her and then vomited after this- not sure if related  She also has had several episodes of feeling lightheaded, weak, sweaty, heart racing No CP or SOB noted These episodes may last up to about 15 minutes- twice so far today  This is not associated with diarrhea or any further vomiting  Yesterday during one of these episodes her BP was 138/90, 148/90. Pulse about 90 This am her BP was 133/90 after an episode She feels tired, not quite herself right now   Patient Active Problem List   Diagnosis Date Noted   Pacemaker 08/25/2020   Osteopenia 01/09/2019   Pansinusitis 03/06/2018   Cough 03/06/2018   Orthostatic hypotension 12/06/2016   Sinus node dysfunction ([place]) 11/11/2015   Routine general medical examination at a health care facility 06/04/2015   Insomnia with sleep apnea 06/04/2015   Bronchitis, chronic obstructive w acute bronchitis ([place]) 12/10/2014   Lumbar disc disease 09/01/2010   Obstructive sleep apnea 05/31/2007    Hyperlipidemia with target LDL less than 130 02/05/2007   Depression with anxiety 02/05/2007   Allergic rhinitis 02/05/2007   Hypothyroidism 02/02/2007   Essential hypertension 02/02/2007    Past Medical History:  Diagnosis Date   Allergic rhinitis    Depression    DJD (degenerative joint disease)    GERD (gastroesophageal reflux disease)    Headache(784.0)    sinus   Hypercholesteremia    Hypersomnia    Hypertension    Hypothyroidism    Internal hemorrhoid 11/2008   s/p banding ([place])   Lumbar disc disease    OBSTRUCTIVE SLEEP APNEA    NPSG 04/29/07- AHI 1.1/hr, RDI 21.2/hr. Weight 176 lbs. CPAP was tried based on the RDI.     OVERACTIVE BLADDER    pt unaware   Presence of permanent cardiac pacemaker 11/11/2015   Reactive depression (situational)    Sinus node dysfunction ([facility])    Spondylosis, cervical     Past Surgical History:  Procedure Laterality Date   BIOPSY BREAST     BREAST EXCISIONAL BIOPSY Left 2011   CATARACT EXTRACTION W/ INTRAOCULAR LENS IMPLANT Right    COLONOSCOPY     EP IMPLANTABLE DEVICE N/A 11/11/2015   Procedure: Pacemaker Implant;  Surgeon: Evans Lance, MD;  Location: [facility];  Service: Cardiovascular;  Laterality: N/A;   eye lid lift     INSERT / REPLACE /  REMOVE PACEMAKER  11/11/2015   KNEE ARTHROSCOPY     Dr. [name]   MOUTH SURGERY  08/25/2017   implant fell out due to bone loss   REVERSE SHOULDER ARTHROPLASTY Right 08/08/2019   Procedure: REVERSE SHOULDER ARTHROPLASTY;  Surgeon: Tania Ade, MD;  Location: [facility];  Service: Orthopedics;  Laterality: Right;   rotator cuff surgery Right    Dr. [name]   TUBAL LIGATION      Social History   Tobacco Use   Smoking status: Former    Years: 8.00    Types: Cigarettes    Quit date: 01/10/1966    Years since quitting: 55.4   Smokeless tobacco: Never  Vaping Use   Vaping Use: Never used  Substance Use Topics   Alcohol use: No   Drug use: No    Family History  Problem  Relation Age of Onset   Crohn's disease Father 68   Macular degeneration Mother    Cancer Mother 42       bladder, never confirmed with biospy   Hypothyroidism Mother    Tremor Mother    Stroke Mother 23   Parkinsonism Maternal Grandmother    Alzheimer's disease Paternal Aunt    Alzheimer's disease Paternal Uncle    Diabetes Paternal Grandmother    Colon cancer Neg Hx    Esophageal cancer Neg Hx    Rectal cancer Neg Hx     Allergies  Allergen Reactions   Ramipril     Unknown reaction   Sulfa Antibiotics     Says it dropped her blood pressure really low.    Sulfonamide Derivatives     REACTION: hypotension    Medication list has been reviewed and updated.  Current Outpatient Medications on File Prior to Visit  Medication Sig Dispense Refill   calcium carbonate (TUMS - DOSED IN MG ELEMENTAL CALCIUM) 500 MG chewable tablet Chew 1 tablet by mouth 4 (four) times daily as needed for indigestion or heartburn.     Cholecalciferol (VITAMIN D3) 50 MCG (2000 UT) TABS Take 2,000 Units by mouth daily.     clonazePAM (KLONOPIN) 0.5 MG tablet TAKE 1/2 AT BEDTIME AS NEEDED FOR INSOMNIA, MAY INCREASE TO A WHOLE TABLET IF NEEDED 30 tablet 1   denosumab (PROLIA) 60 MG/ML SOSY injection Inject 60 mg into the skin every 6 (six) months.     famotidine (PEPCID) 10 MG tablet Take 10 mg by mouth as needed for heartburn or indigestion.     FLUoxetine (PROZAC) 20 MG capsule Take 1 capsule (20 mg total) by mouth daily. Increase to 40 mg after 2 weeks (Patient taking differently: Take 20 mg by mouth 2 (two) times daily.) 180 capsule 3   fluticasone (FLONASE) 50 MCG/ACT nasal spray SPRAY 2 SPRAYS INTO EACH NOSTRIL EVERY DAY 48 mL 3   ipratropium (ATROVENT) 0.03 % nasal spray PLACE 2 SPRAYS INTO THE NOSE 4 (FOUR) TIMES DAILY. USE AS NEEDED FOR POST NASAL DRIP 30 mL 6   levocetirizine (XYZAL) 5 MG tablet TAKE 1 TABLET BY MOUTH EVERY DAY IN THE EVENING 90 tablet 1   levothyroxine (SYNTHROID) 75 MCG tablet  TAKE 1 TABLET BY MOUTH EVERY DAY 90 tablet 1   metoprolol succinate (TOPROL XL) 25 MG 24 hr tablet Take 0.5 tablets (12.5 mg total) by mouth daily. 45 tablet 3   polyethylene glycol (MIRALAX / GLYCOLAX) 17 g packet Take 17 g by mouth daily as needed.     Polyvinyl Alcohol-Povidone (REFRESH OP) Place 1 drop into  both eyes daily as needed (dry eyes).     simvastatin (ZOCOR) 20 MG tablet TAKE 1 TABLET BY MOUTH EVERYDAY AT BEDTIME 90 tablet 3   Wheat Dextrin (BENEFIBER) POWD Take 1-2 Scoops by mouth every morning. (Take 1 to 2 teaspoons each morning mixed with beverage of choice)     No current facility-administered medications on file prior to visit.    Review of Systems:  As per HPI- otherwise negative.   Physical Examination: Vitals:   05/31/21 1137  BP: 140/80  Pulse: 76  Resp: 18  Temp: 97.6 F (36.4 C)  SpO2: 97%   Vitals:   05/31/21 1137  Weight: 146 lb 3.2 oz (66.3 kg)   Body mass index is 28.55 kg/m. Ideal Body Weight:   GEN: no acute distress. HEENT: Atraumatic, Normocephalic.  Ears and Nose: No external deformity. CV: RRR, No M/G/R. No JVD. No thrill. No extra heart sounds. PULM: CTA B, no wheezes, crackles, rhonchi. No retractions. No resp. distress. No accessory muscle use. ABD: S, NT, ND, +BS. No rebound. No HSM. EXTR: No c/c/e PSYCH: Normally interactive. Conversant.  Overweight, looks well  EKG: possible old infarct.  Compared with EKG from 7/22 no major change noted   Assessment and Plan: Essential hypertension - Plan: EKG 12-Lead  Lightheadedness - Plan: EKG 12-Lead  Nausea - Plan: EKG 12-Lead Pt seen today with episodic nausea, lightheadedness and sweating.  May be GI but also consider angina.  Pt will go to ER now with her husband- no current CP Called and alerted EDP about her   Signed Lamar Blinks, MD

## 2021-05-31 NOTE — ED Notes (Signed)
ED Provider at bedside. 

## 2021-05-31 NOTE — Progress Notes (Signed)
Remote pacemaker transmission.   

## 2021-06-16 ENCOUNTER — Other Ambulatory Visit: Payer: Self-pay | Admitting: Family Medicine

## 2021-06-16 DIAGNOSIS — F418 Other specified anxiety disorders: Secondary | ICD-10-CM

## 2021-08-18 ENCOUNTER — Ambulatory Visit (INDEPENDENT_AMBULATORY_CARE_PROVIDER_SITE_OTHER): Payer: PPO

## 2021-08-18 DIAGNOSIS — I495 Sick sinus syndrome: Secondary | ICD-10-CM | POA: Diagnosis not present

## 2021-08-18 LAB — CUP PACEART REMOTE DEVICE CHECK
Date Time Interrogation Session: 20230809073855
Implantable Lead Implant Date: 20171101
Implantable Lead Implant Date: 20171101
Implantable Lead Location: 753859
Implantable Lead Location: 753860
Implantable Lead Model: 377
Implantable Lead Model: 377
Implantable Lead Serial Number: 49553678
Implantable Lead Serial Number: 49617393
Implantable Pulse Generator Implant Date: 20171101
Pulse Gen Model: 394969
Pulse Gen Serial Number: 68817609

## 2021-08-21 ENCOUNTER — Other Ambulatory Visit: Payer: Self-pay | Admitting: Internal Medicine

## 2021-08-21 ENCOUNTER — Other Ambulatory Visit: Payer: Self-pay | Admitting: Family Medicine

## 2021-08-21 DIAGNOSIS — J301 Allergic rhinitis due to pollen: Secondary | ICD-10-CM

## 2021-08-23 ENCOUNTER — Other Ambulatory Visit: Payer: Self-pay

## 2021-09-02 NOTE — Telephone Encounter (Signed)
FYI  Pt almost 90 days past due for Prolia inj  If you would like for pt to continue with Prolia therapy, please have clinical staff reach out to pt for scheduling and to explain to importance of receiving Prolia injections every 6 months as abrupt cessation of Prolia raises risk of osteoporotic fracture.    "Discontinuation of Dmab is associated with a 3- to 5-fold higher risk for vertebral, major osteoporotic, and hip fractures [38,39]."   [email or web address]

## 2021-09-08 NOTE — Telephone Encounter (Signed)
Mychart message sent.

## 2021-09-21 NOTE — Progress Notes (Signed)
Remote pacemaker transmission.   

## 2021-09-22 ENCOUNTER — Ambulatory Visit: Payer: PPO

## 2021-09-22 DIAGNOSIS — M810 Age-related osteoporosis without current pathological fracture: Secondary | ICD-10-CM

## 2021-09-22 DIAGNOSIS — M85829 Other specified disorders of bone density and structure, unspecified upper arm: Secondary | ICD-10-CM

## 2021-09-22 MED ORDER — DENOSUMAB 60 MG/ML ~~LOC~~ SOSY
60.0000 mg | PREFILLED_SYRINGE | Freq: Once | SUBCUTANEOUS | Status: AC
  Administered 2021-09-22: 60 mg via SUBCUTANEOUS

## 2021-09-22 NOTE — Progress Notes (Signed)
Theresa Mejia is a 81 y.o. female presents to the office today for Prolia injections, per physician's orders. Original order:   (med), was administered  (location) today. Patient tolerated injection. Patient due for follow up labs/provider appt:  Date due:  Patient next injection due:   Theresa Mejia

## 2021-09-23 ENCOUNTER — Telehealth (INDEPENDENT_AMBULATORY_CARE_PROVIDER_SITE_OTHER): Payer: PPO | Admitting: Family Medicine

## 2021-09-23 VITALS — Ht 59.0 in | Wt 145.0 lb

## 2021-09-23 DIAGNOSIS — U071 COVID-19: Secondary | ICD-10-CM

## 2021-09-23 MED ORDER — NIRMATRELVIR/RITONAVIR (PAXLOVID)TABLET: 3.0000 | ORAL_TABLET | Freq: Two times a day (BID) | ORAL | 0 refills | Status: AC

## 2021-09-23 NOTE — Progress Notes (Signed)
Virtual Visit via Video Note  I connected with Theresa Mejia  on 09/23/21 at  1:20 PM EDT by a video enabled telemedicine application and verified that I am speaking with the correct person using two identifiers.  Location patient: [place] Location provider:work or home office Persons participating in the virtual visit: patient, provider  I discussed the limitations and requested verbal permission for telemedicine visit. The patient expressed understanding and agreed to proceed.   HPI:  Acute telemedicine visit for Covid19: -Onset: today, tested positive for covid -Symptoms include:cough, nasal congestion -Denies:fever, CP, SOB, NVD -Has tried:musinex -Pertinent past medical history: see below, Egfr > 60 about 3 months ago -Pertinent medication allergies: Allergies  Allergen Reactions   Ramipril     Unknown reaction   Sulfa Antibiotics     Says it dropped her blood pressure really low.    Sulfonamide Derivatives     REACTION: hypotension  -COVID-19 vaccine status:  Immunization History  Administered Date(s) Administered   Influenza Split 10/11/2010, 10/04/2013   Influenza Whole 10/13/2009   Influenza, High Dose Seasonal PF 10/14/2016, 10/22/2017, 10/22/2018   Influenza-Unspecified 10/28/2015, 10/17/2019, 10/20/2020   PFIZER(Purple Top)SARS-COV-2 Vaccination 01/24/2019, 02/11/2019, 10/03/2019, 04/20/2020   Pfizer Covid-19 Vaccine Bivalent Booster 26yr & up 10/20/2020   Pneumococcal Conjugate-13 11/06/2014   Pneumococcal Polysaccharide-23 02/25/2008   Td 02/25/2008   Tdap 10/23/2018   Zoster Recombinat (Shingrix) 04/27/2017, 08/01/2017     ROS: See pertinent positives and negatives per HPI.  Past Medical History:  Diagnosis Date   Allergic rhinitis    Depression    DJD (degenerative joint disease)    GERD (gastroesophageal reflux disease)    Headache(784.0)    sinus   Hypercholesteremia    Hypersomnia    Hypertension    Hypothyroidism    Internal hemorrhoid 11/2008   s/p  banding ([place])   Lumbar disc disease    OBSTRUCTIVE SLEEP APNEA    NPSG 04/29/07- AHI 1.1/hr, RDI 21.2/hr. Weight 176 lbs. CPAP was tried based on the RDI.     OVERACTIVE BLADDER    pt unaware   Presence of permanent cardiac pacemaker 11/11/2015   Reactive depression (situational)    Sinus node dysfunction (HCC)    Spondylosis, cervical     Past Surgical History:  Procedure Laterality Date   BIOPSY BREAST     BREAST EXCISIONAL BIOPSY Left 2011   CATARACT EXTRACTION W/ INTRAOCULAR LENS IMPLANT Right    COLONOSCOPY     EP IMPLANTABLE DEVICE N/A 11/11/2015   Procedure: Pacemaker Implant;  Surgeon: GEvans Lance MD;  [facility];  Service: Cardiovascular;  Laterality: N/A;   eye lid lift     INSERT / REPLACE / REMOVE PACEMAKER  11/11/2015   KNEE ARTHROSCOPY     Dr. AMaureen Ralphs  MOUTH SURGERY  08/25/2017   implant fell out due to bone loss   REVERSE SHOULDER ARTHROPLASTY Right 08/08/2019   Procedure: REVERSE SHOULDER ARTHROPLASTY;  Surgeon: CTania Ade MD;  Location: [facility];  Service: Orthopedics;  Laterality: Right;   rotator cuff surgery Right    Dr. SDaylene Katayama  TUBAL LIGATION       Current Outpatient Medications:    calcium carbonate (TUMS - DOSED IN MG ELEMENTAL CALCIUM) 500 MG chewable tablet, Chew 1 tablet by mouth 4 (four) times daily as needed for indigestion or heartburn., Disp: , Rfl:    CLARITIN 10 MG tablet, Take 1 tablet by mouth daily., Disp: , Rfl:    clonazePAM (KLONOPIN) 0.5 MG  tablet, TAKE 1/2 AT BEDTIME AS NEEDED FOR INSOMNIA, MAY INCREASE TO A WHOLE TABLET IF NEEDED, Disp: 30 tablet, Rfl: 1   D-5000 125 MCG (5000 UT) TABS, SMARTSIG:1 By Mouth, Disp: , Rfl:    denosumab (PROLIA) 60 MG/ML SOSY injection, Inject 60 mg into the skin every 6 (six) months., Disp: , Rfl:    famotidine (PEPCID) 10 MG tablet, Take 10 mg by mouth as needed for heartburn or indigestion., Disp: , Rfl:    FLUoxetine (PROZAC) 40 MG capsule, Take by mouth., Disp: , Rfl:     fluticasone (FLONASE) 50 MCG/ACT nasal spray, SPRAY 2 SPRAYS INTO EACH NOSTRIL EVERY DAY, Disp: 48 mL, Rfl: 3   levocetirizine (XYZAL) 5 MG tablet, TAKE 1 TABLET BY MOUTH EVERY DAY IN THE EVENING, Disp: 90 tablet, Rfl: 1   levothyroxine (SYNTHROID) 125 MCG tablet, Take 1 tablet by mouth in the morning., Disp: , Rfl:    metoprolol succinate (TOPROL-XL) 25 MG 24 hr tablet, TAKE 1/2 TABLET BY MOUTH EVERY DAY, Disp: 45 tablet, Rfl: 3   nirmatrelvir/ritonavir EUA (PAXLOVID) 20 x 150 MG & 10 x 100MG TABS, Take 3 tablets by mouth 2 (two) times daily for 5 days. (Take nirmatrelvir 150 mg two tablets twice daily for 5 days and ritonavir 100 mg one tablet twice daily for 5 days) Patient GFR is > 60, Disp: 30 tablet, Rfl: 0   ondansetron (ZOFRAN-ODT) 4 MG disintegrating tablet, 72m ODT q4 hours prn nausea/vomit, Disp: 20 tablet, Rfl: 0   polyethylene glycol (MIRALAX / GLYCOLAX) 17 g packet, Take 17 g by mouth daily as needed., Disp: , Rfl:    Polyvinyl Alcohol-Povidone (REFRESH OP), Place 1 drop into both eyes daily as needed (dry eyes)., Disp: , Rfl:    simvastatin (ZOCOR) 20 MG tablet, TAKE 1 TABLET BY MOUTH EVERYDAY AT BEDTIME, Disp: 90 tablet, Rfl: 3   Wheat Dextrin (BENEFIBER) POWD, Take 1-2 Scoops by mouth every morning. (Take 1 to 2 teaspoons each morning mixed with beverage of choice), Disp: , Rfl:    fluticasone (CUTIVATE) 0.05 % cream, Apply 1 Application topically 2 (two) times daily. (Patient not taking: Reported on 09/23/2021), Disp: , Rfl:    ipratropium (ATROVENT) 0.03 % nasal spray, PLACE 2 SPRAYS INTO THE NOSE 4 (FOUR) TIMES DAILY. USE AS NEEDED FOR POST NASAL DRIP (Patient not taking: Reported on 09/23/2021), Disp: 30 mL, Rfl: 6   mupirocin ointment (BACTROBAN) 2 %, Apply 1 Application topically 4 (four) times daily. (Patient not taking: Reported on 09/23/2021), Disp: , Rfl:  She had several medications on her list that she is not taking - updated her list. She got her pill bottles and assured me  not taking norvasc, chlorthalidone or arbs.Also she reports she rarely take klonopin.  EXAM:  VITALS per patient if applicable:  GENERAL: alert, oriented, appears well and in no acute distress  HEENT: atraumatic, conjunttiva clear, no obvious abnormalities on inspection of external nose and ears  NECK: normal movements of the head and neck  LUNGS: on inspection no signs of respiratory distress, breathing rate appears normal, no obvious gross SOB, gasping or wheezing  CV: no obvious cyanosis  MS: moves all visible extremities without noticeable abnormality  PSYCH/NEURO: pleasant and cooperative, no obvious depression or anxiety, speech and thought processing grossly intact  ASSESSMENT AND PLAN:  Discussed the following assessment and plan:  COVID-19   Discussed treatment options, side effect and risk of drug interactions, ideal treatment window, potential complications, isolation and precautions for COVID-19.  Discussed possibility of rebound with or without antivirals. Checked for/reviewed last GFR - listed in HPI if available.  After lengthy discussion, the patient opted for treatment with Paxlovid due to being higher risk for complications of covid or severe disease and other factors. Discussed EUA status of this drug and the fact that there is preliminary limited knowledge of risks/interactions/side effects per EUA document vs possible benefits and precautions. This information was shared with patient during the visit and also was provided in patient instructions. Agrees to hold statin, supplements and klonopin.  Other symptomatic care measures summarized in patient instructions. Advised to seek prompt virtual visit or in person care if worsening, new symptoms arise, or if is not improving with treatment as expected per our conversation of expected course. Discussed options for follow up care. Did let this patient know that I do telemedicine on Tuesdays and Thursdays for [facility] and  those are the days I am logged into the system. Advised to schedule follow up visit with PCP, [facility] virtual visits or [facility] if any further questions or concerns to avoid delays in care.   I discussed the assessment and treatment plan with the patient. The patient was provided an opportunity to ask questions and all were answered. The patient agreed with the plan and demonstrated an understanding of the instructions.     Lucretia Kern, DO

## 2021-09-23 NOTE — Patient Instructions (Addendum)
HOME CARE TIPS:   -I sent the medication(s) we discussed to your pharmacy: Meds ordered this encounter  Medications   nirmatrelvir/ritonavir EUA (PAXLOVID) 20 x 150 MG & 10 x '100MG'$  TABS    Sig: Take 3 tablets by mouth 2 (two) times daily for 5 days. (Take nirmatrelvir 150 mg two tablets twice daily for 5 days and ritonavir 100 mg one tablet twice daily for 5 days) Patient GFR is > 60    Dispense:  30 tablet    Refill:  0     -I sent in the [facility] treatment or referral you requested per our discussion. Please see the information provided below and discuss further with the pharmacist/treatment team.  -If taking Paxlovid, please review all medications, supplement and over the counter drugs with your pharmacist and ask them to check for any interactions. Please make the following changes to your regular medications while taking Paxlovid: *STOP your simvastatin (Zocor) and restart 3 days after finishing the Paxlovid -stop any supplements while taking the Paxlovid -do not take klonopin with the Paxlovid  -there is a chance of rebound illness with covid after improving. This can happen whether or not you take an antiviral treatment. If you become sick again with covid after getting better, please schedule a follow up virtual visit and isolate again.  -nasal saline sinus rinses twice daily  -stay hydrated, drink plenty of fluids and eat small healthy meals - avoid dairy   -follow up with your doctor in 2-3 days unless improving and feeling better  -stay home while sick, except to seek medical care. If you have COVID19, you will likely be contagious for 7-10 days. Flu or Influenza is likely contagious for about 7 days. Other respiratory viral infections remain contagious for 5-10+ days depending on the virus and many other factors. Wear a good mask that fits snugly (such as N95 or KN95) if around others to reduce the risk of transmission.  It was nice to meet you today, and I really hope you  are feeling better soon. I help [facility] out with telemedicine visits on Tuesdays and Thursdays and am happy to help if you need a follow up virtual visit on those days. Otherwise, if you have any concerns or questions following this visit please schedule a follow up visit with your Primary Care doctor or seek care at a local urgent care clinic to avoid delays in care.    Seek in person care or schedule a follow up video visit promptly if your symptoms worsen, new concerns arise or you are not improving with treatment. Call 911 and/or seek emergency care if your symptoms are severe or life threatening.   See the following link for the most recent information regarding Paxlovid:  [email or web address].com   Nirmatrelvir; Ritonavir Tablets What is this medication? NIRMATRELVIR; RITONAVIR (NIR ma TREL vir; ri TOE na veer) treats mild to moderate COVID-19. It may help people who are at high risk of developing severe illness. This medication works by limiting the spread of the virus in your body. The FDA has allowed the emergency use of this medication. This medicine may be used for other purposes; ask your health care provider or pharmacist if you have questions. COMMON BRAND NAME(S): PAXLOVID What should I tell my care team before I take this medication? They need to know if you have any of these conditions: Any allergies Any serious illness Kidney disease Liver disease An unusual or allergic reaction to nirmatrelvir, ritonavir, other medications, foods, dyes, or  preservatives Pregnant or trying to get pregnant Breast-feeding How should I use this medication? This product contains 2 different medications that are packaged together. For the standard dose, take 2 pink tablets of nirmatrelvir with 1 white tablet of ritonavir (3 tablets total) by mouth with water twice daily. Talk to your care team if you have kidney disease. You may need a different dose. Swallow the tablets whole. You can take it with  or without food. If it upsets your stomach, take it with food. Take all of this medication unless your care team tells you to stop it early. Keep taking it even if you think you are better. Talk to your care team about the use of this medication in children. While it may be prescribed for children as young as 12 years for selected conditions, precautions do apply. Overdosage: If you think you have taken too much of this medicine contact a poison control center or emergency room at once. NOTE: This medicine is only for you. Do not share this medicine with others. What if I miss a dose? If you miss a dose, take it as soon as you can unless it is more than 8 hours late. If it is more than 8 hours late, skip the missed dose. Take the next dose at the normal time. Do not take extra or 2 doses at the same time to make up for the missed dose. What may interact with this medication? Do not take this medication with any of the following medications: Alfuzosin Certain medications for anxiety or sleep like midazolam, triazolam Certain medications for cancer like apalutamide, enzalutamide Certain medications for cholesterol like lovastatin, simvastatin Certain medications for irregular heart beat like amiodarone, dronedarone, flecainide, propafenone, quinidine Certain medications for pain like meperidine, piroxicam Certain medications for psychotic disorders like clozapine, lurasidone, pimozide Certain medications for seizures like carbamazepine, phenobarbital, phenytoin Colchicine Eletriptan Eplerenone Ergot alkaloids like dihydroergotamine, ergonovine, ergotamine, methylergonovine Finerenone Flibanserin Ivabradine Lomitapide Naloxegol Ranolazine Rifampin Sildenafil Silodosin St. John's Wort Tolvaptan Ubrogepant Voclosporin This medication may also interact with the following medications: Bedaquiline Birth control pills Bosentan Certain antibiotics like erythromycin or  clarithromycin Certain medications for blood pressure like amlodipine, diltiazem, felodipine, nicardipine, nifedipine Certain medications for cancer like abemaciclib, ceritinib, dasatinib, encorafenib, ibrutinib, ivosidenib, neratinib, nilotinib, venetoclax, vinblastine, vincristine Certain medications for cholesterol like atorvastatin, rosuvastatin Certain medications for depression like bupropion, trazodone Certain medications for fungal infections like isavuconazonium, itraconazole, ketoconazole, voriconazole Certain medications for hepatitis C like elbasvir; grazoprevir, dasabuvir; ombitasvir; paritaprevir; ritonavir, glecaprevir; pibrentasvir, sofosbuvir; velpatasvir; voxilaprevir Certain medications for HIV or AIDS Certain medications for irregular heartbeat like lidocaine Certain medications that treat or prevent blood clots like rivaroxaban, warfarin Digoxin Fentanyl Medications that lower your chance of fighting infection like cyclosporine, sirolimus, tacrolimus Methadone Quetiapine Rifabutin Salmeterol Steroid medications like betamethasone, budesonide, ciclesonide, dexamethasone, fluticasone, methylprednisone, mometasone, triamcinolone This list may not describe all possible interactions. Give your health care provider a list of all the medicines, herbs, non-prescription drugs, or dietary supplements you use. Also tell them if you smoke, drink alcohol, or use illegal drugs. Some items may interact with your medicine. What should I watch for while using this medication? Your condition will be monitored carefully while you are receiving this medication. Visit your care team for regular checkups. Tell your care team if your symptoms do not start to get better or if they get worse. If you have untreated HIV infection, this medication may lead to some HIV medications not working as well in the future.  Birth control may not work properly while you are taking this medication. Talk to your  care team about using an extra method of birth control. What side effects may I notice from receiving this medication? Side effects that you should report to your care team as soon as possible: Allergic reactions--skin rash, itching, hives, swelling of the face, lips, tongue, or throat Liver injury--right upper belly pain, loss of appetite, nausea, light-colored stool, dark yellow or brown urine, yellowing skin or eyes, unusual weakness or fatigue Redness, blistering, peeling, or loosening of the skin, including inside the mouth Side effects that usually do not require medical attention (report these to your care team if they continue or are bothersome): Change in taste Diarrhea General discomfort and fatigue Increase in blood pressure Muscle pain Nausea Stomach pain This list may not describe all possible side effects. Call your doctor for medical advice about side effects. You may report side effects to FDA at 1-800-FDA-1088. Where should I keep my medication? Keep out of the reach of children and pets. Store at room temperature between 20 and 25 degrees C (68 and 77 degrees F). Get rid of any unused medication after the expiration date. To get rid of medications that are no longer needed or have expired: Take the medication to a medication take-back program. Check with your pharmacy or law enforcement to find a location. If you cannot return the medication, check the label or package insert to see if the medication should be thrown out in the garbage or flushed down the toilet. If you are not sure, ask your care team. If it is safe to put it in the trash, take the medication out of the container. Mix the medication with cat litter, dirt, coffee grounds, or other unwanted substance. Seal the mixture in a bag or container. Put it in the trash. NOTE: This sheet is a summary. It may not cover all possible information. If you have questions about this medicine, talk to your doctor, pharmacist, or  health care provider.  2022 Elsevier/Gold Standard (2020-09-28 00:00:00)

## 2021-10-08 ENCOUNTER — Encounter: Payer: Self-pay | Admitting: Family Medicine

## 2021-10-08 NOTE — Progress Notes (Unsigned)
[facility] [address] [place], [place] [address] [phone]  Date:  10/11/2021   Name:  Theresa Mejia   DOB:  1940/03/07   MRN:  [id number]  PCP:  Darreld Mclean, MD    Chief Complaint: bursa pain (Pt says she had some pain in the [place]. Some years ago Dr Wynelle Link ordered pain shots- she has not had any issues until recently. /Concerns/ questions: 1. Pt is 2 weeks post covid- she is very fatigued, how long does this last? She feels like she is in a tunnel. )   History of Present Illness:  Theresa Mejia is a 81 y.o. very pleasant female patient who presents with the following:  Patient contacted me last week with concern about pain in her right hip bursa.  Asked her to come in to discuss this further Most recently seen by myself in May History of SA node dysfunction with pacemaker, insomnia, hyperlipidemia, sleep apnea, hypertension, hypothyroidism  She was sick with covid 2 weeks ago- she noted "head cold" type sx but tested to be on the safe side and she did test positive She took paxlovid per Dr Colin Benton  She and her husband both had covid  She notes she is still feeing quite tired - however this is not a new issue, she has felt this way since she was 17, worse right now since her illness Otherwise her sx are mostly better   Pt notes that perhaps 20 years ago she saw ortho with right GT bursitis - she did some shots and got better She notes the same sort of pain again for about 2 weeks No falls or other injury that she can recall  We discussed potentially doing a greater trochanteric bursitis injection today.  She if advised about possible infection or bleeding risk, she would like to proceed Patient Active Problem List   Diagnosis Date Noted   Lumbar radiculopathy 12/06/2020   Pain of left hip joint 11/11/2020   Pacemaker 08/25/2020   Osteoarthritis of carpometacarpal (CMC) joint of thumb 01/16/2019    Pain in right hand 01/16/2019   Trigger finger of right hand 01/16/2019   Osteopenia 01/09/2019   Pansinusitis 03/06/2018   Orthostatic hypotension 12/06/2016   Sinus node dysfunction ([place]) 11/11/2015   Routine general medical examination at a health care facility 06/04/2015   Insomnia with sleep apnea 06/04/2015   Bronchitis, chronic obstructive w acute bronchitis ([place]) 12/10/2014   Lumbar disc disease 09/01/2010   Obstructive sleep apnea 05/31/2007   Hyperlipidemia with target LDL less than 130 02/05/2007   Depression with anxiety 02/05/2007   Allergic rhinitis 02/05/2007   Hypothyroidism 02/02/2007   Essential hypertension 02/02/2007    Past Medical History:  Diagnosis Date   Allergic rhinitis    Depression    DJD (degenerative joint disease)    GERD (gastroesophageal reflux disease)    Headache(784.0)    sinus   Hypercholesteremia    Hypersomnia    Hypertension    Hypothyroidism    Internal hemorrhoid 11/2008   s/p banding ([place])   Lumbar disc disease    OBSTRUCTIVE SLEEP APNEA    NPSG 04/29/07- AHI 1.1/hr, RDI 21.2/hr. Weight 176 lbs. CPAP was tried based on the RDI.     OVERACTIVE BLADDER    pt unaware   Presence of permanent cardiac pacemaker 11/11/2015   Reactive depression (situational)    Sinus node dysfunction (HCC)  Spondylosis, cervical     Past Surgical History:  Procedure Laterality Date   BIOPSY BREAST     BREAST EXCISIONAL BIOPSY Left 2011   CATARACT EXTRACTION W/ INTRAOCULAR LENS IMPLANT Right    COLONOSCOPY     EP IMPLANTABLE DEVICE N/A 11/11/2015   Procedure: Pacemaker Implant;  Surgeon: Evans Lance, MD;  Location: [facility];  Service: Cardiovascular;  Laterality: N/A;   eye lid lift     INSERT / REPLACE / REMOVE PACEMAKER  11/11/2015   KNEE ARTHROSCOPY     Dr. [name]   MOUTH SURGERY  08/25/2017   implant fell out due to bone loss   REVERSE SHOULDER ARTHROPLASTY Right 08/08/2019   Procedure: REVERSE SHOULDER ARTHROPLASTY;   Surgeon: Tania Ade, MD;  Location: [facility];  Service: Orthopedics;  Laterality: Right;   rotator cuff surgery Right    Dr. [name]   TUBAL LIGATION      Social History   Tobacco Use   Smoking status: Former    Years: 8.00    Types: Cigarettes    Quit date: 01/10/1966    Years since quitting: 55.7   Smokeless tobacco: Never  Vaping Use   Vaping Use: Never used  Substance Use Topics   Alcohol use: No   Drug use: No    Family History  Problem Relation Age of Onset   Crohn's disease Father 25   Macular degeneration Mother    Cancer Mother 11       bladder, never confirmed with biospy   Hypothyroidism Mother    Tremor Mother    Stroke Mother 1   Parkinsonism Maternal Grandmother    Alzheimer's disease Paternal Aunt    Alzheimer's disease Paternal Uncle    Diabetes Paternal Grandmother    Colon cancer Neg Hx    Esophageal cancer Neg Hx    Rectal cancer Neg Hx     Allergies  Allergen Reactions   Ramipril     Unknown reaction   Sulfa Antibiotics     Says it dropped her blood pressure really low.    Sulfonamide Derivatives     REACTION: hypotension    Medication list has been reviewed and updated.  Current Outpatient Medications on File Prior to Visit  Medication Sig Dispense Refill   calcium carbonate (TUMS - DOSED IN MG ELEMENTAL CALCIUM) 500 MG chewable tablet Chew 1 tablet by mouth 4 (four) times daily as needed for indigestion or heartburn.     clonazePAM (KLONOPIN) 0.5 MG tablet TAKE 1/2 AT BEDTIME AS NEEDED FOR INSOMNIA, MAY INCREASE TO A WHOLE TABLET IF NEEDED 30 tablet 1   D-5000 125 MCG (5000 UT) TABS SMARTSIG:1 By Mouth     denosumab (PROLIA) 60 MG/ML SOSY injection Inject 60 mg into the skin every 6 (six) months.     famotidine (PEPCID) 10 MG tablet Take 10 mg by mouth as needed for heartburn or indigestion.     FLUoxetine (PROZAC) 40 MG capsule Take by mouth.     fluticasone (FLONASE) 50 MCG/ACT nasal spray SPRAY 2 SPRAYS INTO EACH NOSTRIL EVERY DAY  48 mL 3   levocetirizine (XYZAL) 5 MG tablet TAKE 1 TABLET BY MOUTH EVERY DAY IN THE EVENING 90 tablet 1   levothyroxine (SYNTHROID) 125 MCG tablet Take 1 tablet by mouth in the morning.     metoprolol succinate (TOPROL-XL) 25 MG 24 hr tablet TAKE 1/2 TABLET BY MOUTH EVERY DAY 45 tablet 3   polyethylene glycol (MIRALAX / GLYCOLAX) 17  g packet Take 17 g by mouth daily as needed.     Polyvinyl Alcohol-Povidone (REFRESH OP) Place 1 drop into both eyes daily as needed (dry eyes).     simvastatin (ZOCOR) 20 MG tablet TAKE 1 TABLET BY MOUTH EVERYDAY AT BEDTIME 90 tablet 3   Wheat Dextrin (BENEFIBER) POWD Take 1-2 Scoops by mouth every morning. (Take 1 to 2 teaspoons each morning mixed with beverage of choice)     No current facility-administered medications on file prior to visit.    Review of Systems:  As per HPI- otherwise negative.   Physical Examination: Vitals:   10/11/21 1102  BP: 122/80  Pulse: 76  Resp: 18  Temp: 97.8 F (36.6 C)  SpO2: 99%   Vitals:   10/11/21 1102  Weight: 146 lb 3.2 oz (66.3 kg)  Height: '5\' 1"'$  (1.549 m)   Body mass index is 27.62 kg/m. Ideal Body Weight: Weight in (lb) to have BMI = 25: 132  GEN: no acute distress.  Mild overweight, looks well HEENT: Atraumatic, Normocephalic.  Ears and Nose: No external deformity. CV: RRR, No M/G/R. No JVD. No thrill. No extra heart sounds. PULM: CTA B, no wheezes, crackles, rhonchi. No retractions. No resp. distress. No accessory muscle use. EXTR: No c/c/e PSYCH: Normally interactive. Conversant.  Patient is tender over the right greater trochanteric bursa.  No sign of infection or inflammation at the skin.  Normal range of motion of the right hip otherwise  VC obtained Right hip prepped with betadine and alcohol Injected 15m of 1% lido plain and 40 mg depomedrol into greater trochanteric bursa Patient tolerated well with no immediate complication, she noted improvement of her pain.  No blood loss Assessment  and Plan: Trochanteric bursitis of right hip - Plan: methylPREDNISolone acetate (DEPO-MEDROL) injection 40 mg  Right hip pain - Plan: DG HIP UNILAT W OR W/O PELVIS 2-3 VIEWS RIGHT  Patient seen today with apparent greater trochanteric bursitis of the right hip.  She like to proceed with injection of steroid and lidocaine today.  She noted immediate improvement of her symptoms will report any evidence of complication or infection She is also interested in getting an x-ray to determine any degree of arthritis.  This is ordered, she will do in the next couple of days  Signed JLamar Blinks MD

## 2021-10-11 ENCOUNTER — Ambulatory Visit (INDEPENDENT_AMBULATORY_CARE_PROVIDER_SITE_OTHER): Payer: PPO | Admitting: Family Medicine

## 2021-10-11 VITALS — BP 122/80 | HR 76 | Temp 97.8°F | Resp 18 | Ht 61.0 in | Wt 146.2 lb

## 2021-10-11 DIAGNOSIS — M7061 Trochanteric bursitis, right hip: Secondary | ICD-10-CM | POA: Diagnosis not present

## 2021-10-11 DIAGNOSIS — M25551 Pain in right hip: Secondary | ICD-10-CM | POA: Diagnosis not present

## 2021-10-11 MED ORDER — METHYLPREDNISOLONE ACETATE 40 MG/ML IJ SUSP
40.0000 mg | Freq: Once | INTRAMUSCULAR | Status: AC
  Administered 2021-10-11: 40 mg via INTRAMUSCULAR

## 2021-10-11 NOTE — Patient Instructions (Signed)
Good to see you today!  I hope the hip injection helps you- let me know If any sign of infection or other concern please call me  I will be in touch with your x-ray reports asap

## 2021-10-14 ENCOUNTER — Ambulatory Visit
Admission: RE | Admit: 2021-10-14 | Discharge: 2021-10-14 | Disposition: A | Discharge status: Home / Self Care | Payer: PPO | Source: Ambulatory Visit | Attending: Family Medicine | Admitting: Family Medicine

## 2021-10-14 ENCOUNTER — Encounter: Payer: Self-pay | Admitting: Family Medicine

## 2021-10-14 DIAGNOSIS — M25551 Pain in right hip: Secondary | ICD-10-CM | POA: Diagnosis not present

## 2021-10-19 ENCOUNTER — Ambulatory Visit (INDEPENDENT_AMBULATORY_CARE_PROVIDER_SITE_OTHER): Payer: PPO | Admitting: Family Medicine

## 2021-10-19 ENCOUNTER — Encounter: Payer: Self-pay | Admitting: Family Medicine

## 2021-10-19 VITALS — BP 140/81 | HR 80 | Temp 98.1°F | Ht 60.0 in | Wt 146.1 lb

## 2021-10-19 DIAGNOSIS — R61 Generalized hyperhidrosis: Secondary | ICD-10-CM | POA: Diagnosis not present

## 2021-10-19 DIAGNOSIS — K219 Gastro-esophageal reflux disease without esophagitis: Secondary | ICD-10-CM

## 2021-10-19 MED ORDER — PANTOPRAZOLE SODIUM 40 MG PO TBEC: 40.0000 mg | DELAYED_RELEASE_TABLET | Freq: Every day | ORAL | 1 refills | Status: DC

## 2021-10-19 NOTE — Progress Notes (Signed)
Chief Complaint  Patient presents with   Tingling    All over, gets weak and sweating all over. Gets light headed.    Subjective: Patient is a 81 y.o. female here for sweating.  She is here with her husband.  2 weeks got over covid. Has been having tingling in her UE's intermittently but not since yesterday. She was nauseated the other day, no V/D/C, no coughing, wheezing, SOB, fevers, ST, urinary complaints, rashes, new pain. Eating seemed to trigger the sweating. Has been taking Pepcid and Tums for reflux, does have a hx.   Past Medical History:  Diagnosis Date   Allergic rhinitis    Depression    DJD (degenerative joint disease)    GERD (gastroesophageal reflux disease)    Headache(784.0)    sinus   Hypercholesteremia    Hypersomnia    Hypertension    Hypothyroidism    Internal hemorrhoid 11/2008   s/p banding ([place])   Lumbar disc disease    OBSTRUCTIVE SLEEP APNEA    NPSG 04/29/07- AHI 1.1/hr, RDI 21.2/hr. Weight 176 lbs. CPAP was tried based on the RDI.     OVERACTIVE BLADDER    pt unaware   Presence of permanent cardiac pacemaker 11/11/2015   Reactive depression (situational)    Sinus node dysfunction (HCC)    Spondylosis, cervical     Objective: BP (!) 140/81 (BP Location: Left Arm, Patient Position: Sitting, Cuff Size: Normal)   Pulse 80   Temp 98.1 F (36.7 C) (Oral)   Ht 5' (1.524 m)   Wt 146 lb 2 oz (66.3 kg)   SpO2 97%   BMI 28.54 kg/m  General: Awake, appears stated age HEENT: Canals are patent without otorrhea, TMs unremarkable, nares are patent without rhinorrhea, MMM, no pharyngeal exudate or erythema Heart: RRR Lungs: CTAB, no rales, wheezes or rhonchi. No accessory muscle use Abdomen: Bowel sounds present, soft, nondistended, mild TTP in the epigastric region MSK: No TTP over the cervical paraspinal or suboccipital triangle musculature Neuro: 5/5 strength throughout the upper extremities bilaterally, no asymmetry, grip strength is adequate,  negative Spurling's, DTRs equal and symmetric in lower extremities throughout without clonus, no cerebellar signs Psych: Age appropriate judgment and insight, normal affect and mood  Assessment and Plan: Gastroesophageal reflux disease, unspecified whether esophagitis present - Plan: pantoprazole (PROTONIX) 40 MG tablet  Diaphoresis  Based off of her history and exam, I think this is more related to reflux.  This chronic issue is not currently controlled.  Start Protonix 40 mg daily for 2 months and then see if she can, food.  Okay to use Tums/Pepcid as needed.  Reflux precautions discussed.  Exam is reassuring for neurologic issues.  It does not seem like she has an infection.  She will follow-up/let me know if anything new arises or changes. The patient and her spouse voiced understanding and agreement to the plan.  [place], DO 10/19/21  2:22 PM

## 2021-10-19 NOTE — Patient Instructions (Signed)
The only lifestyle changes that have data behind them are weight loss for the overweight/obese and elevating the head of the bed. Finding out which foods/positions are triggers is important.  If anything changes please let me know.   Let us know if you need anything.

## 2021-11-13 NOTE — Progress Notes (Unsigned)
[place] at [facility] [address], [place], [place] [address] [phone]  Date:  11/15/2021   Name:  Theresa Mejia   DOB:  1940/06/14   MRN:  [id number]  PCP:  Darreld Mclean, MD    Chief Complaint: No chief complaint on file.   History of Present Illness:  Theresa Mejia is a 81 y.o. very pleasant female patient who presents with the following:  Pt seen today with concern of lower back pain Last seen by myself about a month ago with concern of right hip pain   History of SA node dysfunction with pacemaker, insomnia, hyperlipidemia, sleep apnea, hypertension, hypothyroidism  I gave her a GT bursa infection     Patient Active Problem List   Diagnosis Date Noted   Lumbar radiculopathy 12/06/2020   Pain of left hip joint 11/11/2020   Pacemaker 08/25/2020   Osteoarthritis of carpometacarpal ([place]) joint of thumb 01/16/2019   Pain in right hand 01/16/2019   Trigger finger of right hand 01/16/2019   Osteopenia 01/09/2019   Pansinusitis 03/06/2018   Orthostatic hypotension 12/06/2016   Sinus node dysfunction ([place]) 11/11/2015   Routine general medical examination at a health care facility 06/04/2015   Insomnia with sleep apnea 06/04/2015   Bronchitis, chronic obstructive w acute bronchitis ([place]) 12/10/2014   Lumbar disc disease 09/01/2010   Obstructive sleep apnea 05/31/2007   Hyperlipidemia with target LDL less than 130 02/05/2007   Depression with anxiety 02/05/2007   Allergic rhinitis 02/05/2007   Hypothyroidism 02/02/2007   Essential hypertension 02/02/2007    Past Medical History:  Diagnosis Date   Allergic rhinitis    Depression    DJD (degenerative joint disease)    GERD (gastroesophageal reflux disease)    Headache(784.0)    sinus   Hypercholesteremia    Hypersomnia    Hypertension    Hypothyroidism    Internal hemorrhoid 11/2008   s/p banding ([place])   Lumbar disc disease    OBSTRUCTIVE SLEEP  APNEA    NPSG 04/29/07- AHI 1.1/hr, RDI 21.2/hr. Weight 176 lbs. CPAP was tried based on the RDI.     OVERACTIVE BLADDER    pt unaware   Presence of permanent cardiac pacemaker 11/11/2015   Reactive depression (situational)    Sinus node dysfunction (HCC)    Spondylosis, cervical     Past Surgical History:  Procedure Laterality Date   BIOPSY BREAST     BREAST EXCISIONAL BIOPSY Left 2011   CATARACT EXTRACTION W/ INTRAOCULAR LENS IMPLANT Right    COLONOSCOPY     EP IMPLANTABLE DEVICE N/A 11/11/2015   Procedure: Pacemaker Implant;  Surgeon: Evans Lance, MD;  Location: [facility];  Service: Cardiovascular;  Laterality: N/A;   eye lid lift     INSERT / REPLACE / REMOVE PACEMAKER  11/11/2015   KNEE ARTHROSCOPY     Dr. [name]   MOUTH SURGERY  08/25/2017   implant fell out due to bone loss   REVERSE SHOULDER ARTHROPLASTY Right 08/08/2019   Procedure: REVERSE SHOULDER ARTHROPLASTY;  Surgeon: Tania Ade, MD;  Location: [facility];  Service: Orthopedics;  Laterality: Right;   rotator cuff surgery Right    Dr. [name]   TUBAL LIGATION      Social History   Tobacco Use   Smoking status: Former    Years: 8.00    Types: Cigarettes    Quit date: 01/10/1966    Years since quitting:  55.8   Smokeless tobacco: Never  Vaping Use   Vaping Use: Never used  Substance Use Topics   Alcohol use: No   Drug use: No    Family History  Problem Relation Age of Onset   Crohn's disease Father 60   Macular degeneration Mother    Cancer Mother 36       bladder, never confirmed with biospy   Hypothyroidism Mother    Tremor Mother    Stroke Mother 19   Parkinsonism Maternal Grandmother    Alzheimer's disease Paternal Aunt    Alzheimer's disease Paternal Uncle    Diabetes Paternal Grandmother    Colon cancer Neg Hx    Esophageal cancer Neg Hx    Rectal cancer Neg Hx     Allergies  Allergen Reactions   Ramipril     Unknown reaction   Sulfa Antibiotics     Says it dropped her  blood pressure really low.    Sulfonamide Derivatives     REACTION: hypotension    Medication list has been reviewed and updated.  Current Outpatient Medications on File Prior to Visit  Medication Sig Dispense Refill   calcium carbonate (TUMS - DOSED IN MG ELEMENTAL CALCIUM) 500 MG chewable tablet Chew 1 tablet by mouth 4 (four) times daily as needed for indigestion or heartburn.     clonazePAM (KLONOPIN) 0.5 MG tablet TAKE 1/2 AT BEDTIME AS NEEDED FOR INSOMNIA, MAY INCREASE TO A WHOLE TABLET IF NEEDED 30 tablet 1   D-5000 125 MCG (5000 UT) TABS SMARTSIG:1 By Mouth     denosumab (PROLIA) 60 MG/ML SOSY injection Inject 60 mg into the skin every 6 (six) months.     famotidine (PEPCID) 10 MG tablet Take 10 mg by mouth as needed for heartburn or indigestion.     FLUoxetine (PROZAC) 40 MG capsule Take by mouth.     fluticasone (FLONASE) 50 MCG/ACT nasal spray SPRAY 2 SPRAYS INTO EACH NOSTRIL EVERY DAY 48 mL 3   levocetirizine (XYZAL) 5 MG tablet TAKE 1 TABLET BY MOUTH EVERY DAY IN THE EVENING 90 tablet 1   levothyroxine (SYNTHROID) 125 MCG tablet Take 1 tablet by mouth in the morning.     metoprolol succinate (TOPROL-XL) 25 MG 24 hr tablet TAKE 1/2 TABLET BY MOUTH EVERY DAY 45 tablet 3   pantoprazole (PROTONIX) 40 MG tablet Take 1 tablet (40 mg total) by mouth daily. Take 30 minutes before eating. 30 tablet 1   polyethylene glycol (MIRALAX / GLYCOLAX) 17 g packet Take 17 g by mouth daily as needed.     Polyvinyl Alcohol-Povidone (REFRESH OP) Place 1 drop into both eyes daily as needed (dry eyes).     simvastatin (ZOCOR) 20 MG tablet TAKE 1 TABLET BY MOUTH EVERYDAY AT BEDTIME 90 tablet 3   Wheat Dextrin (BENEFIBER) POWD Take 1-2 Scoops by mouth every morning. (Take 1 to 2 teaspoons each morning mixed with beverage of choice)     No current facility-administered medications on file prior to visit.    Review of Systems:  As per HPI- otherwise negative.   Physical Examination: There were no  vitals filed for this visit. There were no vitals filed for this visit. There is no height or weight on file to calculate BMI. Ideal Body Weight:    GEN: no acute distress. HEENT: Atraumatic, Normocephalic.  Ears and Nose: No external deformity. CV: RRR, No M/G/R. No JVD. No thrill. No extra heart sounds. PULM: CTA B, no wheezes, crackles, rhonchi. No  retractions. No resp. distress. No accessory muscle use. ABD: S, NT, ND, +BS. No rebound. No HSM. EXTR: No c/c/e PSYCH: Normally interactive. Conversant.    Assessment and Plan: ***  Signed Lamar Blinks, MD

## 2021-11-15 ENCOUNTER — Encounter: Payer: Self-pay | Admitting: Family Medicine

## 2021-11-15 ENCOUNTER — Ambulatory Visit (INDEPENDENT_AMBULATORY_CARE_PROVIDER_SITE_OTHER): Payer: PPO | Admitting: Family Medicine

## 2021-11-15 VITALS — BP 124/70 | HR 76 | Temp 97.8°F | Resp 18 | Ht 60.0 in | Wt 154.0 lb

## 2021-11-15 DIAGNOSIS — F329 Major depressive disorder, single episode, unspecified: Secondary | ICD-10-CM

## 2021-11-15 DIAGNOSIS — Z131 Encounter for screening for diabetes mellitus: Secondary | ICD-10-CM | POA: Diagnosis not present

## 2021-11-15 DIAGNOSIS — R5382 Chronic fatigue, unspecified: Secondary | ICD-10-CM

## 2021-11-15 DIAGNOSIS — M545 Low back pain, unspecified: Secondary | ICD-10-CM

## 2021-11-15 DIAGNOSIS — E038 Other specified hypothyroidism: Secondary | ICD-10-CM

## 2021-11-15 DIAGNOSIS — M25552 Pain in left hip: Secondary | ICD-10-CM | POA: Diagnosis not present

## 2021-11-15 DIAGNOSIS — I10 Essential (primary) hypertension: Secondary | ICD-10-CM

## 2021-11-15 DIAGNOSIS — M25551 Pain in right hip: Secondary | ICD-10-CM

## 2021-11-15 MED ORDER — TRAMADOL HCL 50 MG PO TABS: 50.0000 mg | ORAL_TABLET | Freq: Three times a day (TID) | ORAL | 0 refills | Status: DC | PRN

## 2021-11-15 MED ORDER — BUPROPION HCL ER (XL) 150 MG PO TB24: 150.0000 mg | ORAL_TABLET | Freq: Every day | ORAL | 5 refills | Status: DC

## 2021-11-15 NOTE — Patient Instructions (Addendum)
We will get labs for you today-I will be in touch asap Please go over the [facility] at your convenience and have x-rays done Tramadol as needed for more severe pain   Can use the heartburn medication longer than 2 months if you need to- can try stopping it and see how you do  Taper off prozac - 20 mg daily for about 3 days and then start wellbutrin 150 mg. Can increase to 2 pills after a week if you like Wellbutrin may increase the effect of your metoprolol- watch for slow pulse

## 2021-11-16 ENCOUNTER — Ambulatory Visit
Admission: RE | Admit: 2021-11-16 | Discharge: 2021-11-16 | Disposition: A | Discharge status: Home / Self Care | Payer: PPO | Source: Ambulatory Visit | Attending: Family Medicine | Admitting: Family Medicine

## 2021-11-16 ENCOUNTER — Encounter: Payer: Self-pay | Admitting: Family Medicine

## 2021-11-16 ENCOUNTER — Other Ambulatory Visit: Payer: Self-pay | Admitting: Internal Medicine

## 2021-11-16 DIAGNOSIS — M25552 Pain in left hip: Secondary | ICD-10-CM | POA: Diagnosis not present

## 2021-11-16 DIAGNOSIS — M545 Low back pain, unspecified: Secondary | ICD-10-CM

## 2021-11-16 DIAGNOSIS — M4316 Spondylolisthesis, lumbar region: Secondary | ICD-10-CM | POA: Diagnosis not present

## 2021-11-16 DIAGNOSIS — M25551 Pain in right hip: Secondary | ICD-10-CM | POA: Diagnosis not present

## 2021-11-16 LAB — CBC
HCT: 37.5 % (ref 36.0–46.0)
Hemoglobin: 12.6 g/dL (ref 12.0–15.0)
MCHC: 33.5 g/dL (ref 30.0–36.0)
MCV: 89.6 fl (ref 78.0–100.0)
Platelets: 283 10*3/uL (ref 150.0–400.0)
RBC: 4.19 Mil/uL (ref 3.87–5.11)
RDW: 14.3 % (ref 11.5–15.5)
WBC: 6.7 10*3/uL (ref 4.0–10.5)

## 2021-11-16 LAB — COMPREHENSIVE METABOLIC PANEL WITH GFR
ALT: 22 U/L (ref 0–35)
AST: 20 U/L (ref 0–37)
Albumin: 4.2 g/dL (ref 3.5–5.2)
Alkaline Phosphatase: 36 U/L — ABNORMAL LOW (ref 39–117)
BUN: 17 mg/dL (ref 6–23)
CO2: 29 meq/L (ref 19–32)
Calcium: 9 mg/dL (ref 8.4–10.5)
Chloride: 101 meq/L (ref 96–112)
Creatinine, Ser: 0.75 mg/dL (ref 0.40–1.20)
GFR: 76.01 mL/min
Glucose, Bld: 102 mg/dL — ABNORMAL HIGH (ref 70–99)
Potassium: 3.5 meq/L (ref 3.5–5.1)
Sodium: 137 meq/L (ref 135–145)
Total Bilirubin: 0.5 mg/dL (ref 0.2–1.2)
Total Protein: 6.6 g/dL (ref 6.0–8.3)

## 2021-11-16 LAB — TSH: TSH: 0.89 u[IU]/mL (ref 0.35–5.50)

## 2021-11-16 LAB — VITAMIN D 25 HYDROXY (VIT D DEFICIENCY, FRACTURES): VITD: 61.57 ng/mL (ref 30.00–100.00)

## 2021-11-16 LAB — HEMOGLOBIN A1C: Hgb A1c MFr Bld: 5.7 % (ref 4.6–6.5)

## 2021-11-17 ENCOUNTER — Ambulatory Visit (INDEPENDENT_AMBULATORY_CARE_PROVIDER_SITE_OTHER): Payer: PPO

## 2021-11-17 DIAGNOSIS — I495 Sick sinus syndrome: Secondary | ICD-10-CM

## 2021-11-18 LAB — CUP PACEART REMOTE DEVICE CHECK
Battery Remaining Percentage: 60 %
Brady Statistic RA Percent Paced: 100 %
Brady Statistic RV Percent Paced: 0 %
Date Time Interrogation Session: 20231108065007
Implantable Lead Connection Status: 753985
Implantable Lead Connection Status: 753985
Implantable Lead Implant Date: 20171101
Implantable Lead Implant Date: 20171101
Implantable Lead Location: 753859
Implantable Lead Location: 753860
Implantable Lead Model: 377
Implantable Lead Model: 377
Implantable Lead Serial Number: 49553678
Implantable Lead Serial Number: 49617393
Implantable Pulse Generator Implant Date: 20171101
Lead Channel Impedance Value: 507 Ohm
Lead Channel Impedance Value: 507 Ohm
Lead Channel Pacing Threshold Amplitude: 0.7 V
Lead Channel Pacing Threshold Pulse Width: 0.4 ms
Lead Channel Sensing Intrinsic Amplitude: 11.3 mV
Lead Channel Sensing Intrinsic Amplitude: 2.3 mV
Lead Channel Setting Pacing Amplitude: 2 V
Lead Channel Setting Pacing Amplitude: 2 V
Lead Channel Setting Pacing Pulse Width: 0.4 ms
Pulse Gen Model: 394969
Pulse Gen Serial Number: 68817609

## 2021-11-19 ENCOUNTER — Ambulatory Visit: Payer: PPO

## 2021-11-19 ENCOUNTER — Ambulatory Visit (INDEPENDENT_AMBULATORY_CARE_PROVIDER_SITE_OTHER): Payer: PPO | Admitting: *Deleted

## 2021-11-19 ENCOUNTER — Encounter: Payer: Self-pay | Admitting: Family Medicine

## 2021-11-19 DIAGNOSIS — M545 Low back pain, unspecified: Secondary | ICD-10-CM

## 2021-11-19 DIAGNOSIS — M25552 Pain in left hip: Secondary | ICD-10-CM

## 2021-11-19 DIAGNOSIS — Z0000 Encounter for general adult medical examination without abnormal findings: Secondary | ICD-10-CM | POA: Diagnosis not present

## 2021-11-19 DIAGNOSIS — M25551 Pain in right hip: Secondary | ICD-10-CM

## 2021-11-19 NOTE — Patient Instructions (Signed)
Theresa Mejia , Thank you for taking time to come for your Medicare Wellness Visit. I appreciate your ongoing commitment to your health goals. Please review the following plan we discussed and let me know if I can assist you in the future.   These are the goals we discussed:  Goals      Chronic Care Management Pharmacy Care Plan     CARE PLAN ENTRY (see longitudinal plan of care for additional care plan information)  Current Barriers:  Chronic Disease Management support, education, and care coordination needs related to HTN, HLD, Depression and anxiety, Allergic Rhinitis, Hypothyroidism, GERD, Vitamin B12 Deficiency, Vitamin D Deficiency, Osteopenia   Hypertension BP Readings from Last 3 Encounters:  08/25/20 120/72  08/06/20 110/70  07/14/20 126/80  Pharmacist Clinical Goal(s): Over the next 90 days, patient will work with PharmD and providers to maintain BP goal <140/90 Current regimen:  Metoprolol ER '25mg'$  - take 0.5 tablet = 12.'5mg'$  daily  Interventions: Requested patient to check blood pressure 2 to 3 times per week and record Also check blood pressure when feeling lightheaded Patient self care activities - Over the next 90 days, patient will: Check blood pressure 2 to 3 times per week and when feeling lightheaded, document, and provide at future appointments Ensure daily salt intake < 2300 mg/day  Hyperlipidemia Lab Results  Component Value Date/Time   LDLCALC 72 10/24/2019 10:20 AM   LDLDIRECT 148.3 09/01/2010 10:41 AM  Pharmacist Clinical Goal(s): Over the next 90 days, patient will work with PharmD and providers to achieve LDL goal < 100 Current regimen:  Simvastatin '20mg'$  daily Interventions: Discussed LDL goals  Patient self care activities - Over the next 90 days, patient will: Maintain cholesterol medication regimen.   Anxiety / Depression / sleeplessness: Pharmacist Clinical Goal(s): Over the next 90 days, patient will work with PharmD and providers to minimize  anxiety and depression symptoms. Also limit potential side effects from medication therapy Current regimen:  Fluoxetine '20mg'$  - take 1 tablet twice a day (total daily dose = '40mg'$ ) Clonazepam 0.'5mg'$  - take 0.5 tablet at bedtime as needed for sleep  Interventions: Discussed timing of onset of lightheadedness and change to fluoxetine - could be related.  Recommended trial of melatonin for sleep Patient self care activities - Over the next 90 days, patient will: Continue to take fluoxetine and clonazepam sparingly if needed for sleep  Osteopenia Pharmacist Clinical Goal(s) Over the next 90 days, patient will work with PharmD and providers to reduce risk of fracture due to osteopenia Current regimen:  Vitamin D 2000 units every day Tums '500mg'$  as needed Prolia '60mg'$  - received injection every 6 months. Interventions: Next Prolia due 06/08/2021 or after Patient self care activities - Over the next 90 days, patient will: Continue Prolia, vitamin D and Tums Due to recheck bone density 04/28/2021  hemorrhoids / constipation:  Patient to see Dr Earlean Shawl in May for hemorrhoids. Will have banding procedure. Continue to use Benefiber daily and Miralax as needed for constipation  Medication management Pharmacist Clinical Goal(s): Over the next 90 days, patient will work with PharmD and providers to maintain optimal medication adherence Current pharmacy: [facility] Interventions Comprehensive medication review performed. Continue current medication management strategy Patient self care activities - Over the next 90 days, patient will: Focus on medication adherence by filling and taking medications appropriately  Take medications as prescribed Report any questions or concerns to PharmD and/or provider(s)  Patient Goals/Self-Care Activities Over the next 90 days, patient will:  take medications  as prescribed,  check blood pressure  and heart rate 2 to 3 times per week , document, and provide at future  appointments, and  Recommend regular exercise as able and work up to goal of 150 minutes per week.   Follow Up Plan: No further follow up required: patient is doing well. She is aware she can reactivate Chronic Care Management services if needed and has phone number of clinical pharmacist if needed.       Please see past updates related to this goal by clicking on the "Past Updates" button in the selected goal       Complete fasting lipid panel at next office visit     Increase physical activity     LDL goal <100        This is a list of the screening recommended for you and due dates:  Health Maintenance  Topic Date Due   COVID-19 Vaccine (6 - Pfizer series) 02/20/2021   Medicare Annual Wellness Visit  11/20/2022   Colon Cancer Screening  07/21/2025   Tetanus Vaccine  10/22/2028   Pneumonia Vaccine  Completed   Flu Shot  Completed   DEXA scan (bone density measurement)  Completed   Hepatitis C Screening: USPSTF Recommendation to screen - Ages 104-79 yo.  Completed   Zoster (Shingles) Vaccine  Completed   HPV Vaccine  Aged Out     Next appointment: Follow up in one year for your annual wellness visit.   Preventive Care 34 Years and Older, Female Preventive care refers to lifestyle choices and visits with your health care provider that can promote health and wellness. What does preventive care include? A yearly physical exam. This is also called an annual well check. Dental exams once or twice a year. Routine eye exams. Ask your health care provider how often you should have your eyes checked. Personal lifestyle choices, including: Daily care of your teeth and gums. Regular physical activity. Eating a healthy diet. Avoiding tobacco and drug use. Limiting alcohol use. Practicing safe sex. Taking low-dose aspirin every day. Taking vitamin and mineral supplements as recommended by your health care provider. What happens during an annual well check? The services and  screenings done by your health care provider during your annual well check will depend on your age, overall health, lifestyle risk factors, and family history of disease. Counseling  Your health care provider may ask you questions about your: Alcohol use. Tobacco use. Drug use. Emotional well-being. Home and relationship well-being. Sexual activity. Eating habits. History of falls. Memory and ability to understand (cognition). Work and work Statistician. Reproductive health. Screening  You may have the following tests or measurements: Height, weight, and BMI. Blood pressure. Lipid and cholesterol levels. These may be checked every 5 years, or more frequently if you are over 64 years old. Skin check. Lung cancer screening. You may have this screening every year starting at age 40 if you have a 30-pack-year history of smoking and currently smoke or have quit within the past 15 years. Fecal occult blood test (FOBT) of the stool. You may have this test every year starting at age 82. Flexible sigmoidoscopy or colonoscopy. You may have a sigmoidoscopy every 5 years or a colonoscopy every 10 years starting at age 78. Hepatitis C blood test. Hepatitis B blood test. Sexually transmitted disease (STD) testing. Diabetes screening. This is done by checking your blood sugar (glucose) after you have not eaten for a while (fasting). You may have this done every 1-3  years. Bone density scan. This is done to screen for osteoporosis. You may have this done starting at age 59. Mammogram. This may be done every 1-2 years. Talk to your health care provider about how often you should have regular mammograms. Talk with your health care provider about your test results, treatment options, and if necessary, the need for more tests. Vaccines  Your health care provider may recommend certain vaccines, such as: Influenza vaccine. This is recommended every year. Tetanus, diphtheria, and acellular pertussis (Tdap,  Td) vaccine. You may need a Td booster every 10 years. Zoster vaccine. You may need this after age 80. Pneumococcal 13-valent conjugate (PCV13) vaccine. One dose is recommended after age 46. Pneumococcal polysaccharide (PPSV23) vaccine. One dose is recommended after age 54. Talk to your health care provider about which screenings and vaccines you need and how often you need them. This information is not intended to replace advice given to you by your health care provider. Make sure you discuss any questions you have with your health care provider. Document Released: 01/23/2015 Document Revised: 09/16/2015 Document Reviewed: 10/28/2014 Elsevier Interactive Patient Education  2017 [place] Prevention in the Home Falls can cause injuries. They can happen to people of all ages. There are many things you can do to make your home safe and to help prevent falls. What can I do on the outside of my home? Regularly fix the edges of walkways and driveways and fix any cracks. Remove anything that might make you trip as you walk through a door, such as a raised step or threshold. Trim any bushes or trees on the path to your home. Use bright outdoor lighting. Clear any walking paths of anything that might make someone trip, such as rocks or tools. Regularly check to see if handrails are loose or broken. Make sure that both sides of any steps have handrails. Any raised decks and porches should have guardrails on the edges. Have any leaves, snow, or ice cleared regularly. Use sand or salt on walking paths during winter. Clean up any spills in your garage right away. This includes oil or grease spills. What can I do in the bathroom? Use night lights. Install grab bars by the toilet and in the tub and shower. Do not use towel bars as grab bars. Use non-skid mats or decals in the tub or shower. If you need to sit down in the shower, use a plastic, non-slip stool. Keep the floor dry. Clean up any  water that spills on the floor as soon as it happens. Remove soap buildup in the tub or shower regularly. Attach bath mats securely with double-sided non-slip rug tape. Do not have throw rugs and other things on the floor that can make you trip. What can I do in the bedroom? Use night lights. Make sure that you have a light by your bed that is easy to reach. Do not use any sheets or blankets that are too big for your bed. They should not hang down onto the floor. Have a firm chair that has side arms. You can use this for support while you get dressed. Do not have throw rugs and other things on the floor that can make you trip. What can I do in the kitchen? Clean up any spills right away. Avoid walking on wet floors. Keep items that you use a lot in easy-to-reach places. If you need to reach something above you, use a strong step stool that has a grab  bar. Keep electrical cords out of the way. Do not use floor polish or wax that makes floors slippery. If you must use wax, use non-skid floor wax. Do not have throw rugs and other things on the floor that can make you trip. What can I do with my stairs? Do not leave any items on the stairs. Make sure that there are handrails on both sides of the stairs and use them. Fix handrails that are broken or loose. Make sure that handrails are as long as the stairways. Check any carpeting to make sure that it is firmly attached to the stairs. Fix any carpet that is loose or worn. Avoid having throw rugs at the top or bottom of the stairs. If you do have throw rugs, attach them to the floor with carpet tape. Make sure that you have a light switch at the top of the stairs and the bottom of the stairs. If you do not have them, ask someone to add them for you. What else can I do to help prevent falls? Wear shoes that: Do not have high heels. Have rubber bottoms. Are comfortable and fit you well. Are closed at the toe. Do not wear sandals. If you use a  stepladder: Make sure that it is fully opened. Do not climb a closed stepladder. Make sure that both sides of the stepladder are locked into place. Ask someone to hold it for you, if possible. Clearly mark and make sure that you can see: Any grab bars or handrails. First and last steps. Where the edge of each step is. Use tools that help you move around (mobility aids) if they are needed. These include: Canes. Walkers. Scooters. Crutches. Turn on the lights when you go into a dark area. Replace any light bulbs as soon as they burn out. Set up your furniture so you have a clear path. Avoid moving your furniture around. If any of your floors are uneven, fix them. If there are any pets around you, be aware of where they are. Review your medicines with your doctor. Some medicines can make you feel dizzy. This can increase your chance of falling. Ask your doctor what other things that you can do to help prevent falls. This information is not intended to replace advice given to you by your health care provider. Make sure you discuss any questions you have with your health care provider. Document Released: 10/23/2008 Document Revised: 06/04/2015 Document Reviewed: 01/31/2014 Elsevier Interactive Patient Education  2017 [employer].

## 2021-11-19 NOTE — Progress Notes (Addendum)
Subjective:   Theresa Mejia is a 81 y.o. female who presents for Medicare Annual (Subsequent) preventive examination.  I connected with  Theresa Mejia on 11/19/21 by a audio enabled telemedicine application and verified that I am speaking with the correct person using two identifiers.  Patient Location: Home  Provider Location: Office/Clinic  I discussed the limitations of evaluation and management by telemedicine. The patient expressed understanding and agreed to proceed.   Review of Systems    Defer to PCP Cardiac Risk Factors include: advanced age (>37mn, >>71women);dyslipidemia;hypertension     Objective:    Today's Vitals   11/19/21 0902  PainSc: 7    There is no height or weight on file to calculate BMI.     11/19/2021    9:09 AM 05/31/2021   12:24 PM 11/17/2020   11:44 AM 08/01/2019   10:29 AM 11/19/2018    3:31 PM 11/14/2017    1:30 PM 11/03/2016   10:31 AM  Advanced Directives  Does Patient Have a Medical Advance Directive? Yes Yes Yes Yes Yes Yes Yes  Type of AParamedicof AOrientLiving will HWest[place]Living will HWildwoodLiving will HAtwoodLiving will HSintonLiving will HCalvert BeachLiving will HVirdenLiving will  Does patient want to make changes to medical advance directive? No - Patient declined   No - Patient declined No - Patient declined    Copy of HE[place]in Chart? Yes - validated most recent copy scanned in chart (See row information)  Yes - validated most recent copy scanned in chart (See row information) No - copy requested Yes - validated most recent copy scanned in chart (See row information) Yes Yes    Current Medications (verified) Outpatient Encounter Medications as of 11/19/2021  Medication Sig   buPROPion (WELLBUTRIN XL) 150 MG 24 hr tablet Take 1 tablet (150 mg total) by mouth  daily. Increase to 300 mg after 1 week if needed   calcium carbonate (TUMS - DOSED IN MG ELEMENTAL CALCIUM) 500 MG chewable tablet Chew 1 tablet by mouth 4 (four) times daily as needed for indigestion or heartburn.   clonazePAM (KLONOPIN) 0.5 MG tablet TAKE 1/2 AT BEDTIME AS NEEDED FOR INSOMNIA, MAY INCREASE TO A WHOLE TABLET IF NEEDED   D-5000 125 MCG (5000 UT) TABS SMARTSIG:1 By Mouth   denosumab (PROLIA) 60 MG/ML SOSY injection Inject 60 mg into the skin every 6 (six) months.   fluticasone (FLONASE) 50 MCG/ACT nasal spray SPRAY 2 SPRAYS INTO EACH NOSTRIL EVERY DAY   levocetirizine (XYZAL) 5 MG tablet TAKE 1 TABLET BY MOUTH EVERY DAY IN THE EVENING   levothyroxine (SYNTHROID) 125 MCG tablet Take 1 tablet by mouth in the morning.   metoprolol succinate (TOPROL-XL) 25 MG 24 hr tablet TAKE 1/2 TABLET BY MOUTH EVERY DAY   pantoprazole (PROTONIX) 40 MG tablet Take 1 tablet (40 mg total) by mouth daily. Take 30 minutes before eating.   polyethylene glycol (MIRALAX / GLYCOLAX) 17 g packet Take 17 g by mouth daily as needed.   Polyvinyl Alcohol-Povidone (REFRESH OP) Place 1 drop into both eyes daily as needed (dry eyes).   simvastatin (ZOCOR) 20 MG tablet TAKE 1 TABLET BY MOUTH EVERYDAY AT BEDTIME   traMADol (ULTRAM) 50 MG tablet Take 1 tablet (50 mg total) by mouth every 8 (eight) hours as needed for up to 5 days.   Wheat Dextrin (BENEFIBER) POWD  Take 1-2 Scoops by mouth every morning. (Take 1 to 2 teaspoons each morning mixed with beverage of choice)   [DISCONTINUED] famotidine (PEPCID) 10 MG tablet Take 10 mg by mouth as needed for heartburn or indigestion.   [DISCONTINUED] FLUoxetine (PROZAC) 40 MG capsule Take by mouth.   No facility-administered encounter medications on file as of 11/19/2021.    Allergies (verified) Ramipril, Sulfa antibiotics, and Sulfonamide derivatives   History: Past Medical History:  Diagnosis Date   Allergic rhinitis    Depression    DJD (degenerative joint  disease)    GERD (gastroesophageal reflux disease)    Headache(784.0)    sinus   Hypercholesteremia    Hypersomnia    Hypertension    Hypothyroidism    Internal hemorrhoid 11/2008   s/p banding ([place])   Lumbar disc disease    OBSTRUCTIVE SLEEP APNEA    NPSG 04/29/07- AHI 1.1/hr, RDI 21.2/hr. Weight 176 lbs. CPAP was tried based on the RDI.     OVERACTIVE BLADDER    pt unaware   Presence of permanent cardiac pacemaker 11/11/2015   Reactive depression (situational)    Sinus node dysfunction (HCC)    Spondylosis, cervical    Past Surgical History:  Procedure Laterality Date   BIOPSY BREAST     BREAST EXCISIONAL BIOPSY Left 2011   CATARACT EXTRACTION W/ INTRAOCULAR LENS IMPLANT Right    COLONOSCOPY     EP IMPLANTABLE DEVICE N/A 11/11/2015   Procedure: Pacemaker Implant;  Surgeon: Evans Lance, MD;  Location: [facility];  Service: Cardiovascular;  Laterality: N/A;   eye lid lift     INSERT / REPLACE / REMOVE PACEMAKER  11/11/2015   KNEE ARTHROSCOPY     Dr. [name]   MOUTH SURGERY  08/25/2017   implant fell out due to bone loss   REVERSE SHOULDER ARTHROPLASTY Right 08/08/2019   Procedure: REVERSE SHOULDER ARTHROPLASTY;  Surgeon: Tania Ade, MD;  Location: [facility];  Service: Orthopedics;  Laterality: Right;   rotator cuff surgery Right    Dr. [name]   TUBAL LIGATION     Family History  Problem Relation Age of Onset   Macular degeneration Mother    Hypothyroidism Mother    Tremor Mother    Stroke Mother 33   Crohn's disease Father 19   Alzheimer's disease Paternal Aunt    Alzheimer's disease Paternal Uncle    Parkinsonism Maternal Grandmother    Diabetes Paternal Grandmother    Colon cancer Neg Hx    Esophageal cancer Neg Hx    Rectal cancer Neg Hx    Social History   Socioeconomic History   Marital status: Married    Spouse name: Not on file   Number of children: 2   Years of education: Not on file   Highest education level: Not on file   Occupational History   Occupation: Solicitor radiology-retire    Employer: [employer]    Comment: data entry  Tobacco Use   Smoking status: Former    Years: 8.00    Types: Cigarettes    Quit date: 01/10/1966    Years since quitting: 55.8   Smokeless tobacco: Never  Vaping Use   Vaping Use: Never used  Substance and Sexual Activity   Alcohol use: No   Drug use: No   Sexual activity: Never    Birth control/protection: Post-menopausal  Other Topics Concern   Not on file  Social History Narrative   married, lives with spouse -   Retired  05/2013   Social Determinants of Health   Financial Resource Strain: Low Risk  (02/16/2021)   Overall Financial Resource Strain (CARDIA)    Difficulty of Paying Living Expenses: Not hard at all  Food Insecurity: No Food Insecurity (11/19/2021)   Hunger Vital Sign    Worried About Running Out of Food in the Last Year: Never true    Ran Out of Food in the Last Year: Never true  Transportation Needs: No Transportation Needs (11/19/2021)   PRAPARE - Hydrologist (Medical): No    Lack of Transportation (Non-Medical): No  Physical Activity: Insufficiently Active (04/06/2021)   Exercise Vital Sign    Days of Exercise per Week: 2 days    Minutes of Exercise per Session: 40 min  Stress: No Stress Concern Present (11/17/2020)   [place]    Feeling of Stress : Not at all  Social Connections: [place] (11/17/2020)   Social Connection and Isolation Panel [NHANES]    Frequency of Communication with Friends and Family: More than three times a week    Frequency of Social Gatherings with Friends and Family: More than three times a week    Attends Religious Services: More than 4 times per year    Active Member of [employer] or Organizations: Yes    Attends Music therapist: More than 4 times per year    Marital Status: Married    Tobacco  Counseling Counseling given: Not Answered   Clinical Intake:  Pre-visit preparation completed: Yes  Pain : 0-10 Pain Score: 7  Pain Type: Chronic pain Pain Location: Hip Pain Orientation: Right Pain Descriptors / Indicators: Aching Pain Onset: More than a month ago Pain Frequency: Intermittent  Diabetes: No  How often do you need to have someone help you when you read instructions, pamphlets, or other written materials from your doctor or pharmacy?: 1 - Never   Activities of Daily Living    11/19/2021    9:14 AM  In your present state of health, do you have any difficulty performing the following activities:  Hearing? 1  Comment wears hearing aids  Vision? 0  Difficulty concentrating or making decisions? 1  Walking or climbing stairs? 1  Comment right hip pain  Dressing or bathing? 0  Doing errands, shopping? 0  Preparing Food and eating ? N  Using the Toilet? N  In the past six months, have you accidently leaked urine? Y  Comment just once when she waited too long to go to the restroom  Do you have problems with loss of bowel control? N  Managing your Medications? N  Managing your Finances? N  Housekeeping or managing your Housekeeping? N    Patient Care Team: Copland, Gay Filler, MD as PCP - General (Family Medicine) Deneise Lever, MD (Pulmonary Disease) Gaynelle Arabian, MD (Orthopedic Surgery) Richmond Campbell, MD (Gastroenterology)  Indicate any recent Medical Services you may have received from other than [facility] providers in the past year (date may be approximate).     Assessment:   This is a routine wellness examination for Denette.  Hearing/Vision screen No results found.  Dietary issues and exercise activities discussed: Current Exercise Habits: Structured exercise class (chair yoga), Type of exercise: stretching, Time (Minutes): 30, Frequency (Times/Week): 4, Weekly Exercise (Minutes/Week): 120, Intensity: Mild, Exercise limited by: orthopedic  condition(s) (right hip bursitis)   Goals Addressed   None    Depression Screen  11/19/2021    9:12 AM 09/23/2021    1:17 PM 11/17/2020   11:47 AM 08/06/2020    3:57 PM 08/22/2019    2:40 PM 11/19/2018    3:32 PM 11/14/2017    1:30 PM  PHQ 2/9 Scores  PHQ - 2 Score 2 0 1 0 1 0 6  PHQ- 9 Score  '1   5  21    '$ Fall Risk    11/19/2021    9:11 AM 11/15/2021    3:29 PM 11/17/2020   11:45 AM 08/06/2020    3:57 PM 08/06/2019    1:26 PM  [place] in the past year? 0 0 0 0 0  Comment     [place]i Telephone Survey: data to providers prior to load  Number falls in past yr: 0 0 0 0   Injury with Fall? 0 0 0 0   Risk for fall due to : No Fall Risks   No Fall Risks   Follow up Falls evaluation completed Falls evaluation completed Falls prevention discussed Falls evaluation completed     [place]:  Any stairs in or around the home? Yes  If so, are there any without handrails? No  Home free of loose throw rugs in walkways, pet beds, electrical cords, etc? Yes  Adequate lighting in your home to reduce risk of falls? Yes   ASSISTIVE DEVICES UTILIZED TO PREVENT FALLS:  Life alert?  Has one but doesn't wear it Use of a cane, walker or w/c? No  Grab bars in the bathroom? Yes  Shower chair or bench in shower? Yes  Elevated toilet seat or a handicapped toilet? Yes   TIMED UP AND GO:  Was the test performed?  No, audio visit .    Cognitive Function:    11/03/2016   10:40 AM  MMSE - Mini Mental State Exam  Orientation to time 5  Orientation to Place 5  Registration 3  Attention/ Calculation 5  Recall 3  Language- name 2 objects 2  Language- repeat 1  Language- follow 3 step command 3  Language- read & follow direction 1  Write a sentence 1  Copy design 1  Total score 30        11/19/2021    9:19 AM 11/19/2018    3:37 PM  6CIT Screen  What Year? 0 points 0 points  What month? 0 points 0 points  What time? 0 points 0 points  Count  back from 20 0 points 0 points  Months in reverse 0 points 0 points  Repeat phrase 0 points 0 points  Total Score 0 points 0 points    Immunizations Immunization History  Administered Date(s) Administered   Influenza Split 10/11/2010, 10/04/2013, 10/24/2021   Influenza Whole 10/13/2009   Influenza, High Dose Seasonal PF 10/14/2016, 10/22/2017, 10/22/2018   Influenza-Unspecified 10/28/2015, 10/17/2019, 10/20/2020   PFIZER(Purple Top)SARS-COV-2 Vaccination 01/24/2019, 02/11/2019, 10/03/2019, 04/20/2020   Pfizer Covid-19 Vaccine Bivalent Booster 84yr & up 10/20/2020   Pneumococcal Conjugate-13 11/06/2014   Pneumococcal Polysaccharide-23 02/25/2008   Td 02/25/2008   Tdap 10/23/2018   Zoster Recombinat (Shingrix) 04/27/2017, 08/01/2017    TDAP status: Up to date  Flu Vaccine status: Up to date  Pneumococcal vaccine status: Up to date  Covid-19 vaccine status: Information provided on how to obtain vaccines.   Qualifies for Shingles Vaccine? Yes   Zostavax completed No   Shingrix Completed?: Yes  Screening Tests Health Maintenance  Topic  Date Due   COVID-19 Vaccine (6 - Pfizer series) 02/20/2021   Medicare Annual Wellness (AWV)  11/17/2021   COLONOSCOPY (Pts 45-70yr Insurance coverage will need to be confirmed)  07/21/2025   TETANUS/TDAP  10/22/2028   Pneumonia Vaccine 81 Years old  Completed   INFLUENZA VACCINE  Completed   DEXA SCAN  Completed   Hepatitis C Screening  Completed   Zoster Vaccines- Shingrix  Completed   HPV VACCINES  Aged Out    Health Maintenance  Health Maintenance Due  Topic Date Due   COVID-19 Vaccine (6 - Pfizer series) 02/20/2021   Medicare Annual Wellness (AWV)  11/17/2021    Colorectal cancer screening: Type of screening: Colonoscopy. Completed 07/21/20. Repeat every 5 years  Mammogram status: Completed 01/27/21. Repeat every year  Bone Density status: Completed 04/28/21. Results reflect: Bone density results: OSTEOPENIA. Repeat every 2  years.  Lung Cancer Screening: (Low Dose CT Chest recommended if Age 79-80years, 30 pack-year currently smoking OR have quit w/in 15years.) does not qualify.    Additional Screening:  Hepatitis C Screening: does qualify; Completed 06/04/15  Vision Screening: Recommended annual ophthalmology exams for early detection of glaucoma and other disorders of the eye. Is the patient up to date with their annual eye exam?  Yes  Who is the provider or what is the name of the office in which the patient attends annual eye exams? Dr. LPrudencio BurlyIf pt is not established with a provider, would they like to be referred to a provider to establish care? No .   Dental Screening: Recommended annual dental exams for proper oral hygiene  Community Resource Referral / Chronic Care Management: CRR required this visit?  No   CCM required this visit?  No      Plan:     I have personally reviewed and noted the following in the patient's chart:   Medical and social history Use of alcohol, tobacco or illicit drugs  Current medications and supplements including opioid prescriptions. Patient is currently taking opioid prescriptions. Information provided to patient regarding non-opioid alternatives. Patient advised to discuss non-opioid treatment plan with their provider. Functional ability and status Nutritional status Physical activity Advanced directives List of other physicians Hospitalizations, surgeries, and ER visits in previous 12 months Vitals Screenings to include cognitive, depression, and falls Referrals and appointments  In addition, I have reviewed and discussed with patient certain preventive protocols, quality metrics, and best practice recommendations. A written personalized care plan for preventive services as well as general preventive health recommendations were provided to patient.   Due to this being a telephonic visit, the after visit summary with patients personalized plan was offered  to patient via mail or my-chart. Patient would like to access on my-chart.  BBeatris Ship CFleming  11/19/2021   Nurse Notes: None   I have reviewed and agree with [facility] documentation.  JKathlene November MD

## 2021-11-19 NOTE — Addendum Note (Signed)
Addended by: Lamar Blinks C on: 11/19/2021 02:36 PM   Modules accepted: Orders

## 2021-11-25 ENCOUNTER — Telehealth: Payer: Self-pay | Admitting: Family Medicine

## 2021-11-25 NOTE — Telephone Encounter (Signed)
Kamika with [facility] called to request referral to be faxed again to their office at [phone].

## 2021-11-29 NOTE — Telephone Encounter (Signed)
Forwarding to Rx prior auth team.

## 2021-11-29 NOTE — Telephone Encounter (Signed)
Last Prolia injection 09/22/21 Next Prolia injection due 03/22/22

## 2021-12-01 DIAGNOSIS — M5416 Radiculopathy, lumbar region: Secondary | ICD-10-CM | POA: Diagnosis not present

## 2021-12-01 DIAGNOSIS — M545 Low back pain, unspecified: Secondary | ICD-10-CM | POA: Diagnosis not present

## 2021-12-06 NOTE — Progress Notes (Signed)
Remote pacemaker transmission.   

## 2021-12-07 IMAGING — MR MR SHOULDER*R* W/O CM
5 series · 38 of 40 positions shown · non-contrast
Comparison: None.

CLINICAL DATA: Right shoulder pain for 6 months. Remote history of
right shoulder surgery.

EXAM:
MRI OF THE RIGHT SHOULDER WITHOUT CONTRAST
TECHNIQUE: Multiplanar, multisequence MR imaging of the shoulder was performed.
No intravenous contrast was administered.

[Series 9: PD fat-sat · axial · right · 4.0mm · 0.36mm/px · z∈[-36,+72]mm · 8 of 25 slices shown (1 of 2)]
[im 1/25]
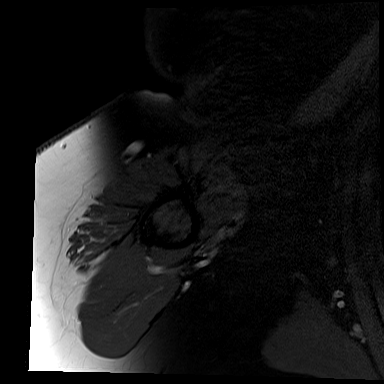
[im 3/25]
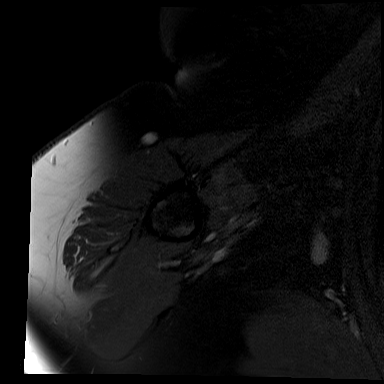
[im 9/25]
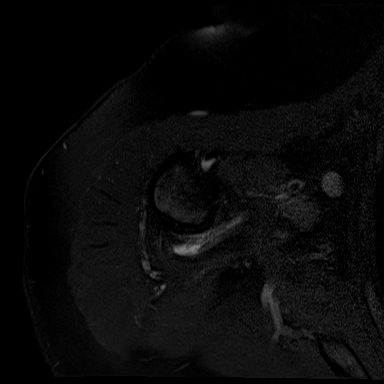
[im 11/25]
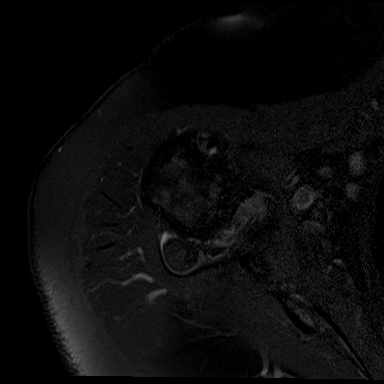
[im 14/25]
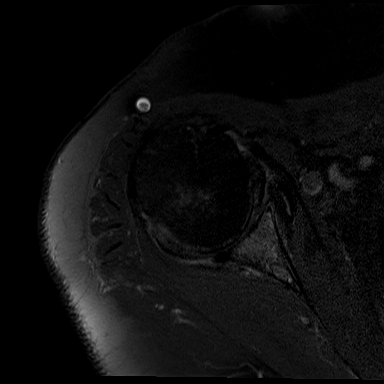
[im 17/25]
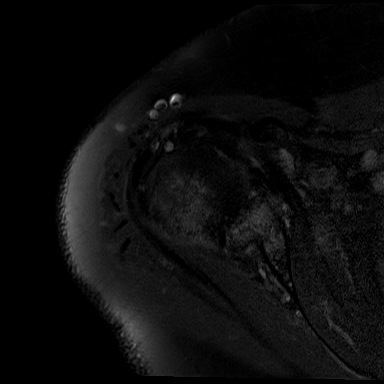
[im 22/25]
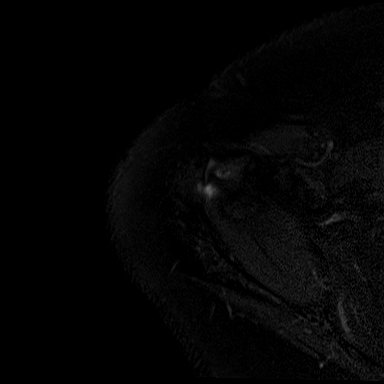
[im 25/25]
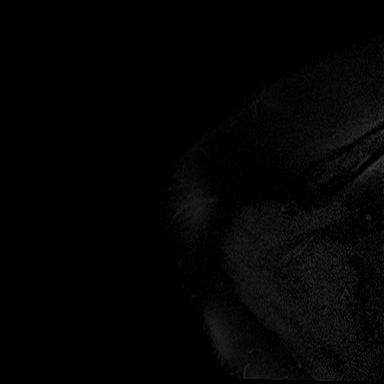

[Series 10: T2 fat-sat · oblique · right · 4.0mm · 0.44mm/px · 8 of 22 slices shown (1 of 2)]
[im 1/22]
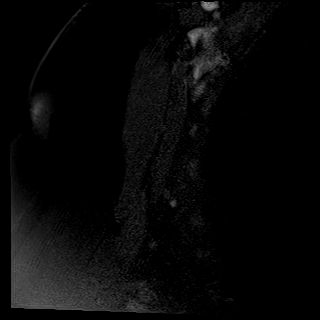
[im 4/22]
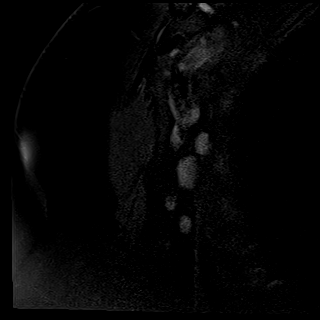
[im 7/22]
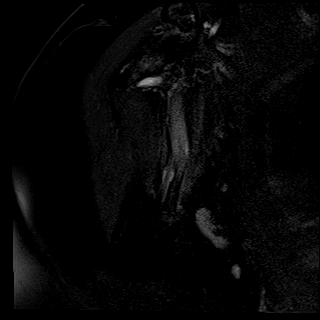
[im 10/22]
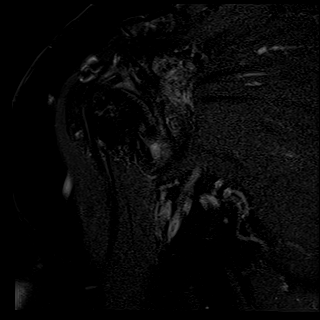
[im 13/22]
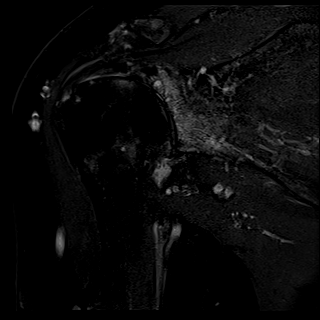
[im 16/22]
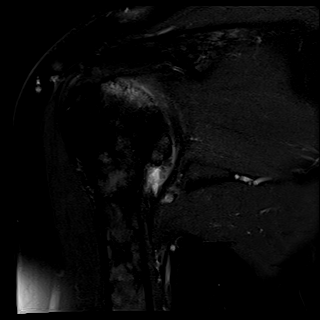
[im 19/22]
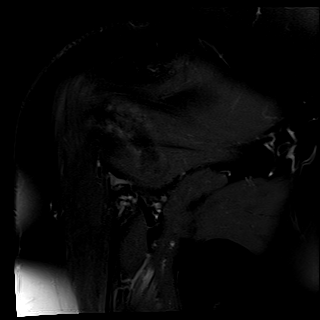
[im 22/22]
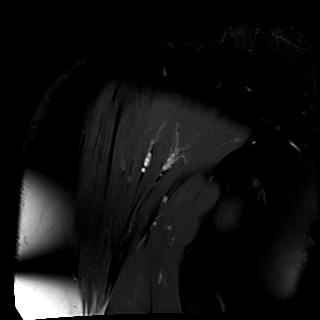

[Series 11: PD fat-sat · oblique · right · 4.0mm · 0.39mm/px · 8 of 22 slices shown (2 of 2)]
[im 1/22]
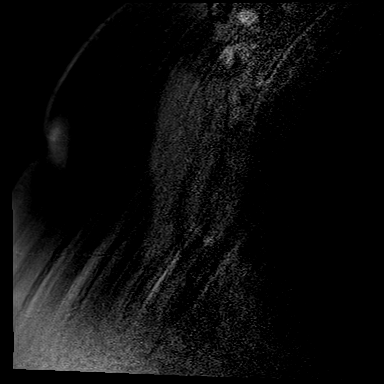
[im 4/22]
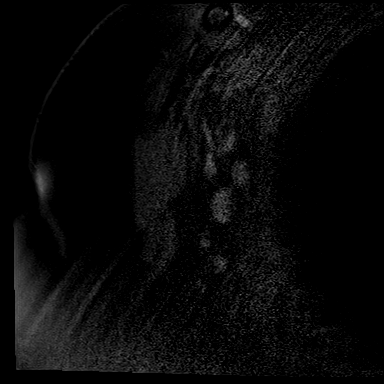
[im 7/22]
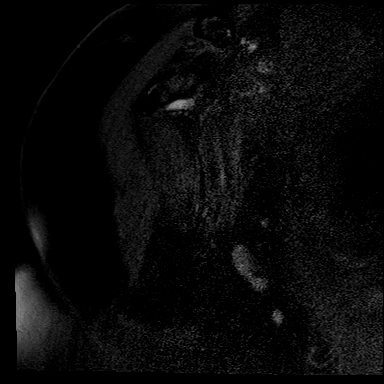
[im 10/22]
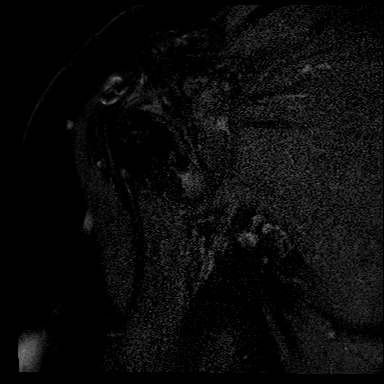
[im 13/22]
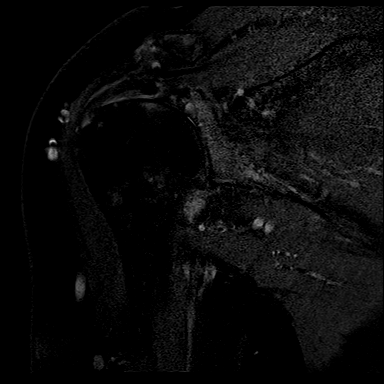
[im 16/22]
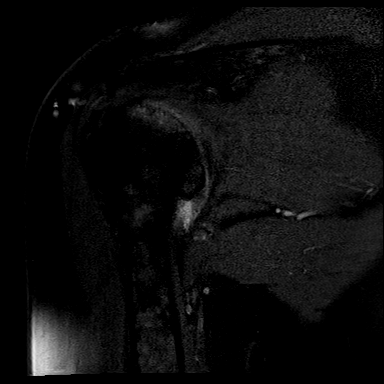
[im 19/22]
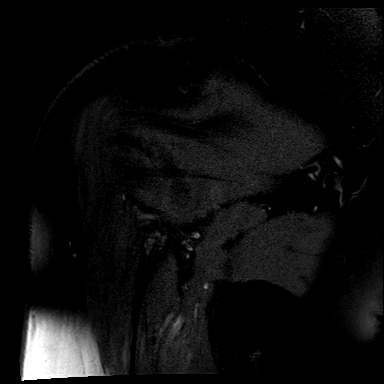
[im 22/22]
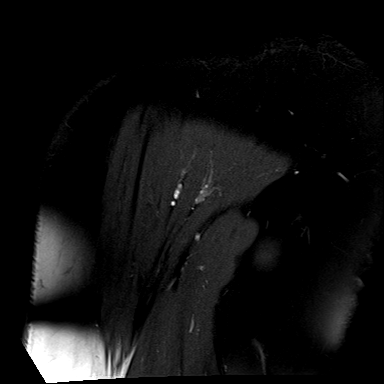

[Series 12: T2 fat-sat · oblique · right · 4.0mm · 0.44mm/px · 7 of 20 slices shown (2 of 2)]
[im 1/20]
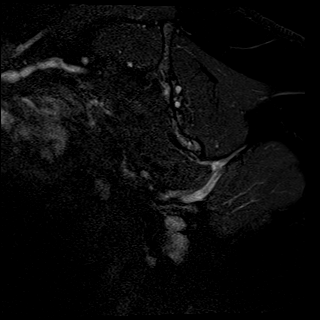
[im 4/20]
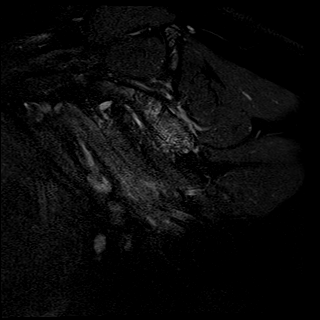
[im 7/20]
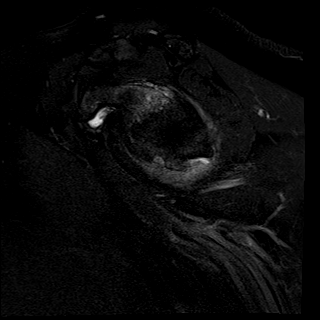
[im 10/20]
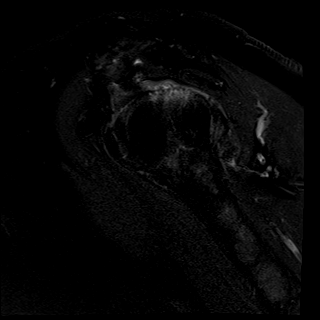
[im 13/20]
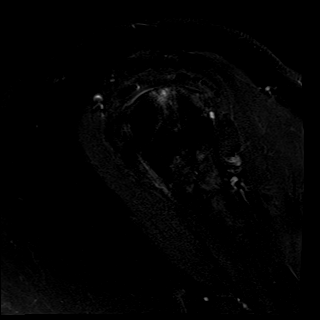
[im 16/20]
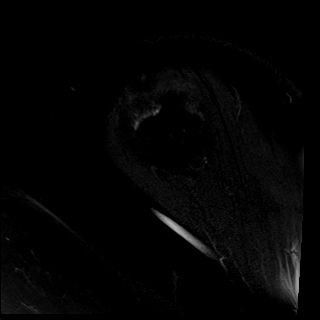
[im 20/20]
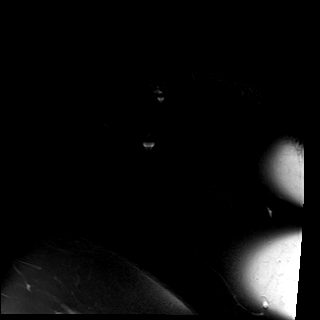

[Series 13: T1 · oblique · right · 4.0mm · 0.36mm/px · 7 of 20 slices shown]
[im 1/20]
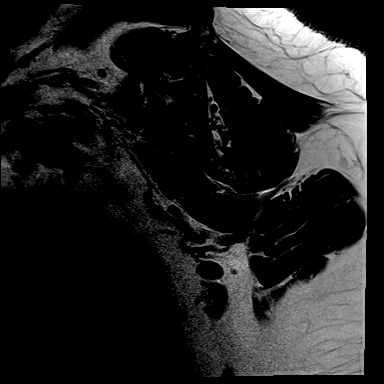
[im 4/20]
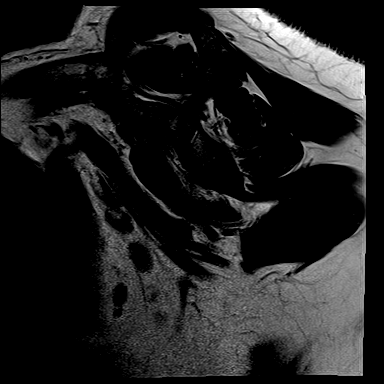
[im 7/20]
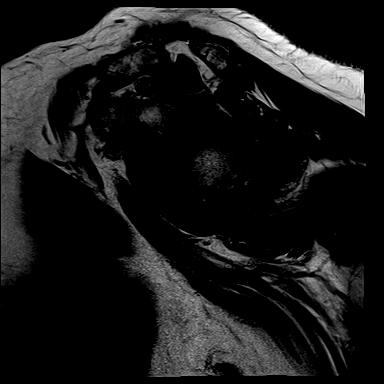
[im 10/20]
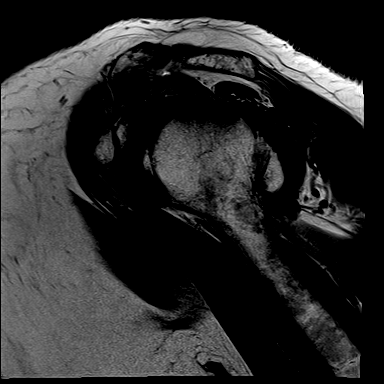
[im 13/20]
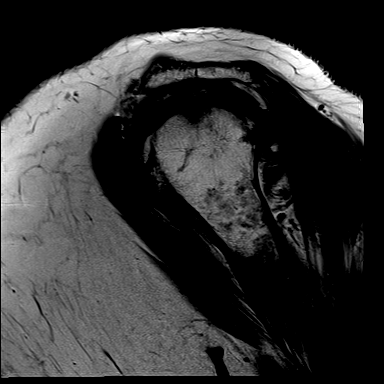
[im 16/20]
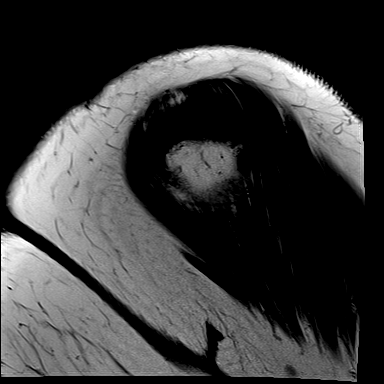
[im 20/20]
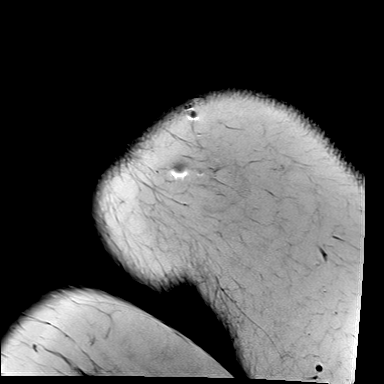

[38 of 40 positions shown; findings below may reference images not displayed]

FINDINGS: Rotator cuff: Intact. Supraspinatus and infraspinatus tendinopathy
noted.

Muscles:  No atrophy or focal lesion.

Biceps long head:  Intact.

Acromioclavicular Joint: Resected without evidence of complication.
Type 1 acromion. The patient appears to be status post
acromioplasty. No evidence of bursitis.

Glenohumeral Joint: The patient has severe glenohumeral
osteoarthritis. There is bone-on-bone joint space narrowing, a large
osteophyte off the humeral head and extensive subchondral edema in
the glenoid. Small glenohumeral joint effusion contains debris.

Labrum:  Diffusely and severely degenerated.

Bones:  No fracture or worrisome lesion.

Other: None.
IMPRESSION: Dominant finding is severe glenohumeral osteoarthritis.

Intact rotator cuff with mild appearing supraspinatus and
infraspinatus tendinopathy.

Status post resection of the AC joint and acromioplasty without
evidence of complication.

## 2021-12-13 DIAGNOSIS — M545 Low back pain, unspecified: Secondary | ICD-10-CM | POA: Diagnosis not present

## 2021-12-13 DIAGNOSIS — M5416 Radiculopathy, lumbar region: Secondary | ICD-10-CM | POA: Diagnosis not present

## 2021-12-20 ENCOUNTER — Other Ambulatory Visit: Payer: Self-pay | Admitting: Internal Medicine

## 2021-12-20 ENCOUNTER — Other Ambulatory Visit: Payer: Self-pay

## 2021-12-20 ENCOUNTER — Telehealth: Payer: Self-pay | Admitting: Family Medicine

## 2021-12-20 MED ORDER — LEVOTHYROXINE SODIUM 125 MCG PO TABS: 125.0000 ug | ORAL_TABLET | Freq: Every morning | ORAL | 0 refills | Status: DC

## 2021-12-20 NOTE — Telephone Encounter (Signed)
Refilled

## 2021-12-20 NOTE — Telephone Encounter (Signed)
Prescription Request  12/20/2021  Is this a "Controlled Substance" medicine? No  LOV: 11/15/2021  What is the name of the medication or equipment?   levothyroxine (SYNTHROID) 75 MCG tablet [[id number]]  DISCONTINUED   Have you contacted your pharmacy to request a refill? Yes   Which pharmacy would you like this sent to?   [facility], [place] - [address] [place] [address] Phone: [phone] Fax: [phone]   Patient notified that their request is being sent to the clinical staff for review and that they should receive a response within 2 business days.   Please advise at Mobile [phone] (mobile)

## 2021-12-24 ENCOUNTER — Encounter: Payer: Self-pay | Admitting: Family Medicine

## 2021-12-24 DIAGNOSIS — K219 Gastro-esophageal reflux disease without esophagitis: Secondary | ICD-10-CM

## 2021-12-26 MED ORDER — PANTOPRAZOLE SODIUM 40 MG PO TBEC: 40.0000 mg | DELAYED_RELEASE_TABLET | Freq: Every day | ORAL | 3 refills | Status: DC

## 2021-12-26 NOTE — Addendum Note (Signed)
Addended by: Lamar Blinks C on: 12/26/2021 07:47 PM   Modules accepted: Orders

## 2022-01-11 ENCOUNTER — Other Ambulatory Visit (HOSPITAL_COMMUNITY): Payer: Self-pay | Admitting: Physical Medicine and Rehabilitation

## 2022-01-11 DIAGNOSIS — M25512 Pain in left shoulder: Secondary | ICD-10-CM | POA: Diagnosis not present

## 2022-01-11 DIAGNOSIS — M5441 Lumbago with sciatica, right side: Secondary | ICD-10-CM

## 2022-01-12 ENCOUNTER — Other Ambulatory Visit: Payer: Self-pay | Admitting: Family Medicine

## 2022-01-12 DIAGNOSIS — J301 Allergic rhinitis due to pollen: Secondary | ICD-10-CM

## 2022-01-14 ENCOUNTER — Encounter: Payer: Self-pay | Admitting: Family Medicine

## 2022-02-15 ENCOUNTER — Ambulatory Visit: Payer: PPO | Attending: Internal Medicine | Admitting: Internal Medicine

## 2022-02-15 ENCOUNTER — Encounter: Payer: Self-pay | Admitting: Internal Medicine

## 2022-02-15 VITALS — BP 148/88 | HR 83 | Ht 60.0 in | Wt 160.2 lb

## 2022-02-15 DIAGNOSIS — I495 Sick sinus syndrome: Secondary | ICD-10-CM

## 2022-02-15 DIAGNOSIS — I10 Essential (primary) hypertension: Secondary | ICD-10-CM

## 2022-02-15 DIAGNOSIS — Z950 Presence of cardiac pacemaker: Secondary | ICD-10-CM | POA: Diagnosis not present

## 2022-02-15 NOTE — Progress Notes (Signed)
HPI Theresa Mejia returns today for ongoing evaluation of palpitations and chronotropic incompetence, s/p PPM insertion. She is a pleasant 82 yo woman who c/o fatigue, weakness and palpitations, and average HR of 48/min. She underwent PPM insertion several years ago. She denies chest pain. She has recently moved into an independent living facility. Allergies  Allergen Reactions   Ramipril     Unknown reaction   Sulfa Antibiotics     Says it dropped her blood pressure really low.    Sulfonamide Derivatives     REACTION: hypotension     Current Outpatient Medications  Medication Sig Dispense Refill   buPROPion (WELLBUTRIN XL) 150 MG 24 hr tablet Take 1 tablet (150 mg total) by mouth daily. Increase to 300 mg after 1 week if needed 60 tablet 5   calcium carbonate (TUMS - DOSED IN MG ELEMENTAL CALCIUM) 500 MG chewable tablet Chew 1 tablet by mouth 4 (four) times daily as needed for indigestion or heartburn.     clonazePAM (KLONOPIN) 0.5 MG tablet TAKE 1/2 AT BEDTIME AS NEEDED FOR INSOMNIA, MAY INCREASE TO A WHOLE TABLET IF NEEDED 30 tablet 1   D-5000 125 MCG (5000 UT) TABS SMARTSIG:1 By Mouth     denosumab (PROLIA) 60 MG/ML SOSY injection Inject 60 mg into the skin every 6 (six) months.     fluticasone (FLONASE) 50 MCG/ACT nasal spray SPRAY 2 SPRAYS INTO EACH NOSTRIL EVERY DAY 48 mL 3   levocetirizine (XYZAL) 5 MG tablet TAKE 1 TABLET BY MOUTH EVERY DAY IN THE EVENING 90 tablet 1   levothyroxine (SYNTHROID) 125 MCG tablet TAKE 1 TABLET (125 MCG TOTAL) BY MOUTH IN THE MORNING 90 tablet 0   metoprolol succinate (TOPROL-XL) 25 MG 24 hr tablet TAKE 1/2 TABLET BY MOUTH EVERY DAY 45 tablet 3   pantoprazole (PROTONIX) 40 MG tablet Take 1 tablet (40 mg total) by mouth daily. Take 30 minutes before eating. 90 tablet 3   polyethylene glycol (MIRALAX / GLYCOLAX) 17 g packet Take 17 g by mouth daily as needed.     Polyvinyl Alcohol-Povidone (REFRESH OP) Place 1 drop into both eyes daily as needed  (dry eyes).     simvastatin (ZOCOR) 20 MG tablet TAKE 1 TABLET BY MOUTH EVERYDAY AT BEDTIME 90 tablet 3   Wheat Dextrin (BENEFIBER) POWD Take 1-2 Scoops by mouth every morning. (Take 1 to 2 teaspoons each morning mixed with beverage of choice)     No current facility-administered medications for this visit.     Past Medical History:  Diagnosis Date   Allergic rhinitis    Depression    DJD (degenerative joint disease)    GERD (gastroesophageal reflux disease)    Headache(784.0)    sinus   Hypercholesteremia    Hypersomnia    Hypertension    Hypothyroidism    Internal hemorrhoid 11/2008   s/p banding ([facility])   Lumbar disc disease    OBSTRUCTIVE SLEEP APNEA    NPSG 04/29/07- AHI 1.1/hr, RDI 21.2/hr. Weight 176 lbs. CPAP was tried based on the RDI.     OVERACTIVE BLADDER    pt unaware   Presence of permanent cardiac pacemaker 11/11/2015   Reactive depression (situational)    Sinus node dysfunction (HCC)    Spondylosis, cervical     ROS:   All systems reviewed and negative except as noted in the HPI.   Past Surgical History:  Procedure Laterality Date   BIOPSY BREAST     BREAST EXCISIONAL BIOPSY  Left 2011   CATARACT EXTRACTION W/ INTRAOCULAR LENS IMPLANT Right    COLONOSCOPY     EP IMPLANTABLE DEVICE N/A 11/11/2015   Procedure: Pacemaker Implant;  Surgeon: Evans Lance, MD;  Location: [facility];  Service: Cardiovascular;  Laterality: N/A;   eye lid lift     INSERT / REPLACE / REMOVE PACEMAKER  11/11/2015   KNEE ARTHROSCOPY     Dr. [name]   MOUTH SURGERY  08/25/2017   implant fell out due to bone loss   REVERSE SHOULDER ARTHROPLASTY Right 08/08/2019   Procedure: REVERSE SHOULDER ARTHROPLASTY;  Surgeon: Tania Ade, MD;  Location: [facility];  Service: Orthopedics;  Laterality: Right;   rotator cuff surgery Right    Dr. [name]   TUBAL LIGATION       Family History  Problem Relation Age of Onset   Macular degeneration Mother    Hypothyroidism Mother     Tremor Mother    Stroke Mother 26   Crohn's disease Father 14   Alzheimer's disease Paternal Aunt    Alzheimer's disease Paternal Uncle    Parkinsonism Maternal Grandmother    Diabetes Paternal Grandmother    Colon cancer Neg Hx    Esophageal cancer Neg Hx    Rectal cancer Neg Hx      Social History   Socioeconomic History   Marital status: Married    Spouse name: Not on file   Number of children: 2   Years of education: Not on file   Highest education level: Not on file  Occupational History   Occupation: Solicitor radiology-retire    Employer: [employer]    Comment: data entry  Tobacco Use   Smoking status: Former    Years: 8.00    Types: Cigarettes    Quit date: 01/10/1966    Years since quitting: 56.1   Smokeless tobacco: Never  Vaping Use   Vaping Use: Never used  Substance and Sexual Activity   Alcohol use: No   Drug use: No   Sexual activity: Never    Birth control/protection: Post-menopausal  Other Topics Concern   Not on file  Social History Narrative   married, lives with spouse -   Retired 05/2013   Social Determinants of Health   Financial Resource Strain: Low Risk  (02/16/2021)   Overall Financial Resource Strain (CARDIA)    Difficulty of Paying Living Expenses: Not hard at all  Food Insecurity: No Food Insecurity (11/19/2021)   Hunger Vital Sign    Worried About Running Out of Food in the Last Year: Never true    Ran Out of Food in the Last Year: Never true  Transportation Needs: No Transportation Needs (11/19/2021)   PRAPARE - Hydrologist (Medical): No    Lack of Transportation (Non-Medical): No  Physical Activity: Insufficiently Active (04/06/2021)   Exercise Vital Sign    Days of Exercise per Week: 2 days    Minutes of Exercise per Session: 40 min  Stress: No Stress Concern Present (11/17/2020)   [place]    Feeling of Stress : Not at all   Social Connections: [place] (11/17/2020)   Social Connection and Isolation Panel [NHANES]    Frequency of Communication with Friends and Family: More than three times a week    Frequency of Social Gatherings with Friends and Family: More than three times a week    Attends Religious Services: More than 4 times  per year    Active Member of Clubs or Organizations: Yes    Attends Archivist Meetings: More than 4 times per year    Marital Status: Married  Human resources officer Violence: Not At Risk (11/17/2020)   Humiliation, Afraid, Rape, and Kick questionnaire    Fear of Current or Ex-Partner: No    Emotionally Abused: No    Physically Abused: No    Sexually Abused: No     BP (!) 148/88   Pulse 83   Ht 5' (1.524 m)   Wt 160 lb 3.2 oz (72.7 kg)   SpO2 96%   BMI 31.29 kg/m   Physical Exam:  Well appearing NAD HEENT: Unremarkable Neck:  No JVD, no thyromegally Lymphatics:  No adenopathy Back:  No CVA tenderness Lungs:  Clear with no wheezes HEART:  Regular rate rhythm, no murmurs, no rubs, no clicks Abd:  soft, positive bowel sounds, no organomegally, no rebound, no guarding Ext:  2 plus pulses, no edema, no cyanosis, no clubbing Skin:  No rashes no nodules Neuro:  CN II through XII intact, motor grossly intact  EKG - NSR with atrial pacing  DEVICE  Normal device function.  See PaceArt for details.   Assess/Plan: 1. Sinus node dysfunction - she is stable and asymptomatic, s/p PPM insertion. 2. PPM - her Biotronik DDD PM is working normally. Will recheck in several months. 3. HTN - her BP is fairly well controlled. No change.     Carleene Overlie Frederick Klinger,MD

## 2022-02-15 NOTE — Patient Instructions (Signed)
Medication Instructions:  Your physician recommends that you continue on your current medications as directed. Please refer to the Current Medication list given to you today.  *If you need a refill on your cardiac medications before your next appointment, please call your pharmacy*  Lab Work: None ordered.  If you have labs (blood work) drawn today and your tests are completely normal, you will receive your results only by: MyChart Message (if you have MyChart) OR A paper copy in the mail If you have any lab test that is abnormal or we need to change your treatment, we will call you to review the results.  Testing/Procedures: None ordered.  Follow-Up: At [facility], you and your health needs are our priority.  As part of our continuing mission to provide you with exceptional heart care, we have created designated Provider Care Teams.  These Care Teams include your primary Cardiologist (physician) and Advanced Practice Providers (APPs -  Physician Assistants and Nurse Practitioners) who all work together to provide you with the care you need, when you need it.  We recommend signing up for the patient portal called "MyChart".  Sign up information is provided on this After Visit Summary.  MyChart is used to connect with patients for Virtual Visits (Telemedicine).  Patients are able to view lab/test results, encounter notes, upcoming appointments, etc.  Non-urgent messages can be sent to your provider as well.   To learn more about what you can do with MyChart, go to [email or web address].    Your next appointment:   1 year(s)  The format for your next appointment:   In Person  Provider:   Gregg Taylor, MD{or one of the following Advanced Practice Providers on your designated Care Team:   Renee Ursuy, PA-C Michael "Andy" Tillery, PA-C  Remote monitoring is used to monitor your Pacemaker from home. This monitoring reduces the number of office visits required to check your device to  one time per year. It allows us to keep an eye on the functioning of your device to ensure it is working properly. You are scheduled for a device check from home on 02/16/22. You may send your transmission at any time that day. If you have a wireless device, the transmission will be sent automatically. After your physician reviews your transmission, you will receive a postcard with your next transmission date.  Important Information About Sugar       

## 2022-02-16 ENCOUNTER — Ambulatory Visit (INDEPENDENT_AMBULATORY_CARE_PROVIDER_SITE_OTHER): Payer: PPO

## 2022-02-16 DIAGNOSIS — I495 Sick sinus syndrome: Secondary | ICD-10-CM | POA: Diagnosis not present

## 2022-02-16 DIAGNOSIS — M19012 Primary osteoarthritis, left shoulder: Secondary | ICD-10-CM | POA: Diagnosis not present

## 2022-02-16 LAB — CUP PACEART REMOTE DEVICE CHECK
Date Time Interrogation Session: 20240207071722
Implantable Lead Connection Status: 753985
Implantable Lead Connection Status: 753985
Implantable Lead Implant Date: 20171101
Implantable Lead Implant Date: 20171101
Implantable Lead Location: 753859
Implantable Lead Location: 753860
Implantable Lead Model: 377
Implantable Lead Model: 377
Implantable Lead Serial Number: 49553678
Implantable Lead Serial Number: 49617393
Implantable Pulse Generator Implant Date: 20171101
Pulse Gen Model: 394969
Pulse Gen Serial Number: 68817609

## 2022-02-17 ENCOUNTER — Ambulatory Visit (HOSPITAL_COMMUNITY): Payer: PPO

## 2022-02-21 ENCOUNTER — Encounter: Payer: Self-pay | Admitting: Family Medicine

## 2022-02-21 ENCOUNTER — Other Ambulatory Visit (HOSPITAL_COMMUNITY): Payer: Self-pay

## 2022-02-21 ENCOUNTER — Ambulatory Visit (HOSPITAL_COMMUNITY)
Admission: RE | Admit: 2022-02-21 | Discharge: 2022-02-21 | Disposition: A | Discharge status: Home / Self Care | Payer: PPO | Source: Ambulatory Visit | Attending: Physical Medicine and Rehabilitation | Admitting: Physical Medicine and Rehabilitation

## 2022-02-21 DIAGNOSIS — M5136 Other intervertebral disc degeneration, lumbar region: Secondary | ICD-10-CM | POA: Diagnosis not present

## 2022-02-21 DIAGNOSIS — M5441 Lumbago with sciatica, right side: Secondary | ICD-10-CM | POA: Diagnosis not present

## 2022-02-21 DIAGNOSIS — M5126 Other intervertebral disc displacement, lumbar region: Secondary | ICD-10-CM | POA: Diagnosis not present

## 2022-02-21 DIAGNOSIS — M48061 Spinal stenosis, lumbar region without neurogenic claudication: Secondary | ICD-10-CM | POA: Diagnosis not present

## 2022-02-21 NOTE — Telephone Encounter (Signed)
Pharmacy Patient Advocate Encounter  Insurance verification completed.    The patient is insured through [employer]   Ran test claims for: Prolia 60mg.  Pharmacy benefit copay: $200.00  

## 2022-02-21 NOTE — Telephone Encounter (Signed)
Prolia VOB initiated via [email or web address] 

## 2022-02-23 ENCOUNTER — Encounter: Payer: Self-pay | Admitting: Family Medicine

## 2022-02-23 ENCOUNTER — Other Ambulatory Visit (HOSPITAL_COMMUNITY): Payer: Self-pay

## 2022-02-23 NOTE — Telephone Encounter (Signed)
Pt ready for scheduling for Prolia on or after : 03/23/22  Out-of-pocket cost due at time of visit: $302  Primary:  Prolia co-insurance: 20% Admin fee co-insurance:   Secondary: N/A Prolia co-insurance:  Admin fee co-insurance:   Medical Benefit Details: Date Benefits were checked: 02/23/22 Deductible: $0/ Coinsurance: 20%/ Admin Fee: $0  Prior Auth: N/A PA# Expiration Date:    Pharmacy benefit: Copay $200.00 If patient wants fill through the pharmacy benefit please send prescription to:  [facility] , and include estimated need by date in rx notes. Pharmacy will ship medication directly to the office.  Patient not eligible for Prolia Copay Card. Copay Card can make patient's cost as little as $25. Link to apply: [email or web address]://[email or web address]  ** This summary of benefits is an estimation of the patient's out-of-pocket cost. Exact cost may very based on individual plan coverage.

## 2022-03-07 ENCOUNTER — Other Ambulatory Visit: Payer: Self-pay | Admitting: Internal Medicine

## 2022-03-08 DIAGNOSIS — M545 Low back pain, unspecified: Secondary | ICD-10-CM | POA: Diagnosis not present

## 2022-03-15 NOTE — Progress Notes (Signed)
Remote pacemaker transmission.   

## 2022-03-21 DIAGNOSIS — M19012 Primary osteoarthritis, left shoulder: Secondary | ICD-10-CM | POA: Diagnosis not present

## 2022-03-24 ENCOUNTER — Ambulatory Visit (INDEPENDENT_AMBULATORY_CARE_PROVIDER_SITE_OTHER): Payer: PPO

## 2022-03-24 DIAGNOSIS — M810 Age-related osteoporosis without current pathological fracture: Secondary | ICD-10-CM

## 2022-03-24 MED ORDER — DENOSUMAB 60 MG/ML ~~LOC~~ SOSY
60.0000 mg | PREFILLED_SYRINGE | Freq: Once | SUBCUTANEOUS | Status: AC
  Administered 2022-03-24: 60 mg via SUBCUTANEOUS

## 2022-03-24 NOTE — Progress Notes (Signed)
Theresa Mejia is a 82 y.o. female presents to the office today for Prolia injections, per physician's orders. Original order: provider messaged patient 02/23/22 to schedule. prolia (med), 60 mg/ml (dose),  SQ (route) was administered left arm (location) today. Patient tolerated injection.  Patient next injection due: in 6 months, appt will be made after benefits verification.   Jiles Prows

## 2022-04-01 DIAGNOSIS — H52202 Unspecified astigmatism, left eye: Secondary | ICD-10-CM | POA: Diagnosis not present

## 2022-04-01 DIAGNOSIS — H2512 Age-related nuclear cataract, left eye: Secondary | ICD-10-CM | POA: Diagnosis not present

## 2022-04-01 DIAGNOSIS — Z961 Presence of intraocular lens: Secondary | ICD-10-CM | POA: Diagnosis not present

## 2022-04-25 ENCOUNTER — Other Ambulatory Visit: Payer: Self-pay | Admitting: Family Medicine

## 2022-04-25 DIAGNOSIS — F5101 Primary insomnia: Secondary | ICD-10-CM

## 2022-05-15 NOTE — Patient Instructions (Incomplete)
It was good to see you again today! We will get labs and I will be in touch with results  I will touch base with Dr Ladona Ridgel about anything further he would like to do as far as your heart Referral to neurology to discuss memory, tremor, sleep  Let's taper off wellbutrin- take 1 tablet daily for about a week, then take one every other day for 2-3 doses.   Then you can stop the wellbutrin and start on venlafaxine- start with 75 mg and we can go up as needed   Let me know how this works for you  Let me know if you would ike a referral to a new GI doc

## 2022-05-15 NOTE — Progress Notes (Unsigned)
[facility] [address] [place], [place] [address] [phone]  Date:  05/18/2022   Name:  Theresa Mejia   DOB:  22-Feb-1940   MRN:  [id number]  PCP:  Pearline Cables, MD    Chief Complaint: Sleeping Issues + Heartburn + Memory Issues (Concerns/ questions: pt says this feels more like regular heartburn, she feels it more in her chest. 2. She would like to discuss her Wellbutrin and its effectiveness. )   History of Present Illness:  Theresa Mejia is a 82 y.o. very pleasant female patient who presents with the following:  Patient seen today with concern of difficulty sleeping, fatigue and possible memory loss as well as increased depression. History of pacemaker due to sinus node dysfunction, osteopenia, lumbar spine disease, sleep apnea, hyperlipidemia, hypothyroidism, hypertension Most recent visit with myself was in November-at that time she was having hip pain, feeling down and tired.  We switched her from fluoxetine to Wellbutrin and sent her for physical therapy  Theresa Mejia is concerned about her memory, and her family has noted some problems too  Pt notes her father and his twin both had Alzheimer dementia - the other 7 sibs in the family also had dementia as well as many other family members.  MGM had Parkinson's disease. Her mother however lived to 64 with no major issues with her memory, etc She did get lost in a familiar place- going to her synagogue which is very familiar to her recently.  This frightened her  Theresa Mejia has seen Vic Blackbird- psychologist a couple of times for depression, and she also mentioned concern about her memory   In addition she has noted development of a tremor in her hands-it does not interfere with her function but is noticeable  She did use CPAP for a year or so in the past but stopped using it She will get "heartburn" sx most days.  Her symptoms are typically better with Protonix, if  she occasionally misses her dose her symptoms will be worse   Tums does help with residual symptoms She describes her GERD as a gnawing feeling in her chest and epigastric area.  It does not get worse with exertion or follow any particular pattern during the day It is not bothering her currently She estimates worsening of the symptoms for 2 or 3 months.  Wellbutrin XR 150-2 daily Clonazepam as needed Vitamin D Prolia Levothyroxine 125 Toprol-XL Pantoprazole Simvastatin  Labs in November-CMP, CBC, vitamin D, A1c 5.7%, TSH Patient Active Problem List   Diagnosis Date Noted   Lumbar radiculopathy 12/06/2020   Pain of left hip joint 11/11/2020   Pacemaker 08/25/2020   Osteoarthritis of carpometacarpal (CMC) joint of thumb 01/16/2019   Pain in right hand 01/16/2019   Trigger finger of right hand 01/16/2019   Osteopenia 01/09/2019   Pansinusitis 03/06/2018   Orthostatic hypotension 12/06/2016   Sinus node dysfunction (HCC) 11/11/2015   Routine general medical examination at a health care facility 06/04/2015   Insomnia with sleep apnea 06/04/2015   Bronchitis, chronic obstructive w acute bronchitis (HCC) 12/10/2014   Lumbar disc disease 09/01/2010   Obstructive sleep apnea 05/31/2007   Hyperlipidemia with target LDL less than 130 02/05/2007   Depression with anxiety 02/05/2007   Allergic rhinitis 02/05/2007   Hypothyroidism 02/02/2007   Essential hypertension 02/02/2007    Past Medical History:  Diagnosis Date   Allergic rhinitis    Depression  DJD (degenerative joint disease)    GERD (gastroesophageal reflux disease)    Headache(784.0)    sinus   Hypercholesteremia    Hypersomnia    Hypertension    Hypothyroidism    Internal hemorrhoid 11/2008   s/p banding ([facility])   Lumbar disc disease    OBSTRUCTIVE SLEEP APNEA    NPSG 04/29/07- AHI 1.1/hr, RDI 21.2/hr. Weight 176 lbs. CPAP was tried based on the RDI.     OVERACTIVE BLADDER    pt unaware   Presence of  permanent cardiac pacemaker 11/11/2015   Reactive depression (situational)    Sinus node dysfunction (HCC)    Spondylosis, cervical     Past Surgical History:  Procedure Laterality Date   BIOPSY BREAST     BREAST EXCISIONAL BIOPSY Left 2011   CATARACT EXTRACTION W/ INTRAOCULAR LENS IMPLANT Right    COLONOSCOPY     EP IMPLANTABLE DEVICE N/A 11/11/2015   Procedure: Pacemaker Implant;  Surgeon: Marinus Maw, MD;  Location: [facility];  Service: Cardiovascular;  Laterality: N/A;   eye lid lift     INSERT / REPLACE / REMOVE PACEMAKER  11/11/2015   KNEE ARTHROSCOPY     Dr. Despina Hick   MOUTH SURGERY  08/25/2017   implant fell out due to bone loss   REVERSE SHOULDER ARTHROPLASTY Right 08/08/2019   Procedure: REVERSE SHOULDER ARTHROPLASTY;  Surgeon: Jones Broom, MD;  Location: [facility];  Service: Orthopedics;  Laterality: Right;   rotator cuff surgery Right    Dr. Teressa Senter   TUBAL LIGATION      Social History   Tobacco Use   Smoking status: Former    Years: 8    Types: Cigarettes    Quit date: 01/10/1966    Years since quitting: 56.3   Smokeless tobacco: Never  Vaping Use   Vaping Use: Never used  Substance Use Topics   Alcohol use: No   Drug use: No    Family History  Problem Relation Age of Onset   Macular degeneration Mother    Hypothyroidism Mother    Tremor Mother    Stroke Mother 19   Crohn's disease Father 61   Alzheimer's disease Paternal Aunt    Alzheimer's disease Paternal Uncle    Parkinsonism Maternal Grandmother    Diabetes Paternal Grandmother    Colon cancer Neg Hx    Esophageal cancer Neg Hx    Rectal cancer Neg Hx     Allergies  Allergen Reactions   Ramipril     Unknown reaction   Sulfa Antibiotics     Says it dropped her blood pressure really low.    Sulfonamide Derivatives     REACTION: hypotension    Medication list has been reviewed and updated.  Current Outpatient Medications on File Prior to Visit  Medication Sig Dispense  Refill   buPROPion (WELLBUTRIN XL) 150 MG 24 hr tablet Take 1 tablet (150 mg total) by mouth daily. Increase to 300 mg after 1 week if needed 60 tablet 5   calcium carbonate (TUMS - DOSED IN MG ELEMENTAL CALCIUM) 500 MG chewable tablet Chew 1 tablet by mouth 4 (four) times daily as needed for indigestion or heartburn.     clonazePAM (KLONOPIN) 0.5 MG tablet TAKE 1/2 AT BEDTIME AS NEEDED FOR INSOMNIA, MAY INCREASE TO A WHOLE TABLET IF NEEDED 30 tablet 0   D-5000 125 MCG (5000 UT) TABS SMARTSIG:1 By Mouth     denosumab (PROLIA) 60 MG/ML SOSY injection Inject 60 mg into  the skin every 6 (six) months.     fluticasone (FLONASE) 50 MCG/ACT nasal spray SPRAY 2 SPRAYS INTO EACH NOSTRIL EVERY DAY 48 mL 3   levocetirizine (XYZAL) 5 MG tablet TAKE 1 TABLET BY MOUTH EVERY DAY IN THE EVENING 90 tablet 1   levothyroxine (SYNTHROID) 125 MCG tablet TAKE 1 TABLET (125 MCG TOTAL) BY MOUTH IN THE MORNING 90 tablet 0   metoprolol succinate (TOPROL-XL) 25 MG 24 hr tablet TAKE 1/2 TABLET BY MOUTH EVERY DAY 45 tablet 3   pantoprazole (PROTONIX) 40 MG tablet Take 1 tablet (40 mg total) by mouth daily. Take 30 minutes before eating. 90 tablet 3   polyethylene glycol (MIRALAX / GLYCOLAX) 17 g packet Take 17 g by mouth daily as needed.     Polyvinyl Alcohol-Povidone (REFRESH OP) Place 1 drop into both eyes daily as needed (dry eyes).     simvastatin (ZOCOR) 20 MG tablet TAKE 1 TABLET BY MOUTH EVERYDAY AT BEDTIME 90 tablet 3   Wheat Dextrin (BENEFIBER) POWD Take 1-2 Scoops by mouth every morning. (Take 1 to 2 teaspoons each morning mixed with beverage of choice)     No current facility-administered medications on file prior to visit.    Review of Systems:  As per HPI- otherwise negative.   Physical Examination: Vitals:   05/18/22 1014  BP: 126/80  Pulse: 76  Resp: 18  Temp: 98 F (36.7 C)  SpO2: 95%   Vitals:   05/18/22 1014  Weight: 161 lb (73 kg)  Height: 5' (1.524 m)   Body mass index is 31.44  kg/m. Ideal Body Weight: Weight in (lb) to have BMI = 25: 127.7  GEN: no acute distress.  Mildly obese, looks well HEENT: Atraumatic, Normocephalic.  Ears and Nose: No external deformity. CV: RRR, No M/G/R. No JVD. No thrill. No extra heart sounds. PULM: CTA B, no wheezes, crackles, rhonchi. No retractions. No resp. distress. No accessory muscle use. ABD: S, NT, ND, +BS. No rebound. No HSM. EXTR: No c/c/e PSYCH: Normally interactive. Conversant.  Minimal tremor in both hands, likely essential tremor  EKG: Sinus rhythm, compared with tracing from February no significant changes noted  Assessment and Plan: Osteoporosis, post-menopausal  Gastroesophageal reflux disease, unspecified whether esophagitis present  Other specified hypothyroidism - Plan: TSH  Essential hypertension - Plan: Comprehensive metabolic panel  Chronic fatigue  Major depression, chronic - Plan: venlafaxine XR (EFFEXOR XR) 75 MG 24 hr capsule  Elevated glucose - Plan: Hemoglobin A1c  Hyperlipidemia with target LDL less than 130 - Plan: Lipid panel  Memory loss - Plan: B12, RPR, Ambulatory referral to Neurology  Epigastric pain - Plan: EKG 12-Lead  Patient seen today with a few concerns Osteoporosis is being treated with Prolia She takes a daily PPI for GERD symptoms, she does note some residual symptoms.  I explained that chronic reflux can cause damage to the esophagus, offered to refer her to gastroenterology.  She declines for the time being.  Obtained EKG, no concerning change from previous-however, I will touch base with her cardiologist and see if he wants to bring her in for follow-up as her last stress test was in 2017  Follow-up on hypothyroidism today-currently taking 125 mcg levothyroxine We discussed her fatigue.  We have looked into this in the past and has not found a definite explanation.  I wonder if this could have to do with untreated sleep apnea.  We plan a neurology referral to discuss  memory concerns, tremor and they can also  likely evaluate need to revisit CPAP treatment I spoke with Theresa Mejia for quite some time today, I did not notice any lapses in her memory in fact she seemed able to recall fairly distant information about her health easily.  However, she herself as well as her friends and family have been concerned and she did have an episode of getting lost driving to a familiar location.  I will check a B12, RPR, refer to neurology  Will have her taper off Wellbutrin and start venlafaxine instead.  She will let me know how this works for her  Signed Abbe Amsterdam, MD  Received labs as below, message to patient  Results for orders placed or performed in visit on 05/18/22  Lipid panel  Result Value Ref Range   Cholesterol 132 0 - 200 mg/dL   Triglycerides 69.6 0.0 - 149.0 mg/dL   HDL 29.52 >84.13 mg/dL   VLDL 24.4 0.0 - 01.0 mg/dL   LDL Cholesterol 74 0 - 99 mg/dL   Total CHOL/HDL Ratio 3    NonHDL 92.33   TSH  Result Value Ref Range   TSH 0.01 Repeated and verified X2. (L) 0.35 - 5.50 uIU/mL  Hemoglobin A1c  Result Value Ref Range   Hgb A1c MFr Bld 5.6 4.6 - 6.5 %  B12  Result Value Ref Range   Vitamin B-12 858 211 - 911 pg/mL  Comprehensive metabolic panel  Result Value Ref Range   Sodium 142 135 - 145 mEq/L   Potassium 4.3 3.5 - 5.1 mEq/L   Chloride 105 96 - 112 mEq/L   CO2 29 19 - 32 mEq/L   Glucose, Bld 87 70 - 99 mg/dL   BUN 23 6 - 23 mg/dL   Creatinine, Ser 2.72 0.40 - 1.20 mg/dL   Total Bilirubin 0.4 0.2 - 1.2 mg/dL   Alkaline Phosphatase 31 (L) 39 - 117 U/L   AST 16 0 - 37 U/L   ALT 13 0 - 35 U/L   Total Protein 6.7 6.0 - 8.3 g/dL   Albumin 4.4 3.5 - 5.2 g/dL   GFR 53.66 >44.03 mL/min   Calcium 9.2 8.4 - 10.5 mg/dL

## 2022-05-18 ENCOUNTER — Ambulatory Visit (INDEPENDENT_AMBULATORY_CARE_PROVIDER_SITE_OTHER): Payer: PPO

## 2022-05-18 ENCOUNTER — Encounter: Payer: Self-pay | Admitting: Family Medicine

## 2022-05-18 ENCOUNTER — Ambulatory Visit (INDEPENDENT_AMBULATORY_CARE_PROVIDER_SITE_OTHER): Payer: PPO | Admitting: Family Medicine

## 2022-05-18 VITALS — BP 126/80 | HR 76 | Temp 98.0°F | Resp 18 | Ht 60.0 in | Wt 161.0 lb

## 2022-05-18 DIAGNOSIS — E038 Other specified hypothyroidism: Secondary | ICD-10-CM

## 2022-05-18 DIAGNOSIS — R5382 Chronic fatigue, unspecified: Secondary | ICD-10-CM | POA: Diagnosis not present

## 2022-05-18 DIAGNOSIS — E785 Hyperlipidemia, unspecified: Secondary | ICD-10-CM

## 2022-05-18 DIAGNOSIS — M810 Age-related osteoporosis without current pathological fracture: Secondary | ICD-10-CM | POA: Diagnosis not present

## 2022-05-18 DIAGNOSIS — I10 Essential (primary) hypertension: Secondary | ICD-10-CM | POA: Diagnosis not present

## 2022-05-18 DIAGNOSIS — K219 Gastro-esophageal reflux disease without esophagitis: Secondary | ICD-10-CM

## 2022-05-18 DIAGNOSIS — R1013 Epigastric pain: Secondary | ICD-10-CM | POA: Diagnosis not present

## 2022-05-18 DIAGNOSIS — F329 Major depressive disorder, single episode, unspecified: Secondary | ICD-10-CM | POA: Diagnosis not present

## 2022-05-18 DIAGNOSIS — R413 Other amnesia: Secondary | ICD-10-CM | POA: Diagnosis not present

## 2022-05-18 DIAGNOSIS — I495 Sick sinus syndrome: Secondary | ICD-10-CM | POA: Diagnosis not present

## 2022-05-18 DIAGNOSIS — R7309 Other abnormal glucose: Secondary | ICD-10-CM | POA: Diagnosis not present

## 2022-05-18 LAB — COMPREHENSIVE METABOLIC PANEL
ALT: 13 U/L (ref 0–35)
AST: 16 U/L (ref 0–37)
Albumin: 4.4 g/dL (ref 3.5–5.2)
Alkaline Phosphatase: 31 U/L — ABNORMAL LOW (ref 39–117)
BUN: 23 mg/dL (ref 6–23)
CO2: 29 mEq/L (ref 19–32)
Calcium: 9.2 mg/dL (ref 8.4–10.5)
Chloride: 105 mEq/L (ref 96–112)
Creatinine, Ser: 0.76 mg/dL (ref 0.40–1.20)
GFR: 74.55 mL/min (ref >60.00)
Glucose, Bld: 87 mg/dL (ref 70–99)
Potassium: 4.3 mEq/L (ref 3.5–5.1)
Sodium: 142 mEq/L (ref 135–145)
Total Bilirubin: 0.4 mg/dL (ref 0.2–1.2)
Total Protein: 6.7 g/dL (ref 6.0–8.3)

## 2022-05-18 LAB — LIPID PANEL
Cholesterol: 132 mg/dL (ref 0–200)
HDL: 39.4 mg/dL (ref >39.00)
LDL Cholesterol: 74 mg/dL (ref 0–99)
NonHDL: 92.33
Total CHOL/HDL Ratio: 3
Triglycerides: 92 mg/dL (ref 0.0–149.0)
VLDL: 18.4 mg/dL (ref 0.0–40.0)

## 2022-05-18 LAB — HEMOGLOBIN A1C: Hgb A1c MFr Bld: 5.6 % (ref 4.6–6.5)

## 2022-05-18 LAB — TSH: TSH: 0.01 u[IU]/mL — ABNORMAL LOW (ref 0.35–5.50)

## 2022-05-18 LAB — VITAMIN B12: Vitamin B-12: 858 pg/mL (ref 211–911)

## 2022-05-18 MED ORDER — VENLAFAXINE HCL ER 75 MG PO CP24: 75.0000 mg | ORAL_CAPSULE | Freq: Every day | ORAL | 3 refills | Status: DC

## 2022-05-19 ENCOUNTER — Other Ambulatory Visit: Payer: Self-pay | Admitting: Family Medicine

## 2022-05-19 DIAGNOSIS — E038 Other specified hypothyroidism: Secondary | ICD-10-CM

## 2022-05-19 LAB — CUP PACEART REMOTE DEVICE CHECK
Battery Remaining Percentage: 55 %
Brady Statistic RA Percent Paced: 100 %
Brady Statistic RV Percent Paced: 0 %
Date Time Interrogation Session: 20240508083753
Implantable Lead Connection Status: 753985
Implantable Lead Connection Status: 753985
Implantable Lead Implant Date: 20171101
Implantable Lead Implant Date: 20171101
Implantable Lead Location: 753859
Implantable Lead Location: 753860
Implantable Lead Model: 377
Implantable Lead Model: 377
Implantable Lead Serial Number: 49553678
Implantable Lead Serial Number: 49617393
Implantable Pulse Generator Implant Date: 20171101
Lead Channel Impedance Value: 488 Ohm
Lead Channel Impedance Value: 488 Ohm
Lead Channel Pacing Threshold Amplitude: 0.6 V
Lead Channel Pacing Threshold Amplitude: 0.7 V
Lead Channel Pacing Threshold Pulse Width: 0.4 ms
Lead Channel Pacing Threshold Pulse Width: 0.4 ms
Lead Channel Sensing Intrinsic Amplitude: 11.2 mV
Lead Channel Sensing Intrinsic Amplitude: 2.5 mV
Lead Channel Setting Pacing Amplitude: 2 V
Lead Channel Setting Pacing Amplitude: 2 V
Lead Channel Setting Pacing Pulse Width: 0.4 ms
Pulse Gen Model: 394969
Pulse Gen Serial Number: 68817609

## 2022-05-19 LAB — RPR: RPR Ser Ql: NONREACTIVE

## 2022-05-20 ENCOUNTER — Encounter: Payer: Self-pay | Admitting: Family Medicine

## 2022-05-20 DIAGNOSIS — R079 Chest pain, unspecified: Secondary | ICD-10-CM

## 2022-05-23 ENCOUNTER — Encounter (HOSPITAL_COMMUNITY): Payer: Self-pay | Admitting: Family Medicine

## 2022-05-25 ENCOUNTER — Telehealth (HOSPITAL_COMMUNITY): Payer: Self-pay | Admitting: *Deleted

## 2022-05-25 NOTE — Telephone Encounter (Signed)
Pt reached and given instructions for MPI 

## 2022-05-26 ENCOUNTER — Ambulatory Visit (HOSPITAL_COMMUNITY): Payer: PPO | Attending: Cardiovascular Disease

## 2022-05-26 ENCOUNTER — Encounter: Payer: Self-pay | Admitting: Family Medicine

## 2022-05-26 DIAGNOSIS — R079 Chest pain, unspecified: Secondary | ICD-10-CM | POA: Insufficient documentation

## 2022-05-26 LAB — MYOCARDIAL PERFUSION IMAGING
LV dias vol: 53 mL (ref 46–106)
LV sys vol: 18 mL
Nuc Stress EF: 65 %
Peak HR: 90 {beats}/min
Rest HR: 75 {beats}/min
Rest Nuclear Isotope Dose: 10.1 mCi
SDS: 1
SRS: 0
SSS: 1
ST Depression (mm): 0 mm
Stress Nuclear Isotope Dose: 31 mCi
TID: 0.97

## 2022-05-26 MED ORDER — TECHNETIUM TC 99M TETROFOSMIN IV KIT
10.1000 | PACK | Freq: Once | INTRAVENOUS | Status: AC | PRN
  Administered 2022-05-26: 10.1 via INTRAVENOUS

## 2022-05-26 MED ORDER — TECHNETIUM TC 99M TETROFOSMIN IV KIT
31.0000 | PACK | Freq: Once | INTRAVENOUS | Status: AC | PRN
  Administered 2022-05-26: 31 via INTRAVENOUS

## 2022-05-26 MED ORDER — REGADENOSON 0.4 MG/5ML IV SOLN
0.4000 mg | Freq: Once | INTRAVENOUS | Status: AC
  Administered 2022-05-26: 0.4 mg via INTRAVENOUS

## 2022-05-31 DIAGNOSIS — D2272 Melanocytic nevi of left lower limb, including hip: Secondary | ICD-10-CM | POA: Diagnosis not present

## 2022-05-31 DIAGNOSIS — L821 Other seborrheic keratosis: Secondary | ICD-10-CM | POA: Diagnosis not present

## 2022-05-31 DIAGNOSIS — D225 Melanocytic nevi of trunk: Secondary | ICD-10-CM | POA: Diagnosis not present

## 2022-05-31 DIAGNOSIS — L918 Other hypertrophic disorders of the skin: Secondary | ICD-10-CM | POA: Diagnosis not present

## 2022-05-31 DIAGNOSIS — D2271 Melanocytic nevi of right lower limb, including hip: Secondary | ICD-10-CM | POA: Diagnosis not present

## 2022-05-31 DIAGNOSIS — L720 Epidermal cyst: Secondary | ICD-10-CM | POA: Diagnosis not present

## 2022-06-06 ENCOUNTER — Other Ambulatory Visit: Payer: Self-pay | Admitting: Family Medicine

## 2022-06-08 NOTE — Progress Notes (Signed)
Remote pacemaker transmission.   

## 2022-06-16 ENCOUNTER — Other Ambulatory Visit: Payer: Self-pay | Admitting: Family Medicine

## 2022-06-18 ENCOUNTER — Other Ambulatory Visit: Payer: Self-pay | Admitting: Family Medicine

## 2022-06-18 DIAGNOSIS — E785 Hyperlipidemia, unspecified: Secondary | ICD-10-CM

## 2022-06-22 ENCOUNTER — Encounter: Payer: Self-pay | Admitting: Neurology

## 2022-06-22 ENCOUNTER — Other Ambulatory Visit: Payer: Self-pay | Admitting: Neurology

## 2022-06-22 ENCOUNTER — Ambulatory Visit: Payer: PPO | Admitting: Neurology

## 2022-06-22 VITALS — BP 132/84 | HR 82 | Ht 60.0 in | Wt 159.0 lb

## 2022-06-22 DIAGNOSIS — F0390 Unspecified dementia without behavioral disturbance: Secondary | ICD-10-CM | POA: Diagnosis not present

## 2022-06-22 DIAGNOSIS — R413 Other amnesia: Secondary | ICD-10-CM

## 2022-06-22 DIAGNOSIS — Z820 Family history of epilepsy and other diseases of the nervous system: Secondary | ICD-10-CM | POA: Diagnosis not present

## 2022-06-22 DIAGNOSIS — G309 Alzheimer's disease, unspecified: Secondary | ICD-10-CM

## 2022-06-22 NOTE — Patient Instructions (Signed)
Amyloid Beta Protein Precursor Test Why am I having this test? The amyloid beta protein precursor test can be used to help diagnose Alzheimer's disease (AD) or certain other types of dementia. A health care provider may order this test if you or someone you care for has symptoms of dementia, which include: Memory loss. Behavioral changes. Decreased ability to perform daily functions. This test may be used along with a thorough exam and other methods to help determine the cause of the symptoms. The other methods may include cognitive tests to assess memory or scanning tests of the brain to look for problems. What is being tested? This test measures the levels of the beta amyloid protein in the fluid surrounding the brain and spinal cord (cerebrospinal fluid, or CSF). Free beta amyloid proteins in the CSF have been shown to protect the brain. Levels of beta amyloid protein are decreased in the CSF of people with AD. What kind of sample is taken?  Samples of cerebrospinal fluid are required for this test. The samples are collected using a procedure in which a small needle is inserted into your lower back while you lie on your side. The needle is used to draw fluid from an area around the spinal cord. This procedure is called a lumbar puncture, or a spinal tap. The procedure is usually done in a hospital or clinic. The tests are run on the fluid obtained from the lumbar puncture. How are the results reported? Your test results will be reported as values. Your health care provider will compare your results to normal ranges that were established after testing a large group of people (reference values). Reference values may vary among labs and hospitals. For this test, a common reference value of beta amyloid protein is: Greater than 450 units/L. What do the results mean? Decreased levels of beta amyloid proteins in the CSF may indicate: Alzheimer's disease. Other forms of dementia. Talk with your health  care provider about what your results mean. Questions to ask your health care provider Ask your health care provider, or the department that is doing the test: When will my results be ready? How will I get my results? What are my treatment options? What other tests do I need? What are my next steps? Summary This test can be used to help diagnose Alzheimer's disease (AD) and certain other types of dementia. Cerebrospinal fluid (CSF) samples are collected using a lumbar puncture procedure, and the sample is tested to measure the level of beta amyloid protein. Levels of beta amyloid protein are decreased in the CSF of people with AD. This information is not intended to replace advice given to you by your health care provider. Make sure you discuss any questions you have with your health care provider. Document Revised: 05/13/2019 Document Reviewed: 05/13/2019 Elsevier Patient Education  2024 [employer].

## 2022-06-22 NOTE — Addendum Note (Signed)
Addended by: Judi Cong on: 06/22/2022 11:25 AM   Modules accepted: Orders

## 2022-06-22 NOTE — Progress Notes (Signed)
[facility]  Provider:  Dr Vickey Huger Referring Provider: Patsy Lager Gwenlyn Found, MD Primary Care Physician:  Pearline Cables, MD  Chief Complaint  Patient presents with   New Patient (Initial Visit)    Rm 1, pt alone, here today for new onset memory loss concerns. Pt moved about 5 miles from where they lived so in the same area and has noted some memory decline over the last year. She states that its both short term and long term memory loss. States problems with getting to places she normally would not. Her Dad had 8 other siblings and our of the 9 7 had dementia or alzheimers.   Other    Pt states that she was using CPAP and used it for a year. She stopped using it and turned it back in.     HPI:  Theresa Mejia is a 82 y.o. female and seen here upon referral from Dr. Patsy Lager for a new problem Consultation/ Evaluation of MEMORY LOSS.  This patient reports onset of memory problems  over a period of 24 months . She was last seen in 2020 for a sleep consultation, and had Insomnia and OSA, tried CAP for a year and felt no better ,  just making life more complicated. She moved to [place] and has been there with her husband for about one year- she has since lost her way to synagogue or back home, and her new residence is only blocks away form her old place on [address], and should be familiar.  She also stated she cannot recall seeing me in that sleep consultation visit in 2020.  She had a shoulder replacement and cannot recall the name of her surgeon, and she has no trouble with recall of appointments.    03-07-2018 Theresa Mejia is a 82 y.o. female patient , seen here as in a referral  from Dr. Patsy Lager for a new evaluation of possible sleep apnea.    Theresa Mejia has not felt rested and refreshed for 2 decades at least.  She has many times mentioning this to primary care and specialty care physicians.  She has also heard that there are many  new treatments out should she be diagnosed with obstructive sleep apnea. Mrs. Galecki reports that over 20 years ago she had a sleep study under the guidance of Dr. Jetty Duhamel who found borderline apnea and gave her the option to try CPAP.  She felt that CPAP use rather cumbersome it did not hurt her but it did not change her fragmented sleep or her non-restful sleep pattern.  She discontinued after a while.  In the interval of 20 years she has developed other co-morbidities:  She now has a pacemaker, due to bradycardia and near syncope. She carries a diagnosis of hypothyroidism, was treated by Dr. [name] at the time.    Sleep habits are as follows: dinner time 6 PM- dinner without alcohol, bedtime is 9 PM. She no longer shares the room with her husband, she sleeps in the guest room- she  reads in bed, the room is cool and quiet- but not dark- she has a lot of dormant electronics in the bedroom.sleeps on her side, wakes up prone. Bed is raised by 12 degrees,1 pillow. Nocturia times 1-2. Dreams often but doesn't remember the dreams.  Not longer snoring- wakes up " fine' but fatigues quickly,    Sleep medical history; frequent sinuitis. fatigue, non restful, non refreshed.  Family sleep history: 2 bothers , none of them has apnea. One is 9 years-younger, Rabbi and fully active, no memory concerns. Middle Brother lives in [place] and ultra-orthodox, no contact.      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  2 adult children, 2 grandchildren. 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 ETOH socially, Caffeine: 50/50 coffee in AM and one cup at lunch, 1 soda a day.    Review of Systems: Out of a complete 14 system review, the patient complains of only the following symptoms, and all other reviewed systems are negative.  Memory loss, disorientation  after moving to new residence, close to old address.  Mr. Dekira Cowart.    Dry mouth.  Epworth score : 7/ 24 points   , Fatigue  severity score; 42/ 63 (!)  , depression score 4/ 15   Social History   Socioeconomic History   Marital status: Married    Spouse name: Not on file   Number of children: 2   Years of education: Not on file   Highest education level: Not on file  Occupational History   Occupation: Armed forces operational officer radiology-retire    Employer: [employer]    Comment: data entry  Tobacco Use   Smoking status: Former    Years: 8    Types: Cigarettes    Quit date: 01/10/1966    Years since quitting: 56.4   Smokeless tobacco: Never  Vaping Use   Vaping Use: Never used  Substance and Sexual Activity   Alcohol use: No   Drug use: No   Sexual activity: Never    Birth control/protection: Post-menopausal  Other Topics Concern   Not on file  Social History Narrative   married, lives with spouse -   Retired 05/2013   Social Determinants of Health   Financial Resource Strain: Low Risk  (02/16/2021)   Overall Financial Resource Strain (CARDIA)    Difficulty of Paying Living Expenses: Not hard at all  Food Insecurity: No Food Insecurity (11/19/2021)   Hunger Vital Sign    Worried About Running Out of Food in the Last Year: Never true    Ran Out of Food in the Last Year: Never true  Transportation Needs: No Transportation Needs (11/19/2021)   PRAPARE - Administrator, Civil Service (Medical): No    Lack of Transportation (Non-Medical): No  Physical Activity: Insufficiently Active (04/06/2021)   Exercise Vital Sign    Days of Exercise per Week: 2 days    Minutes of Exercise per Session: 40 min  Stress: No Stress Concern Present (11/17/2020)   [employer] of Occupational Health - Occupational Stress Questionnaire    Feeling of Stress : Not at all  Social Connections: Socially Integrated (11/17/2020)   Social Connection and Isolation Panel [NHANES]    Frequency of Communication with Friends and Family: More than three times a week    Frequency of Social Gatherings with Friends and Family:  More than three times a week    Attends Religious Services: More than 4 times per year    Active Member of [employer] or Organizations: Yes    Attends Banker Meetings: More than 4 times per year    Marital Status: Married  Catering manager Violence: Not At Risk (11/17/2020)   Humiliation, Afraid, Rape, and Kick questionnaire    Fear of Current or Ex-Partner: No    Emotionally Abused: No    Physically Abused: No    Sexually Abused:  No    Family History  Problem Relation Age of Onset   Macular degeneration Mother    Hypothyroidism Mother    Tremor Mother    Stroke Mother 78   Crohn's disease Father 40   Alzheimer's disease Paternal Aunt    Alzheimer's disease Paternal Uncle    Parkinsonism Maternal Grandmother    Diabetes Paternal Grandmother    Colon cancer Neg Hx    Esophageal cancer Neg Hx    Rectal cancer Neg Hx     Past Medical History:  Diagnosis Date   Allergic rhinitis    Depression    DJD (degenerative joint disease)    GERD (gastroesophageal reflux disease)    Headache(784.0)    sinus   Hypercholesteremia    Hypersomnia    Hypertension    Hypothyroidism    Internal hemorrhoid 11/2008   s/p banding ([place])   Lumbar disc disease    OBSTRUCTIVE SLEEP APNEA    NPSG 04/29/07- AHI 1.1/hr, RDI 21.2/hr. Weight 176 lbs. CPAP was tried based on the RDI.     OVERACTIVE BLADDER    pt unaware   Presence of permanent cardiac pacemaker 11/11/2015   Reactive depression (situational)    Sinus node dysfunction (HCC)    Spondylosis, cervical     Past Surgical History:  Procedure Laterality Date   BIOPSY BREAST     BREAST EXCISIONAL BIOPSY Left 2011   CATARACT EXTRACTION W/ INTRAOCULAR LENS IMPLANT Right    COLONOSCOPY     EP IMPLANTABLE DEVICE N/A 11/11/2015   Procedure: Pacemaker Implant;  Surgeon: Marinus Maw, MD;  Location: [facility];  Service: Cardiovascular;  Laterality: N/A;   eye lid lift     INSERT / REPLACE / REMOVE PACEMAKER  11/11/2015    KNEE ARTHROSCOPY     Dr. Despina Hick   MOUTH SURGERY  08/25/2017   implant fell out due to bone loss   REVERSE SHOULDER ARTHROPLASTY Right 08/08/2019   Procedure: REVERSE SHOULDER ARTHROPLASTY;  Surgeon: Jones Broom, MD;  Location: [facility];  Service: Orthopedics;  Laterality: Right;   rotator cuff surgery Right    Dr. Teressa Senter   TUBAL LIGATION      Current Outpatient Medications  Medication Sig Dispense Refill   calcium carbonate (TUMS - DOSED IN MG ELEMENTAL CALCIUM) 500 MG chewable tablet Chew 1 tablet by mouth 4 (four) times daily as needed for indigestion or heartburn.     clonazePAM (KLONOPIN) 0.5 MG tablet TAKE 1/2 AT BEDTIME AS NEEDED FOR INSOMNIA, MAY INCREASE TO A WHOLE TABLET IF NEEDED 30 tablet 0   D-5000 125 MCG (5000 UT) TABS SMARTSIG:1 By Mouth     denosumab (PROLIA) 60 MG/ML SOSY injection Inject 60 mg into the skin every 6 (six) months.     fluticasone (FLONASE) 50 MCG/ACT nasal spray SPRAY 2 SPRAYS INTO EACH NOSTRIL EVERY DAY 48 mL 3   levocetirizine (XYZAL) 5 MG tablet TAKE 1 TABLET BY MOUTH EVERY DAY IN THE EVENING 90 tablet 1   levothyroxine (SYNTHROID) 125 MCG tablet TAKE 1 TABLET (125 MCG TOTAL) BY MOUTH IN THE MORNING (Patient taking differently: Take 62.5 mcg by mouth in the morning.) 90 tablet 0   metoprolol succinate (TOPROL-XL) 25 MG 24 hr tablet TAKE 1/2 TABLET BY MOUTH EVERY DAY 45 tablet 3   pantoprazole (PROTONIX) 40 MG tablet Take 1 tablet (40 mg total) by mouth daily. Take 30 minutes before eating. 90 tablet 3   polyethylene glycol (MIRALAX / GLYCOLAX) 17 g  packet Take 17 g by mouth daily as needed.     Polyvinyl Alcohol-Povidone (REFRESH OP) Place 1 drop into both eyes daily as needed (dry eyes).     simvastatin (ZOCOR) 20 MG tablet TAKE 1 TABLET BY MOUTH EVERYDAY AT BEDTIME 90 tablet 3   venlafaxine XR (EFFEXOR-XR) 75 MG 24 hr capsule Take 75 mg by mouth daily with breakfast.     Wheat Dextrin (BENEFIBER) POWD Take 1-2 Scoops by mouth every morning. (Take 1  to 2 teaspoons each morning mixed with beverage of choice)     No current facility-administered medications for this visit.    Allergies as of 06/22/2022 - Review Complete 06/22/2022  Allergen Reaction Noted   Ramipril     Sulfa antibiotics  10/19/2015   Sulfonamide derivatives    Result Notes     1 Patient Communication          Component Ref Range & Units 1 mo ago (05/18/22) 7 mo ago (11/15/21) 1 yr ago (05/31/21) 1 yr ago (05/31/21) 1 yr ago (05/13/21) 1 yr ago (08/06/20) 2 yr ago (01/06/20)  Sodium 135 - 145 mEq/L 142 137  135 R 139 140 141  Potassium 3.5 - 5.1 mEq/L 4.3 3.5  3.2 Low  R 4.1 3.5 3.9  Chloride 96 - 112 mEq/L 105 101  99 R 102 102 105  CO2 19 - 32 mEq/L 29 29  25  R 29 30 29   Glucose, Bld 70 - 99 mg/dL 87 409 High   94 CM 74 90 92  BUN 6 - 23 mg/dL 23 17  19  R 25 High  25 High  23  Creatinine, Ser 0.40 - 1.20 mg/dL 8.11 9.14  7.82 R 9.56 0.86 0.77  Total Bilirubin 0.2 - 1.2 mg/dL 0.4 0.5 0.9 R  0.6 0.4 0.6  Alkaline Phosphatase 39 - 117 U/L 31 Low  36 Low  41 R  36 Low  34 Low  42  AST 0 - 37 U/L 16 20 23  R  16 14 14   ALT 0 - 35 U/L 13 22 19  R  14 12 12   Total Protein 6.0 - 8.3 g/dL 6.7 6.6 8.1 R  6.8 6.5 7.0  Albumin 3.5 - 5.2 g/dL 4.4 4.2 4.7 R  4.4 4.2 4.6  GFR >60.00 mL/min 74.55 76.01 CM   70.60 CM 65.08 CM 74.61 CM  Comment: Calculated using the CKD-EPI Creatinine Equation (2021)  Calcium 8.4 - 10.5 mg/dL 9.2 9.0  9.3 R 8.8 9.3 8.8  Resulting Agency [place] [facility] [place] [facility] [place] [facility] [place] [facility] [place] [facility]         Specimen Collected: 05/18/22 11:06 Last Resulted: 05/18/22 17:28      Lab Flowsheet      Order Details      View Encounter      Lab and Collection Details      Routing      Result History    View All Conversations on this Encounter      CM=Additional comments  R=Reference range differs from displayed range      Result Care Coordination   Patient Communication   Edit  Comments   Add Notifications  Back to Top   As expected, you are negative for syphilis!  Written by Pearline Cables, MD on 05/20/2022 11:26 AM EDT    Other Results from 05/18/2022  Lipid panel Order: [id number] Status: Final result      Visible to patient:  Yes (not seen)      Next appt: 08/17/2022 at 08:15 AM in Cardiology ([facility] Device Remotes)      Dx: Hyperlipidemia with target LDL less t...    0 Result Notes      1 Patient Communication            Component Ref Range & Units 1 mo ago 1 yr ago 2 yr ago 3 yr ago 5 yr ago 7 yr ago 8 yr ago  Cholesterol 0 - 200 mg/dL 409 811 CM 914 R 782 CM 194 CM 218 High  CM 199 CM  Comment: ATP III Classification       Desirable:  < 200 mg/dL               Borderline High:  200 - 239 mg/dL          High:  > = 956 mg/dL  Triglycerides 0.0 - 213.0 mg/dL 86.5 78.4 CM 91 R 696.2 High  CM 129.0 CM 111.0 CM 111.0 CM  Comment: Normal:  <150 mg/dLBorderline High:  150 - 199 mg/dL  HDL >95.28 mg/dL 41.32 44.01 37 Low  R 02.72 Low  35.70 Low  42.10 40.30  VLDL 0.0 - 40.0 mg/dL 53.6 64.4  03.4 74.2 59.5 22.2  LDL Cholesterol 0 - 99 mg/dL 74 77  638 High  756 High  153 High  137 High   Total CHOL/HDL Ratio 3 3 CM 3.4 R 5 CM 5 CM 5 CM 5 CM  Comment:                Men          Women1/2 Average Risk     3.4          3.3Average Risk          5.0          4.42X Average Risk          9.6          7.13X Average Risk          15.0          11.0                      NonHDL 92.33 93.25 CM  157.16 CM 157.97 CM 175.67 CM 158.70 CM  Comment: NOTE:  Non-HDL goal should be 30 mg/dL higher than patient's LDL goal (i.e. LDL goal of < 70 mg/dL, would have non-HDL goal of < 100 mg/dL)  Resulting Agency [place] [facility] [place] [facility] [place] [facility] [place] [facility] [place] [facility]  [facility]         Specimen Collected: 05/18/22 11:06 Last Resulted: 05/18/22 17:28      Lab Flowsheet       Order Details       View Encounter       Lab and  Collection Details       Routing       Result History     View All Conversations on this Encounter      CM=Additional comments  R=Reference range differs from displayed range      Result Care Coordination   Patient Communication   Edit Comments   Add Notifications  Back to Top    As expected, you are negative for syphilis!  Written by Pearline Cables, MD on 05/20/2022 11:26 AM EDT        Contains abnormal data TSH Order: [id number] Status: Final result  Visible to patient: Yes (not seen)      Next appt: 08/17/2022 at 08:15 AM in Cardiology ([facility] Device Rem[facility])      Dx: Other specified hypothyroidism    0 Result Notes      1 Patient Communication            Component Ref Range & Units 1 mo ago (05/18/22) 7 mo ago (11/15/21) 1 yr ago (05/13/21) 1 yr ago (09/10/20) 1 yr ago (08/06/20) 2 yr ago (04/23/20) 2 yr ago (10/24/19)  TSH 0.35 - 5.50 uIU/mL 0.01 Repeated and verified X2. Low  0.89 0.90 0.81 0.77 2.47 R 1.55 R  Resulting Agency [place] [facility] [place] [facility] [place] [facility] [place] [facility]         Specimen Collected: 05/18/22 11:06 Last Resulted: 05/18/22 16:54      Lab Flowsheet       Order Details       View Encounter       Lab and Collection Details       Routing       Result History     View All Conversations on this Encounter      R=Reference range differs from displayed range          Cholesterol 0 - 200 mg/dL 782 956 CM 213 R 086 CM 194 CM 218 High  CM 199 CM  Comment: ATP III Classification       Desirable:  < 200 mg/dL               Borderline High:  200 - 239 mg/dL          High:  > = 578 mg/dL  Triglycerides 0.0 - 469.6 mg/dL 29.5 28.4 CM 91 R 132.4 High  CM 129.0 CM 111.0 CM 111.0 CM  Comment: Normal:  <150 mg/dLBorderline High:  150 - 199 mg/dL  HDL >40.10 mg/dL 27.25 36.64 37 Low  R 40.34 Low  35.70 Low  42.10 40.30  VLDL 0.0 - 40.0 mg/dL 74.2 59.5  63.8 75.6 43.3 22.2  LDL  Cholesterol 0 - 99 mg/dL 74 77  295 High  188 High  153 High  137 High   Total CHOL/HDL Ratio 3 3 CM 3.4 R 5 CM 5 CM 5 CM 5 CM  Comment:                Men          Women1/2 Average Risk     3.4          3.3Average Risk          5.0          4.42X Average Risk          9.6          7.13X Average Risk          15.0          11.0                      NonHDL 92.33 93.25 CM  157.16 CM 157.97 CM 175.67 CM 158.70 CM  Comment: NOTE:  Non-HDL goal should be 30 mg/dL higher than patient's LDL goal (i.e. LDL goal of < 70 mg/dL, would have non-HDL goal of < 100 mg/dL)  Resulting Agency [place] [facility] [place] [facility] [place] [facility] [place] [facility] [place] [facility]  [facility]             1 Patient Communication  Component Ref Range & Units 1 mo ago 1 yr ago 5 yr ago 12 yr ago 14 yr ago  Vitamin B-12 211 - 911 pg/mL 858 1,792 High  R 1,344 High  791 879  Resulting Agency [place] [facility] [place] [facility] RESULT [facility] RESULT         Specimen Collected: 05/18/22 11:06 Last Resulted: 05/18/22 16:54      Lab Flowsheet      Order Details      View Encounter      Lab and Collection Details      Routing      Result History    View All Conversations on this Encounter      R=Reference range differs from displayed range      Result Care Coordination   Patient Communication   Edit Comments   Add Notifications  Back to Top   As expected, you are negative for syphilis!  Written by Pearline Cables, MD on 05/20/2022 11:26 AM EDT      Vitals: BP 132/84   Pulse 82   Ht 5' (1.524 m)   Wt 159 lb (72.1 kg)   BMI 31.05 kg/m  Last Weight:  Wt Readings from Last 1 Encounters:  06/22/22 159 lb (72.1 kg)   Last Height:   Ht Readings from Last 1 Encounters:  06/22/22 5' (1.524 m)   Last BMI: 31 Physical exam:  General: The patient is awake, alert and appears not in acute distress. The patient is well groomed. Head:  Normocephalic, atraumatic. Neck is supple. Mallampati 4,  neck circumference:15". Nasal airflow congested ,  Retrognathia is not seen.  No Dentures, but implant.   Cardiovascular:   Paced Regular rate and rhythm, without  murmurs or carotid bruit, and without distended neck veins. Respiratory: Lungs are clear to auscultation. Skin:  Without evidence of edema, or rash Trunk: BMI is slightly up. The patient's posture is mildly stooped.    Neurologic exam : The patient is awake and alert, oriented to place and time.   Attention span & concentration ability appears normal.      06/22/2022   10:23 AM  Montreal Cognitive Assessment   Visuospatial/ Executive (0/5) 3  Naming (0/3) 3  Attention: Read list of digits (0/2) 1  Attention: Read list of letters (0/1) 1  Attention: Serial 7 subtraction starting at 100 (0/3) 3  Language: Repeat phrase (0/2) 1  Language : Fluency (0/1) 1  Abstraction (0/2) 2  Delayed Recall (0/5) 3  Orientation (0/6) 6  Total 24      11/03/2016   10:40 AM  MMSE - Mini Mental State Exam  Orientation to time 5  Orientation to Place 5  Registration 3  Attention/ Calculation 5  Recall 3  Language- name 2 objects 2  Language- repeat 1  Language- follow 3 step command 3  Language- read & follow direction 1  Write a sentence 1  Copy design 1  Total score 30     Speech is fluent,  without  Dysarthria,but  dysphonia or aphasia.  Mood and affect are appropriate.   Cranial nerves: Pupils are equal and briskly reactive to light. Funduscopic exam without  evidence of pallor or edema. Status post cataract removal.  Extraocular movements in vertical and horizontal planes intact and without nystagmus. Ptosis bilaterally, left worse. Status post Blepharoplasty-  Visual fields by finger perimetry are intact. Hearing to tuning fork and finger rub intact.   Facial sensation  intact to fine touch.  Facial motor strength is symmetric and tongue and uvula move midline.   Shoulder shrug was symmetrical.    Motor exam:  Normal tone, muscle bulk and symmetric strength in all extremities.   Sensory:  Fine touch, pinprick and vibration were tested in all extremities. Proprioception tested in the upper extremities was normal.   Coordination: Rapid alternating movements in the fingers/hands was normal. Finger-to-nose maneuver  normal without evidence of ataxia, dysmetria or tremor.   Gait and station: Patient walks without assistive device and is able unassisted to stand up . Strength within normal limits.  Stance is stable and normal. Turns with 3 Steps.  Deep tendon reflexes: in the upper and lower extremities are symmetric and intact. .    Assessment: Total time for face to face interview and examination, for review of  images and laboratory testing, neurophysiology testing and pre-existing records, including out-of -network , was 45 minutes. Family disposition: Her Dad had 8 other siblings and out of the 9 siblings 7 had dementia , most likely Alzheimer"s.  Assessment is as follows here:  1) MILD  neurocognitive Impairment with slow progression, concern about Biomarker's, genetic susceptibility of kids and grands.   Plan:  Treatment plan and additional workup planned after today includes:   1) Dementia panel, ATN and Apolipoprotein panel. This includes phosphorylated Tau protein.  MRI brain, with and without.  2) Rv with [facility] next time , in 4-5 months. NP or me. Melvyn Novas, MD

## 2022-06-23 ENCOUNTER — Telehealth: Payer: Self-pay | Admitting: Neurology

## 2022-06-23 NOTE — Telephone Encounter (Signed)
[employer] NPR sent to GI [phone] 

## 2022-07-15 ENCOUNTER — Ambulatory Visit
Admission: RE | Admit: 2022-07-15 | Discharge: 2022-07-15 | Disposition: A | Discharge status: Home / Self Care | Payer: PPO | Source: Ambulatory Visit | Attending: Neurology | Admitting: Neurology

## 2022-07-15 DIAGNOSIS — G309 Alzheimer's disease, unspecified: Secondary | ICD-10-CM

## 2022-07-16 NOTE — Progress Notes (Signed)
Mild , medial temporal lobe centered atrophy. This can indicate dementia related atrophy, we need correlating memory/ neuropsychological testing  and the tested biomarker with this finding.  Biomarkers were negative !

## 2022-07-20 ENCOUNTER — Encounter: Payer: Self-pay | Admitting: Neurology

## 2022-07-22 LAB — APOE ALZHEIMER'S RISK

## 2022-07-22 LAB — INFORMED CONSENT NEEDED

## 2022-07-22 LAB — NEUROFILAMENT LIGHT CHAIN: Neurofilament Light Chain: 3.84 pg/mL (ref 0.00–7.64)

## 2022-07-22 LAB — P-TAU181: p-tau181: 0.69 pg/mL (ref 0.00–0.97)

## 2022-07-22 LAB — EARLY ONSET ALZHEIMER'S PANEL

## 2022-07-24 ENCOUNTER — Other Ambulatory Visit: Payer: Self-pay | Admitting: Internal Medicine

## 2022-07-24 ENCOUNTER — Other Ambulatory Visit: Payer: Self-pay | Admitting: Family Medicine

## 2022-07-24 DIAGNOSIS — J301 Allergic rhinitis due to pollen: Secondary | ICD-10-CM

## 2022-08-02 ENCOUNTER — Other Ambulatory Visit: Payer: Self-pay | Admitting: Internal Medicine

## 2022-08-17 ENCOUNTER — Ambulatory Visit (INDEPENDENT_AMBULATORY_CARE_PROVIDER_SITE_OTHER): Payer: PPO

## 2022-08-17 DIAGNOSIS — I495 Sick sinus syndrome: Secondary | ICD-10-CM

## 2022-08-19 ENCOUNTER — Encounter: Payer: Self-pay | Admitting: Neurology

## 2022-08-25 DIAGNOSIS — H903 Sensorineural hearing loss, bilateral: Secondary | ICD-10-CM | POA: Insufficient documentation

## 2022-08-31 ENCOUNTER — Encounter: Payer: Self-pay | Admitting: Family Medicine

## 2022-08-31 DIAGNOSIS — L299 Pruritus, unspecified: Secondary | ICD-10-CM

## 2022-08-31 NOTE — Telephone Encounter (Signed)
Pt called back and has been scheduled, this is FYI to POD 3

## 2022-08-31 NOTE — Telephone Encounter (Signed)
Pt was called and offered an appointment with Dr Vickey Huger, pt states she will call on tomorrow to make the needed f/u appointment.  FYI to POD 3

## 2022-09-01 MED ORDER — KETOCONAZOLE 2 % EX SHAM
1.0000 | MEDICATED_SHAMPOO | CUTANEOUS | 99 refills | Status: DC
Start: 2022-09-01 — End: 2023-09-18

## 2022-09-01 NOTE — Addendum Note (Signed)
Addended by: Abbe Amsterdam C on: 09/01/2022 10:23 AM   Modules accepted: Orders

## 2022-09-02 ENCOUNTER — Encounter: Payer: Self-pay | Admitting: Family Medicine

## 2022-09-02 NOTE — Progress Notes (Signed)
Remote pacemaker transmission.   

## 2022-09-06 NOTE — Patient Instructions (Incomplete)
It was good to see you today, as always.  Recommend flu shot and COVID booster this fall  I will work on getting you set up for neurocognitive testing to get a more in-depth assessment of your memory function  We can have you start on Aricept, 5 mg at bedtime.  We can increase this to 10 mg in 4 to 6 weeks, let me know what you think of it  I am suspicious your home blood pressure cuff may not be accurate; please asked the nurse at [facility] to check this for you.  Your blood pressure is borderline low today, we may need to back you down to the 12.5 mg of metoprolol once again  [employer]- can be a good resource for spouse and patient support Association in [place], [place] Address: [address], [place], [place] [address] Phone: [phone]  [place] counseling may be helpful for you and I believe they do bill insurance!   [facility] at [facility] [address] [place],  [place]  [address] Main: [phone]

## 2022-09-06 NOTE — Progress Notes (Unsigned)
[facility] [address], [address] [place], [place] [address] [phone]  Date:  09/07/2022   Name:  Theresa Mejia   DOB:  12-19-1940   MRN:  [id number]  PCP:  Pearline Cables, MD    Chief Complaint: No chief complaint on file.   History of Present Illness:  Theresa Mejia is a 82 y.o. very pleasant female patient who presents with the following:  Patient seen today for follow-up.  She recently contacted me due to concern about blood pressure Most recent visit with myself was in May-notes from that visit: Patient seen today with concern of difficulty sleeping, fatigue and possible memory loss as well as increased depression. History of pacemaker due to sinus node dysfunction, osteopenia, lumbar spine disease, sleep apnea, hyperlipidemia, hypothyroidism, hypertension Most recent visit with myself was in November-at that time she was having hip pain, feeling down and tired.  We switched her from fluoxetine to Wellbutrin and sent her for physical therapy   Theresa Mejia is concerned about her memory, and her family has noted some problems too  Pt notes her father and his twin both had Alzheimer dementia - the other 7 sibs in the family also had dementia as well as many other family members.  MGM had Parkinson's disease. Her mother however lived to 73 with no major issues with her memory, etc She did get lost in a familiar place- going to her synagogue which is very familiar to her recently.  This frightened her   Theresa Mejia has seen Vic Blackbird- psychologist a couple of times for depression, and she also mentioned concern about her memory   She was seen by neurology in June-Dr. Dohmeier noted mild neurocognitive impairment for blood progression, concerned about genetic component Her biomarker testing came back negative, and CT was noncontributory  More recently, allergies seen at ENT, Dr. Dorma Russell at [facility].  Her blood pressure was  elevated at 160/94 at that time.  I will then check her blood pressure at home a few times and it continued to be elevated.  However, her pulse was 80 or higher She reached out to me last week and I had her increase her dose of metoprolol from 12.5 to 25 mg daily, we scheduled this appointment BP Readings from Last 3 Encounters:  06/22/22 132/84  05/18/22 126/80  02/15/22 (!) 148/88    Most recent routine lab work on chart is from May-kidney function normal  Patient Active Problem List   Diagnosis Date Noted   Major neurocognitive disorder due to unknown etiology (HCC) 06/22/2022   Memory loss or impairment 06/22/2022   Family history of Alzheimer's disease 06/22/2022   Lumbar radiculopathy 12/06/2020   Pain of left hip joint 11/11/2020   Pacemaker 08/25/2020   Osteoarthritis of carpometacarpal (CMC) joint of thumb 01/16/2019   Pain in right hand 01/16/2019   Trigger finger of right hand 01/16/2019   Osteopenia 01/09/2019   Pansinusitis 03/06/2018   Orthostatic hypotension 12/06/2016   Sinus node dysfunction (HCC) 11/11/2015   Routine general medical examination at a health care facility 06/04/2015   Insomnia with sleep apnea 06/04/2015   Bronchitis, chronic obstructive w acute bronchitis (HCC) 12/10/2014   Lumbar disc disease 09/01/2010   Obstructive sleep apnea 05/31/2007   Hyperlipidemia with target LDL less than 130 02/05/2007   Depression with anxiety 02/05/2007   Allergic rhinitis 02/05/2007   Hypothyroidism 02/02/2007   Essential hypertension 02/02/2007  Past Medical History:  Diagnosis Date   Allergic rhinitis    Depression    DJD (degenerative joint disease)    GERD (gastroesophageal reflux disease)    Headache(784.0)    sinus   Hypercholesteremia    Hypersomnia    Hypertension    Hypothyroidism    Internal hemorrhoid 11/2008   s/p banding ([facility])   Lumbar disc disease    OBSTRUCTIVE SLEEP APNEA    NPSG 04/29/07- AHI 1.1/hr, RDI 21.2/hr. Weight 176 lbs.  CPAP was tried based on the RDI.     OVERACTIVE BLADDER    pt unaware   Presence of permanent cardiac pacemaker 11/11/2015   Reactive depression (situational)    Sinus node dysfunction (HCC)    Spondylosis, cervical     Past Surgical History:  Procedure Laterality Date   BIOPSY BREAST     BREAST EXCISIONAL BIOPSY Left 2011   CATARACT EXTRACTION W/ INTRAOCULAR LENS IMPLANT Right    COLONOSCOPY     EP IMPLANTABLE DEVICE N/A 11/11/2015   Procedure: Pacemaker Implant;  Surgeon: Marinus Maw, MD;  Location: [facility] IN[facility]ASIVE [facility];  Service: Cardiovascular;  Laterality: N/A;   eye lid lift     INSERT / REPLACE / REMOVE PACEMAKER  11/11/2015   KNEE ARTHROSCOPY     Dr. Despina Hick   MOUTH SURGERY  08/25/2017   implant fell out due to bone loss   REVERSE SHOULDER ARTHROPLASTY Right 08/08/2019   Procedure: REVERSE SHOULDER ARTHROPLASTY;  Surgeon: Jones Broom, MD;  Location: [facility] OR[facility];  Service: Orthopedics;  Laterality: Right;   rotator cuff surgery Right    Dr. Teressa Senter   TUBAL LIGATION      Social History   Tobacco Use   Smoking status: Former    Current packs/day: 0.00    Types: Cigarettes    Start date: 01/10/1958    Quit date: 01/10/1966    Years since quitting: 56.6   Smokeless tobacco: Never  Vaping Use   Vaping status: Never Used  Substance Use Topics   Alcohol use: No   Drug use: No    Family History  Problem Relation Age of Onset   Macular degeneration Mother    Hypothyroidism Mother    Tremor Mother    Stroke Mother 52   Crohn's disease Father 38   Alzheimer's disease Paternal Aunt    Alzheimer's disease Paternal Uncle    Parkinsonism Maternal Grandmother    Diabetes Paternal Grandmother    Colon cancer Neg Hx    Esophageal cancer Neg Hx    Rectal cancer Neg Hx     Allergies  Allergen Reactions   Ramipril     Unknown reaction   Sulfa Antibiotics     Says it dropped her blood pressure really low.    Sulfonamide Derivatives     REACTION: hypotension     Medication list has been reviewed and updated.  Current Outpatient Medications on File Prior to Visit  Medication Sig Dispense Refill   calcium carbonate (TUMS - DOSED IN MG ELEMENTAL CALCIUM) 500 MG chewable tablet Chew 1 tablet by mouth 4 (four) times daily as needed for indigestion or heartburn.     clonazePAM (KLONOPIN) 0.5 MG tablet TAKE 1/2 AT BEDTIME AS NEEDED FOR INSOMNIA, MAY INCREASE TO A WHOLE TABLET IF NEEDED 30 tablet 0   D-5000 125 MCG (5000 UT) TABS SMARTSIG:1 By Mouth     denosumab (PROLIA) 60 MG/ML SOSY injection Inject 60 mg into the skin every 6 (six) months.  fluticasone (FLONASE) 50 MCG/ACT nasal spray SPRAY 2 SPRAYS INTO EACH NOSTRIL EVERY DAY 48 mL 3   ketoconazole (NIZORAL) 2 % shampoo Apply 1 Application topically 2 (two) times a week. Use as needed for itchy scalp 120 mL PRN   levocetirizine (XYZAL) 5 MG tablet TAKE 1 TABLET BY MOUTH EVERY DAY IN THE EVENING 90 tablet 1   levothyroxine (SYNTHROID) 125 MCG tablet TAKE 1 TABLET (125 MCG TOTAL) BY MOUTH IN THE MORNING (Patient taking differently: Take 62.5 mcg by mouth in the morning.) 90 tablet 0   metoprolol succinate (TOPROL-XL) 25 MG 24 hr tablet TAKE 1/2 TABLET BY MOUTH DAILY 45 tablet 2   pantoprazole (PROTONIX) 40 MG tablet Take 1 tablet (40 mg total) by mouth daily. Take 30 minutes before eating. 90 tablet 3   polyethylene glycol (MIRALAX / GLYCOLAX) 17 g packet Take 17 g by mouth daily as needed.     Polyvinyl Alcohol-Povidone (REFRESH OP) Place 1 drop into both eyes daily as needed (dry eyes).     simvastatin (ZOCOR) 20 MG tablet TAKE 1 TABLET BY MOUTH EVERYDAY AT BEDTIME 90 tablet 3   venlafaxine XR (EFFEXOR-XR) 75 MG 24 hr capsule Take 75 mg by mouth daily with breakfast.     Wheat Dextrin (BENEFIBER) POWD Take 1-2 Scoops by mouth every morning. (Take 1 to 2 teaspoons each morning mixed with beverage of choice)     No current facility-administered medications on file prior to visit.    Review of  Systems:  As per HPI- otherwise negative.   Physical Examination: There were no vitals filed for this visit. There were no vitals filed for this visit. There is no height or weight on file to calculate BMI. Ideal Body Weight:    GEN: no acute distress. HEENT: Atraumatic, Normocephalic.  Ears and Nose: No external deformity. CV: RRR, No M/G/R. No JVD. No thrill. No extra heart sounds. PULM: CTA B, no wheezes, crackles, rhonchi. No retractions. No resp. distress. No accessory muscle use. ABD: S, NT, ND, +BS. No rebound. No HSM. EXTR: No c/c/e PSYCH: Normally interactive. Conversant.    Assessment and Plan: ***  Signed Abbe Amsterdam, MD

## 2022-09-07 ENCOUNTER — Ambulatory Visit (INDEPENDENT_AMBULATORY_CARE_PROVIDER_SITE_OTHER): Payer: PPO | Admitting: Family Medicine

## 2022-09-07 VITALS — BP 118/64 | HR 75 | Temp 98.2°F | Resp 18 | Ht 60.0 in | Wt 160.8 lb

## 2022-09-07 DIAGNOSIS — R413 Other amnesia: Secondary | ICD-10-CM | POA: Diagnosis not present

## 2022-09-07 DIAGNOSIS — I10 Essential (primary) hypertension: Secondary | ICD-10-CM | POA: Diagnosis not present

## 2022-09-07 MED ORDER — DONEPEZIL HCL 5 MG PO TBDP: 5.0000 mg | ORAL_TABLET | Freq: Every day | ORAL | 1 refills | Status: DC

## 2022-09-09 ENCOUNTER — Encounter: Payer: Self-pay | Admitting: Family Medicine

## 2022-09-09 ENCOUNTER — Encounter: Payer: Self-pay | Admitting: Psychology

## 2022-09-25 ENCOUNTER — Other Ambulatory Visit: Payer: Self-pay | Admitting: Family Medicine

## 2022-09-25 DIAGNOSIS — F329 Major depressive disorder, single episode, unspecified: Secondary | ICD-10-CM

## 2022-09-26 NOTE — Telephone Encounter (Signed)
Effexor has "historical provider" beside of it. Okay to refill?

## 2022-10-02 ENCOUNTER — Encounter: Payer: Self-pay | Admitting: Family Medicine

## 2022-10-05 ENCOUNTER — Encounter: Payer: Self-pay | Admitting: Family Medicine

## 2022-10-05 ENCOUNTER — Encounter: Payer: Self-pay | Admitting: Neurology

## 2022-10-12 ENCOUNTER — Telehealth: Payer: Self-pay | Admitting: Internal Medicine

## 2022-10-12 ENCOUNTER — Other Ambulatory Visit: Payer: Self-pay | Admitting: Family Medicine

## 2022-10-12 ENCOUNTER — Ambulatory Visit: Payer: PPO | Admitting: Neurology

## 2022-10-12 ENCOUNTER — Encounter: Payer: Self-pay | Admitting: Neurology

## 2022-10-12 VITALS — BP 141/95 | HR 81 | Ht 60.0 in | Wt 161.0 lb

## 2022-10-12 DIAGNOSIS — I10 Essential (primary) hypertension: Secondary | ICD-10-CM

## 2022-10-12 DIAGNOSIS — G3184 Mild cognitive impairment, so stated: Secondary | ICD-10-CM | POA: Diagnosis not present

## 2022-10-12 NOTE — Telephone Encounter (Signed)
Call to patient regarding her concerns of irregular HR. Sh estates she has recently felt lightheadedness a few times a week as well as palpitations that "feel fast". She states she felt the lightheadedness about 30 mins after taking her toprol today, which resolved quickly with rest. However, at her visit today with Dr. Vickey Huger her BP was recorded as 141/95, HR 82.  (At last OV with Dr. Ladona Ridgel on 02/15/22 it was 148/88.) She states that on 10/1 she checked her BP at home and it was 124/78 and HR was 82.   Made an appointment for patient w/ APP on 10/29 at 10:40 AM. Next appointment with Dr. Ladona Ridgel scheduled for February 2025.  Advised patient to call 911 if palpitations escalate to chest pain /SOB or if she experiences syncope with loss of consciousness, patient verbalized understanding.

## 2022-10-12 NOTE — Patient Instructions (Signed)
ASSESSMENT AND PLAN 82 y.o. year old female  here with:  Apparently non-AD biomarker  cognitive disorder, could be age related. ??  TSH was highly abnormal.  MCI with amnestic spells.  Mild white mater disease.  I will offer MRI brain to rule out vascular disease. Will need to involve Dr Ladona Ridgel.   Prn RV after Dr Javier Glazier testing, 10  months from now.     I would like to thank Copland, Gwenlyn Found, MD and Copland, Gwenlyn Found, Md [address] [place],  [place] [address] for allowing me to meet with and to take care of this pleasant patient.   CC: I will share my notes with Dr Sharrell Ku .

## 2022-10-12 NOTE — Progress Notes (Signed)
Provider:  Melvyn Novas, MD  Primary Care Physician:  Pearline Cables, MD [address] [place] [address]     Referring Provider: Pearline Cables, Md [address] [place],  [place] [address]          Chief Complaint according to patient   Patient presents with:    Neurocognitive             HISTORY OF PRESENT ILLNESS:  Theresa Mejia is a 82 y.o. female patient who is here for revisit 10/12/2022 for  neuro-cognitve disorder.  Chief concern according to patient :  memory follow up.  The biomarker testing by neurofilament's and p tau protein and genetic markers for late onset Alzheimer's disease were all negative  Given this constellation I doubt that the patient suffers from a form of Alzheimer's disease. She has corrected her cholesterol and triglyceride levels, and has normal HBa1c,  TSH was not detected., verified twice. CT brain normal for age, white matter , global atrophy.   Memory will be retested : >moca today.  Family disposition: Her Dad had 8 other siblings and out of the 9 siblings 7 had dementia , most likely Alzheimer"s.         06-22-2022: Theresa Mejia is a 82 y.o. female and seen here upon referral from Dr. Patsy Lager for a new problem Consultation/ Evaluation of MEMORY LOSS. This patient reports onset of memory problems  over a period of 24 months . She was last seen in 2020 for a sleep consultation, and had Insomnia and OSA, tried CAP for a year and felt no better ,  just making life more complicated. She moved to [place] and has been there with her husband for about one year- she has since lost her way to synagogue or back home, and her new residence is only blocks away form her old place on [address] / near [address], and should be familiar.  She also stated she cannot recall seeing me in that sleep consultation visit in 2020.  She had a shoulder replacement and cannot recall the name  of her surgeon, and she has no trouble with recall of appointments.      03-07-2018  Theresa Mejia is a 82 y.o. female patient , seen here as in a referral  from Dr. Patsy Lager for a new evaluation of possible sleep apnea.    Mrs. Mings has not felt rested and refreshed for 2 decades at least.  She has many times mentioning this to primary care and specialty care physicians.  She has also heard that there are many new treatments out should she be diagnosed with obstructive sleep apnea. Mrs. Tremain reports that over 20 years ago she had a sleep study under the guidance of Dr. Jetty Duhamel who found borderline apnea and gave her the option to try CPAP.  She felt that CPAP use rather cumbersome it did not hurt her but it did not change her fragmented sleep or her non-restful sleep pattern.  She discontinued after a while.  In the interval of 20 years she has developed other co-morbidities:  She now has a pacemaker, due to bradycardia and near syncope. She carries a diagnosis of hypothyroidism, was treated by Dr. [name] at the time.    Sleep habits are as follows: dinner time 6 PM- dinner without alcohol, bedtime is 9 PM. She no  longer shares the room with her husband, she sleeps in the guest room- she  reads in bed, the room is cool and quiet- but not dark- she has a lot of dormant electronics in the bedroom.sleeps on her side, wakes up prone. Bed is raised by 12 degrees,1 pillow. Nocturia times 1-2. Dreams often but doesn't remember the dreams.  Not longer snoring- wakes up " fine' but fatigues quickly,  Sleep medical history; frequent sinuitis. fatigue, non restful, non refreshed.  Family sleep history: 2 bothers , none of them has apnea. One is 9 years-younger, Rabbi and fully active, no memory concerns. Middle Brother lives in [place] and ultra-orthodox, no contact.      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  2 adult children, 2 grandchildren. 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 ETOH socially, Caffeine: 50/50 coffee in AM and one cup at lunch, 1 soda a day.   Review of Systems: Out of a complete 14 system review, the patient complains of only the following symptoms, and all other reviewed systems are negative.:  Fatigue, sleepiness , short term memory.       10/12/2022   10:14 AM 06/22/2022   10:23 AM  Montreal Cognitive Assessment   Visuospatial/ Executive (0/5) 4 3  Naming (0/3) 3 3  Attention: Read list of digits (0/2) 2 1  Attention: Read list of letters (0/1) 1 1  Attention: Serial 7 subtraction starting at 100 (0/3) 1 3  Language: Repeat phrase (0/2) 2 1  Language : Fluency (0/1) 1 1  Abstraction (0/2) 2 2  Delayed Recall (0/5) 2 3  Orientation (0/6) 6 6  Total 24 24    Social History   Socioeconomic History   Marital status: Married    Spouse name: Not on file   Number of children: 2   Years of education: Not on file   Highest education level: Not on file  Occupational History   Occupation: Armed forces operational officer radiology-retire    Employer: [employer]    Comment: data entry  Tobacco Use   Smoking status: Former    Current packs/day: 0.00    Types: Cigarettes    Start date: 01/10/1958    Quit date: 01/10/1966    Years since quitting: 56.7   Smokeless tobacco: Never  Vaping Use   Vaping status: Never Used  Substance and Sexual Activity   Alcohol use: No   Drug use: No   Sexual activity: Never    Birth control/protection: Post-menopausal  Other Topics Concern   Not on file  Social History Narrative   married, lives with spouse -   Retired 05/2013   Social Determinants of Health   Financial Resource Strain: Low Risk  (02/16/2021)   Overall Financial Resource Strain (CARDIA)    Difficulty of Paying Living Expenses: Not hard at all  Food Insecurity: Low Risk  (08/25/2022)   Received from [facility]   Hunger Vital Sign    Worried About Running Out of Food in the Last Year: Never true    Ran Out of Food in the Last Year: Never  true  Transportation Needs: Not on file (08/25/2022)  Physical Activity: Insufficiently Active (04/06/2021)   Exercise Vital Sign    Days of Exercise per Week: 2 days    Minutes of Exercise per Session: 40 min  Stress: No Stress Concern Present (11/17/2020)   [employer] of Occupational Health - Occupational Stress Questionnaire    Feeling of Stress : Not at  all  Social Connections: Socially Integrated (11/17/2020)   Social Connection and Isolation Panel [NHANES]    Frequency of Communication with Friends and Family: More than three times a week    Frequency of Social Gatherings with Friends and Family: More than three times a week    Attends Religious Services: More than 4 times per year    Active Member of [employer] or Organizations: Yes    Attends Engineer, structural: More than 4 times per year    Marital Status: Married    Family History  Problem Relation Age of Onset   Macular degeneration Mother    Hypothyroidism Mother    Tremor Mother    Stroke Mother 26   Crohn's disease Father 40   Alzheimer's disease Paternal Aunt    Alzheimer's disease Paternal Uncle    Parkinsonism Maternal Grandmother    Diabetes Paternal Grandmother    Colon cancer Neg Hx    Esophageal cancer Neg Hx    Rectal cancer Neg Hx     Past Medical History:  Diagnosis Date   Allergic rhinitis    Depression    DJD (degenerative joint disease)    GERD (gastroesophageal reflux disease)    Headache(784.0)    sinus   Hypercholesteremia    Hypersomnia    Hypertension    Hypothyroidism    Internal hemorrhoid 11/2008   s/p banding ([place])   Lumbar disc disease    OBSTRUCTIVE SLEEP APNEA    NPSG 04/29/07- AHI 1.1/hr, RDI 21.2/hr. Weight 176 lbs. CPAP was tried based on the RDI.     OVERACTIVE BLADDER    pt unaware   Presence of permanent cardiac pacemaker 11/11/2015   Reactive depression (situational)    Sinus node dysfunction (HCC)    Spondylosis, cervical     Past Surgical History:   Procedure Laterality Date   BIOPSY BREAST     BREAST EXCISIONAL BIOPSY Left 2011   CATARACT EXTRACTION W/ INTRAOCULAR LENS IMPLANT Right    COLONOSCOPY     EP IMPLANTABLE DEVICE N/A 11/11/2015   Procedure: Pacemaker Implant;  Surgeon: Marinus Maw, MD;  Location: [facility];  Service: Cardiovascular;  Laterality: N/A;   eye lid lift     INSERT / REPLACE / REMOVE PACEMAKER  11/11/2015   KNEE ARTHROSCOPY     Dr. Despina Hick   MOUTH SURGERY  08/25/2017   implant fell out due to bone loss   REVERSE SHOULDER ARTHROPLASTY Right 08/08/2019   Procedure: REVERSE SHOULDER ARTHROPLASTY;  Surgeon: Jones Broom, MD;  Location: [facility];  Service: Orthopedics;  Laterality: Right;   rotator cuff surgery Right    Dr. Teressa Senter   TUBAL LIGATION       Current Outpatient Medications on File Prior to Visit  Medication Sig Dispense Refill   calcium carbonate (TUMS - DOSED IN MG ELEMENTAL CALCIUM) 500 MG chewable tablet Chew 1 tablet by mouth 4 (four) times daily as needed for indigestion or heartburn.     clonazePAM (KLONOPIN) 0.5 MG tablet TAKE 1/2 AT BEDTIME AS NEEDED FOR INSOMNIA, MAY INCREASE TO A WHOLE TABLET IF NEEDED 30 tablet 0   D-5000 125 MCG (5000 UT) TABS SMARTSIG:1 By Mouth     denosumab (PROLIA) 60 MG/ML SOSY injection Inject 60 mg into the skin every 6 (six) months.     donepezil (ARICEPT ODT) 5 MG disintegrating tablet Take 1 tablet (5 mg total) by mouth at bedtime. 30 tablet 1   fluticasone (FLONASE) 50 MCG/ACT nasal  spray SPRAY 2 SPRAYS INTO EACH NOSTRIL EVERY DAY 48 mL 3   ketoconazole (NIZORAL) 2 % shampoo Apply 1 Application topically 2 (two) times a week. Use as needed for itchy scalp 120 mL PRN   levocetirizine (XYZAL) 5 MG tablet TAKE 1 TABLET BY MOUTH EVERY DAY IN THE EVENING 90 tablet 1   levothyroxine (SYNTHROID) 125 MCG tablet TAKE 1 TABLET (125 MCG TOTAL) BY MOUTH IN THE MORNING 90 tablet 0   metoprolol succinate (TOPROL-XL) 25 MG 24 hr tablet TAKE 1/2 TABLET BY MOUTH DAILY  (Patient taking differently: Take 25 mg by mouth daily.) 45 tablet 2   pantoprazole (PROTONIX) 40 MG tablet Take 1 tablet (40 mg total) by mouth daily. Take 30 minutes before eating. 90 tablet 3   polyethylene glycol (MIRALAX / GLYCOLAX) 17 g packet Take 17 g by mouth daily as needed.     Polyvinyl Alcohol-Povidone (REFRESH OP) Place 1 drop into both eyes daily as needed (dry eyes).     simvastatin (ZOCOR) 20 MG tablet TAKE 1 TABLET BY MOUTH EVERYDAY AT BEDTIME 90 tablet 3   venlafaxine XR (EFFEXOR-XR) 75 MG 24 hr capsule TAKE 1 CAPSULE BY MOUTH DAILY WITH BREAKFAST. 30 capsule 3   Wheat Dextrin (BENEFIBER) POWD Take 1-2 Scoops by mouth every morning. (Take 1 to 2 teaspoons each morning mixed with beverage of choice)     No current facility-administered medications on file prior to visit.    Allergies  Allergen Reactions   Ramipril     Unknown reaction   Sulfa Antibiotics     Says it dropped her blood pressure really low.    Sulfonamide Derivatives     REACTION: hypotension     DIAGNOSTIC DATA (LABS, IMAGING, TESTING) - I reviewed patient records, labs, notes, testing and imaging myself where available.  Lab Results  Component Value Date   WBC 6.7 11/15/2021   HGB 12.6 11/15/2021   HCT 37.5 11/15/2021   MCV 89.6 11/15/2021   PLT 283.0 11/15/2021      Component Value Date/Time   NA 142 05/18/2022 1106   K 4.3 05/18/2022 1106   CL 105 05/18/2022 1106   CO2 29 05/18/2022 1106   GLUCOSE 87 05/18/2022 1106   BUN 23 05/18/2022 1106   CREATININE 0.76 05/18/2022 1106   CREATININE 0.70 11/04/2015 1106   CALCIUM 9.2 05/18/2022 1106   PROT 6.7 05/18/2022 1106   ALBUMIN 4.4 05/18/2022 1106   AST 16 05/18/2022 1106   ALT 13 05/18/2022 1106   ALKPHOS 31 (L) 05/18/2022 1106   BILITOT 0.4 05/18/2022 1106   GFRNONAA >60 05/31/2021 1235   GFRAA >60 08/01/2019 1058   Lab Results  Component Value Date   CHOL 132 05/18/2022   HDL 39.40 05/18/2022   LDLCALC 74 05/18/2022    LDLDIRECT 148.3 09/01/2010   TRIG 92.0 05/18/2022   CHOLHDL 3 05/18/2022   Lab Results  Component Value Date   HGBA1C 5.6 05/18/2022   Lab Results  Component Value Date   VITAMINB12 858 05/18/2022   Lab Results  Component Value Date   TSH 0.01 Repeated and verified X2. (L) 05/18/2022    PHYSICAL EXAM:  Today's Vitals   10/12/22 1008 10/12/22 1026  BP: (!) 152/93 (!) 141/95  Pulse: 81 81  Weight: 161 lb (73 kg)   Height: 5' (1.524 m)    Body mass index is 31.44 kg/m.   Wt Readings from Last 3 Encounters:  10/12/22 161 lb (73 kg)  09/07/22 160  lb 12.8 oz (72.9 kg)  06/22/22 159 lb (72.1 kg)     Ht Readings from Last 3 Encounters:  10/12/22 5' (1.524 m)  09/07/22 5' (1.524 m)  06/22/22 5' (1.524 m)      General:  The patient is awake, alert and appears not in acute distress. The patient is well groomed. Head: Normocephalic, atraumatic. Neck is supple. Mallampati 4,  neck circumference:15". Nasal airflow congested ,  Retrognathia is not seen.  No Dentures, but implant.   Cardiovascular:   Paced Regular rate and rhythm, without  murmurs or carotid bruit, and without distended neck veins. Respiratory: Lungs are clear to auscultation. Skin:  Without evidence of edema, or rash Trunk: BMI is slightly up. The patient's posture is mildly stooped.    Neurologic exam : The patient is awake and alert, oriented to place and time.   Attention span & concentration ability appears normal.     10/12/2022   10:14 AM 06/22/2022   10:23 AM  Montreal Cognitive Assessment   Visuospatial/ Executive (0/5) 4 3  Naming (0/3) 3 3  Attention: Read list of digits (0/2) 2 1  Attention: Read list of letters (0/1) 1 1  Attention: Serial 7 subtraction starting at 100 (0/3) 1 3  Language: Repeat phrase (0/2) 2 1  Language : Fluency (0/1) 1 1  Abstraction (0/2) 2 2  Delayed Recall (0/5) 2 3  Orientation (0/6) 6 6  Total 24 24          11/03/2016   10:40 AM  MMSE - Mini Mental  State Exam  Orientation to time 5  Orientation to Place 5  Registration 3  Attention/ Calculation 5  Recall 3  Language- name 2 objects 2  Language- repeat 1  Language- follow 3 step command 3  Language- read & follow direction 1  Write a sentence 1  Copy design 1  Total score 30      Speech is fluent,  without  Dysarthria,but  dysphonia or aphasia.  Mood and affect are appropriate.   Cranial nerves: Pupils are equal and briskly reactive to light. Funduscopic exam without  evidence of pallor or edema. Status post cataract removal.  Extraocular movements in vertical and horizontal planes intact and without nystagmus. Ptosis bilaterally, left worse. Status post Blepharoplasty-  Visual fields by finger perimetry are intact. Hearing to tuning fork and finger rub intact.   Facial sensation intact to fine touch.  Facial motor strength is symmetric and tongue and uvula move midline.  Shoulder shrug was symmetrical.    Motor exam:  Normal tone, muscle bulk and symmetric strength in all extremities.   Sensory:  Fine touch, pinprick and vibration were tested in all extremities. Proprioception tested in the upper extremities was normal.   Coordination: Rapid alternating movements in the fingers/hands was normal. Finger-to-nose maneuver  normal without evidence of ataxia, dysmetria or tremor.   Gait and station: Patient walks without assistive device and is able unassisted to stand up . Strength within normal limits.  Stance is stable and normal. Turns with 3 Steps.  Deep tendon reflexes: in the upper and lower extremities are symmetric and intact. .     Assessment: Total time for face to face interview and examination, for review of  images and laboratory testing, neurophysiology testing and pre-existing records, including out-of -network , was 30 minutes.  ASSESSMENT AND PLAN 82 y.o. year old female  here with:  Apparently non-AD biomarker  cognitive disorder, could be age related.  MCI with amnestic spells.  Mild white mater disease.  I will offer MRI brain to rule out vascular disease. Will need to involve Dr Ladona Ridgel.  Hearing testing recommended. Dr Ermalinda Barrios  Prn RV after Dr Clelia Croft testing, 10  months from now.     I would like to thank  [place], Gwenlyn Found, Md [address] [place],  [place] [address] for allowing me to meet with and to take care of this pleasant patient.   CC: I will share my notes with Dr Sharrell Ku .  After spending a total time of  35  minutes face to face and additional time for physical and neurologic examination, review of laboratory studies,  personal review of imaging studies, reports and results of other testing and review of referral information / records as far as provided in visit,   Electronically signed by: Melvyn Novas, MD 10/12/2022 10:28 AM  [facility] Board certified by The [employer] of Sleep Medicine and Diplomate of the [employer] of Sleep Medicine. Board certified In Neurology through the ABPN, Fellow of the [employer] of Neurology.

## 2022-10-12 NOTE — Telephone Encounter (Signed)
Patient c/o Palpitations:  STAT if patient reporting lightheadedness, shortness of breath, or chest pain  How long have you had palpitations/irregular HR/ Afib? Are you having the symptoms now?   No  Are you currently experiencing lightheadedness, SOB or CP?  Not this morning  Do you have a history of afib (atrial fibrillation) or irregular heart rhythm?   Yes  Have you checked your BP or HR? (document readings if available):     BP 141/95  HR 81 (second reading)  Are you experiencing any other symptoms?     Patient stated she has been having irregular heartbeats.  Patient stated her BP readings at her doctor's office this morning was high the first time they took the reading.

## 2022-10-18 ENCOUNTER — Encounter: Payer: Self-pay | Admitting: Family Medicine

## 2022-10-18 DIAGNOSIS — E038 Other specified hypothyroidism: Secondary | ICD-10-CM

## 2022-10-18 NOTE — Addendum Note (Signed)
Addended by: Abbe Amsterdam C on: 10/18/2022 05:00 PM   Modules accepted: Orders

## 2022-10-21 ENCOUNTER — Encounter: Payer: Self-pay | Admitting: Family Medicine

## 2022-10-21 ENCOUNTER — Other Ambulatory Visit (INDEPENDENT_AMBULATORY_CARE_PROVIDER_SITE_OTHER): Payer: PPO

## 2022-10-21 DIAGNOSIS — E038 Other specified hypothyroidism: Secondary | ICD-10-CM

## 2022-10-21 LAB — TSH: TSH: 1.53 u[IU]/mL (ref 0.35–5.50)

## 2022-10-22 NOTE — Progress Notes (Signed)
[facility] [address], [address] [place], [place] [address] [phone]  Date:  10/26/2022   Name:  Theresa Mejia   DOB:  [birth year]   MRN:  [id number]  PCP:  Pearline Cables, MD    Chief Complaint: Follow-up (Concerns/ questions: 1. lightheadedness/ dizziness or vertigo. 2. Leg cramps at night. 3. Emotional feeling/ sweats (doom)/)   History of Present Illness:  Theresa Mejia is a 82 y.o. very pleasantnt female patient who presents with the following:  Patient seen today for follow-up- seen today with her husband Theresa Mejia Most recent visit with myself was in August History of hypothyroidism, essential hypertension Neurology is seeing her for mild cognitive impairment with memory loss, most recent visit October 2-she is not thought to have Alzheimer's disease There may be some element of sleep disorder as well  She is now taking Aricept in addition to venlafaxine 75 She contacted me recently with concern of low energy, feeling tired and having leg cramps at night.  We rechecked her thyroid which was in normal range  Pt notes she has felt "totally exhausted" for over a decade Her husband notes she does not want to put his pain activities due to feeling too tired She also notes feeling both lightheaded and vertigo - this has been present for maybe 3 weeks or maybe longer  She also notes sweats and leg cramps for the last few weeks The leg cramps will wake her up at night  Her neuropsych testing is in 2025 She is seeing cardiology in about 2 weeks  They have not noted any low BP readings   We checked her B12 in May, normal-no history of deficiency that I can see Vitamin D was also normal 1 year ago  She has an abnormal TSH in May- quiet low- no change was made but her TSH was back to normal at recent recheck A1c 5.6 in May B12 and Dit D normal in the last year  Normal CBC a year ago  Lab Results  Component Value Date    TSH 1.53 10/21/2022   BP Readings from Last 3 Encounters:  10/26/22 136/88  10/12/22 (!) 141/95  09/07/22 118/64    Flu, covid done  already  Seen by Dr Ladona Ridgel in the spring: Assess/Plan: 1. Sinus node dysfunction - she is stable and asymptomatic, s/p PPM insertion- 2017 2. PPM - her Biotronik DDD PM is working normally. Will recheck in several months. 3. HTN - her BP is fairly well controlled. No change.    Patient Active Problem List   Diagnosis Date Noted   Amnestic MCI (mild cognitive impairment with memory loss) 10/12/2022   Major neurocognitive disorder due to unknown etiology (HCC) 06/22/2022   Memory loss or impairment 06/22/2022   Family history of Alzheimer's disease 06/22/2022   Lumbar radiculopathy 12/06/2020   Pain of left hip joint 11/11/2020   Pacemaker 08/25/2020   Osteoarthritis of carpometacarpal (CMC) joint of thumb 01/16/2019   Pain in right hand 01/16/2019   Trigger finger of right hand 01/16/2019   Osteopenia 01/09/2019   Pansinusitis 03/06/2018   Orthostatic hypotension 12/06/2016   Sinus node dysfunction (HCC) 11/11/2015   Routine general medical examination at a health care facility 06/04/2015   Insomnia with sleep apnea 06/04/2015   Bronchitis, chronic obstructive w acute bronchitis (HCC) 12/10/2014   Lumbar disc disease 09/01/2010   Obstructive sleep apnea 05/31/2007   Hyperlipidemia with target LDL  less than 130 02/05/2007   Depression with anxiety 02/05/2007   Allergic rhinitis 02/05/2007   Hypothyroidism 02/02/2007   Essential hypertension 02/02/2007    Past Medical History:  Diagnosis Date   Allergic rhinitis    Depression    DJD (degenerative joint disease)    GERD (gastroesophageal reflux disease)    Headache(784.0)    sinus   Hypercholesteremia    Hypersomnia    Hypertension    Hypothyroidism    Internal hemorrhoid 11/2008   s/p banding (Medoff)   Lumbar disc disease    OBSTRUCTIVE SLEEP APNEA    NPSG 04/29/07- AHI 1.1/hr,  RDI 21.2/hr. Weight 176 lbs. CPAP was tried based on the RDI.     OVERACTIVE BLADDER    pt unaware   Presence of permanent cardiac pacemaker 11/11/2015   Reactive depression (situational)    Sinus node dysfunction (HCC)    Spondylosis, cervical     Past Surgical History:  Procedure Laterality Date   BIOPSY BREAST     BREAST EXCISIONAL BIOPSY Left 2011   CATARACT EXTRACTION W/ INTRAOCULAR LENS IMPLANT Right    COLONOSCOPY     EP IMPLANTABLE DEVICE N/A 11/11/2015   Procedure: Pacemaker Implant;  Surgeon: Marinus Maw, MD;  Location: [facility];  Service: Cardiovascular;  Laterality: N/A;   eye lid lift     INSERT / REPLACE / REMOVE PACEMAKER  11/11/2015   KNEE ARTHROSCOPY     Dr. Despina Hick   MOUTH SURGERY  08/25/2017   implant fell out due to bone loss   REVERSE SHOULDER ARTHROPLASTY Right 08/08/2019   Procedure: REVERSE SHOULDER ARTHROPLASTY;  Surgeon: Jones Broom, MD;  Location: [facility];  Service: Orthopedics;  Laterality: Right;   rotator cuff surgery Right    Dr. Teressa Senter   TUBAL LIGATION      Social History   Tobacco Use   Smoking status: Former    Current packs/day: 0.00    Types: Cigarettes    Start date: 01/10/1958    Quit date: 01/10/1966    Years since quitting: 56.8   Smokeless tobacco: Never  Vaping Use   Vaping status: Never Used  Substance Use Topics   Alcohol use: No   Drug use: No    Family History  Problem Relation Age of Onset   Macular degeneration Mother    Hypothyroidism Mother    Tremor Mother    Stroke Mother 32   Crohn's disease Father 17   Alzheimer's disease Paternal Aunt    Alzheimer's disease Paternal Uncle    Parkinsonism Maternal Grandmother    Diabetes Paternal Grandmother    Colon cancer Neg Hx    Esophageal cancer Neg Hx    Rectal cancer Neg Hx     Allergies  Allergen Reactions   Ramipril     Unknown reaction   Sulfa Antibiotics     Says it dropped her blood pressure really low.    Sulfonamide Derivatives      REACTION: hypotension    Medication list has been reviewed and updated.  Current Outpatient Medications on File Prior to Visit  Medication Sig Dispense Refill   calcium carbonate (TUMS - DOSED IN MG ELEMENTAL CALCIUM) 500 MG chewable tablet Chew 1 tablet by mouth 4 (four) times daily as needed for indigestion or heartburn.     clonazePAM (KLONOPIN) 0.5 MG tablet TAKE 1/2 AT BEDTIME AS NEEDED FOR INSOMNIA, MAY INCREASE TO A WHOLE TABLET IF NEEDED 30 tablet 0   D-5000 125 MCG (  5000 UT) TABS SMARTSIG:1 By Mouth     denosumab (PROLIA) 60 MG/ML SOSY injection Inject 60 mg into the skin every 6 (six) months.     donepezil (ARICEPT ODT) 5 MG disintegrating tablet Take 1 tablet (5 mg total) by mouth at bedtime. 30 tablet 1   fluticasone (FLONASE) 50 MCG/ACT nasal spray SPRAY 2 SPRAYS INTO EACH NOSTRIL EVERY DAY 48 mL 3   ketoconazole (NIZORAL) 2 % shampoo Apply 1 Application topically 2 (two) times a week. Use as needed for itchy scalp 120 mL PRN   levocetirizine (XYZAL) 5 MG tablet TAKE 1 TABLET BY MOUTH EVERY DAY IN THE EVENING 90 tablet 1   levothyroxine (SYNTHROID) 125 MCG tablet TAKE 1 TABLET (125 MCG TOTAL) BY MOUTH IN THE MORNING 90 tablet 0   metoprolol succinate (TOPROL-XL) 25 MG 24 hr tablet TAKE 1/2 TABLET BY MOUTH DAILY (Patient taking differently: Take 25 mg by mouth daily.) 45 tablet 2   pantoprazole (PROTONIX) 40 MG tablet Take 1 tablet (40 mg total) by mouth daily. Take 30 minutes before eating. 90 tablet 3   polyethylene glycol (MIRALAX / GLYCOLAX) 17 g packet Take 17 g by mouth daily as needed.     Polyvinyl Alcohol-Povidone (REFRESH OP) Place 1 drop into both eyes daily as needed (dry eyes).     simvastatin (ZOCOR) 20 MG tablet TAKE 1 TABLET BY MOUTH EVERYDAY AT BEDTIME 90 tablet 3   venlafaxine XR (EFFEXOR-XR) 75 MG 24 hr capsule TAKE 1 CAPSULE BY MOUTH DAILY WITH BREAKFAST. 30 capsule 3   Wheat Dextrin (BENEFIBER) POWD Take 1-2 Scoops by mouth every morning. (Take 1 to 2 teaspoons  each morning mixed with beverage of choice)     No current facility-administered medications on file prior to visit.    Review of Systems:  As per HPI- otherwise negative.   Physical Examination: Vitals:   10/26/22 1039  BP: 136/88  Pulse: 76  Resp: 18  Temp: 97.6 F (36.4 C)  SpO2: 96%   Vitals:   10/26/22 1039  Weight: 152 lb 14.4 oz (69.4 kg)  Height: 5' (1.524 m)   Body mass index is 29.86 kg/m. Ideal Body Weight: Weight in (lb) to have BMI = 25: 127.7  GEN: no acute distress.  Overweight, overall looks well HEENT: Atraumatic, Normocephalic.  Bilateral TM wnl, oropharynx normal.  PEERL,EOMI.   Ears and Nose: No external deformity. CV: RRR, No M/G/R. No JVD. No thrill. No extra heart sounds. PULM: CTA B, no wheezes, crackles, rhonchi. No retractions. No resp. distress. No accessory muscle use. ABD: S, NT, ND, +BS. No rebound. No HSM. EXTR: No c/c/e PSYCH: Normally interactive. Conversant.   Orthostatic vitals Supine 132/82, 78 Sitting 128/80, 80 Standing 118/78, 91 Assessment and Plan:  Chronic fatigue - Plan: CBC, Comprehensive metabolic panel, VITAMIN D 25 Hydroxy (Vit-D Deficiency, Fractures), Vitamin B12, Ferritin  Essential hypertension - Plan: CBC, Comprehensive metabolic panel  Memory loss  Gastroesophageal reflux disease, unspecified whether esophagitis present - Plan: H. pylori breath test  Leg cramps - Plan: Magnesium  Patient seen today with concern of feeling tired for over a decade.  She also notes general lack of motivation and is dealing with memory loss I have reached out to her cardiologist about possibly holding beta-blocker, and her neurologist about attempting a stimulant such as Adderall or Provigil or similar Will plan further follow- up pending labs.   Signed Abbe Amsterdam, MD  Received labs as below, message to patient . Results for orders  placed or performed in visit on 10/26/22  CBC  Result Value Ref Range   WBC 7.1 4.0  - 10.5 K/uL   RBC 4.79 3.87 - 5.11 Mil/uL   Platelets 282.0 150.0 - 400.0 K/uL   Hemoglobin 14.2 12.0 - 15.0 g/dL   HCT 95.0 93.2 - 67.1 %   MCV 90.4 78.0 - 100.0 fl   MCHC 32.8 30.0 - 36.0 g/dL   RDW 24.5 80.9 - 98.3 %  Comprehensive metabolic panel  Result Value Ref Range   Sodium 139 135 - 145 mEq/L   Potassium 3.6 3.5 - 5.1 mEq/L   Chloride 99 96 - 112 mEq/L   CO2 31 19 - 32 mEq/L   Glucose, Bld 76 70 - 99 mg/dL   BUN 17 6 - 23 mg/dL   Creatinine, Ser 3.82 0.40 - 1.20 mg/dL   Total Bilirubin 0.7 0.2 - 1.2 mg/dL   Alkaline Phosphatase 48 39 - 117 U/L   AST 22 0 - 37 U/L   ALT 16 0 - 35 U/L   Total Protein 7.2 6.0 - 8.3 g/dL   Albumin 4.7 3.5 - 5.2 g/dL   GFR 50.53 >97.67 mL/min   Calcium 9.7 8.4 - 10.5 mg/dL  VITAMIN D 25 Hydroxy (Vit-D Deficiency, Fractures)  Result Value Ref Range   VITD 56.13 30.00 - 100.00 ng/mL  Vitamin B12  Result Value Ref Range   Vitamin B-12 704 211 - 911 pg/mL  Ferritin  Result Value Ref Range   Ferritin 45.0 10.0 - 291.0 ng/mL  Magnesium  Result Value Ref Range   Magnesium 2.0 1.5 - 2.5 mg/dL

## 2022-10-22 NOTE — Patient Instructions (Incomplete)
It was good to see you again today!

## 2022-10-25 ENCOUNTER — Ambulatory Visit: Payer: PPO | Admitting: Neurology

## 2022-10-26 ENCOUNTER — Encounter: Payer: Self-pay | Admitting: Family Medicine

## 2022-10-26 ENCOUNTER — Ambulatory Visit: Payer: PPO | Admitting: Family Medicine

## 2022-10-26 VITALS — BP 136/88 | HR 76 | Temp 97.6°F | Resp 18 | Ht 60.0 in | Wt 152.9 lb

## 2022-10-26 DIAGNOSIS — R413 Other amnesia: Secondary | ICD-10-CM

## 2022-10-26 DIAGNOSIS — I10 Essential (primary) hypertension: Secondary | ICD-10-CM | POA: Diagnosis not present

## 2022-10-26 DIAGNOSIS — K219 Gastro-esophageal reflux disease without esophagitis: Secondary | ICD-10-CM

## 2022-10-26 DIAGNOSIS — R5382 Chronic fatigue, unspecified: Secondary | ICD-10-CM

## 2022-10-26 DIAGNOSIS — R252 Cramp and spasm: Secondary | ICD-10-CM

## 2022-10-26 LAB — CBC
HCT: 43.3 % (ref 36.0–46.0)
Hemoglobin: 14.2 g/dL (ref 12.0–15.0)
MCHC: 32.8 g/dL (ref 30.0–36.0)
MCV: 90.4 fL (ref 78.0–100.0)
Platelets: 282 10*3/uL (ref 150.0–400.0)
RBC: 4.79 Mil/uL (ref 3.87–5.11)
RDW: 13.7 % (ref 11.5–15.5)
WBC: 7.1 10*3/uL (ref 4.0–10.5)

## 2022-10-26 LAB — COMPREHENSIVE METABOLIC PANEL
ALT: 16 U/L (ref 0–35)
AST: 22 U/L (ref 0–37)
Albumin: 4.7 g/dL (ref 3.5–5.2)
Alkaline Phosphatase: 48 U/L (ref 39–117)
BUN: 17 mg/dL (ref 6–23)
CO2: 31 meq/L (ref 19–32)
Calcium: 9.7 mg/dL (ref 8.4–10.5)
Chloride: 99 meq/L (ref 96–112)
Creatinine, Ser: 0.79 mg/dL (ref 0.40–1.20)
GFR: 70.94 mL/min (ref >60.00)
Glucose, Bld: 76 mg/dL (ref 70–99)
Potassium: 3.6 meq/L (ref 3.5–5.1)
Sodium: 139 meq/L (ref 135–145)
Total Bilirubin: 0.7 mg/dL (ref 0.2–1.2)
Total Protein: 7.2 g/dL (ref 6.0–8.3)

## 2022-10-26 LAB — FERRITIN: Ferritin: 45 ng/mL (ref 10.0–291.0)

## 2022-10-26 LAB — MAGNESIUM: Magnesium: 2 mg/dL (ref 1.5–2.5)

## 2022-10-26 LAB — VITAMIN D 25 HYDROXY (VIT D DEFICIENCY, FRACTURES): VITD: 56.13 ng/mL (ref 30.00–100.00)

## 2022-10-26 LAB — VITAMIN B12: Vitamin B-12: 704 pg/mL (ref 211–911)

## 2022-10-27 ENCOUNTER — Encounter: Payer: Self-pay | Admitting: Family Medicine

## 2022-10-27 DIAGNOSIS — R5382 Chronic fatigue, unspecified: Secondary | ICD-10-CM

## 2022-10-27 DIAGNOSIS — F5101 Primary insomnia: Secondary | ICD-10-CM

## 2022-10-27 LAB — H. PYLORI BREATH TEST: H. pylori Breath Test: NOT DETECTED

## 2022-10-28 MED ORDER — MODAFINIL 200 MG PO TABS
200.0000 mg | ORAL_TABLET | Freq: Every day | ORAL | 1 refills | Status: DC
Start: 2022-10-28 — End: 2022-11-21

## 2022-11-07 NOTE — Progress Notes (Unsigned)
  Electrophysiology Office Note:   ID:  Theresa, Mejia 10/03/1940, MRN [id number]  Primary Cardiologist: None Electrophysiologist: Lewayne Bunting, MD  {Click to update primary MD,subspecialty MD or APP then REFRESH:1}    History of Present Illness:   Theresa Mejia is a 82 y.o. female with h/o palpitations, chronotropic incompetence s/p PPM seen today for acute visit due to palpitations, with long history of the same. .    Patient reports ***.  she denies chest pain, palpitations, dyspnea, PND, orthopnea, nausea, vomiting, dizziness, syncope, edema, weight gain, or early satiety.   Review of systems complete and found to be negative unless listed in HPI.   EP Information / Studies Reviewed:    EKG is ordered today. Personal review as below.       PPM Interrogation-  reviewed in detail today,  See PACEART report.  Device History: Biotronik Dual Chamber PPM implanted 11/2015 for SND/symptomatic brady  Physical Exam:   VS:  There were no vitals taken for this visit.   Wt Readings from Last 3 Encounters:  10/26/22 152 lb 14.4 oz (69.4 kg)  10/12/22 161 lb (73 kg)  09/07/22 160 lb 12.8 oz (72.9 kg)     GEN: Well nourished, well developed in no acute distress NECK: No JVD; No carotid bruits CARDIAC: {EPRHYTHM:28826}, no murmurs, rubs, gallops RESPIRATORY:  Clear to auscultation without rales, wheezing or rhonchi  ABDOMEN: Soft, non-tender, non-distended EXTREMITIES:  No edema; No deformity   ASSESSMENT AND PLAN:    Symptomatic bradycardia s/p Biotronik PPM  Normal PPM function See Pace Art report No changes today  HTN Stable on current regimen   Palpitations Reassurance given ***  {Click here to Review PMH, Prob List, Meds, Allergies, SHx, FHx  :1}   Disposition:   Follow up with {EPPROVIDERS:28135} {EPFOLLOW UP:28173}  Signed, Graciella Freer, PA-C

## 2022-11-08 ENCOUNTER — Ambulatory Visit: Payer: PPO | Attending: Student | Admitting: Student

## 2022-11-08 ENCOUNTER — Ambulatory Visit (INDEPENDENT_AMBULATORY_CARE_PROVIDER_SITE_OTHER): Payer: PPO

## 2022-11-08 ENCOUNTER — Encounter: Payer: Self-pay | Admitting: Student

## 2022-11-08 VITALS — BP 130/86 | HR 81 | Ht 60.0 in | Wt 161.4 lb

## 2022-11-08 DIAGNOSIS — R002 Palpitations: Secondary | ICD-10-CM

## 2022-11-08 DIAGNOSIS — Z950 Presence of cardiac pacemaker: Secondary | ICD-10-CM

## 2022-11-08 DIAGNOSIS — I495 Sick sinus syndrome: Secondary | ICD-10-CM | POA: Diagnosis not present

## 2022-11-08 DIAGNOSIS — I10 Essential (primary) hypertension: Secondary | ICD-10-CM | POA: Diagnosis not present

## 2022-11-08 LAB — CUP PACEART INCLINIC DEVICE CHECK
Date Time Interrogation Session: 20241029115334
Implantable Lead Connection Status: 753985
Implantable Lead Connection Status: 753985
Implantable Lead Implant Date: 20171101
Implantable Lead Implant Date: 20171101
Implantable Lead Location: 753859
Implantable Lead Location: 753860
Implantable Lead Model: 377
Implantable Lead Model: 377
Implantable Lead Serial Number: 49553678
Implantable Lead Serial Number: 49617393
Implantable Pulse Generator Implant Date: 20171101
Pulse Gen Model: 394969
Pulse Gen Serial Number: 68817609

## 2022-11-08 NOTE — Patient Instructions (Signed)
Medication Instructions:  Your physician recommends that you continue on your current medications as directed. Please refer to the Current Medication list given to you today.  *If you need a refill on your cardiac medications before your next appointment, please call your pharmacy*  Lab Work: None ordered If you have labs (blood work) drawn today and your tests are completely normal, you will receive your results only by: MyChart Message (if you have MyChart) OR A paper copy in the mail If you have any lab test that is abnormal or we need to change your treatment, we will call you to review the results.  Follow-Up: At [facility], you and your health needs are our priority.  As part of our continuing mission to provide you with exceptional heart care, we have created designated Provider Care Teams.  These Care Teams include your primary Cardiologist (physician) and Advanced Practice Providers (APPs -  Physician Assistants and Nurse Practitioners) who all work together to provide you with the care you need, when you need it.  Your next appointment:   6 month(s)  Provider:   Lewayne Bunting, MD

## 2022-11-08 NOTE — Progress Notes (Unsigned)
Enrolled patient for a 7 day Zio XT monitor to be mailed to patients home   Theresa Mejia to read

## 2022-11-10 ENCOUNTER — Telehealth: Payer: Self-pay

## 2022-11-10 ENCOUNTER — Encounter: Payer: Self-pay | Admitting: Family Medicine

## 2022-11-10 NOTE — Telephone Encounter (Signed)
PA approved.   31-OCT-24:30-APR-25 Modafinil 200MG  OR TABS Quantity:30;

## 2022-11-10 NOTE — Telephone Encounter (Signed)
PA initiated via Covermymeds; KEY: [id number]. Awaiting determination

## 2022-11-11 ENCOUNTER — Encounter: Payer: Self-pay | Admitting: Internal Medicine

## 2022-11-11 DIAGNOSIS — R002 Palpitations: Secondary | ICD-10-CM | POA: Diagnosis not present

## 2022-11-12 NOTE — Progress Notes (Unsigned)
[facility] [address] [place], [place] [address] [phone]  Date:  11/21/2022   Name:  Theresa Mejia   DOB:  1940/06/09   MRN:  [id number]  PCP:  Pearline Cables, MD    Chief Complaint: No chief complaint on file.   History of Present Illness:  Theresa Mejia is a 82 y.o. very pleasant female patient who presents with the following:  Patient seen today for follow-up visit Last seen by myself about 3 weeks ago History of hypothyroidism, essential hypertension Neurology is seeing her for mild cognitive impairment with memory loss, most recent visit October 2-she is not thought to have Alzheimer's disease There may be some element of sleep disorder as well  She recently started a 7-day Zio patch. We also added modafanil recently to see if this might help with fatigue  Notes from last visit: Pt notes she has felt "totally exhausted" for over a decade Her husband notes she does not want to plan activities due to feeling too tired She also notes feeling both lightheaded and vertigo - this has been present for maybe 3 weeks or maybe longer  She also notes sweats and leg cramps for the last few weeks The leg cramps will wake her up at night  Her neuropsych testing is in 2025 She is seeing cardiology in about 2 weeks They have not noted any low BP readings  We checked her B12 in May, normal-no history of deficiency that I can see Vitamin D was also normal 1 year ago She has an abnormal TSH in May- quiet low- no change was made but her TSH was back to normal at recent recheck  She saw cardiology on 10/29-  Symptomatic bradycardia s/p Biotronik PPM  Normal PPM function See Arita Miss Art report No changes today HTN -> Orthostatic hypotension Orthostatic today which may correlate with her synptoms Only BP med is low dose Toprol. She will make sure she is taking at bedtime.  Palpitations Reassurance given  Will have  her wear a 7 day monitor for reassurance She did have 1 brief episode of SVT 9/27, but none since.    Patient Active Problem List   Diagnosis Date Noted   Amnestic MCI (mild cognitive impairment with memory loss) 10/12/2022   Major neurocognitive disorder due to unknown etiology (HCC) 06/22/2022   Memory loss or impairment 06/22/2022   Family history of Alzheimer's disease 06/22/2022   Lumbar radiculopathy 12/06/2020   Pain of left hip joint 11/11/2020   Pacemaker 08/25/2020   Osteoarthritis of carpometacarpal (CMC) joint of thumb 01/16/2019   Pain in right hand 01/16/2019   Trigger finger of right hand 01/16/2019   Osteopenia 01/09/2019   Pansinusitis 03/06/2018   Orthostatic hypotension 12/06/2016   Sinus node dysfunction (HCC) 11/11/2015   Routine general medical examination at a health care facility 06/04/2015   Insomnia with sleep apnea 06/04/2015   Bronchitis, chronic obstructive w acute bronchitis (HCC) 12/10/2014   Lumbar disc disease 09/01/2010   Obstructive sleep apnea 05/31/2007   Hyperlipidemia with target LDL less than 130 02/05/2007   Depression with anxiety 02/05/2007   Allergic rhinitis 02/05/2007   Hypothyroidism 02/02/2007   Essential hypertension 02/02/2007    Past Medical History:  Diagnosis Date   Allergic rhinitis    Depression    DJD (degenerative joint disease)    GERD (gastroesophageal reflux disease)    Headache(784.0)    sinus  Hypercholesteremia    Hypersomnia    Hypertension    Hypothyroidism    Internal hemorrhoid 11/2008   s/p banding ([facility])   Lumbar disc disease    OBSTRUCTIVE SLEEP APNEA    NPSG 04/29/07- AHI 1.1/hr, RDI 21.2/hr. Weight 176 lbs. CPAP was tried based on the RDI.     OVERACTIVE BLADDER    pt unaware   Presence of permanent cardiac pacemaker 11/11/2015   Reactive depression (situational)    Sinus node dysfunction (HCC)    Spondylosis, cervical     Past Surgical History:  Procedure Laterality Date   BIOPSY  BREAST     BREAST EXCISIONAL BIOPSY Left 2011   CATARACT EXTRACTION W/ INTRAOCULAR LENS IMPLANT Right    COLONOSCOPY     EP IMPLANTABLE DEVICE N/A 11/11/2015   Procedure: Pacemaker Implant;  Surgeon: Marinus Maw, MD;  Location: [facility];  Service: Cardiovascular;  Laterality: N/A;   eye lid lift     INSERT / REPLACE / REMOVE PACEMAKER  11/11/2015   KNEE ARTHROSCOPY     Dr. Despina Hick   MOUTH SURGERY  08/25/2017   implant fell out due to bone loss   REVERSE SHOULDER ARTHROPLASTY Right 08/08/2019   Procedure: REVERSE SHOULDER ARTHROPLASTY;  Surgeon: Jones Broom, MD;  Location: [facility];  Service: Orthopedics;  Laterality: Right;   rotator cuff surgery Right    Dr. Teressa Senter   TUBAL LIGATION      Social History   Tobacco Use   Smoking status: Former    Current packs/day: 0.00    Types: Cigarettes    Start date: 01/10/1958    Quit date: 01/10/1966    Years since quitting: 56.8   Smokeless tobacco: Never  Vaping Use   Vaping status: Never Used  Substance Use Topics   Alcohol use: No   Drug use: No    Family History  Problem Relation Age of Onset   Macular degeneration Mother    Hypothyroidism Mother    Tremor Mother    Stroke Mother 99   Crohn's disease Father 17   Alzheimer's disease Paternal Aunt    Alzheimer's disease Paternal Uncle    Parkinsonism Maternal Grandmother    Diabetes Paternal Grandmother    Colon cancer Neg Hx    Esophageal cancer Neg Hx    Rectal cancer Neg Hx     Allergies  Allergen Reactions   Ramipril     Unknown reaction   Sulfa Antibiotics     Says it dropped her blood pressure really low.    Sulfonamide Derivatives     REACTION: hypotension    Medication list has been reviewed and updated.  Current Outpatient Medications on File Prior to Visit  Medication Sig Dispense Refill   calcium carbonate (TUMS - DOSED IN MG ELEMENTAL CALCIUM) 500 MG chewable tablet Chew 1 tablet by mouth 4 (four) times daily as needed for indigestion or  heartburn.     clonazePAM (KLONOPIN) 0.5 MG tablet TAKE 1/2 AT BEDTIME AS NEEDED FOR INSOMNIA, MAY INCREASE TO A WHOLE TABLET IF NEEDED 30 tablet 0   D-5000 125 MCG (5000 UT) TABS SMARTSIG:1 By Mouth     denosumab (PROLIA) 60 MG/ML SOSY injection Inject 60 mg into the skin every 6 (six) months.     donepezil (ARICEPT ODT) 5 MG disintegrating tablet Take 1 tablet (5 mg total) by mouth at bedtime. 30 tablet 1   fluticasone (FLONASE) 50 MCG/ACT nasal spray SPRAY 2 SPRAYS INTO EACH NOSTRIL EVERY  DAY 48 mL 3   ketoconazole (NIZORAL) 2 % shampoo Apply 1 Application topically 2 (two) times a week. Use as needed for itchy scalp 120 mL PRN   levocetirizine (XYZAL) 5 MG tablet TAKE 1 TABLET BY MOUTH EVERY DAY IN THE EVENING 90 tablet 1   levothyroxine (SYNTHROID) 125 MCG tablet TAKE 1 TABLET (125 MCG TOTAL) BY MOUTH IN THE MORNING (Patient taking differently: Take 125 mcg by mouth in the morning. 1/2 tablet) 90 tablet 0   metoprolol succinate (TOPROL-XL) 25 MG 24 hr tablet TAKE 1/2 TABLET BY MOUTH DAILY (Patient taking differently: Take 25 mg by mouth daily.) 45 tablet 2   modafinil (PROVIGIL) 200 MG tablet Take 1 tablet (200 mg total) by mouth daily. Take in the morning 30 tablet 1   pantoprazole (PROTONIX) 40 MG tablet Take 1 tablet (40 mg total) by mouth daily. Take 30 minutes before eating. 90 tablet 3   polyethylene glycol (MIRALAX / GLYCOLAX) 17 g packet Take 17 g by mouth daily as needed for mild constipation.     Polyvinyl Alcohol-Povidone (REFRESH OP) Place 1 drop into both eyes daily as needed (dry eyes).     simvastatin (ZOCOR) 20 MG tablet TAKE 1 TABLET BY MOUTH EVERYDAY AT BEDTIME 90 tablet 3   venlafaxine XR (EFFEXOR-XR) 75 MG 24 hr capsule TAKE 1 CAPSULE BY MOUTH DAILY WITH BREAKFAST. 30 capsule 3   Wheat Dextrin (BENEFIBER) POWD Take 1-2 Scoops by mouth as needed (constipation). (Take 1 to 2 teaspoons each morning mixed with beverage of choice)     No current facility-administered medications  on file prior to visit.    Review of Systems:  As per HPI- otherwise negative.   Physical Examination: There were no vitals filed for this visit. There were no vitals filed for this visit. There is no height or weight on file to calculate BMI. Ideal Body Weight:    GEN: no acute distress. HEENT: Atraumatic, Normocephalic.  Ears and Nose: No external deformity. CV: RRR, No M/G/R. No JVD. No thrill. No extra heart sounds. PULM: CTA B, no wheezes, crackles, rhonchi. No retractions. No resp. distress. No accessory muscle use. ABD: S, NT, ND, +BS. No rebound. No HSM. EXTR: No c/c/e PSYCH: Normally interactive. Conversant.    Assessment and Plan: ***  Signed Abbe Amsterdam, MD

## 2022-11-14 ENCOUNTER — Telehealth: Payer: Self-pay | Admitting: Family Medicine

## 2022-11-14 NOTE — Telephone Encounter (Signed)
Copied from CRM [id number]. Topic: Medicare AWV >> Nov 14, 2022  2:56 PM Payton Doughty wrote: Reason for CRM: Called LVM 11/14/2022 to schedule Annual Wellness Visit  Verlee Rossetti; Care Guide Ambulatory Clinical Support [place] [facility] Direct Dial: [phone]

## 2022-11-15 DIAGNOSIS — H8112 Benign paroxysmal vertigo, left ear: Secondary | ICD-10-CM | POA: Diagnosis not present

## 2022-11-15 DIAGNOSIS — H8111 Benign paroxysmal vertigo, right ear: Secondary | ICD-10-CM | POA: Diagnosis not present

## 2022-11-15 DIAGNOSIS — H8110 Benign paroxysmal vertigo, unspecified ear: Secondary | ICD-10-CM | POA: Diagnosis not present

## 2022-11-16 ENCOUNTER — Ambulatory Visit (INDEPENDENT_AMBULATORY_CARE_PROVIDER_SITE_OTHER): Payer: PPO

## 2022-11-16 DIAGNOSIS — I495 Sick sinus syndrome: Secondary | ICD-10-CM

## 2022-11-16 LAB — CUP PACEART REMOTE DEVICE CHECK
Battery Voltage: 50
Date Time Interrogation Session: 20241106125044
Implantable Lead Connection Status: 753985
Implantable Lead Connection Status: 753985
Implantable Lead Implant Date: 20171101
Implantable Lead Implant Date: 20171101
Implantable Lead Location: 753859
Implantable Lead Location: 753860
Implantable Lead Model: 377
Implantable Lead Model: 377
Implantable Lead Serial Number: 49553678
Implantable Lead Serial Number: 49617393
Implantable Pulse Generator Implant Date: 20171101
Pulse Gen Model: 394969
Pulse Gen Serial Number: 68817609

## 2022-11-21 ENCOUNTER — Ambulatory Visit (INDEPENDENT_AMBULATORY_CARE_PROVIDER_SITE_OTHER): Payer: PPO | Admitting: Family Medicine

## 2022-11-21 VITALS — BP 122/80 | HR 75 | Temp 97.7°F | Resp 18 | Ht 60.0 in | Wt 160.6 lb

## 2022-11-21 DIAGNOSIS — R5382 Chronic fatigue, unspecified: Secondary | ICD-10-CM | POA: Diagnosis not present

## 2022-11-21 DIAGNOSIS — Z9189 Other specified personal risk factors, not elsewhere classified: Secondary | ICD-10-CM | POA: Diagnosis not present

## 2022-11-21 DIAGNOSIS — F5101 Primary insomnia: Secondary | ICD-10-CM | POA: Diagnosis not present

## 2022-11-21 MED ORDER — MODAFINIL 200 MG PO TABS: 200.0000 mg | ORAL_TABLET | Freq: Every day | ORAL | 1 refills | Status: DC

## 2022-11-21 NOTE — Patient Instructions (Addendum)
I hope the modafanil will help you with your energy level- please let me know how this woks for you  If you want to try putting the Aricept aside for a couple of weeks and see if you notice any difference We can also try going up on the effexor- let's try one thing at a time however  Please update me in a week or so   Please plan one activity per day

## 2022-11-22 ENCOUNTER — Telehealth: Payer: Self-pay

## 2022-11-22 NOTE — Progress Notes (Signed)
   Care Guide Note  11/22/2022 Name: Theresa Mejia MRN: [id number] DOB: 1940-11-20  Referred by: Pearline Cables, MD Reason for referral : Care Coordination (Outreach to schedule with Pharm d )   Theresa Mejia is a 82 y.o. year old female who is a primary care patient of Copland, Gwenlyn Found, MD. Theresa Mejia was referred to the pharmacist for assistance related to HTN.    Successful contact was made with the patient to discuss pharmacy services including being ready for the pharmacist to call at least 5 minutes before the scheduled appointment time, to have medication bottles and any blood sugar or blood pressure readings ready for review. The patient agreed to meet with the pharmacist via with the pharmacist via telephone visit on (date/time).  11/28/2022  Penne Lash, RMA Care Guide [facility]  [place], [place] [address] Direct Dial: [phone] [email or web address]

## 2022-11-25 DIAGNOSIS — R002 Palpitations: Secondary | ICD-10-CM | POA: Diagnosis not present

## 2022-11-28 ENCOUNTER — Other Ambulatory Visit: Payer: Self-pay | Admitting: Family Medicine

## 2022-11-28 ENCOUNTER — Encounter: Payer: Self-pay | Admitting: Family Medicine

## 2022-11-28 ENCOUNTER — Ambulatory Visit (INDEPENDENT_AMBULATORY_CARE_PROVIDER_SITE_OTHER): Payer: PPO | Admitting: Pharmacist

## 2022-11-28 DIAGNOSIS — R5382 Chronic fatigue, unspecified: Secondary | ICD-10-CM

## 2022-11-28 DIAGNOSIS — F5101 Primary insomnia: Secondary | ICD-10-CM

## 2022-11-28 DIAGNOSIS — Z1231 Encounter for screening mammogram for malignant neoplasm of breast: Secondary | ICD-10-CM

## 2022-11-28 NOTE — Progress Notes (Signed)
11/28/2022 Name: Theresa Mejia MRN: [id number] DOB: 12-24-1940  Chief Complaint  Patient presents with   Medication Management    Theresa Mejia is a 82 y.o. year old female who presented for a telephone visit.   They were referred to the pharmacist by their PCP for assistance in managing  potential medication side effect .    Subjective:  Patient reports that she has been experiencing fatigue and exhaustion during the day. Started over the last year. She also has lightheadedness, She has started to take meclizine as needed for lightheadedness and helps a little.  Dr Patsy Lager recommended she hold donepezil and metoprolol - she started holding donepezil at the end of October and metoprolol 11/1.  She has not noticed much of a difference in fatigue since holding these medications.  Has not checked blood pressure since holding metoprolol but she does have a blood pressure cuff at home.   Dr Patsy Lager prescribed Modafinil for daytime drowsiness but patient never started because she was concerned about the side effects.   Patient also mentions that she does not sleep well at night over the last few years. Tried melatonin - did not help.  She is taking clonazepam 0.5 tablets at bedtime every night. Does not feel that it is working but has only taken for about a week. She finds it hard to fall asleep. Getting about 4 or 5 hours per night. She did have a nap yesterday that lasted 2 hour and reports she was able to sleep ok last night.   Lab Results  Component Value Date   TSH 1.53 10/21/2022   Taking levothyroxine = 0.5 tablet once a day. Has taken this dose for several years.    Objective:  Lab Results  Component Value Date   HGBA1C 5.6 05/18/2022    Lab Results  Component Value Date   CREATININE 0.79 10/26/2022   BUN 17 10/26/2022   NA 139 10/26/2022   K 3.6 10/26/2022   CL 99 10/26/2022   CO2 31 10/26/2022    Lab Results  Component Value Date   CHOL 132 05/18/2022    HDL 39.40 05/18/2022   LDLCALC 74 05/18/2022   LDLDIRECT 148.3 09/01/2010   TRIG 92.0 05/18/2022   CHOLHDL 3 05/18/2022    Medications Reviewed Today     Reviewed by Henrene Pastor, RPH-CPP (Pharmacist) on 11/28/22 at 1525  Med List Status: <None>   Medication Order Taking? Sig Documenting Provider Last Dose Status Informant  calcium carbonate (TUMS - DOSED IN MG ELEMENTAL CALCIUM) 500 MG chewable tablet [id number] No Chew 1 tablet by mouth 4 (four) times daily as needed for indigestion or heartburn.  Patient not taking: Reported on 11/28/2022   [provider] Not Taking Active Self  Cholecalciferol (VITAMIN D) 50 MCG (2000 UT) CAPS [id number] Yes Take 1 capsule by mouth daily at 12 noon. [provider] Taking Active   clonazePAM (KLONOPIN) 0.5 MG tablet [id number] Yes TAKE 1/2 AT BEDTIME AS NEEDED FOR INSOMNIA, MAY INCREASE TO A WHOLE TABLET IF NEEDED Copland, Gwenlyn Found, MD Taking Active   cyanocobalamin (VITAMIN B12) 1000 MCG tablet [id number] Yes Take 1,000 mcg by mouth 3 (three) times a week. [provider] Taking Active   denosumab (PROLIA) 60 MG/ML SOSY injection [id number] No Inject 60 mg into the skin every 6 (six) months.  Patient not taking: Reported on 11/28/2022   [provider] Not Taking Active   diclofenac Sodium (VOLTAREN ARTHRITIS PAIN) 1 % GEL  [id number] Yes Apply topically 4 (four) times daily. [provider] Taking Active   donepezil (ARICEPT ODT) 5 MG disintegrating tablet [id number] No Take 1 tablet (5 mg total) by mouth at bedtime.  Patient not taking: Reported on 11/28/2022   Copland, Gwenlyn Found, MD Not Taking Active   fluticasone (FLONASE) 50 MCG/ACT nasal spray [id number] Yes SPRAY 2 SPRAYS INTO EACH NOSTRIL EVERY DAY Copland, Gwenlyn Found, MD Taking Active   guaifenesin (HUMIBID E) 400 MG TABS tablet [id number] Yes Take 400 mg by mouth daily. [provider] Taking Active   ketoconazole (NIZORAL) 2 % shampoo  [id number]  Apply 1 Application topically 2 (two) times a week. Use as needed for itchy scalp Copland, Gwenlyn Found, MD  Active   levocetirizine (XYZAL) 5 MG tablet [id number] Yes TAKE 1 TABLET BY MOUTH EVERY DAY IN THE EVENING Copland, Gwenlyn Found, MD Taking Active   levothyroxine (SYNTHROID) 125 MCG tablet [id number] Yes TAKE 1 TABLET (125 MCG TOTAL) BY MOUTH IN THE MORNING  Patient taking differently: Take 125 mcg by mouth in the morning. 1/2 tablet   Copland, Gwenlyn Found, MD Taking Active   meclizine (ANTIVERT) 12.5 MG tablet [id number] Yes Take 12.5 mg by mouth 3 (three) times daily as needed for dizziness. [provider] Taking Active   metoprolol succinate (TOPROL-XL) 25 MG 24 hr tablet [id number] No TAKE 1/2 TABLET BY MOUTH DAILY  Patient not taking: Reported on 11/28/2022   Marinus Maw, MD Not Taking Active   modafinil (PROVIGIL) 200 MG tablet [id number] No Take 1 tablet (200 mg total) by mouth daily. Take in the morning  Patient not taking: Reported on 11/28/2022   Copland, Gwenlyn Found, MD Not Taking Active   OVER THE COUNTER MEDICATION [id number] Yes Therapain spray [provider] Taking Active   pantoprazole (PROTONIX) 40 MG tablet [id number] Yes Take 1 tablet (40 mg total) by mouth daily. Take 30 minutes before eating.  Patient taking differently: Take 40 mg by mouth daily as needed. Take 30 minutes before eating.   Copland, Gwenlyn Found, MD Taking Active   polyethylene glycol (MIRALAX / GLYCOLAX) 17 g packet [id number]  Take 17 g by mouth daily as needed for mild constipation. [provider]  Active   Polyvinyl Alcohol-Povidone (REFRESH OP) [id number] Yes Place 1 drop into both eyes daily as needed (dry eyes). [provider] Taking Active Self  simvastatin (ZOCOR) 20 MG tablet [id number] Yes TAKE 1 TABLET BY MOUTH EVERYDAY AT BEDTIME Copland, Gwenlyn Found, MD Taking Active   venlafaxine XR (EFFEXOR-XR) 75 MG 24 hr capsule [id number] Yes TAKE 1 CAPSULE BY MOUTH DAILY  WITH BREAKFAST. Copland, Gwenlyn Found, MD Taking Active   Wheat Dextrin [facility]) POWD [id number]  Take 1-2 Scoops by mouth as needed (constipation). (Take 1 to 2 teaspoons each morning mixed with beverage of choice) [provider]  Active               Assessment/Plan:   Fatigue / Dizziness  Reviewed current medication list for possible causes.  Venlafaxine has about a 15 to 22% reported incidence of fatigue Levocetirizine has about 5 to 6 % reported incidence of fatigue  Will discuss with PCP. Could try changing venlafaxine to either escitalopram or duloxetine. No cross taper needed to change from Venlafaxine 75mg  to either escitalopram or duloxetine. If she starts duloxetine, monitor for changes in sleep. Duloxetine can help with daytime energy but also can affect sleep in a few patients .  Instructed patient to start  checking blood pressure at home to see if need to restart metoprolol  Continue to hold donepezil for now. Can consider restarting again later since  patient did not see any change in fatigue / dizziness though when used with metoprolol it can increase serum level of metoprolol which could lead to lower blood pressure.    Henrene Pastor, PharmD Clinical Pharmacist [facility]

## 2022-11-29 MED ORDER — ESCITALOPRAM OXALATE 10 MG PO TABS: 10.0000 mg | ORAL_TABLET | Freq: Every day | ORAL | 3 refills | Status: DC

## 2022-12-02 DIAGNOSIS — E039 Hypothyroidism, unspecified: Secondary | ICD-10-CM | POA: Diagnosis not present

## 2022-12-02 DIAGNOSIS — F0284 Dementia in other diseases classified elsewhere, unspecified severity, with anxiety: Secondary | ICD-10-CM | POA: Diagnosis not present

## 2022-12-02 DIAGNOSIS — F331 Major depressive disorder, recurrent, moderate: Secondary | ICD-10-CM | POA: Diagnosis not present

## 2022-12-02 DIAGNOSIS — I110 Hypertensive heart disease with heart failure: Secondary | ICD-10-CM | POA: Diagnosis not present

## 2022-12-02 DIAGNOSIS — F0283 Dementia in other diseases classified elsewhere, unspecified severity, with mood disturbance: Secondary | ICD-10-CM | POA: Diagnosis not present

## 2022-12-02 DIAGNOSIS — F419 Anxiety disorder, unspecified: Secondary | ICD-10-CM | POA: Diagnosis not present

## 2022-12-02 DIAGNOSIS — I495 Sick sinus syndrome: Secondary | ICD-10-CM | POA: Diagnosis not present

## 2022-12-02 DIAGNOSIS — J449 Chronic obstructive pulmonary disease, unspecified: Secondary | ICD-10-CM | POA: Diagnosis not present

## 2022-12-02 DIAGNOSIS — I509 Heart failure, unspecified: Secondary | ICD-10-CM | POA: Diagnosis not present

## 2022-12-02 DIAGNOSIS — E785 Hyperlipidemia, unspecified: Secondary | ICD-10-CM | POA: Diagnosis not present

## 2022-12-02 DIAGNOSIS — G8929 Other chronic pain: Secondary | ICD-10-CM | POA: Diagnosis not present

## 2022-12-02 DIAGNOSIS — E669 Obesity, unspecified: Secondary | ICD-10-CM | POA: Diagnosis not present

## 2022-12-06 NOTE — Progress Notes (Signed)
Remote pacemaker transmission.   

## 2022-12-07 ENCOUNTER — Telehealth: Payer: Self-pay | Admitting: Internal Medicine

## 2022-12-07 NOTE — Telephone Encounter (Signed)
  Graciella Freer, PA-C 12/06/2022 10:17 AM EST     Please apologize for the delay but the formal reading just came today.   Her monitor is reassuring. The times she marked with symptoms were at times associated with extra beats, but at other times were marked during normal heart rate/rhythm and normal pacemaker function.   I see she has come off of Metoprolol now, and she was somewhat orthostatic in the office. Please have her let us know if she is feeling better or worse off of Metoprolol.   Would recommend compression garments (thigh sleeves or abdominal binder/spanx) for orthostatic hypotension.  Her PCP may consider other medications like midodrine, but would defer to them.    The patient has been notified of the result and verbalized understanding.  All questions (if any) were answered.  Pt states her pressures have gone up since stopping the metoprolol, but she doesn't have her numbers on her at this time to provide, for she is out of town for the holiday.  Pt states the nurse at [facility] takes her BP/HR twice a week and records this.  She said when she returns to [facility] this weekend, she will get the numbers from the nurse, and send Mardelle Matte a Ring[place] or call on next Monday.  Pt states she is still feeling very exhausted all the time, but voiced no other cardiac complaints like dizziness or palpitations.  Advised the pt to start wearing compression stockings, thigh sleeves, or abdominal binder/spanx during the day to help with orthostasis.  Provided her information to stocking outlet store in [employer] [place]), if she would like to go there and have someone properly fit her for these.   Pt aware that I will send this information back to [facility], to make him aware that she will be sending him some readings on next Monday and to let him know that she is still feeling fatigued.   Advised her to make sure and get in with her PCP as well, for they may want to  consider medication like midodrine, if indicated.   Pt verbalized understanding and agrees with this plan.

## 2022-12-07 NOTE — Telephone Encounter (Signed)
Patient is returning CMA's call for her heart monitor results.   Please advise.

## 2022-12-08 ENCOUNTER — Encounter: Payer: Self-pay | Admitting: Pharmacist

## 2022-12-30 ENCOUNTER — Ambulatory Visit
Admission: RE | Admit: 2022-12-30 | Discharge: 2022-12-30 | Disposition: A | Discharge status: Home / Self Care | Payer: PPO | Source: Ambulatory Visit | Attending: Family Medicine | Admitting: Family Medicine

## 2022-12-30 ENCOUNTER — Ambulatory Visit: Payer: PPO

## 2022-12-30 DIAGNOSIS — Z1231 Encounter for screening mammogram for malignant neoplasm of breast: Secondary | ICD-10-CM

## 2023-01-05 ENCOUNTER — Encounter: Payer: Self-pay | Admitting: Family Medicine

## 2023-01-05 DIAGNOSIS — M810 Age-related osteoporosis without current pathological fracture: Secondary | ICD-10-CM

## 2023-01-06 ENCOUNTER — Telehealth: Payer: Self-pay

## 2023-01-06 MED ORDER — DENOSUMAB 60 MG/ML ~~LOC~~ SOSY
60.0000 mg | PREFILLED_SYRINGE | Freq: Once | SUBCUTANEOUS | Status: AC
  Administered 2023-02-09: 60 mg via SUBCUTANEOUS

## 2023-01-06 NOTE — Telephone Encounter (Signed)
Pt reached out via Mychart asking about her injection.   Last was Done  03/24/2022, was due 09/24/22- overdue ([facility] staff was not informed that injection was administered in March). Urgent CAM placed urgent today 01/06/23. Please check EOB and costs.

## 2023-01-11 HISTORY — PX: TOTAL SHOULDER REPLACEMENT: SUR1217

## 2023-01-23 ENCOUNTER — Ambulatory Visit: Payer: Self-pay | Admitting: Family Medicine

## 2023-01-23 NOTE — Telephone Encounter (Signed)
 Chief Complaint: HTN Symptoms: High blood pressure episodes that are accompanied by high HR and brief lightheadedness, sweatiness Frequency: intermittent Disposition: [] ED /[] Urgent Care (no appt availability in office) / [x] Appointment(In office/virtual)/ []  [facility]/ [] Home Care/ [] Refused Recommended Disposition /[] [facility]/ []  Follow-up with PCP Additional Notes: 83 y/o female patient called in stating has been having a few episodes returning of heart racing, brief lightheadedness, sweaty and high bp. Patient states she has been evaluated by cardiologist and PCP and no findings but these episodes are continuing. This RN assessed patient's anxiety levels during these times. Patient states as a matter of fact, I did find out my friend had gone to the hospital today and I had one of those today. Patient's most recent BP was 150/93, taken while on the phone with RN. Patient would like follow up appointment with PCP to determine follow up POC for these symptoms, and to discuss medication management to ensure she is on all of the correct medications. In-office appointment scheduled for patient. Advised patient to be seen at the clinic inside of [facility] place of residence if BP increases or symptoms worsen.    Copied from CRM [id number]. Topic: Clinical - Red Word Triage >> Jan 23, 2023 10:59 AM Isabell A wrote: Reason for CRM: Patient states she's been seen for light headedness in the past and her episodes are back again. Experiencing this morning, she became sweaty, heart racing, blood pressure was 164/106 with a pulse of 85. Most recent blood pressure reading was 153/93 with a pulse of 83. Reason for Disposition  [1] Taking BP medications AND [2] feels is having side effects (e.g., impotence, cough, dizzy upon standing)  Answer Assessment - Initial Assessment Questions 1. BLOOD PRESSURE: What is the blood pressure? Did you take at least two  measurements 5 minutes apart?     150/93 2. ONSET: When did you take your blood pressure?     Just now on the phone - reading of 150/93 3. HOW: How did you take your blood pressure? (e.g., automatic home BP monitor, visiting nurse)     Sitting with automatic cuff 4. HISTORY: Do you have a history of high blood pressure?     yes 5. MEDICINES: Are you taking any medicines for blood pressure? Have you missed any doses recently?     Metoprolol  succinate 6. OTHER SYMPTOMS: Do you have any symptoms? (e.g., blurred vision, chest pain, difficulty breathing, headache, weakness)     Lightheadedness briefly  Protocols used: Blood Pressure - High-A-AH

## 2023-01-24 NOTE — Patient Instructions (Addendum)
 It was good to see you again today!  Let's have you go back on metoprolol xl 25 mg HALF tablet daily.  Please let me know how things look as far as your symptoms and your BP in a few days- we might need to go to a full tablet again

## 2023-01-24 NOTE — Progress Notes (Signed)
 [facility] [address], [address] [place], [place] [address] [phone]  Date:  01/25/2023   Name:  Theresa Mejia   DOB:  1940/12/28   MRN:  [id number]  PCP:  Kaylee Partridge, MD    Chief Complaint: Hypertension (Pt has episodes of Sweating, palpitations, lightheadedness, and increased BP./Her BP today was 187/111 on her automatic cuff. Nurse checked yesterday: 132/80. She did notice that when she stopped the Metoprolol  around nov 1st is when her BP started creeping up. )   History of Present Illness:  Theresa Mejia is a 83 y.o. very pleasant female patient who presents with the following:  Patient seen today with concern of feeling lightheaded and having elevated blood pressures Most recent visit with myself was in November History of hypothyroidism, essential hypertension Neurology is seeing her for mild cognitive impairment with memory loss, most recent visit October 2-she is not thought to have Alzheimer's disease due to negative biomarkers There may be some element of sleep disorder as well  I planned to start her on modafinil  late last fall due to chronic fatigue but patient did not end up starting due to concern about side effects.  She notes unusual episodes for 2 months or so- may occur 6x a day or so, occurring most days but not every day The only triggger she can possibly see is exercise-symptoms may occur more frequently when she is walking but certainly also can occur at rest (she clarifies that she is not doing vigorous exercise at this time, she is referring to walking around her home or the local environment)  She describes a sensation of tachycardia which may last a second or two- then this stops and she feels weak and pale, may have sweating - these after effects may last maybe 5 minutes and then resolve No HA No CP No SOB  Patient also notes her blood pressure is sometimes elevated at home.  She has had it  checked at the community nurse a few times, it is sometimes elevated and sometimes not.  We attempted to confirm her blood pressure using her cuff here in the clinic today but it gave an error message  She went off metoprolol  at the beginning of November as she was concerned about fatigue- however since coming off it she is having these strange symptoms and possible tachycardia.  She would be willing to go back on metoprolol  at this time  Theresa Mejia admits adjusting to her assisted living facility has been a challenge.  She was looking forward to shedding the responsibilities of shopping, cooking, etc. but now finds herself with too much free time and possibly increased anxiety.  She does wonder if anxiety is contributing to her symptoms  She completed a heart monitor this past November-notes per cardiology: Her monitor is reassuring. The times she marked with symptoms were at times associated with extra beats, but at other times were marked during normal heart rate/rhythm and normal pacemaker function.   I see she has come off of Metoprolol  now, and she was somewhat orthostatic in the office. Please have her let us  know if she is feeling better or worse off of Metoprolol .  See cardiology note dated 11/08/2022-Quanetta actually had complained of similar symptoms at that time  Patient Active Problem List   Diagnosis Date Noted   Amnestic MCI (mild cognitive impairment with memory loss) 10/12/2022   Major neurocognitive disorder due to unknown etiology (HCC)  06/22/2022   Memory loss or impairment 06/22/2022   Family history of Alzheimer's disease 06/22/2022   Lumbar radiculopathy 12/06/2020   Pain of left hip joint 11/11/2020   Pacemaker 08/25/2020   Osteoarthritis of carpometacarpal (CMC) joint of thumb 01/16/2019   Pain in right hand 01/16/2019   Trigger finger of right hand 01/16/2019   Osteopenia 01/09/2019   Pansinusitis 03/06/2018   Orthostatic hypotension 12/06/2016   Sinus node dysfunction  (HCC) 11/11/2015   Routine general medical examination at a health care facility 06/04/2015   Insomnia with sleep apnea 06/04/2015   Bronchitis, chronic obstructive w acute bronchitis (HCC) 12/10/2014   Lumbar disc disease 09/01/2010   Obstructive sleep apnea 05/31/2007   Hyperlipidemia with target LDL less than 130 02/05/2007   Depression with anxiety 02/05/2007   Allergic rhinitis 02/05/2007   Hypothyroidism 02/02/2007   Essential hypertension 02/02/2007    Past Medical History:  Diagnosis Date   Allergic rhinitis    Depression    DJD (degenerative joint disease)    GERD (gastroesophageal reflux disease)    Headache(784.0)    sinus   Hypercholesteremia    Hypersomnia    Hypertension    Hypothyroidism    Internal hemorrhoid 11/2008   s/p banding ([place])   Lumbar disc disease    OBSTRUCTIVE SLEEP APNEA    NPSG 04/29/07- AHI 1.1/hr, RDI 21.2/hr. Weight 176 lbs. CPAP was tried based on the RDI.     OVERACTIVE BLADDER    pt unaware   Presence of permanent cardiac pacemaker 11/11/2015   Reactive depression (situational)    Sinus node dysfunction (HCC)    Spondylosis, cervical     Past Surgical History:  Procedure Laterality Date   BIOPSY BREAST     BREAST EXCISIONAL BIOPSY Left 2011   CATARACT EXTRACTION W/ INTRAOCULAR LENS IMPLANT Right    COLONOSCOPY     EP IMPLANTABLE DEVICE N/A 11/11/2015   Procedure: Pacemaker Implant;  Surgeon: Tammie Fall, MD;  Location: [facility] INVASIVE CV LAB;  Service: Cardiovascular;  Laterality: N/A;   eye lid lift     INSERT / REPLACE / REMOVE PACEMAKER  11/11/2015   KNEE ARTHROSCOPY     Dr. Rossie Coon   MOUTH SURGERY  08/25/2017   implant fell out due to bone loss   REVERSE SHOULDER ARTHROPLASTY Right 08/08/2019   Procedure: REVERSE SHOULDER ARTHROPLASTY;  Surgeon: Sammye Cristal, MD;  Location: [facility] ORS;  Service: Orthopedics;  Laterality: Right;   rotator cuff surgery Right    Dr. Lorena Rolling   TUBAL LIGATION      Social History   Tobacco  Use   Smoking status: Former    Current packs/day: 0.00    Types: Cigarettes    Start date: 01/10/1958    Quit date: 01/10/1966    Years since quitting: 57.0   Smokeless tobacco: Never  Vaping Use   Vaping status: Never Used  Substance Use Topics   Alcohol use: No   Drug use: No    Family History  Problem Relation Age of Onset   Macular degeneration Mother    Hypothyroidism Mother    Tremor Mother    Stroke Mother 103   Crohn's disease Father 37   Alzheimer's disease Paternal Aunt    Alzheimer's disease Paternal Uncle    Parkinsonism Maternal Grandmother    Diabetes Paternal Grandmother    Colon cancer Neg Hx    Esophageal cancer Neg Hx    Rectal cancer Neg Hx     Allergies  Allergen Reactions   Ramipril     Unknown reaction   Sulfa Antibiotics     Says it dropped her blood pressure really low.    Sulfonamide Derivatives     REACTION: hypotension    Medication list has been reviewed and updated.  Current Outpatient Medications on File Prior to Visit  Medication Sig Dispense Refill   calcium  carbonate (TUMS - DOSED IN MG ELEMENTAL CALCIUM ) 500 MG chewable tablet Chew 1 tablet by mouth 4 (four) times daily as needed for indigestion or heartburn.     Cholecalciferol (VITAMIN D ) 50 MCG (2000 UT) CAPS Take 1 capsule by mouth daily at 12 noon.     clonazePAM  (KLONOPIN ) 0.5 MG tablet TAKE 1/2 AT BEDTIME AS NEEDED FOR INSOMNIA, MAY INCREASE TO A WHOLE TABLET IF NEEDED 30 tablet 0   cyanocobalamin  (VITAMIN B12) 1000 MCG tablet Take 1,000 mcg by mouth 3 (three) times a week.     denosumab  (PROLIA ) 60 MG/ML SOSY injection Inject 60 mg into the skin every 6 (six) months.     diclofenac Sodium (VOLTAREN ARTHRITIS PAIN) 1 % GEL Apply topically 4 (four) times daily.     fluticasone  (FLONASE ) 50 MCG/ACT nasal spray SPRAY 2 SPRAYS INTO EACH NOSTRIL EVERY DAY 48 mL 3   guaifenesin  (HUMIBID E) 400 MG TABS tablet Take 400 mg by mouth daily.     ketoconazole  (NIZORAL ) 2 % shampoo Apply 1  Application topically 2 (two) times a week. Use as needed for itchy scalp 120 mL PRN   levocetirizine (XYZAL ) 5 MG tablet TAKE 1 TABLET BY MOUTH EVERY DAY IN THE EVENING 90 tablet 1   levothyroxine  (SYNTHROID ) 125 MCG tablet TAKE 1 TABLET (125 MCG TOTAL) BY MOUTH IN THE MORNING (Patient taking differently: Take 125 mcg by mouth in the morning. 1/2 tablet) 90 tablet 0   meclizine (ANTIVERT) 12.5 MG tablet Take 12.5 mg by mouth 3 (three) times daily as needed for dizziness.     OVER THE COUNTER MEDICATION Therapain spray     pantoprazole  (PROTONIX ) 40 MG tablet Take 1 tablet (40 mg total) by mouth daily. Take 30 minutes before eating. (Patient taking differently: Take 40 mg by mouth daily as needed. Take 30 minutes before eating.) 90 tablet 3   polyethylene glycol (MIRALAX / GLYCOLAX) 17 g packet Take 17 g by mouth daily as needed for mild constipation.     Polyvinyl Alcohol-Povidone (REFRESH OP) Place 1 drop into both eyes daily as needed (dry eyes).     simvastatin  (ZOCOR ) 20 MG tablet TAKE 1 TABLET BY MOUTH EVERYDAY AT BEDTIME 90 tablet 3   Wheat Dextrin (BENEFIBER) POWD Take 1-2 Scoops by mouth as needed (constipation). (Take 1 to 2 teaspoons each morning mixed with beverage of choice)     Current Facility-Administered Medications on File Prior to Visit  Medication Dose Route Frequency Provider Last Rate Last Admin   denosumab  (PROLIA ) injection 60 mg  60 mg Subcutaneous Once Tafari Humiston C, MD        Review of Systems:  As per HPI- otherwise negative.   Physical Examination: Vitals:   01/25/23 1403 01/25/23 1417  BP: (!) 138/90 (!) 138/90  Pulse:    Resp:    Temp:    SpO2:     Vitals:   01/25/23 1329  Weight: 181 lb 3.2 oz (82.2 kg)  Height: 5' (1.524 m)   Body mass index is 35.39 kg/m. Ideal Body Weight: Weight in (lb) to have BMI = 25: 127.7  GEN: no acute distress.  Mildly obese, looks well HEENT: Atraumatic, Normocephalic.  Ears and Nose: No external  deformity. CV: RRR, No M/G/R. No JVD. No thrill. No extra heart sounds. PULM: CTA B, no wheezes, crackles, rhonchi. No retractions. No resp. distress. No accessory muscle use. ABD: S, NT, ND, +BS. No rebound. No HSM. EXTR: No c/c/e PSYCH: Normally interactive. Conversant.    Assessment and Plan: Palpitations  Chronic fatigue - Plan: escitalopram  (LEXAPRO ) 10 MG tablet  Essential hypertension  Patient seen today with continued concern of palpitations and variable blood pressure.  We had her stop metoprolol  a few months ago due to fatigue, but it seems this may have worsened her palpitations.  At this time we will have her start back on Toprol -XL 12.5 mg daily.  She will let me know how this affects her symptoms as well as her blood pressure  She is using Lexapro  with some improvement, refilled this for her today   Signed Gates Kasal, MD

## 2023-01-25 ENCOUNTER — Ambulatory Visit (INDEPENDENT_AMBULATORY_CARE_PROVIDER_SITE_OTHER): Payer: PPO | Admitting: Family Medicine

## 2023-01-25 VITALS — BP 138/90 | HR 75 | Temp 97.8°F | Resp 18 | Ht 60.0 in | Wt 181.2 lb

## 2023-01-25 DIAGNOSIS — R5382 Chronic fatigue, unspecified: Secondary | ICD-10-CM | POA: Diagnosis not present

## 2023-01-25 DIAGNOSIS — I10 Essential (primary) hypertension: Secondary | ICD-10-CM | POA: Diagnosis not present

## 2023-01-25 DIAGNOSIS — R002 Palpitations: Secondary | ICD-10-CM

## 2023-01-25 MED ORDER — ESCITALOPRAM OXALATE 10 MG PO TABS: 10.0000 mg | ORAL_TABLET | Freq: Every day | ORAL | 3 refills | Status: DC

## 2023-01-25 NOTE — Telephone Encounter (Signed)
Checking for update?

## 2023-01-26 ENCOUNTER — Other Ambulatory Visit (HOSPITAL_COMMUNITY): Payer: Self-pay

## 2023-01-26 ENCOUNTER — Telehealth: Payer: Self-pay

## 2023-01-26 ENCOUNTER — Other Ambulatory Visit: Payer: Self-pay | Admitting: Family Medicine

## 2023-01-26 NOTE — Telephone Encounter (Signed)
Prolia VOB initiated via [email or web address]  Next Prolia inj DUE:  asap  Per test claim, Prolia via pharmacy has a copay of $250.00.

## 2023-02-01 ENCOUNTER — Encounter: Payer: Self-pay | Admitting: Family Medicine

## 2023-02-02 ENCOUNTER — Other Ambulatory Visit: Payer: Self-pay

## 2023-02-02 ENCOUNTER — Other Ambulatory Visit (HOSPITAL_COMMUNITY): Payer: Self-pay

## 2023-02-02 ENCOUNTER — Encounter (HOSPITAL_COMMUNITY): Payer: Self-pay

## 2023-02-02 MED ORDER — DENOSUMAB 60 MG/ML ~~LOC~~ SOSY
60.0000 mg | PREFILLED_SYRINGE | SUBCUTANEOUS | 0 refills | Status: DC
  Filled 2023-02-02: qty 180, fill #0
  Filled 2023-02-03 – 2023-02-07 (×2): qty 1, 180d supply, fill #0

## 2023-02-02 NOTE — Telephone Encounter (Signed)
Pt ready for scheduling for PROLIA on or after : 02/02/23  Out-of-pocket cost due at time of visit: $332  Number of injection/visits approved: ---  Primary: [employer] Prolia co-insurance: 20% Admin fee co-insurance: 0%  Secondary: --- Prolia co-insurance:  Admin fee co-insurance:   Medical Benefit Details: Date Benefits were checked: 01/26/23 Deductible: NO/ Coinsurance: 20%/ Admin Fee: 0%  Prior Auth: N/A PA# Expiration Date:   # of doses approved:  Pharmacy benefit: Copay $250 If patient wants fill through the pharmacy benefit please send prescription to: [employer]/RX [employer]VANCE, and include estimated need by date in rx notes. Pharmacy will ship medication directly to the office.  Patient NOT eligible for Prolia Copay Card. Copay Card can make patient's cost as little as $25. Link to apply: https://www.amgensupportplus.com/copay  ** This summary of benefits is an estimation of the patient's out-of-pocket cost. Exact cost may very based on individual plan coverage.

## 2023-02-02 NOTE — Telephone Encounter (Signed)
Pt will like for [name] to send to pharmacy.  Rx sent to [facility].  Advised pt to look out for call from them.  Appt scheduled for 02/09/23.

## 2023-02-02 NOTE — Addendum Note (Signed)
Addended by: Thelma Barge D on: 02/02/2023 10:09 AM   Modules accepted: Orders

## 2023-02-03 ENCOUNTER — Other Ambulatory Visit: Payer: Self-pay

## 2023-02-03 NOTE — Progress Notes (Signed)
Pharmacy Patient Advocate Encounter  Insurance verification completed.   The patient is insured through [employer] ADVANTAGE/RX ADVANCE   Ran test claim for Prolia. Currently a quantity of 1 ml is a 180 day supply and the co-pay is $250.  This test claim was processed through [facility]- copay amounts may vary at other pharmacies due to pharmacy/plan contracts, or as the patient moves through the different stages of their insurance plan.

## 2023-02-06 ENCOUNTER — Encounter: Payer: Self-pay | Admitting: Family Medicine

## 2023-02-06 ENCOUNTER — Other Ambulatory Visit (HOSPITAL_COMMUNITY): Payer: Self-pay

## 2023-02-06 ENCOUNTER — Telehealth: Payer: Self-pay

## 2023-02-06 NOTE — Telephone Encounter (Signed)
I chatted with Evalina Field who was the pharmacy rep who called.  I think the confusion is at her plan allows her to either purchase her own Prolia from the pharmacy, or we can bill it from our end  I will send a message to [facility]

## 2023-02-06 NOTE — Telephone Encounter (Signed)
Copied from CRM [id number]. Topic: Clinical - Request for Lab/Test Order >> Feb 06, 2023  2:23 PM Samuel Jester B wrote: Reason for CRM: Pt stated that she would like to schedule her Prolia shot for 02/09/23 at 10:30am again, she said she cancelled it an should have kept it on her schedule.

## 2023-02-06 NOTE — Telephone Encounter (Signed)
Prolia BIV in separate encounter.

## 2023-02-06 NOTE — Telephone Encounter (Signed)
Copied from CRM [id number]. Topic: General - Other >> Feb 06, 2023 11:23 AM Irine Seal wrote: Reason for CRM: patient received a call from someone claiming to be with a pharmacy, and that she would need to pay for her prolia injection with them before they could send it to the clinic for her upcoming appt, called clinic and they stated the patient would not need to pay for anything over the phone that the injection was at the clinic, so potential fraud. patient was very concerned how someone had access to her medical information . Wanted to make clinic aware in case this becomes a reoccurring issue there's a potential for an increase in calls

## 2023-02-06 NOTE — Telephone Encounter (Signed)
Called and spoke with patient.  Advised how the process works again and she still declined medication. "She stated that we have been hacked and something is not right."  Advised that I will forward this to PCP.

## 2023-02-07 ENCOUNTER — Other Ambulatory Visit (HOSPITAL_COMMUNITY): Payer: Self-pay

## 2023-02-07 ENCOUNTER — Encounter (HOSPITAL_COMMUNITY): Payer: Self-pay

## 2023-02-07 ENCOUNTER — Other Ambulatory Visit: Payer: Self-pay

## 2023-02-07 NOTE — Progress Notes (Signed)
Specialty Pharmacy Refill Coordination Note  Theresa Mejia is a 83 y.o. female contacted today regarding refills of specialty medication(s) No data recorded  Patient requested Courier to Provider Office   Delivery date: 02/08/23   Verified address: [address], [address], [place], [place]   Medication will be filled on 02/07/23.

## 2023-02-08 NOTE — Telephone Encounter (Signed)
Pharmacy sent over Prolia.  Put in fridge.

## 2023-02-09 ENCOUNTER — Ambulatory Visit: Payer: PPO

## 2023-02-09 ENCOUNTER — Other Ambulatory Visit: Payer: Self-pay | Admitting: Family Medicine

## 2023-02-09 ENCOUNTER — Telehealth: Payer: Self-pay

## 2023-02-09 DIAGNOSIS — M810 Age-related osteoporosis without current pathological fracture: Secondary | ICD-10-CM

## 2023-02-09 DIAGNOSIS — J301 Allergic rhinitis due to pollen: Secondary | ICD-10-CM

## 2023-02-09 MED ORDER — DENOSUMAB 60 MG/ML ~~LOC~~ SOSY
60.0000 mg | PREFILLED_SYRINGE | Freq: Once | SUBCUTANEOUS | Status: AC
  Administered 2023-08-10: 60 mg via SUBCUTANEOUS

## 2023-02-09 NOTE — Telephone Encounter (Signed)
-----   Message from [facility] Jerilee Hoh sent at 02/09/2023 10:56 AM EST ----- Prolia injection given today

## 2023-02-09 NOTE — Progress Notes (Signed)
Theresa Mejia is a 83 y.o. female presents to the office today for prolia injection, per physician's orders. Original order: Dr. Pablo Lawrence (med), 60 mg/ml (dose),  subcutaneous (route) was administered  left arm(location) today. Patient tolerated injection.  Patient next injection due: in 6 months, appt made No, patient will be scheduled once her benefits are checked in 6 months.   Wilford Corner

## 2023-02-09 NOTE — Telephone Encounter (Signed)
Prolia was done 02/09/23 Next is due 08/09/23 CAM placed.

## 2023-03-02 ENCOUNTER — Other Ambulatory Visit: Payer: Self-pay | Admitting: Family Medicine

## 2023-03-02 DIAGNOSIS — K219 Gastro-esophageal reflux disease without esophagitis: Secondary | ICD-10-CM

## 2023-03-03 MED ORDER — DENOSUMAB 60 MG/ML ~~LOC~~ SOSY
60.0000 mg | PREFILLED_SYRINGE | Freq: Once | SUBCUTANEOUS | Status: AC
  Administered 2023-02-09: 60 mg via SUBCUTANEOUS

## 2023-03-03 NOTE — Addendum Note (Signed)
Addended by: Wilford Corner on: 03/03/2023 02:55 PM   Modules accepted: Orders

## 2023-03-06 ENCOUNTER — Ambulatory Visit: Payer: PPO | Attending: Internal Medicine | Admitting: Internal Medicine

## 2023-03-06 ENCOUNTER — Encounter: Payer: Self-pay | Admitting: Internal Medicine

## 2023-03-06 VITALS — BP 136/90 | HR 82 | Ht 60.0 in | Wt 163.0 lb

## 2023-03-06 DIAGNOSIS — I495 Sick sinus syndrome: Secondary | ICD-10-CM

## 2023-03-06 NOTE — Progress Notes (Signed)
 HPI [name] returns today for ongoing evaluation of palpitations and chronotropic incompetence, s/p PPM insertion. She is a pleasant 83 yo woman who c/o fatigue, weakness and palpitations, and average HR of 48/min. She underwent PPM insertion several years ago. She denies chest pain. She has recently moved into an independent living facility. She admits to being more sedentary and has some fatigue and orthostatic symptoms. Also she notes some palpitations.  Allergies  Allergen Reactions   Ramipril     Unknown reaction   Sulfa Antibiotics     Says it dropped her blood pressure really low.    Sulfonamide Derivatives     REACTION: hypotension     Current Outpatient Medications  Medication Sig Dispense Refill   calcium carbonate (TUMS - DOSED IN MG ELEMENTAL CALCIUM) 500 MG chewable tablet Chew 1 tablet by mouth 4 (four) times daily as needed for indigestion or heartburn.     Cholecalciferol (VITAMIN D) 50 MCG (2000 UT) CAPS Take 1 capsule by mouth daily at 12 noon.     clonazePAM (KLONOPIN) 0.5 MG tablet TAKE 1/2 AT BEDTIME AS NEEDED FOR INSOMNIA, MAY INCREASE TO A WHOLE TABLET IF NEEDED 30 tablet 0   cyanocobalamin (VITAMIN B12) 1000 MCG tablet Take 1,000 mcg by mouth 3 (three) times a week.     denosumab (PROLIA) 60 MG/ML SOSY injection Inject 60 mg into the skin every 6 (six) months. Dx code: M81.0.  pt has appt on 02/09/23 1 mL 0   diclofenac Sodium (VOLTAREN ARTHRITIS PAIN) 1 % GEL Apply topically 4 (four) times daily.     escitalopram (LEXAPRO) 10 MG tablet Take 1 tablet (10 mg total) by mouth daily. 90 tablet 3   fluticasone (FLONASE) 50 MCG/ACT nasal spray SPRAY 2 SPRAYS INTO EACH NOSTRIL EVERY DAY 48 mL 3   guaifenesin (HUMIBID E) 400 MG TABS tablet Take 400 mg by mouth daily.     ketoconazole (NIZORAL) 2 % shampoo Apply 1 Application topically 2 (two) times a week. Use as needed for itchy scalp 120 mL PRN   levocetirizine (XYZAL) 5 MG tablet TAKE 1 TABLET BY MOUTH EVERY  DAY IN THE EVENING 90 tablet 1   levothyroxine (SYNTHROID) 125 MCG tablet Take 1 tablet (125 mcg total) by mouth daily before breakfast. 90 tablet 1   meclizine (ANTIVERT) 12.5 MG tablet Take 12.5 mg by mouth 3 (three) times daily as needed for dizziness.     metoprolol succinate (TOPROL-XL) 25 MG 24 hr tablet Take 12.5 mg by mouth daily.     OVER THE COUNTER MEDICATION Therapain spray     pantoprazole (PROTONIX) 40 MG tablet Take 1 tablet (40 mg total) by mouth daily. Take 30 minutes before eating. 90 tablet 1   polyethylene glycol (MIRALAX / GLYCOLAX) 17 g packet Take 17 g by mouth daily as needed for mild constipation.     Polyvinyl Alcohol-Povidone (REFRESH OP) Place 1 drop into both eyes daily as needed (dry eyes).     simvastatin (ZOCOR) 20 MG tablet TAKE 1 TABLET BY MOUTH EVERYDAY AT BEDTIME 90 tablet 3   Wheat Dextrin (BENEFIBER) POWD Take 1-2 Scoops by mouth as needed (constipation). (Take 1 to 2 teaspoons each morning mixed with beverage of choice)     Current Facility-Administered Medications  Medication Dose Route Frequency Provider Last Rate Last Admin   [START ON 07/10/2023] denosumab (PROLIA) injection 60 mg  60 mg Subcutaneous Once Copland, Gwenlyn Found, MD  Past Medical History:  Diagnosis Date   Allergic rhinitis    Depression    DJD (degenerative joint disease)    GERD (gastroesophageal reflux disease)    Headache(784.0)    sinus   Hypercholesteremia    Hypersomnia    Hypertension    Hypothyroidism    Internal hemorrhoid 11/2008   s/p banding ([place])   Lumbar disc disease    OBSTRUCTIVE SLEEP APNEA    NPSG 04/29/07- AHI 1.1/hr, RDI 21.2/hr. Weight 176 lbs. CPAP was tried based on the RDI.     OVERACTIVE BLADDER    pt unaware   Presence of permanent cardiac pacemaker 11/11/2015   Reactive depression (situational)    Sinus node dysfunction (HCC)    Spondylosis, cervical     ROS:   All systems reviewed and negative except as noted in the HPI.   Past  Surgical History:  Procedure Laterality Date   BIOPSY BREAST     BREAST EXCISIONAL BIOPSY Left 2011   CATARACT EXTRACTION W/ INTRAOCULAR LENS IMPLANT Right    COLONOSCOPY     EP IMPLANTABLE DEVICE N/A 11/11/2015   Procedure: Pacemaker Implant;  Surgeon: Marinus Maw, MD;  Location: [facility] INVASIVE CV LAB;  Service: Cardiovascular;  Laterality: N/A;   eye lid lift     INSERT / REPLACE / REMOVE PACEMAKER  11/11/2015   KNEE ARTHROSCOPY     Dr. Despina Hick   MOUTH SURGERY  08/25/2017   implant fell out due to bone loss   REVERSE SHOULDER ARTHROPLASTY Right 08/08/2019   Procedure: REVERSE SHOULDER ARTHROPLASTY;  Surgeon: Jones Broom, MD;  Location: [facility] ORS;  Service: Orthopedics;  Laterality: Right;   rotator cuff surgery Right    Dr. Teressa Senter   TUBAL LIGATION       Family History  Problem Relation Age of Onset   Macular degeneration Mother    Hypothyroidism Mother    Tremor Mother    Stroke Mother 54   Crohn's disease Father 13   Alzheimer's disease Paternal Aunt    Alzheimer's disease Paternal Uncle    Parkinsonism Maternal Grandmother    Diabetes Paternal Grandmother    Colon cancer Neg Hx    Esophageal cancer Neg Hx    Rectal cancer Neg Hx      Social History   Socioeconomic History   Marital status: Married    Spouse name: Not on file   Number of children: 2   Years of education: Not on file   Highest education level: Bachelor's degree (e.g., BA, AB, BS)  Occupational History   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  Tobacco Use   Smoking status: Former    Current packs/day: 0.00    Types: Cigarettes    Start date: 01/10/1958    Quit date: 01/10/1966    Years since quitting: 57.1   Smokeless tobacco: Never  Vaping Use   Vaping status: Never Used  Substance and Sexual Activity   Alcohol use: No   Drug use: No   Sexual activity: Never    Birth control/protection: Post-menopausal  Other Topics Concern   Not on  file  Social History Narrative   married, lives with spouse -   Retired 05/2013   Social Drivers of Health   Financial Resource Strain: Low Risk  (01/25/2023)   Overall Financial Resource Strain (CARDIA)    Difficulty of Paying Living Expenses: Not hard at all  Food Insecurity: No Food Insecurity (01/25/2023)  Hunger Vital Sign    Worried About Running Out of Food in the Last Year: Never true    Ran Out of Food in the Last Year: Never true  Transportation Needs: No Transportation Needs (01/25/2023)   PRAPARE - Administrator, Civil Service (Medical): No    Lack of Transportation (Non-Medical): No  Physical Activity: Insufficiently Active (01/25/2023)   Exercise Vital Sign    Days of Exercise per Week: 2 days    Minutes of Exercise per Session: 10 min  Stress: No Stress Concern Present (01/25/2023)   [employer] of Occupational Health - Occupational Stress Questionnaire    Feeling of Stress : Not at all  Social Connections: Socially Integrated (01/25/2023)   Social Connection and Isolation Panel [NHANES]    Frequency of Communication with Friends and Family: More than three times a week    Frequency of Social Gatherings with Friends and Family: More than three times a week    Attends Religious Services: More than 4 times per year    Active Member of [employer] or Organizations: Yes    Attends Banker Meetings: 1 to 4 times per year    Marital Status: Married  Catering manager Violence: Not At Risk (11/17/2020)   Humiliation, Afraid, Rape, and Kick questionnaire    Fear of Current or Ex-Partner: No    Emotionally Abused: No    Physically Abused: No    Sexually Abused: No     BP (!) 136/90   Pulse 82   Ht 5' (1.524 m)   Wt 163 lb (73.9 kg)   SpO2 98%   BMI 31.83 kg/m   Physical Exam:  Well appearing NAD HEENT: Unremarkable Neck:  No JVD, no thyromegally Lymphatics:  No adenopathy Back:  No CVA tenderness Lungs:  Clear with no wheezes HEART:   Regular rate rhythm, no murmurs, no rubs, no clicks Abd:  soft, positive bowel sounds, no organomegally, no rebound, no guarding Ext:  2 plus pulses, no edema, no cyanosis, no clubbing Skin:  No rashes no nodules Neuro:  CN II through XII intact, motor grossly intact  DEVICE  Normal device function.  See PaceArt for details.   Assess/Plan:  1. Sinus node dysfunction - she is stable and asymptomatic, s/p PPM insertion. 2. PPM - her Biotronik DDD PM is working normally. Will recheck in several months. 3. HTN - her BP is fairly well controlled. No change.  4. Palpitations - review of her PM interrogation demonstrates several very brief mode switches. I encouraged watchful waiting.      Sharlot Gowda Cait Locust,MD

## 2023-03-06 NOTE — Patient Instructions (Signed)

## 2023-03-14 ENCOUNTER — Other Ambulatory Visit: Payer: Self-pay | Admitting: Family Medicine

## 2023-03-14 DIAGNOSIS — M25551 Pain in right hip: Secondary | ICD-10-CM

## 2023-03-14 DIAGNOSIS — M545 Low back pain, unspecified: Secondary | ICD-10-CM

## 2023-03-29 DIAGNOSIS — M19012 Primary osteoarthritis, left shoulder: Secondary | ICD-10-CM | POA: Diagnosis not present

## 2023-03-29 DIAGNOSIS — M25511 Pain in right shoulder: Secondary | ICD-10-CM | POA: Diagnosis not present

## 2023-04-03 DIAGNOSIS — H04123 Dry eye syndrome of bilateral lacrimal glands: Secondary | ICD-10-CM | POA: Diagnosis not present

## 2023-04-03 DIAGNOSIS — Z961 Presence of intraocular lens: Secondary | ICD-10-CM | POA: Diagnosis not present

## 2023-04-03 DIAGNOSIS — H524 Presbyopia: Secondary | ICD-10-CM | POA: Diagnosis not present

## 2023-04-03 DIAGNOSIS — H2512 Age-related nuclear cataract, left eye: Secondary | ICD-10-CM | POA: Diagnosis not present

## 2023-04-10 ENCOUNTER — Encounter: Payer: Self-pay | Admitting: Family Medicine

## 2023-05-02 ENCOUNTER — Encounter: Payer: Self-pay | Admitting: Family Medicine

## 2023-05-02 MED ORDER — ESCITALOPRAM OXALATE 20 MG PO TABS: 20.0000 mg | ORAL_TABLET | Freq: Every day | ORAL | 3 refills | Status: AC

## 2023-05-10 ENCOUNTER — Other Ambulatory Visit: Payer: Self-pay | Admitting: Family Medicine

## 2023-05-10 DIAGNOSIS — E785 Hyperlipidemia, unspecified: Secondary | ICD-10-CM

## 2023-05-15 ENCOUNTER — Telehealth: Payer: Self-pay

## 2023-05-15 NOTE — Telephone Encounter (Signed)
   Pre-operative Risk Assessment    Patient Name: Theresa Mejia  DOB: [birth year] MRN: [id number]   Date of last office visit: 03/06/23 [name], MD Date of next office visit: NONE   Request for Surgical Clearance    Procedure:   LEFT REVERSE TOTAL SHOULDER ARTHROPLASTY  Date of Surgery:  Clearance 07/04/23                                Surgeon:  JUSTIN W. Deeann Fare, MD Surgeon's Group or Practice Name:  [facility] Phone number:  [phone] Fax number:  [phone] ATTN: Katharina Palin   Type of Clearance Requested:   - Medical    Type of Anesthesia:   CHOICE   Additional requests/questions:    SignedCollin Deal   05/15/2023, 12:17 PM

## 2023-05-16 ENCOUNTER — Telehealth: Payer: Self-pay | Admitting: *Deleted

## 2023-05-16 NOTE — Telephone Encounter (Signed)
 I s/w the pt and she has been scheduled tele preop appt 06/19/23. Med rec and consent are done.

## 2023-05-16 NOTE — Telephone Encounter (Signed)
 Primary Cardiologist:None   Preoperative team, please contact this patient and set up a phone call appointment for further preoperative risk assessment. Please obtain consent and complete medication review. Thank you for your help.   I confirm that guidance regarding antiplatelet and oral anticoagulation therapy has been completed and, if necessary, noted below (none requested).  Clearance request has been routed to device clinic.   I also confirmed the patient resides in the state of [place] . As per [facility] Medical Board telemedicine laws, the patient must reside in the state in which the provider is licensed.   Gerldine Koch, NP-C  05/16/2023, 7:40 AM [address] [place], [place] [address] Office [phone] Fax [phone]

## 2023-05-16 NOTE — Progress Notes (Signed)
 PERIOPERATIVE PRESCRIPTION FOR IMPLANTED CARDIAC DEVICE PROGRAMMING   Patient Information:  Patient: Theresa Mejia  MRN: [id number]  Date of Birth: 10/07/1940   Procedure:   LEFT REVERSE TOTAL SHOULDER ARTHROPLASTY Date of Surgery:  Clearance 07/04/23                             Surgeon:  JUSTIN W. Deeann Fare, MD Surgeon's Group or Practice Name:  [facility] Phone number:  [phone] Fax number:  [phone] ATTN: Katharina Palin   Device Information:   Clinic EP Physician:   Dr. [name] Device Type:  Pacemaker Manufacturer and Phone #:  Biotronik: [phone] Pacemaker Dependent?:  No Date of Last Device Check:  03/07/23         Normal Device Function?:  Yes     Electrophysiologist's Recommendations:   Have magnet available. Provide continuous ECG monitoring when magnet is used or reprogramming is to be performed.  Procedure will likely interfere with device function.  Device should be programmed:  Asynchronous pacing during procedure and returned to normal programming after procedure  Per Device Clinic Standing Orders, Glorianne Largo  05/16/2023 9:25 AM

## 2023-05-16 NOTE — Telephone Encounter (Signed)
 I s/w the pt and she has been scheduled tele preop appt 06/19/23. Med rec and consent are done.      Patient Consent for Virtual Visit        Theresa Mejia has provided verbal consent on 05/16/2023 for a virtual visit (video or telephone).   CONSENT FOR VIRTUAL VISIT FOR:  Theresa Mejia  By participating in this virtual visit I agree to the following:  I hereby voluntarily request, consent and authorize [facility] and its employed or contracted physicians, physician assistants, nurse practitioners or other licensed health care professionals (the Practitioner), to provide me with telemedicine health care services (the "Services") as deemed necessary by the treating Practitioner. I acknowledge and consent to receive the Services by the Practitioner via telemedicine. I understand that the telemedicine visit will involve communicating with the Practitioner through live audiovisual communication technology and the disclosure of certain medical information by electronic transmission. I acknowledge that I have been given the opportunity to request an in-person assessment or other available alternative prior to the telemedicine visit and am voluntarily participating in the telemedicine visit.  I understand that I have the right to withhold or withdraw my consent to the use of telemedicine in the course of my care at any time, without affecting my right to future care or treatment, and that the Practitioner or I may terminate the telemedicine visit at any time. I understand that I have the right to inspect all information obtained and/or recorded in the course of the telemedicine visit and may receive copies of available information for a reasonable fee.  I understand that some of the potential risks of receiving the Services via telemedicine include:  Delay or interruption in medical evaluation due to technological equipment failure or disruption; Information transmitted may not be sufficient (e.g.  poor resolution of images) to allow for appropriate medical decision making by the Practitioner; and/or  In rare instances, security protocols could fail, causing a breach of personal health information.  Furthermore, I acknowledge that it is my responsibility to provide information about my medical history, conditions and care that is complete and accurate to the best of my ability. I acknowledge that Practitioner's advice, recommendations, and/or decision may be based on factors not within their control, such as incomplete or inaccurate data provided by me or distortions of diagnostic images or specimens that may result from electronic transmissions. I understand that the practice of medicine is not an exact science and that Practitioner makes no warranties or guarantees regarding treatment outcomes. I acknowledge that a copy of this consent can be made available to me via my patient portal [facility] MyChart), or I can request a printed copy by calling the office of [facility].    I understand that my insurance will be billed for this visit.   I have read or had this consent read to me. I understand the contents of this consent, which adequately explains the benefits and risks of the Services being provided via telemedicine.  I have been provided ample opportunity to ask questions regarding this consent and the Services and have had my questions answered to my satisfaction. I give my informed consent for the services to be provided through the use of telemedicine in my medical care

## 2023-05-16 NOTE — Telephone Encounter (Signed)
 Called patient, NA, VM box came on to leave a message, but then hung up before I could.

## 2023-05-17 ENCOUNTER — Encounter: Payer: Self-pay | Admitting: Internal Medicine

## 2023-05-17 ENCOUNTER — Ambulatory Visit (INDEPENDENT_AMBULATORY_CARE_PROVIDER_SITE_OTHER): Payer: PPO

## 2023-05-17 DIAGNOSIS — I495 Sick sinus syndrome: Secondary | ICD-10-CM

## 2023-05-17 LAB — CUP PACEART REMOTE DEVICE CHECK
Date Time Interrogation Session: 20250507084723
Implantable Lead Connection Status: 753985
Implantable Lead Connection Status: 753985
Implantable Lead Implant Date: 20171101
Implantable Lead Implant Date: 20171101
Implantable Lead Location: 753859
Implantable Lead Location: 753860
Implantable Lead Model: 377
Implantable Lead Model: 377
Implantable Lead Serial Number: 49553678
Implantable Lead Serial Number: 49617393
Implantable Pulse Generator Implant Date: 20171101
Pulse Gen Model: 394969
Pulse Gen Serial Number: 68817609

## 2023-05-18 ENCOUNTER — Other Ambulatory Visit: Payer: Self-pay | Admitting: Internal Medicine

## 2023-06-01 DIAGNOSIS — D225 Melanocytic nevi of trunk: Secondary | ICD-10-CM | POA: Diagnosis not present

## 2023-06-01 DIAGNOSIS — D2272 Melanocytic nevi of left lower limb, including hip: Secondary | ICD-10-CM | POA: Diagnosis not present

## 2023-06-01 DIAGNOSIS — L821 Other seborrheic keratosis: Secondary | ICD-10-CM | POA: Diagnosis not present

## 2023-06-01 DIAGNOSIS — L218 Other seborrheic dermatitis: Secondary | ICD-10-CM | POA: Diagnosis not present

## 2023-06-06 LAB — HM AWV

## 2023-06-08 ENCOUNTER — Telehealth: Payer: Self-pay

## 2023-06-08 NOTE — Telephone Encounter (Signed)
 Copied from CRM [id number]. Topic: Clinical - Request for Lab/Test Order >> Jun 08, 2023 11:35 AM Adonis Hoot wrote: Reason for CRM: Patient is scheduled for shoulder surgery for June 24th.She was advised by [facility] Dr Deeann Fare to contact PCP to have labs done before surgery. Could lab orders be placed ?

## 2023-06-13 ENCOUNTER — Encounter: Payer: Self-pay | Admitting: Family Medicine

## 2023-06-13 NOTE — Telephone Encounter (Signed)
 Form completed yesterday and faxed back to [facility].

## 2023-06-19 ENCOUNTER — Telehealth: Payer: Self-pay | Admitting: Family Medicine

## 2023-06-19 ENCOUNTER — Ambulatory Visit: Attending: Cardiovascular Disease | Admitting: Emergency Medicine

## 2023-06-19 DIAGNOSIS — Z01810 Encounter for preprocedural cardiovascular examination: Secondary | ICD-10-CM

## 2023-06-19 DIAGNOSIS — Z01818 Encounter for other preprocedural examination: Secondary | ICD-10-CM

## 2023-06-19 NOTE — Progress Notes (Signed)
 Virtual Visit via Telephone Note   Because of IZZAH PASQUA co-morbid illnesses, she is at least at moderate risk for complications without adequate follow up.  This format is felt to be most appropriate for this patient at this time.  Due to technical limitations with video connection (technology), today's appointment will be conducted as an audio only telehealth visit, and Theresa Mejia verbally agreed to proceed in this manner.   All issues noted in this document were discussed and addressed.  No physical exam could be performed with this format.  Evaluation Performed:  Preoperative cardiovascular risk assessment _____________   Date:  06/19/2023   Patient ID:  Theresa Mejia, DOB 12/26/1940, MRN [id number] Patient Location:  Home Provider location:   Office  Primary Care Provider:  Kaylee Partridge, MD Primary Cardiologist:  None  Chief Complaint / Patient Profile   83 y.o. y/o female with a h/o sinus node dysfunction s/p PPM, hypertension, palpitations who is pending left reverse total shoulder arthroplasty on 07/04/2023 with [facility] and presents today for telephonic preoperative cardiovascular risk assessment.  History of Present Illness    Theresa Mejia is a 83 y.o. female who presents via audio/video conferencing for a telehealth visit today.  Pt was last seen in cardiology clinic on 03/06/2023 by Dr. Carolynne Citron.  At that time Theresa Mejia was doing well.  The patient is now pending procedure as outlined above. Since her last visit, she denies chest pain, shortness of breath, lower extremity edema, fatigue, palpitations, melena, hematuria, hemoptysis, diaphoresis, weakness, presyncope, syncope, orthopnea, and PND.  Today patient is doing well overall.  She is without any acute cardiovascular concerns or complaints at this time.  She is currently living at [facility].  She walks to and from the dining hall and up and down stairs without any limitation or anginal symptoms.   She tells me she used to participate in yoga frequently however with her shoulder injury she has had to quit.  She tells me that her shoulder injury has limited her activity level.  Overall she is still able to do work around the house without limitation.  She denies any chest pains or any exertional symptoms.  Overall she is able to complete greater than 4 METS.  Past Medical History    Past Medical History:  Diagnosis Date   Allergic rhinitis    Depression    DJD (degenerative joint disease)    GERD (gastroesophageal reflux disease)    Headache(784.0)    sinus   Hypercholesteremia    Hypersomnia    Hypertension    Hypothyroidism    Internal hemorrhoid 11/2008   s/p banding ([facility])   Lumbar disc disease    OBSTRUCTIVE SLEEP APNEA    NPSG 04/29/07- AHI 1.1/hr, RDI 21.2/hr. Weight 176 lbs. CPAP was tried based on the RDI.     OVERACTIVE BLADDER    pt unaware   Presence of permanent cardiac pacemaker 11/11/2015   Reactive depression (situational)    Sinus node dysfunction (HCC)    Spondylosis, cervical    Past Surgical History:  Procedure Laterality Date   BIOPSY BREAST     BREAST EXCISIONAL BIOPSY Left 2011   CATARACT EXTRACTION W/ INTRAOCULAR LENS IMPLANT Right    COLONOSCOPY     EP IMPLANTABLE DEVICE N/A 11/11/2015   Procedure: Pacemaker Implant;  Surgeon: Tammie Fall, MD;  Location: [facility];  Service: Cardiovascular;  Laterality: N/A;   eye lid lift  INSERT / REPLACE / REMOVE PACEMAKER  11/11/2015   KNEE ARTHROSCOPY     Dr. Rossie Coon   MOUTH SURGERY  08/25/2017   implant fell out due to bone loss   REVERSE SHOULDER ARTHROPLASTY Right 08/08/2019   Procedure: REVERSE SHOULDER ARTHROPLASTY;  Surgeon: Sammye Cristal, MD;  Location: [facility];  Service: Orthopedics;  Laterality: Right;   rotator cuff surgery Right    Dr. Lorena Rolling   TUBAL LIGATION      Allergies  Allergies  Allergen Reactions   Ramipril     Unknown reaction   Sulfa Antibiotics     Says it  dropped her blood pressure really low.    Sulfonamide Derivatives     REACTION: hypotension    Home Medications    Prior to Admission medications   Medication Sig Start Date End Date Taking? Authorizing Provider  calcium  carbonate (TUMS - DOSED IN MG ELEMENTAL CALCIUM ) 500 MG chewable tablet Chew 1 tablet by mouth 4 (four) times daily as needed for indigestion or heartburn.    [provider]  Cholecalciferol (VITAMIN D ) 50 MCG (2000 UT) CAPS Take 1 capsule by mouth daily at 12 noon.    [provider]  clonazePAM  (KLONOPIN ) 0.5 MG tablet TAKE 1/2 AT BEDTIME AS NEEDED FOR INSOMNIA, MAY INCREASE TO A WHOLE TABLET IF NEEDED 11/28/22   Copland, Skipper Dumas, MD  cyanocobalamin  (VITAMIN B12) 1000 MCG tablet Take 1,000 mcg by mouth 3 (three) times a week.    [provider]  denosumab  (PROLIA ) 60 MG/ML SOSY injection Inject 60 mg into the skin every 6 (six) months. Dx code: M81.0.  pt has appt on 02/09/23 02/02/23   Copland, Jessica C, MD  diclofenac Sodium (VOLTAREN ARTHRITIS PAIN) 1 % GEL Apply topically 4 (four) times daily.    [provider]  escitalopram  (LEXAPRO ) 20 MG tablet Take 1 tablet (20 mg total) by mouth daily. 05/02/23   Copland, Skipper Dumas, MD  fluticasone  (FLONASE ) 50 MCG/ACT nasal spray SPRAY 2 SPRAYS INTO EACH NOSTRIL EVERY DAY 06/07/22   Copland, Skipper Dumas, MD  guaifenesin  (HUMIBID E) 400 MG TABS tablet Take 400 mg by mouth daily.    [provider]  ketoconazole  (NIZORAL ) 2 % shampoo Apply 1 Application topically 2 (two) times a week. Use as needed for itchy scalp 09/01/22   Copland, Jessica C, MD  levocetirizine (XYZAL ) 5 MG tablet TAKE 1 TABLET BY MOUTH EVERY DAY IN THE EVENING 02/09/23   Copland, Skipper Dumas, MD  levothyroxine  (SYNTHROID ) 125 MCG tablet Take 1 tablet (125 mcg total) by mouth daily before breakfast. 01/26/23   Copland, Jessica C, MD  meclizine (ANTIVERT) 12.5 MG tablet Take 12.5 mg by mouth 3 (three) times daily as needed for  dizziness.    [provider]  metoprolol  succinate (TOPROL -XL) 25 MG 24 hr tablet TAKE 1/2 TABLET BY MOUTH DAILY 05/18/23   Tammie Fall, MD  OVER THE COUNTER MEDICATION Therapain spray    [provider]  pantoprazole  (PROTONIX ) 40 MG tablet Take 1 tablet (40 mg total) by mouth daily. Take 30 minutes before eating. 03/02/23   Copland, Jessica C, MD  polyethylene glycol (MIRALAX / GLYCOLAX) 17 g packet Take 17 g by mouth daily as needed for mild constipation.    [provider]  Polyvinyl Alcohol-Povidone (REFRESH OP) Place 1 drop into both eyes daily as needed (dry eyes).    [provider]  simvastatin  (ZOCOR ) 20 MG tablet Take 1 tablet (20 mg total) by  mouth at bedtime. 05/10/23   Copland, Jessica C, MD  Wheat Dextrin (BENEFIBER) POWD Take 1-2 Scoops by mouth as needed (constipation). (Take 1 to 2 teaspoons each morning mixed with beverage of choice)    [provider]    Physical Exam    Vital Signs:  Theresa Mejia does not have vital signs available for review today.  Given telephonic nature of communication, physical exam is limited. AAOx3. NAD. Normal affect.  Speech and respirations are unlabored.  Accessory Clinical Findings    None  Assessment & Plan    1.  Preoperative Cardiovascular Risk Assessment: According to the Revised Cardiac Risk Index (RCRI), her Perioperative Risk of Major Cardiac Event is (%): 0.4. Her Functional Capacity in METs is: 5.07 according to the Duke Activity Status Index (DASI). Therefore, based on ACC/AHA guidelines, patient would be at acceptable risk for the planned procedure without further cardiovascular testing.  The patient was advised that if she develops new symptoms prior to surgery to contact our office to arrange for a follow-up visit, and she verbalized understanding.  A copy of this note will be routed to requesting surgeon.  Time:   Today, I have spent 9 minutes with the patient with telehealth  technology discussing medical history, symptoms, and management plan.     Ava Boatman, NP  06/19/2023, 9:14 AM

## 2023-06-19 NOTE — Telephone Encounter (Signed)
 This pt needs lab orders I see a note about some sent over electronically but I don't see anything in the lab encounter.

## 2023-06-19 NOTE — Telephone Encounter (Signed)
**Note De-identified  [place] Obfuscation** Please advise 

## 2023-06-20 ENCOUNTER — Other Ambulatory Visit (INDEPENDENT_AMBULATORY_CARE_PROVIDER_SITE_OTHER)

## 2023-06-20 DIAGNOSIS — Z01818 Encounter for other preprocedural examination: Secondary | ICD-10-CM

## 2023-06-20 LAB — CBC
HCT: 40 % (ref 36.0–46.0)
Hemoglobin: 13.3 g/dL (ref 12.0–15.0)
MCHC: 33.4 g/dL (ref 30.0–36.0)
MCV: 87.5 fl (ref 78.0–100.0)
Platelets: 240 10*3/uL (ref 150.0–400.0)
RBC: 4.57 Mil/uL (ref 3.87–5.11)
RDW: 13.8 % (ref 11.5–15.5)
WBC: 5.7 10*3/uL (ref 4.0–10.5)

## 2023-06-20 LAB — BASIC METABOLIC PANEL WITH GFR
BUN: 20 mg/dL (ref 6–23)
CO2: 31 meq/L (ref 19–32)
Calcium: 9.4 mg/dL (ref 8.4–10.5)
Chloride: 102 meq/L (ref 96–112)
Creatinine, Ser: 0.78 mg/dL (ref 0.40–1.20)
GFR: 71.71 mL/min (ref >60.00)
Glucose, Bld: 89 mg/dL (ref 70–99)
Potassium: 4.2 meq/L (ref 3.5–5.1)
Sodium: 138 meq/L (ref 135–145)

## 2023-06-20 LAB — HEMOGLOBIN A1C: Hgb A1c MFr Bld: 5.6 % (ref 4.6–6.5)

## 2023-06-21 ENCOUNTER — Ambulatory Visit: Payer: Self-pay | Admitting: Family Medicine

## 2023-06-23 DIAGNOSIS — M19012 Primary osteoarthritis, left shoulder: Secondary | ICD-10-CM | POA: Diagnosis not present

## 2023-06-27 NOTE — Progress Notes (Signed)
 Remote pacemaker transmission.

## 2023-07-04 DIAGNOSIS — Z96612 Presence of left artificial shoulder joint: Secondary | ICD-10-CM | POA: Diagnosis not present

## 2023-07-04 DIAGNOSIS — G8918 Other acute postprocedural pain: Secondary | ICD-10-CM | POA: Diagnosis not present

## 2023-07-04 DIAGNOSIS — M19012 Primary osteoarthritis, left shoulder: Secondary | ICD-10-CM | POA: Diagnosis not present

## 2023-07-05 ENCOUNTER — Telehealth: Payer: Self-pay | Admitting: *Deleted

## 2023-07-05 NOTE — Telephone Encounter (Signed)
 Device alert received from [employer]:  RA lead failure detected (RA lead check) Switch of RA pacing to unipolar because of bipolar failure reported on Jul 05, 2023, 1:09:00 AM _____________________________________________________________________________________________ Noise noted on A&V leads with consistent AV pacing noted  It's possible that patient was in MRI or surgery and programmed to DOO or ODO and the noise is from cautery  Event noted to have taken place around 0730 yesterday morning  Called and spoke with patient regarding abnormal transmission and to assess for any symptoms  Patient stated they were having surgery at that exact time on alert which explains the noise on the leads and the pacing  Patient's device does not have Quick Check function, so we will have to review her daily transmission for rhythm normality tomorrow morning  No documentation of surgical event from yesterday noted in patient's EMR which prompted the phone call  Patient very appreciative of the phone call, but did voice her frustration at the apparent lack of communication between care groups in the system   All questions answered

## 2023-07-10 ENCOUNTER — Telehealth: Payer: Self-pay

## 2023-07-10 ENCOUNTER — Other Ambulatory Visit: Payer: Self-pay | Admitting: Family Medicine

## 2023-07-10 DIAGNOSIS — J301 Allergic rhinitis due to pollen: Secondary | ICD-10-CM

## 2023-07-10 NOTE — Telephone Encounter (Signed)
 Prolia  VOB initiated via [email or web address]  Next Prolia  inj DUE: 08/09/23

## 2023-07-11 ENCOUNTER — Other Ambulatory Visit (HOSPITAL_COMMUNITY): Payer: Self-pay

## 2023-07-11 NOTE — Telephone Encounter (Signed)
 Pt ready for scheduling for PROLIA  on or after : 08/09/23  Option# 1: Buy/Bill (Office supplied medication)  Out-of-pocket cost due at time of clinic visit: $332  Number of injection/visits approved: ---  Primary: [employer] Prolia  co-insurance: 20% Admin fee co-insurance: 0%  Secondary: --- Prolia  co-insurance:  Admin fee co-insurance:   Medical Benefit Details: Date Benefits were checked: 07/11/23 Deductible: NO/ Coinsurance: 20%/ Admin Fee: 0%  Prior Auth: N/A PA# Expiration Date:   # of doses approved: ----------------------------------------------------------------------- Option# 2- Med Obtained from pharmacy:  Pharmacy benefit: Copay $250 (Paid to pharmacy) Admin Fee: 0% (Pay at clinic)  Prior Auth: N/A PA# Expiration Date:   # of doses approved:   If patient wants fill through the pharmacy benefit please send prescription to: [employer], and include estimated need by date in rx notes. Pharmacy will ship medication directly to the office.  Patient NOT eligible for Prolia  Copay Card. Copay Card can make patient's cost as little as $25. Link to apply: [email or web address]://[email or web address]  ** This summary of benefits is an estimation of the patient's out-of-pocket cost. Exact cost may very based on individual plan coverage.

## 2023-07-12 ENCOUNTER — Other Ambulatory Visit: Payer: Self-pay

## 2023-07-12 ENCOUNTER — Other Ambulatory Visit: Payer: Self-pay | Admitting: *Deleted

## 2023-07-12 DIAGNOSIS — M810 Age-related osteoporosis without current pathological fracture: Secondary | ICD-10-CM

## 2023-07-12 MED ORDER — DENOSUMAB 60 MG/ML ~~LOC~~ SOSY
60.0000 mg | PREFILLED_SYRINGE | SUBCUTANEOUS | 0 refills | Status: AC
  Filled 2023-07-12 – 2023-07-31 (×2): qty 1, 180d supply, fill #0

## 2023-07-19 ENCOUNTER — Encounter: Payer: Self-pay | Admitting: Family Medicine

## 2023-07-19 DIAGNOSIS — M19012 Primary osteoarthritis, left shoulder: Secondary | ICD-10-CM | POA: Diagnosis not present

## 2023-07-24 DIAGNOSIS — M12512 Traumatic arthropathy, left shoulder: Secondary | ICD-10-CM | POA: Diagnosis not present

## 2023-07-24 DIAGNOSIS — M6281 Muscle weakness (generalized): Secondary | ICD-10-CM | POA: Diagnosis not present

## 2023-07-24 DIAGNOSIS — R278 Other lack of coordination: Secondary | ICD-10-CM | POA: Diagnosis not present

## 2023-07-25 ENCOUNTER — Other Ambulatory Visit: Payer: Self-pay

## 2023-07-27 ENCOUNTER — Encounter: Payer: Self-pay | Admitting: Psychology

## 2023-07-27 ENCOUNTER — Encounter: Payer: PPO | Attending: Psychology | Admitting: Psychology

## 2023-07-27 DIAGNOSIS — Z820 Family history of epilepsy and other diseases of the nervous system: Secondary | ICD-10-CM | POA: Diagnosis not present

## 2023-07-27 DIAGNOSIS — G3184 Mild cognitive impairment, so stated: Secondary | ICD-10-CM | POA: Diagnosis not present

## 2023-07-27 DIAGNOSIS — G4700 Insomnia, unspecified: Secondary | ICD-10-CM | POA: Insufficient documentation

## 2023-07-27 DIAGNOSIS — F418 Other specified anxiety disorders: Secondary | ICD-10-CM | POA: Insufficient documentation

## 2023-07-27 DIAGNOSIS — G4730 Sleep apnea, unspecified: Secondary | ICD-10-CM | POA: Insufficient documentation

## 2023-07-27 NOTE — Progress Notes (Signed)
 Neuropsychological Consultation   Patient:   Theresa Mejia   DOB:   [birth year]  MR Number:  [id number]  Location:  [facility] PHYSICAL MEDICINE AND REHABILITATION [address] [place], [address] [place] [address] Dept: [phone]           Date of Service:   07/27/2023  Location of Service and Individuals present: Today's visit was conducted in outpatient clinic office with the patient, her husband Lamar and myself present.  Start Time:   10 AM End Time:   12 PM  1 hour and 20 minutes were spent in formal face-to-face clinical interview and the other 45 minutes was spent with record review, report writing and setting up testing protocols.  Patient Consent and Confidentiality: The visit today included use of digital scribe software to facilitate an report writing and then if it is in the limits of this system were reviewed with patient and husband with both consenting to use of this system.  Limits of confidentiality regarding the referral of the patient for neuropsychological evaluation with formal report to be produced and provided to referring physician as well as be made available in the patient's electronic medical records were also reviewed and consented to.  Consent for Evaluation and Treatment:  Signed:  Yes Explanation of Privacy Policies:  Signed:  Yes Discussion of Confidentiality Limits:  Yes  Provider/Observer:  Norleen Asa, Psy.D.       Clinical Neuropsychologist       Billing Code/Service: 96116/96121  Chief Complaint:     Chief Complaint  Patient presents with   Memory Loss   Anxiety   Agitation   Pain   Sleeping Problem    Reason for Service: Referred by Dedra Gores, MD for a neuropsychological evaluation due to concerns about memory difficulties. Reports problems with memory for some time, along with a long-standing history of sleep issues. Originally saw Quita Salt, MD for concerns about  sleep apnea, which was diagnosed years ago. Family, including children, has noticed memory issues. There is a family history of Alzheimer's disease on the paternal side (aunt and uncle). A paternal grandfather had diabetes, and a paternal grandmother had a Parkinson's-type condition. Reports no history of epilepsy, tremors, or hallucinations although later noted some tremor in hands when performing actions like holding things and not correlated to increased anxiety state. No significant changes in personality or behavioral style noted, apart from recent anxiety and some increased irritability. Reports increased fatigue and feeling tired. Sleep patterns are disturbed, with difficulty falling asleep. A previous diagnosis of obstructive sleep apnea was made approximately 20 years ago by Dr. Salt, a pulmonologist, who recommended a CPAP machine. The CPAP was tried for a short period but not tolerated. A second attempt with a newer CPAP machine for a couple of months was also unsuccessful. The diagnosis was mild to low-end moderate obstructive sleep apnea, with blood oxygen levels falling below 88%. The importance of treating sleep apnea was reiterated, including its impact on memory, and potential increase in relative risks of stroke, heart attack, and cancer. Prior cognitive screening includes two MoCA tests in 06/2022 and 10/2022, with stable scores of 24/30 on both occasions. Dr. Gores had prescribed Aricept , but it was not taken due to concerns about side effects.  Onset and Duration of Symptoms: The primary difficulties with memory loss have been identified over the past year. A significant event of geographic disorientation occurred about one year ago while trying  to drive to a friend's house. Reports that this memory loss has been noticed by both her and her husband, as well as their children.  Progression of Symptoms: The memory issues seem to come and go. The husband reports that forgotten information  is generally not critical, such as forgetting appointments or medications. However, he notes she may not recall recent events or conversations, such as seeing new construction two weeks prior. He feels the issues may be progressing, though sometimes attributes it to tiredness.  Additional Tests and Measures from other records:  Neuroimaging Results: A CT scan of the head on 07/24/2022 identified mild generalized cortical atrophy, slightly more pronounced in the medial temporal lobes. Some foci of hypoattenuation in the white matter, including two in the left anterior internal capsule, were noted, consistent with chronic microvascular ischemic change, but not considered dramatic for 83.  Laboratory Tests: Genetic studies for the APOE4 variant were negative, indicating no increased genetic risk for Alzheimer's type pathology. P-tau 181 studies showed no clinical elevation, a pattern also not associated with increased risk of Alzheimer's.  Sleep: Reports difficulty falling asleep and always feeling tired. Has a previous diagnosis of obstructive sleep apnea but is not currently using a CPAP machine.  Diet Pattern: Has a pretty decent diet and is being monitored by her doctors.  Behavioral Observation/Mental Status: Presents as a 83-year-old right-handed woman, appropriately dressed and cooperative. Participation and motivation for the evaluation were good. Attention and concentration appeared adequate. Recent memory is impaired, with reports of forgetting recent events and conversations. Remote memory appears largely intact. Visuo-spatial abilities are impacted, as evidenced by reports of geographic disorientation. Speech is fluent with normal volume and quality. Thought processes are coherent, relevant, and organized. Thought content is negative for suicidal or homicidal ideation. Orientation is intact to person, place, and situation. Judgment and planning appear intact for daily activities. Affect is appropriate.  Mood is described as good, though recent issues with anxiety have been reported, described as a feeling of impending doom, like having an exam without studying. Insight into memory difficulties is present. Intelligence appears to be in the average range.  Marital Status/Living: Born in [place]. Upbringing included two siblings. Pregnancy and delivery were reported as normal and uncomplicated. No significant childhood illnesses were noted. Currently lives with her husband of 59 years in an independent living facility, having moved there from a different part of [place] two years ago. Has one 41 year old son and one 34 year old daughter, both of whom are doing well.  Educational and Occupational History:  Highest Level of Education: Holds a bachelor's degree in education from the [employer] of [place] .  Educational and Career Achievements: Always performed well in English and psychology-like courses, with some relative weakness in physics and other advanced math classes. Played volleyball in high school.  Current Occupation: Retired.  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  Hobbies and Interests: Enjoys Geophysical data processor. Watches [employer] shows.  Psychiatric History: Has a history of some anxiety and depressive symptoms. Recently reports an increase in anxiety. Is actively considering seeking counseling and has received referrals.  History of Substance Use or Abuse: No concerns regarding substance use or abuse were raised.  Medical History:   Past Medical History:  Diagnosis Date   Allergic rhinitis    Depression    DJD (degenerative joint disease)    GERD (gastroesophageal reflux disease)    Headache(784.0)    sinus  Hypercholesteremia    Hypersomnia    Hypertension    Hypothyroidism    Internal hemorrhoid 11/2008   s/p banding ([facility])   Lumbar disc disease    OBSTRUCTIVE SLEEP APNEA     NPSG 04/29/07- AHI 1.1/hr, RDI 21.2/hr. Weight 176 lbs. CPAP was tried based on the RDI.     OVERACTIVE BLADDER    pt unaware   Presence of permanent cardiac pacemaker 11/11/2015   Reactive depression (situational)    Sinus node dysfunction (HCC)    Spondylosis, cervical          Patient Active Problem List   Diagnosis Date Noted   Amnestic MCI (mild cognitive impairment with memory loss) 10/12/2022   Major neurocognitive disorder due to unknown etiology (HCC) 06/22/2022   Memory loss or impairment 06/22/2022   Family history of Alzheimer's disease 06/22/2022   Lumbar radiculopathy 12/06/2020   Pain of left hip joint 11/11/2020   Pacemaker 08/25/2020   Osteoarthritis of carpometacarpal (CMC) joint of thumb 01/16/2019   Pain in right hand 01/16/2019   Trigger finger of right hand 01/16/2019   Osteopenia 01/09/2019   Pansinusitis 03/06/2018   Orthostatic hypotension 12/06/2016   Sinus node dysfunction (HCC) 11/11/2015   Routine general medical examination at a health care facility 06/04/2015   Insomnia with sleep apnea 06/04/2015   Bronchitis, chronic obstructive w acute bronchitis (HCC) 12/10/2014   Lumbar disc disease 09/01/2010   Obstructive sleep apnea 05/31/2007   Hyperlipidemia with target LDL less than 130 02/05/2007   Depression with anxiety 02/05/2007   Allergic rhinitis 02/05/2007   Hypothyroidism 02/02/2007   Essential hypertension 02/02/2007    Family Med/Psych History:  Family History  Problem Relation Age of Onset   Macular degeneration Mother    Hypothyroidism Mother    Tremor Mother    Stroke Mother 90   Crohn's disease Father 49   Alzheimer's disease Paternal Aunt    Alzheimer's disease Paternal Uncle    Parkinsonism Maternal Grandmother    Diabetes Paternal Grandmother    Colon cancer Neg Hx    Esophageal cancer Neg Hx    Rectal cancer Neg Hx    Impression/DX:   Presents for neuropsychological evaluation referred by Dr. Chalice due to memory  concerns. Primary complaints include memory loss over the past year, noted by self, husband, and children. A key event was an episode of geographic disorientation about a year ago. Reports repeated questioning and forgetting recent conversations and events. These difficulties do not currently impact essential activities of daily living like medication management or appointments. There is a family history of Alzheimer's disease. Memory symptoms appear stable but episodic. Functionally, she moved to an independent living facility two years ago, which reduced daily responsibilities. The impact of cueing on memory was not explicitly assessed in this interview.   Disposition/Plan:  A four-hour block of time will be scheduled for formal neuropsychological testing with the psychometrician to obtain a comprehensive assessment of cognitive functioning. This will include measures such as the Wechsler Adult Intelligence Scale, Wechsler Memory Scale, and a word association test. Following the testing, a formal report will be generated and sent to Dr. Chalice and the neurology team as well as being made available in the patient's electronic medical records for other appropriate medical professionals to have access to.  A feedback session will be scheduled to discuss the results. It was strongly recommended to contact Dr. Lionell office to reinvestigate and reconsider treatment for obstructive sleep apnea without waiting for the formal  report. Advised on seeking therapy for recent anxiety, with guidance on finding a suitable therapist, including the potential benefit of one specializing in geriatrics.   Diagnosis:    Amnestic MCI (mild cognitive impairment with memory loss)  Insomnia with sleep apnea  Family history of Alzheimer's disease  Depression with anxiety        Note: This document was prepared using Dragon voice recognition software and may include unintentional dictation errors.   Electronically  Signed   _______________________ Norleen Asa, Psy.D. Clinical Neuropsychologist

## 2023-07-31 ENCOUNTER — Other Ambulatory Visit: Payer: Self-pay

## 2023-07-31 ENCOUNTER — Telehealth: Payer: Self-pay | Admitting: Neurology

## 2023-07-31 NOTE — Telephone Encounter (Signed)
 Spoke with patient and she accepted 08/09/23 appt with Amy

## 2023-07-31 NOTE — Telephone Encounter (Signed)
 Patient calling to check on earlier appointment. Want to try again to use the CPAP machine. Sleep is worse, not getting a good night sleep and tired during the day. Would like a call back.  Informed patient nurse will call back only if have an earlier appointment available. Patient verbalized understand.

## 2023-08-02 ENCOUNTER — Other Ambulatory Visit: Payer: Self-pay | Admitting: Pharmacy Technician

## 2023-08-02 ENCOUNTER — Other Ambulatory Visit: Payer: Self-pay

## 2023-08-02 ENCOUNTER — Other Ambulatory Visit: Payer: Self-pay | Admitting: Family Medicine

## 2023-08-02 NOTE — Progress Notes (Signed)
 Specialty Pharmacy Refill Coordination Note  Theresa Mejia is a 83 y.o. female contacted today regarding refills of specialty medication(s) Denosumab  (PROLIA )   Patient requested Courier to Provider Office   Delivery date: 08/01/23   Verified address: [address], [address] [place] [address]   Medication will be filled on 07/31/23.   For appt on 7/30.

## 2023-08-04 ENCOUNTER — Encounter

## 2023-08-04 DIAGNOSIS — Z820 Family history of epilepsy and other diseases of the nervous system: Secondary | ICD-10-CM | POA: Diagnosis not present

## 2023-08-04 DIAGNOSIS — G3184 Mild cognitive impairment, so stated: Secondary | ICD-10-CM

## 2023-08-04 DIAGNOSIS — G4700 Insomnia, unspecified: Secondary | ICD-10-CM

## 2023-08-04 DIAGNOSIS — G4730 Sleep apnea, unspecified: Secondary | ICD-10-CM | POA: Diagnosis not present

## 2023-08-04 NOTE — Progress Notes (Signed)
 Behavioral Observations:  The patient appeared well-groomed and appropriately dressed. Her manners were polite and appropriate to the situation. Her attitude towards testing was positive and she demonstrated a good effort.  Neuropsychology Note  Theresa Mejia completed 130 minutes of neuropsychological testing with technician, Josue Ned, BA, under the supervision of Norleen Asa, PsyD., Clinical Neuropsychologist. The patient did not appear overtly distressed by the testing session, per behavioral observation or via self-report to the technician. Rest breaks were offered.   Clinical Decision Making: In considering the patient's current level of functioning, level of presumed impairment, nature of symptoms, emotional and behavioral responses during clinical interview, level of literacy, and observed level of motivation/effort, a battery of tests was selected by Dr. Asa during initial consultation on 07/27/2023. This was communicated to the technician. Communication between the neuropsychologist and technician was ongoing throughout the testing session and changes were made as deemed necessary based on patient performance on testing, technician observations and additional pertinent factors such as those listed above.  Tests Administered: Controlled Oral Word Association Test (COWAT; FAS & Animals)  Grooved Pegboard Wechsler Adult Intelligence Scale, 4th Edition (WAIS-IV) Wechsler Memory Scale, 4th Edition (WMS-IV); Older Adult Battery    Results:  COWAT: FAS total= 40 Z= 0.95 Animals total= 19 Z= 1.21  Grooved Pegboard: R (DH) time= 103s Drops= 0  Percentile Rank= 33rd L (NDH) time= 139s Drops= 0  Percentile Rank= 35th **Pt struggled slightly on L hand trial due to recent shoulder surgery.   WAIS-IV:    Composite Score Summary  Scale Sum of Scaled Scores Composite Score Percentile Rank 95% Conf. Interval Qualitative Description  Verbal Comprehension 31 VCI  102 55 96-108 Average  Perceptual Reasoning 34 PRI 107 68 100-113 Average  Working Memory 22 WMI 105 63 98-111 Average  Processing Speed 25 PSI 114 82 104-121 High Average  Full Scale 112 FSIQ 108 70 104-112 Average  General Ability 65 GAI 104 61 99-109 Average   Verbal Comprehension Subtests Summary  Subtest Raw Score Scaled Score Percentile Rank Reference Group Scaled Score SEM  Similarities 23 11 63 9 0.90  Vocabulary 39 11 63 11 0.73  Information 10 9 37 8 0.73  The scaled scores in the Reference Group Scaled Score column are based on the performance of examinees aged 20:0-34:11 (i.e., the reference group). See Chapter 6 of the WAIS-IV Technical and Interpretive Manual for more information.  Perceptual Reasoning Subtests Summary  Subtest Raw Score Scaled Score Percentile Rank Reference Group Scaled Score SEM  Block Design 28 11 63 6 1.34  Matrix Reasoning 10 10 50 5 1.12  Visual Puzzles 12 13 84 8 1.27   Working Librarian, academic Raw Score Scaled Score Percentile Rank Reference Group Scaled Score SEM  Digit Span 28 13 84 10 0.85  Arithmetic 11 9 37 8 0.99   Processing Speed Subtests Summary  Subtest Raw Score Scaled Score Percentile Rank Reference Group Scaled Score SEM  Symbol Search 26 13 84 7 1.12  Coding 51 12 75 6 1.12    WMS-IV:   Index Score Summary  Index Sum of Scaled Scores Index Score Percentile Rank 95% Confidence Interval Qualitative Descriptor  Auditory Memory (AMI) 40 100 50 94-106 Average  Visual Memory (VMI) 13 82 12 78-87 Low Average  Immediate Memory (IMI) 30 100 50 94-106 Average  Delayed Memory (DMI) 23 85 16 79-94 Low Average    Primary Subtest Scaled Score Summary  Subtest Domain Raw Score Scaled Score  Percentile Rank  Logical Memory I AM 32 12 75  Logical Memory II AM 17 11 63  Verbal Paired Associates I AM 14 8 25   Verbal Paired Associates II AM 4 9 37  Visual Reproduction I VM 25 10 50  Visual Reproduction II VM 0 3 1   Symbol Span VWM 11 9 37   Auditory Memory Process Score Summary  Process Score Raw Score Scaled Score Percentile Rank Cumulative Percentage (Base Rate)  LM II Recognition 21 - - >75%  VPA II Recognition 27 - - 51-75%   Visual Memory Process Score Summary  Process Score Raw Score Scaled Score Percentile Rank Cumulative Percentage (Base Rate)  VR II Recognition 4 - - 51-75%   ABILITY-MEMORY ANALYSIS  Ability Score:  VCI: 102 Date of Testing:  WAIS-IV; WMS-IV 2023/08/04  Predicted Difference Method   Index Predicted WMS-IV Index Score Actual WMS-IV Index Score Difference Critical Value  Significant Difference Y/N Base Rate  Auditory Memory 101 100 1 9.14 N   Visual Memory 101 82 19 7.30 Y 5-10%  Immediate Memory 101 100 1 10.12 N   Delayed Memory 101 85 16 10.62 Y 10-15%  Statistical significance (critical value) at the .01 level.    Feedback to Patient: Theresa Mejia will return on 12/13/2023 for an interactive feedback session with Dr. Corina at which time her test performances, clinical impressions and treatment recommendations will be reviewed in detail. The patient understands she can contact our office should she require our assistance before this time.  130 minutes spent face-to-face with patient administering standardized tests, 30 minutes spent scoring Radiographer, therapeutic). [CPT [id number], [id number]]  Full report to follow.

## 2023-08-07 NOTE — Progress Notes (Unsigned)
 No chief complaint on file.   HISTORY OF PRESENT ILLNESS:  08/07/23 ALL:  Theresa Mejia is a 83 y.o. female here today for follow up for memory loss. She was last seen by Dr Chalice 10/2022. MRI advised ?? ATN profile negative. She was seen in consult with Dr Corina 07/27/2023. Neurocognitive testing scheduled for 10/31/2023. She called 07/31/2023 requesting visit to discuss resuming CPAP therapy as she was not sleeping well. HST showed moderate OSA with AHI 27.5/h.   She reports    HISTORY (copied from Dr Dohmeier's previous note)  Theresa Mejia is a 83 y.o. female patient who is here for revisit 10/12/2022 for  neuro-cognitve disorder.  Chief concern according to patient :  memory follow up.  The biomarker testing by neurofilament's and p tau protein and genetic markers for late onset Alzheimer's disease were all negative   Given this constellation I doubt that the patient suffers from a form of Alzheimer's disease. She has corrected her cholesterol and triglyceride levels, and has normal HBa1c,  TSH was not detected., verified twice. CT brain normal for age, white matter , global atrophy.    Memory will be retested : >moca today.  Family disposition: Her Dad had 8 other siblings and out of the 9 siblings 7 had dementia , most likely Alzheimers.     06-22-2022: Theresa Mejia is a 83 y.o. female and seen here upon referral from Dr. Watt for a new problem Consultation/ Evaluation of MEMORY LOSS. This patient reports onset of memory problems  over a period of 24 months . She was last seen in 2020 for a sleep consultation, and had Insomnia and OSA, tried CAP for a year and felt no better ,  just making life more complicated. She moved to [place] and has been there with her husband for about one year- she has since lost her way to synagogue or back home, and her new residence is only blocks away form her old place on [address] / [address], and should be  familiar.  She also stated she cannot recall seeing me in that sleep consultation visit in 2020.  She had a shoulder replacement and cannot recall the name of her surgeon, and she has no trouble with recall of appointments.    03-07-2018  Theresa Mejia is a 83 y.o. female patient , seen here as in a referral  from Dr. Watt for a new evaluation of possible sleep apnea.    Mrs. Hainline has not felt rested and refreshed for 2 decades at least.  She has many times mentioning this to primary care and specialty care physicians.  She has also heard that there are many new treatments out should she be diagnosed with obstructive sleep apnea. Mrs. Poehlman reports that over 20 years ago she had a sleep study under the guidance of Dr. Reggy Salt who found borderline apnea and gave her the option to try CPAP.  She felt that CPAP use rather cumbersome it did not hurt her but it did not change her fragmented sleep or her non-restful sleep pattern.  She discontinued after a while.  In the interval of 20 years she has developed other co-morbidities:  She now has a pacemaker, due to bradycardia and near syncope. She carries a diagnosis of hypothyroidism, was treated by Dr. Christi at the time.    Sleep habits are as follows: dinner time 6 PM- dinner without alcohol, bedtime is 9 PM. She  no longer shares the room with her husband, she sleeps in the Mejia room- she  reads in bed, the room is cool and quiet- but not dark- she has a lot of dormant electronics in the bedroom.sleeps on her side, wakes up prone. Bed is raised by 12 degrees,1 pillow. Nocturia times 1-2. Dreams often but doesn't remember the dreams.  Not longer snoring- wakes up  fine' but fatigues quickly,  Sleep medical history; frequent sinuitis. fatigue, non restful, non refreshed.  Family sleep history: 2 bothers , none of them has apnea. One is 9 years-younger, Rabbi and fully active, no memory concerns. Middle Brother lives in [place] and ultra-orthodox,  no contact.    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  2 adult children, 2 grandchildren. 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 ETOH socially, Caffeine: 50/50 coffee in AM and one cup at lunch, 1 soda a day.    REVIEW OF SYSTEMS: Out of a complete 14 system review of symptoms, the patient complains only of the following symptoms, and all other reviewed systems are negative.   ALLERGIES: Allergies  Allergen Reactions   Ramipril     Unknown reaction   Sulfa Antibiotics     Says it dropped her blood pressure really low.    Sulfonamide Derivatives     REACTION: hypotension     HOME MEDICATIONS: Outpatient Medications Prior to Visit  Medication Sig Dispense Refill   calcium  carbonate (TUMS - DOSED IN MG ELEMENTAL CALCIUM ) 500 MG chewable tablet Chew 1 tablet by mouth 4 (four) times daily as needed for indigestion or heartburn.     Cholecalciferol (VITAMIN D ) 50 MCG (2000 UT) CAPS Take 1 capsule by mouth daily at 12 noon.     clonazePAM  (KLONOPIN ) 0.5 MG tablet TAKE 1/2 AT BEDTIME AS NEEDED FOR INSOMNIA, MAY INCREASE TO A WHOLE TABLET IF NEEDED 30 tablet 0   cyanocobalamin  (VITAMIN B12) 1000 MCG tablet Take 1,000 mcg by mouth 3 (three) times a week.     denosumab  (PROLIA ) 60 MG/ML SOSY injection Inject 60 mg into the skin every 6 (six) months. Dx code: M81.0.  pt has appt on 08/10/23 1 mL 0   diclofenac Sodium (VOLTAREN ARTHRITIS PAIN) 1 % GEL Apply topically 4 (four) times daily.     escitalopram  (LEXAPRO ) 20 MG tablet Take 1 tablet (20 mg total) by mouth daily. 90 tablet 3   fluticasone  (FLONASE ) 50 MCG/ACT nasal spray Place 2 sprays into both nostrils daily. 48 mL 1   guaifenesin  (HUMIBID E) 400 MG TABS tablet Take 400 mg by mouth daily.     ketoconazole  (NIZORAL ) 2 % shampoo Apply 1 Application topically 2 (two) times a week. Use as needed for itchy scalp 120 mL PRN   levocetirizine (XYZAL ) 5 MG tablet Take 1 tablet (5 mg total) by mouth every evening.  Needs appt 30 tablet 0   levothyroxine  (SYNTHROID ) 125 MCG tablet Take 1 tablet (125 mcg total) by mouth daily before breakfast. 90 tablet 1   meclizine (ANTIVERT) 12.5 MG tablet Take 12.5 mg by mouth 3 (three) times daily as needed for dizziness.     metoprolol  succinate (TOPROL -XL) 25 MG 24 hr tablet TAKE 1/2 TABLET BY MOUTH DAILY 45 tablet 2   OVER THE COUNTER MEDICATION Therapain spray     pantoprazole  (PROTONIX ) 40 MG tablet Take 1 tablet (40 mg total) by mouth daily. Take 30 minutes before eating. 90 tablet 1   polyethylene glycol (MIRALAX / GLYCOLAX) 17  g packet Take 17 g by mouth daily as needed for mild constipation.     Polyvinyl Alcohol-Povidone (REFRESH OP) Place 1 drop into both eyes daily as needed (dry eyes).     simvastatin  (ZOCOR ) 20 MG tablet Take 1 tablet (20 mg total) by mouth at bedtime. 90 tablet 1   Wheat Dextrin (BENEFIBER) POWD Take 1-2 Scoops by mouth as needed (constipation). (Take 1 to 2 teaspoons each morning mixed with beverage of choice)     Facility-Administered Medications Prior to Visit  Medication Dose Route Frequency Provider Last Rate Last Admin   denosumab  (PROLIA ) injection 60 mg  60 mg Subcutaneous Once Copland, Harlene BROCKS, MD         PAST MEDICAL HISTORY: Past Medical History:  Diagnosis Date   Allergic rhinitis    Depression    DJD (degenerative joint disease)    GERD (gastroesophageal reflux disease)    Headache(784.0)    sinus   Hypercholesteremia    Hypersomnia    Hypertension    Hypothyroidism    Internal hemorrhoid 11/2008   s/p banding ([facility])   Lumbar disc disease    OBSTRUCTIVE SLEEP APNEA    NPSG 04/29/07- AHI 1.1/hr, RDI 21.2/hr. Weight 176 lbs. CPAP was tried based on the RDI.     OVERACTIVE BLADDER    pt unaware   Presence of permanent cardiac pacemaker 11/11/2015   Reactive depression (situational)    Sinus node dysfunction (HCC)    Spondylosis, cervical      PAST SURGICAL HISTORY: Past Surgical History:  Procedure  Laterality Date   BIOPSY BREAST     BREAST EXCISIONAL BIOPSY Left 2011   CATARACT EXTRACTION W/ INTRAOCULAR LENS IMPLANT Right    COLONOSCOPY     EP IMPLANTABLE DEVICE N/A 11/11/2015   Procedure: Pacemaker Implant;  Surgeon: Danelle LELON Birmingham, MD;  Location: [facility];  Service: Cardiovascular;  Laterality: N/A;   eye lid lift     INSERT / REPLACE / REMOVE PACEMAKER  11/11/2015   KNEE ARTHROSCOPY     Dr. Hiram   MOUTH SURGERY  08/25/2017   implant fell out due to bone loss   REVERSE SHOULDER ARTHROPLASTY Right 08/08/2019   Procedure: REVERSE SHOULDER ARTHROPLASTY;  Surgeon: Dozier Soulier, MD;  Location: [facility];  Service: Orthopedics;  Laterality: Right;   rotator cuff surgery Right    Dr. Leonor   TUBAL LIGATION       FAMILY HISTORY: Family History  Problem Relation Age of Onset   Macular degeneration Mother    Hypothyroidism Mother    Tremor Mother    Stroke Mother 31   Crohn's disease Father 80   Alzheimer's disease Paternal Aunt    Alzheimer's disease Paternal Uncle    Parkinsonism Maternal Grandmother    Diabetes Paternal Grandmother    Colon cancer Neg Hx    Esophageal cancer Neg Hx    Rectal cancer Neg Hx      SOCIAL HISTORY: Social History   Socioeconomic History   Marital status: Married    Spouse name: Not on file   Number of children: 2   Years of education: Not on file   Highest education level: Bachelor's degree (e.g., BA, AB, BS)  Occupational History   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  Tobacco Use   Smoking status: Former    Current packs/day: 0.00    Types: Cigarettes    Start date: 01/10/1958  Quit date: 01/10/1966    Years since quitting: 57.6   Smokeless tobacco: Never  Vaping Use   Vaping status: Never Used  Substance and Sexual Activity   Alcohol use: No   Drug use: No   Sexual activity: Never    Birth control/protection: Post-menopausal  Other Topics Concern   Not  on file  Social History Narrative   married, lives with spouse -   Retired 05/2013   Social Drivers of Health   Financial Resource Strain: Low Risk  (01/25/2023)   Overall Financial Resource Strain (CARDIA)    Difficulty of Paying Living Expenses: Not hard at all  Food Insecurity: No Food Insecurity (01/25/2023)   Hunger Vital Sign    Worried About Running Out of Food in the Last Year: Never true    Ran Out of Food in the Last Year: Never true  Transportation Needs: No Transportation Needs (01/25/2023)   PRAPARE - Administrator, Civil Service (Medical): No    Lack of Transportation (Non-Medical): No  Physical Activity: Insufficiently Active (01/25/2023)   Exercise Vital Sign    Days of Exercise per Week: 2 days    Minutes of Exercise per Session: 10 min  Stress: No Stress Concern Present (01/25/2023)   [employer] of Occupational Health - Occupational Stress Questionnaire    Feeling of Stress : Not at all  Social Connections: Socially Integrated (01/25/2023)   Social Connection and Isolation Panel    Frequency of Communication with Friends and Family: More than three times a week    Frequency of Social Gatherings with Friends and Family: More than three times a week    Attends Religious Services: More than 4 times per year    Active Member of [employer] or Organizations: Yes    Attends Banker Meetings: 1 to 4 times per year    Marital Status: Married  Catering manager Violence: Not At Risk (11/17/2020)   Humiliation, Afraid, Rape, and Kick questionnaire    Fear of Current or Ex-Partner: No    Emotionally Abused: No    Physically Abused: No    Sexually Abused: No     PHYSICAL EXAM  There were no vitals filed for this visit. There is no height or weight on file to calculate BMI.  Generalized: Well developed, in no acute distress  Cardiology: normal rate and rhythm, no murmur auscultated  Respiratory: clear to auscultation bilaterally     Neurological examination  Mentation: Alert oriented to time, place, history taking. Follows all commands speech and language fluent Cranial nerve II-XII: Pupils were equal round reactive to light. Extraocular movements were full, visual field were full on confrontational test. Facial sensation and strength were normal. Uvula tongue midline. Head turning and shoulder shrug  were normal and symmetric. Motor: The motor testing reveals 5 over 5 strength of all 4 extremities. Good symmetric motor tone is noted throughout.  Sensory: Sensory testing is intact to soft touch on all 4 extremities. No evidence of extinction is noted.  Coordination: Cerebellar testing reveals good finger-nose-finger and heel-to-shin bilaterally.  Gait and station: Gait is normal. Tandem gait is normal. Romberg is negative. No drift is seen.  Reflexes: Deep tendon reflexes are symmetric and normal bilaterally.    DIAGNOSTIC DATA (LABS, IMAGING, TESTING) - I reviewed patient records, labs, notes, testing and imaging myself where available.  Lab Results  Component Value Date   WBC 5.7 06/20/2023   HGB 13.3 06/20/2023   HCT 40.0 06/20/2023  MCV 87.5 06/20/2023   PLT 240.0 06/20/2023      Component Value Date/Time   NA 138 06/20/2023 1055   K 4.2 06/20/2023 1055   CL 102 06/20/2023 1055   CO2 31 06/20/2023 1055   GLUCOSE 89 06/20/2023 1055   BUN 20 06/20/2023 1055   CREATININE 0.78 06/20/2023 1055   CREATININE 0.70 11/04/2015 1106   CALCIUM  9.4 06/20/2023 1055   PROT 7.2 10/26/2022 1137   ALBUMIN 4.7 10/26/2022 1137   AST 22 10/26/2022 1137   ALT 16 10/26/2022 1137   ALKPHOS 48 10/26/2022 1137   BILITOT 0.7 10/26/2022 1137   GFRNONAA >60 05/31/2021 1235   GFRAA >60 08/01/2019 1058   Lab Results  Component Value Date   CHOL 132 05/18/2022   HDL 39.40 05/18/2022   LDLCALC 74 05/18/2022   LDLDIRECT 148.3 09/01/2010   TRIG 92.0 05/18/2022   CHOLHDL 3 05/18/2022   Lab Results  Component Value Date    HGBA1C 5.6 06/20/2023   Lab Results  Component Value Date   VITAMINB12 704 10/26/2022   Lab Results  Component Value Date   TSH 1.53 10/21/2022       11/03/2016   10:40 AM  MMSE - Mini Mental State Exam  Orientation to time 5   Orientation to Place 5   Registration 3   Attention/ Calculation 5   Recall 3   Language- name 2 objects 2   Language- repeat 1  Language- follow 3 step command 3   Language- read & follow direction 1   Write a sentence 1   Copy design 1   Total score 30      Data saved with a previous flowsheet row definition        10/12/2022   10:14 AM 06/22/2022   10:23 AM  Montreal Cognitive Assessment   Visuospatial/ Executive (0/5) 4 3  Naming (0/3) 3 3  Attention: Read list of digits (0/2) 2 1  Attention: Read list of letters (0/1) 1 1  Attention: Serial 7 subtraction starting at 100 (0/3) 1 3  Language: Repeat phrase (0/2) 2 1  Language : Fluency (0/1) 1 1  Abstraction (0/2) 2 2  Delayed Recall (0/5) 2 3  Orientation (0/6) 6 6  Total 24 24     ASSESSMENT AND PLAN  83 y.o. year old female  has a past medical history of Allergic rhinitis, Depression, DJD (degenerative joint disease), GERD (gastroesophageal reflux disease), Headache(784.0), Hypercholesteremia, Hypersomnia, Hypertension, Hypothyroidism, Internal hemorrhoid (11/2008), Lumbar disc disease, OBSTRUCTIVE SLEEP APNEA, OVERACTIVE BLADDER, Presence of permanent cardiac pacemaker (11/11/2015), Reactive depression (situational), Sinus node dysfunction (HCC), and Spondylosis, cervical. here with    No diagnosis found.  Leeroy LOISE Land ***.  Healthy lifestyle habits encouraged. *** will follow up with PCP as directed. *** will return to see me in ***, sooner if needed. *** verbalizes understanding and agreement with this plan.   No orders of the defined types were placed in this encounter.    No orders of the defined types were placed in this encounter.    Greig Forbes, MSN, FNP-C  08/07/2023, 4:35 PM  [facility] [address], [address] [place], [place] [address] [phone]

## 2023-08-07 NOTE — Patient Instructions (Incomplete)
 Below is our plan:  We will repeat a home sleep study to evaluate need for sleep apnea management. In the meantine, you can restart CPAP therapy at home to see if this helps.   Please make sure you are staying well hydrated. I recommend 50-60 ounces daily. Well balanced diet and regular exercise encouraged. Consistent sleep schedule with 6-8 hours recommended.   Please continue follow up with care team as directed.   Follow up with *** in ***  You may receive a survey regarding today's visit. I encourage you to leave honest feed back as I do use this information to improve patient care. Thank you for seeing me today!

## 2023-08-09 ENCOUNTER — Ambulatory Visit: Admitting: Family Medicine

## 2023-08-09 ENCOUNTER — Telehealth: Payer: Self-pay | Admitting: Family Medicine

## 2023-08-09 ENCOUNTER — Encounter: Payer: Self-pay | Admitting: Family Medicine

## 2023-08-09 VITALS — BP 153/89 | HR 84 | Ht 60.0 in | Wt 164.5 lb

## 2023-08-09 DIAGNOSIS — Z8669 Personal history of other diseases of the nervous system and sense organs: Secondary | ICD-10-CM

## 2023-08-09 DIAGNOSIS — R413 Other amnesia: Secondary | ICD-10-CM

## 2023-08-09 DIAGNOSIS — G3184 Mild cognitive impairment, so stated: Secondary | ICD-10-CM | POA: Diagnosis not present

## 2023-08-09 NOTE — Telephone Encounter (Signed)
 HST- HTA pending

## 2023-08-10 ENCOUNTER — Ambulatory Visit (INDEPENDENT_AMBULATORY_CARE_PROVIDER_SITE_OTHER): Admitting: *Deleted

## 2023-08-10 DIAGNOSIS — M810 Age-related osteoporosis without current pathological fracture: Secondary | ICD-10-CM | POA: Diagnosis not present

## 2023-08-10 MED ORDER — DENOSUMAB 60 MG/ML ~~LOC~~ SOSY: 60.0000 mg | PREFILLED_SYRINGE | SUBCUTANEOUS | Status: AC

## 2023-08-10 NOTE — Telephone Encounter (Signed)
 HST HTA shara: [id number] (exp. 08/09/23 to 11/07/23)

## 2023-08-10 NOTE — Progress Notes (Signed)
 Patient here for Prolia  injection per physicians orders  Prolia  60 mg SQ , was administered right arm today. Patient tolerated injection.  Patient next injection due: 6 months, appt made:  No- will schedule in 5 months after benefits are ran again  Initial injection: no  Did Prolia  come from pharmacy (if yes please select patient supplied): yes  Cam placed for next injection: yes

## 2023-08-11 ENCOUNTER — Encounter: Payer: Self-pay | Admitting: *Deleted

## 2023-08-11 ENCOUNTER — Other Ambulatory Visit: Payer: Self-pay | Admitting: *Deleted

## 2023-08-11 DIAGNOSIS — M810 Age-related osteoporosis without current pathological fracture: Secondary | ICD-10-CM

## 2023-08-16 ENCOUNTER — Ambulatory Visit (INDEPENDENT_AMBULATORY_CARE_PROVIDER_SITE_OTHER)

## 2023-08-16 DIAGNOSIS — I495 Sick sinus syndrome: Secondary | ICD-10-CM | POA: Diagnosis not present

## 2023-08-16 DIAGNOSIS — M19012 Primary osteoarthritis, left shoulder: Secondary | ICD-10-CM | POA: Diagnosis not present

## 2023-08-17 ENCOUNTER — Ambulatory Visit: Payer: Self-pay | Admitting: Internal Medicine

## 2023-08-17 ENCOUNTER — Ambulatory Visit: Attending: Cardiology | Admitting: *Deleted

## 2023-08-17 ENCOUNTER — Telehealth: Payer: Self-pay

## 2023-08-17 DIAGNOSIS — M6281 Muscle weakness (generalized): Secondary | ICD-10-CM | POA: Diagnosis not present

## 2023-08-17 DIAGNOSIS — I495 Sick sinus syndrome: Secondary | ICD-10-CM

## 2023-08-17 DIAGNOSIS — R278 Other lack of coordination: Secondary | ICD-10-CM | POA: Diagnosis not present

## 2023-08-17 DIAGNOSIS — Z96612 Presence of left artificial shoulder joint: Secondary | ICD-10-CM | POA: Diagnosis not present

## 2023-08-17 LAB — CUP PACEART REMOTE DEVICE CHECK
Date Time Interrogation Session: 20250806093555
Implantable Lead Connection Status: 753985
Implantable Lead Connection Status: 753985
Implantable Lead Implant Date: 20171101
Implantable Lead Implant Date: 20171101
Implantable Lead Location: 753859
Implantable Lead Location: 753860
Implantable Lead Model: 377
Implantable Lead Model: 377
Implantable Lead Serial Number: 49553678
Implantable Lead Serial Number: 49617393
Implantable Pulse Generator Implant Date: 20171101
Pulse Gen Model: 394969
Pulse Gen Serial Number: 68817609

## 2023-08-17 NOTE — Telephone Encounter (Signed)
 Spoke with patient.  She will come today at 245pm for us  to evaluate lead in the device clinic.

## 2023-08-17 NOTE — Progress Notes (Signed)
 Patient seen in clinic today to test RA lead after alert for lead failure received. Lead reprogrammed to bipolar and tested. Sensing, threshold, and impedance values all normal and stable.   RA lead sensing and pacing reprogrammed to bipolar P wave amplitude - 4.56mV RA threshold - 0.6 V @ 0.4 mS RA impedance - 312

## 2023-08-17 NOTE — Telephone Encounter (Signed)
 Alert received from [employer]:  RA lead failure detected (RA lead check) Switch of RA pacing to unipolar because of bipolar failure reported on Jul 05, 2023,  Pt will need in office visit to assess RA lead.

## 2023-09-07 ENCOUNTER — Other Ambulatory Visit: Payer: Self-pay | Admitting: Family Medicine

## 2023-09-07 DIAGNOSIS — J301 Allergic rhinitis due to pollen: Secondary | ICD-10-CM

## 2023-09-07 NOTE — Progress Notes (Signed)
 Remote PPM Transmission

## 2023-09-11 DIAGNOSIS — M6281 Muscle weakness (generalized): Secondary | ICD-10-CM | POA: Diagnosis not present

## 2023-09-11 DIAGNOSIS — Z96612 Presence of left artificial shoulder joint: Secondary | ICD-10-CM | POA: Diagnosis not present

## 2023-09-11 DIAGNOSIS — R278 Other lack of coordination: Secondary | ICD-10-CM | POA: Diagnosis not present

## 2023-09-13 ENCOUNTER — Ambulatory Visit: Payer: PPO | Admitting: Family Medicine

## 2023-09-18 ENCOUNTER — Other Ambulatory Visit: Payer: Self-pay | Admitting: Family Medicine

## 2023-09-18 DIAGNOSIS — L299 Pruritus, unspecified: Secondary | ICD-10-CM

## 2023-10-09 ENCOUNTER — Other Ambulatory Visit: Payer: Self-pay | Admitting: Family Medicine

## 2023-10-09 DIAGNOSIS — J301 Allergic rhinitis due to pollen: Secondary | ICD-10-CM

## 2023-10-09 DIAGNOSIS — M19012 Primary osteoarthritis, left shoulder: Secondary | ICD-10-CM | POA: Diagnosis not present

## 2023-10-10 ENCOUNTER — Ambulatory Visit: Admitting: Neurology

## 2023-10-10 DIAGNOSIS — R413 Other amnesia: Secondary | ICD-10-CM

## 2023-10-10 DIAGNOSIS — Z8669 Personal history of other diseases of the nervous system and sense organs: Secondary | ICD-10-CM

## 2023-10-13 DIAGNOSIS — R278 Other lack of coordination: Secondary | ICD-10-CM | POA: Diagnosis not present

## 2023-10-17 ENCOUNTER — Ambulatory Visit (INDEPENDENT_AMBULATORY_CARE_PROVIDER_SITE_OTHER): Admitting: Neurology

## 2023-10-17 DIAGNOSIS — G4733 Obstructive sleep apnea (adult) (pediatric): Secondary | ICD-10-CM

## 2023-10-17 DIAGNOSIS — R413 Other amnesia: Secondary | ICD-10-CM

## 2023-10-17 DIAGNOSIS — Z8669 Personal history of other diseases of the nervous system and sense organs: Secondary | ICD-10-CM

## 2023-10-18 NOTE — Progress Notes (Signed)
 [facility]  Theresa Mejia 83 year old female Mar 10, 1940 Comm Pref:    HOME SLEEP TEST REPORT ( by Watch PAT)   STUDY DATE:  10-17-2023     ORDERING CLINICIAN:   Greig Forbes, NP  REFERRING CLINICIAN:  Copland, MD    CLINICAL INFORMATION/HISTORY:  last seen by AMY Lomax on 08-09-2023 memory loss, OSA on CPAP,  sinus node dysfunction, amnestic MCI,  osteoporosis. Theresa Mejia is a 83 y.o. female here today for follow up for memory loss. She was last seen by Dr Chalice 10/2022. MRI advised, ATN profile negative. She was seen in consult with Dr Corina 07/27/2023. Neurocognitive testing scheduled for 10/31/2023. She called 07/31/2023 requesting visit to discuss resuming CPAP therapy as she was not sleeping well. HST showed moderate OSA with AHI 27.5/h.   She reports continued fatigue and memory deficits. She feels exhausted despite getting regular sleep each night. She usually goes to bed around 10p and wakes around 9a. She does endorse restlessness. She tosses and turns. Gets up to use the restroom several times a night. She does report talking in sleep and acting out dreams. PCP has her taking clonazepam  0.5mg  as needed. She takes this approx 2 times a month. She does nap 1-2 times a day. May lay down for about 2 hours but reports she naps for about 20 minutes 2-3 times during full nap time.   Memory continues to be a concern. She reports having retaining information. She completed neurocog testing with Dr Corina and is awaiting results. She remains independent around the home. She is able to manage her medications. She does not drive due to left shoulder pain.     Epworth sleepiness score: 9/24.   BMI:32.2 kg/m   Neck Circumference: 14   FINDINGS:   Sleep Summary:   Total Recording Time (hours, min):    10 h 29 m     Total Sleep Time (hours, min):     9 h 50 m            Percent REM (%):    15.8%                                      Respiratory Indices:    Calculated pAHI (per hour):   CMS  AHI was 15/h , no central events                           REM pAHI:      25/h                                            NREM pAHI:    13/h                          Supine AHI:   supine sleep positional AHI was  14.2/h and non supine AHI was 15.1/h . Snoring was loud, with the  mean volume at 49 dB.  Oxygen Saturation Statistics:   Oxygen Saturation (%) Mean:   91%            O2 Saturation Range (%):    from 83% at nadir  through 97%                                    O2 Saturation (minutes) <89%:  45 minutes (!!)          Pulse Rate Statistics:   Pulse Mean (bpm):    72 bpm             Pulse Range:   55 - 146 bpm  ( highly abnormal heart rate variability )    Caveat: the watch pat device does not provide data of cardiac rhythm.              IMPRESSION:  This HST confirms the presence of  moderate -severe obstructive sleep apnea and severe sleep hypoxia, worse in REM sleep. The sleep position did not matter much.     RECOMMENDATION: CPAP therapy to be continued, I would like for this patient to return for an in lab titration . This based on the hypoxia data. In- lab studies can titrate to CPAP/ BiPAP and can add oxygen if needed.     Any CPAP patient should be reminded to be fully compliant with PAP therapy , (defined as using PAP therapy for more than 4 hours each night ) with the goal to improve sleep related symptoms and decrease long term cardiovascular risks. Any PAP therapy patient should be reminded, that it may take up to 3 months to get fully used to using PAP and it may take 1-2 weeks for an established CPAP user to acclimatize to changes in pressure or mask. The earlier full compliance is achieved, the better long term compliance tends to be.   Please note that untreated obstructive sleep apnea may carry additional perioperative morbidity. Patients with significant obstructive sleep apnea  should receive perioperative PAP therapy and the surgical team should be informed of the diagnosis and degree of sleep disordered breathing.  Sleep fragmentation in the presence of normal proportional sleep stages is a nonspecific findings and per se does not signify an intrinsic sleep disorder or a cause for the patient's sleep-related symptoms.  Causes include (but are not limited to) the unfamiliarity of sleeping while recorded by HST device or sleeping in a sleep lab for a full Polysomnography sleep study, but also circadian rhythm disturbances, medication side effects or an underlying mood disorder or medical problem.     INTERPRETING PHYSICIAN:   Dedra Gores, MD

## 2023-10-22 ENCOUNTER — Encounter: Payer: Self-pay | Admitting: Family Medicine

## 2023-10-22 DIAGNOSIS — J301 Allergic rhinitis due to pollen: Secondary | ICD-10-CM

## 2023-10-23 MED ORDER — LEVOCETIRIZINE DIHYDROCHLORIDE 5 MG PO TABS: 5.0000 mg | ORAL_TABLET | Freq: Every evening | ORAL | 3 refills | Status: AC

## 2023-10-31 ENCOUNTER — Encounter: Payer: Self-pay | Admitting: Psychology

## 2023-10-31 ENCOUNTER — Encounter: Attending: Psychology | Admitting: Psychology

## 2023-10-31 DIAGNOSIS — G4700 Insomnia, unspecified: Secondary | ICD-10-CM | POA: Diagnosis not present

## 2023-10-31 DIAGNOSIS — G3184 Mild cognitive impairment, so stated: Secondary | ICD-10-CM | POA: Diagnosis not present

## 2023-10-31 DIAGNOSIS — M5416 Radiculopathy, lumbar region: Secondary | ICD-10-CM | POA: Insufficient documentation

## 2023-10-31 DIAGNOSIS — G4730 Sleep apnea, unspecified: Secondary | ICD-10-CM | POA: Diagnosis not present

## 2023-10-31 DIAGNOSIS — F418 Other specified anxiety disorders: Secondary | ICD-10-CM | POA: Diagnosis not present

## 2023-10-31 NOTE — Progress Notes (Signed)
 Neuropsychological Evaluation   Patient:  Theresa Mejia   DOB: 03/14/1940  MR Number: [id number]  Location: [facility] [address] [place], [address] [place] [address] Dept: [phone]  Start: 8 AM End: 9 AM  Provider/Observer:     Norleen JONELLE Asa PsyD  Chief Complaint:      Chief Complaint  Patient presents with   Memory Loss   Anxiety   Agitation   Sleeping Problem   Pain    Reason For Service:     Referred by Dedra Gores, MD for a neuropsychological evaluation due to concerns about memory difficulties. Reports problems with memory for some time, along with a long-standing history of sleep issues. Originally saw Quita Salt, MD for concerns about sleep apnea, which was diagnosed years ago. Family, including children, has noticed memory issues. There is a family history of Alzheimer's disease on the paternal side (aunt and uncle). A paternal grandfather had diabetes, and a paternal grandmother had a Parkinson's-type condition. Reports no history of epilepsy, tremors, or hallucinations although later noted some tremor in hands when performing actions like holding things and not correlated to increased anxiety state. No significant changes in personality or behavioral style noted, apart from recent anxiety and some increased irritability. Reports increased fatigue and feeling tired. Sleep patterns are disturbed, with difficulty falling asleep. A previous diagnosis of obstructive sleep apnea was made approximately 20 years ago by Dr. Salt, a pulmonologist, who recommended a CPAP machine. The CPAP was tried for a short period but not tolerated. A second attempt with a newer CPAP machine for a couple of months was also unsuccessful. The diagnosis was mild to low-end moderate obstructive sleep apnea, with blood oxygen levels falling below 88%. The importance of treating sleep apnea was  reiterated, including its impact on memory, and potential increase in relative risks of stroke, heart attack, and cancer. Prior cognitive screening includes two MoCA tests in 06/2022 and 10/2022, with stable scores of 24/30 on both occasions. Dr. Gores had prescribed Aricept , but it was not taken due to concerns about side effects.  Onset and Duration of Symptoms: The primary difficulties with memory loss have been identified over the past year. A significant event of geographic disorientation occurred about one year ago while trying to drive to a friend's house. Reports that this memory loss has been noticed by both her and her husband, as well as their children.  Progression of Symptoms: The memory issues seem to come and go. The husband reports that forgotten information is generally not critical, such as forgetting appointments or medications. However, he notes she may not recall recent events or conversations, such as seeing new construction two weeks prior. He feels the issues may be progressing, though sometimes attributes it to tiredness.  Additional Tests and Measures from other records:  Neuroimaging Results: A CT scan of the head on 07/24/2022 identified mild generalized cortical atrophy, slightly more pronounced in the medial temporal lobes. Some foci of hypoattenuation in the white matter, including two in the left anterior internal capsule, were noted, consistent with chronic microvascular ischemic change, but not considered overly significant for age.  Laboratory Tests: Genetic studies for the APOE4 variant were negative, indicating no increased genetic risk for Alzheimer's type pathology. P-tau 181 studies showed no clinical elevation, a pattern also not associated with increased risk of Alzheimer's.  Sleep: Reports difficulty falling asleep and always feeling tired. Has a previous diagnosis  of obstructive sleep apnea but is not currently using a CPAP machine.  A new sleep study was  performed by Dr. Chalice on 10/17/2023.  Reviewed sleep study but it has not been interpreted in patient's chart yet but does appear ongoing sleep apnea with obtained pAHI score of 32.1, which falls in the lower end of severe range.  Will wait for Dr. Chalice to interpret study.    Diet Pattern: Has a pretty decent diet and is being monitored by her doctors.   Tests Administered: Controlled Oral Word Association Test (COWAT; FAS & Animals)  Grooved Pegboard Wechsler Adult Intelligence Scale, 4th Edition (WAIS-IV) Wechsler Memory Scale, 4th Edition (WMS-IV); Older Adult Battery   Participation Level:   Active  Participation Quality:  Appropriate      Behavioral Observation:  The patient appeared well-groomed and appropriately dressed. Her manners were polite and appropriate to the situation. Her attitude towards testing was positive and she demonstrated a good effort.   Well Groomed, Alert, and Appropriate.   Test Results:   Initially, an estimation was made at for the patient's premorbid intellectual and cognitive functioning to provide a comparison point for the current obtained neuropsychological test data.  The patient completed a bachelor's degree in education from the [employer]  after graduating from high school.  The patient noted always performing well in language based studies and psychology type courses and had a relative weakness in advanced math classes and physics although she was an excellent student.  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  The patient enjoys active brain challenging activities such as playing mah-jongg.  It is estimated conservatively that the patient should be performing in the high average range and likely has had some cognitive domains in the superior range relative to normative population.  We will use high average performances as a conservative estimates of  premorbid functioning.  Secondly, an estimation of validity of the current neuropsychological test data.  Embedded validity checks all suggest that the patient put forth good effort and behavioral observations also suggest the patient put forth good effort throughout the testing procedures.  This does appear to be a fair and valid estimate of the patient's current status and his clearly and interpretable profile.  COWAT: FAS total= 40 Z= 0.95 Animals total= 19 Z= 1.21  To objectively assess for verbal fluency and word finding abilities the patient was administered the control oral Word Association test.  On the FAS subtest, which assesses lexical fluency the patient performed quite well performing nearly 1 standard deviation above age, education and gender normative comparisons.  The patient performed even better on the animal naming subtest, which assesses semantic fluency, where she performed 1.21 standard deviations above normative prediction levels which is also an excellent score.  The patient has always been quite good in language-based skills and this likely reflects a maintenance of her word finding and fluency capacities.  Grooved Pegboard: R (DH) time= 103s Drops= 0  Percentile Rank= 33rd L (NDH) time= 139s Drops= 0  Percentile Rank= 35th **Pt struggled slightly on L hand trial due to recent shoulder surgery.    To look at potential lateralization of findings around motor function the patient was administered the grooved pegboard test.  This measure assesses fine motor control for both her right dominant hand and left nondominant hand and allows for comparisons between these 2 body regions.  On the  patient's right dominant hand performance the patient completed the task in 103 seconds with no drops which falls at the lower end of the average range.  The patient performed consistently with her left nondominant hand requiring 139 seconds and also had no drops.  The patient was noted to  have some slight difficulties with the left hand due to recent shoulder surgery and even with this the patient performed consistently between left and right hands with no indication of lateralization of findings.  WAIS-IV:               Composite Score Summary          Scale Sum of Scaled Scores Composite Score Percentile Rank 95% Conf. Interval Qualitative Description  Verbal Comprehension 31 VCI 102 55 96-108 Average  Perceptual Reasoning 34 PRI 107 68 100-113 Average  Working Memory 22 WMI 105 63 98-111 Average  Processing Speed 25 PSI 114 82 104-121 High Average  Full Scale 112 FSIQ 108 70 104-112 Average  General Ability 65 GAI 104 61 99-109 Average    In order to obtain an objective assessment of a wide range of cognitive domains with a well normed highly standardized procedure that would allow for repeat testing the patient was administered the complete Wechsler Adult Intelligence Scale-IV.  As the patient is describing some changes in cognition patient's current observed score should not be seen as depicting her lifelong functioning capacity but as a representation of her current status.  2 Global composite scores were calculated with this battery.  The patient produced a full-scale IQ score of 108 which falls at the [facility] and is in the upper end of the average range.  This is only slightly below predicted levels of premorbid functioning and would suggest only minimal change in overall cognitive performance on these measures below predicted levels of premorbid functioning.  The patient produced a general abilities index score which places less emphasis on measures most vulnerable to acute changes including focus execute abilities and auditory encoding.  The patient produced in general abilities index score of 104 which falls at the 61st percentile and is in the average range.  As the patient's best individual performance area had to do with information processing speed  particularly visual scanning and visual searching challenges this was the reason for the lower GAI versus FSIQ.  Looking at individual composite scores the patient did show some weakness relative to predicted levels on verbal comprehension and language based skills although her verbal reasoning and problem-solving of vocabulary knowledge were still at the 63rd percentile.  The patient's auditory encoding, visual-spatial and visual processing were only slightly below predicted levels of premorbid functioning and the patient did well on measures of information processing speed showing no deficits or performances below addicted levels of premorbid functioning.          Verbal Comprehension Subtests Summary        Subtest Raw Score Scaled Score Percentile Rank Reference Group Scaled Score SEM  Similarities 23 11 63 9 0.90  Vocabulary 39 11 63 11 0.73  Information 10 9 37 8 0.73             Perceptual Reasoning Subtests Summary        Subtest Raw Score Scaled Score Percentile Rank Reference Group Scaled Score SEM  Block Design 28 11 63 6 1.34  Matrix Reasoning 10 10 50 5 1.12  Visual Puzzles 12 13 84 8 1.27  Working Comptroller Raw Score Scaled Score Percentile Rank Reference Group Scaled Score SEM  Digit Span 28 13 84 10 0.85  Arithmetic 11 9 37 8 0.99          Processing Speed Subtests Summary        Subtest Raw Score Scaled Score Percentile Rank Reference Group Scaled Score SEM  Symbol Search 26 13 84 7 1.12  Coding 51 12 75 6 1.12   Going through individual subtest of each cognitive domain, the patient performed quite consistently with well-preserved good skills related to information processing speed/focus execute abilities, primary auditory encoding, fluid visual reasoning skills and visual analysis and organization type challenges verbal reasoning skills and vocabulary knowledge.  There were no significant deficits noted on any individual subtest with  the patient's lowest score having to do with her general fund of information which may be related to some retrieval issues that are very mild and a mild relative weakness with regard to her capacity to actively process information in her auditory Register although this performance was still in the average range relative to a normative population.  WMS-IV:            Index Score Summary        Index Sum of Scaled Scores Index Score Percentile Rank 95% Confidence Interval Qualitative Descriptor  Auditory Memory (AMI) 40 100 50 94-106 Average  Visual Memory (VMI) 13 82 12 78-87 Low Average  Immediate Memory (IMI) 30 100 50 94-106 Average  Delayed Memory (DMI) 23 85 16 79-94 Low Average    In order to objectively assess memory and learning in a systematic structured way the patient was administered the Wechsler Memory Scale-IV.  On the Wechsler Adult Intelligence Scale, the patient performed very well on measures of primary auditory encoding and was able to actively process information or auditory Register.  The patient showed a similar performance on measures of visual encoding on the Wechsler Memory Scale (symbol span) and the patient does appear to be able to adequately encode both auditory and visual information and these levels of performances would not put an overtube burden on her capacity to store and organize either auditory or visual information.  Breaking memory functions down between auditory versus visual memory the patient produced an auditory memory index score of 100 which falls at the 50th percentile in the average range relative to a normative population.  This is roughly 1 standard deviation below predicted levels of premorbid functioning with the patient doing better on measures of story learning versus verbal paired Associates subtest in the patient's story learning capacity was generally consistent with predictions of premorbid capacity.  The patient produced a visual memory index  score of 82 which falls at the 12 percentile and is in the low average range.  However, 1 individual subtest accounted for this much lower score (visual reproduction 2) and close inspection of overall performance shows that the patient did quite well under recognition/cued recall of this very same information suggesting that this had to do with retrieval of visual information that had been adequately stored rather than an inability to effectively store visual information.  In fact, on the visual reproduction 1 subtest the patient performed at the 50th percentile which is consistent with overall memory and learning components.  The patient did appear to be able to adequately store and organize new information but had greater difficulty retrieving visual information had been recently experienced/learned.  It will be important not to over interpret this lowered score on visual memory given the fact that she improved so much under recognition/cued recall settings.  This significant weakness for free recall of visual information also significantly dropped down her delayed memory index score and accounted for almost the entire drop.  Therefore, there were no indications of clear difficulty storing and organizing information over a period of delay but more of a retrieval memory issue.         Primary Subtest Scaled Score Summary       Subtest Domain Raw Score Scaled Score Percentile Rank  Logical Memory I AM 32 12 75  Logical Memory II AM 17 11 63  Verbal Paired Associates I AM 14 8 25   Verbal Paired Associates II AM 4 9 37  Visual Reproduction I VM 25 10 50  Visual Reproduction II VM 0 3 1  Symbol Span VWM 11 9 37          Auditory Memory Process Score Summary      Process Score Raw Score Scaled Score Percentile Rank Cumulative Percentage (Base Rate)  LM II Recognition 21 - - >75%  VPA II Recognition 27 - - 51-75%         Visual Memory Process Score Summary      Process Score Raw Score Scaled Score  Percentile Rank Cumulative Percentage (Base Rate)  VR II Recognition 4 - - 51-75%    ABILITY-MEMORY ANALYSIS   Ability Score:    VCI: 102 Date of Testing:           WAIS-IV; WMS-IV 2023/08/04             Predicted Difference Method    Index Predicted WMS-IV Index Score Actual WMS-IV Index Score Difference Critical Value   Significant Difference Y/N Base Rate  Auditory Memory 101 100 1 9.14 N    Visual Memory 101 82 19 7.30 Y 5-10%  Immediate Memory 101 100 1 10.12 N    Delayed Memory 101 85 16 10.62 Y 10-15%    Summary of Results:   The results of the current neuropsychological evaluation show only slight decrease in current functioning versus premorbid estimates of cognitive capacity.  Global cognitive abilities appear to be quite well-maintained and the patient did very well on measures of information processing speed/focus execute abilities, language based skills with the exception of retrieval of long-term types of information, auditory and visual encoding capacity, verbal and visual reasoning and problem-solving capacity, well-maintained visual spatial and visual analysis types of capacities and retention of capacity to learn (store and organize new information adequately).  Overall, memory and learning capacity peers to be generally well-maintained with regard to auditory information and overall visual memory appears to also be well-maintained with 1 individual subtest bringing down her overall score.  As to not over interpret at 1 subtest examination of cued recall data suggest that the patient was able to adequately store and organize visual information but this was more of a free recall weakness on this 1 subtest and not an indication of inability to adequately store and organize visual information.  Impression/Diagnosis:   Overall, the results of the current neuropsychological evaluation are quite encouraging with regard to the potential of underlying progressive degenerative  type condition.  Laboratory testing including genetic testing and blood work were not consistent with findings typical of increased vulnerability for Alzheimer's type pathology although there was some indication on CT of head in 2024 showing some degree of  small vessel ischemic changes which would account for the retrieval difficulties.  The patient also has significant pain symptoms as well and has been previously diagnosed with obstructive sleep apnea with inability to tolerate CPAP device 20 some years ago and it has not been revisited in the meantime until recently.  The patient had a recent sleep study performed but have not been able to see interpretive report but I suspect the patient has significant sleep apnea that is likely playing an increasingly problematic role in her symptoms.  As far as diagnostic considerations the patient would meet the diagnostic consideration for a mild cognitive impairment that is primarily retrieval of memory issues as his component and the patient does not show patterns consistent with any type of neurodegenerative process such as Alzheimer's, Lewy body etc.  While the possibility of developing these types of things cannot be ruled out we do have a very good baseline for future comparisons if there is progressive changes.  As far as treatment recommendations, if the patient's recent sleep study does confirm ongoing sleep apnea I will highly encouraged the patient to aggressively and systematically address this issue as it is likely playing a primary causative role in the types of memory difficulties she is describing.  The patient has some anxiety as well and we may look at how the clonazepam  is being used at night to assist in onset of sleep as it may be playing a deleterious role in both sleep apnea and memory issues.  The patient has been prescribed and continues to take Lexapro  which is a very appropriate strategy to manage her mood and pain issues and frustration  issues that would have little to no negative impact in the patient's ongoing mild cognitive issues.  I will sit down with the patient and go over the results of the current neuropsychological evaluation and specific recommendations for her beyond those listed above.  Diagnosis:    Mild cognitive impairment with memory loss  Insomnia with sleep apnea  Depression with anxiety  Lumbar radiculopathy   _____________________ Norleen Asa, Psy.D. Clinical Neuropsychologist

## 2023-11-01 ENCOUNTER — Ambulatory Visit: Payer: Self-pay | Admitting: Family Medicine

## 2023-11-01 DIAGNOSIS — Z8669 Personal history of other diseases of the nervous system and sense organs: Secondary | ICD-10-CM | POA: Insufficient documentation

## 2023-11-01 DIAGNOSIS — G4733 Obstructive sleep apnea (adult) (pediatric): Secondary | ICD-10-CM | POA: Insufficient documentation

## 2023-11-01 NOTE — Procedures (Signed)
 [facility] at [facility]  Theresa Mejia 83 year old female [birth year] Comm Pref:    HOME SLEEP TEST REPORT ( by Watch PAT)   STUDY DATE:  10-17-2023     ORDERING CLINICIAN:   Greig Forbes, NP  REFERRING CLINICIAN:  Copland, MD    CLINICAL INFORMATION/HISTORY:  last seen by AMY Lomax on 08-09-2023 memory loss, OSA on CPAP,  sinus node dysfunction, amnestic MCI,  osteoporosis. Theresa Mejia is a 83 y.o. female here today for follow up for memory loss. She was last seen by Dr Chalice 10/2022. MRI advised, ATN profile negative. She was seen in consult with Dr Corina 07/27/2023. Neurocognitive testing scheduled for 10/31/2023. She called 07/31/2023 requesting visit to discuss resuming CPAP therapy as she was not sleeping well. HST showed moderate OSA with AHI 27.5/h.   She reports continued fatigue and memory deficits. She feels exhausted despite getting regular sleep each night. She usually goes to bed around 10p and wakes around 9a. She does endorse restlessness. She tosses and turns. Gets up to use the restroom several times a night. She does report talking in sleep and acting out dreams. PCP has her taking clonazepam  0.5mg  as needed. She takes this approx 2 times a month. She does nap 1-2 times a day. May lay down for about 2 hours but reports she naps for about 20 minutes 2-3 times during full nap time.   Memory continues to be a concern. She reports having retaining information. She completed neurocog testing with Dr Corina and is awaiting results. She remains independent around the home. She is able to manage her medications. She does not drive due to left shoulder pain.     Epworth sleepiness score: 9/24.   BMI:32.2 kg/m   Neck Circumference: 14   FINDINGS:   Sleep Summary:   Total Recording Time (hours, min):    10 h 29 m     Total Sleep Time (hours, min):     9 h 50 m            Percent REM (%):    15.8%                                      Respiratory Indices:    Calculated pAHI (per hour):   CMS  AHI was 15/h , no central events                           REM pAHI:      25/h                                            NREM pAHI:    13/h                          Supine AHI:   supine sleep positional AHI was  14.2/h and non supine AHI was 15.1/h . Snoring was loud, with the  mean volume at 49 dB.  Oxygen Saturation Statistics:   Oxygen Saturation (%) Mean:   91%            O2 Saturation Range (%):    from 83% at nadir  through 97%                                    O2 Saturation (minutes) <89%:  45 minutes (!!)          Pulse Rate Statistics:   Pulse Mean (bpm):    72 bpm             Pulse Range:   55 - 146 bpm  ( highly abnormal heart rate variability )    Caveat: the watch pat device does not provide data of cardiac rhythm.              IMPRESSION:  This HST confirms the presence of  moderate -severe obstructive sleep apnea and severe sleep hypoxia, worse in REM sleep. The sleep position did not matter much.     RECOMMENDATION: CPAP therapy to be continued, I would like for this patient to return for an in lab titration . This based on the hypoxia data. In- lab studies can titrate to CPAP/ BiPAP and can add oxygen if needed.     Any CPAP patient should be reminded to be fully compliant with PAP therapy , (defined as using PAP therapy for more than 4 hours each night ) with the goal to improve sleep related symptoms and decrease long term cardiovascular risks. Any PAP therapy patient should be reminded, that it may take up to 3 months to get fully used to using PAP and it may take 1-2 weeks for an established CPAP user to acclimatize to changes in pressure or mask. The earlier full compliance is achieved, the better long term compliance tends to be.   Please note that untreated obstructive sleep apnea may carry additional perioperative morbidity. Patients with significant obstructive sleep apnea  should receive perioperative PAP therapy and the surgical team should be informed of the diagnosis and degree of sleep disordered breathing.  Sleep fragmentation in the presence of normal proportional sleep stages is a nonspecific findings and per se does not signify an intrinsic sleep disorder or a cause for the patient's sleep-related symptoms.  Causes include (but are not limited to) the unfamiliarity of sleeping while recorded by HST device or sleeping in a sleep lab for a full Polysomnography sleep study, but also circadian rhythm disturbances, medication side effects or an underlying mood disorder or medical problem.     INTERPRETING PHYSICIAN:   Dedra Gores, MD

## 2023-11-08 ENCOUNTER — Telehealth: Payer: Self-pay | Admitting: Neurology

## 2023-11-08 NOTE — Telephone Encounter (Signed)
 CPAP HTA pending

## 2023-11-15 ENCOUNTER — Ambulatory Visit (INDEPENDENT_AMBULATORY_CARE_PROVIDER_SITE_OTHER)

## 2023-11-15 ENCOUNTER — Telehealth: Payer: Self-pay | Admitting: Neurology

## 2023-11-15 DIAGNOSIS — I495 Sick sinus syndrome: Secondary | ICD-10-CM | POA: Diagnosis not present

## 2023-11-15 NOTE — Telephone Encounter (Signed)
 Pt called  stating that she was suppose to have an upcoming appt for Tristatin . Pt states she never got call to schedule  and wanted to follow up

## 2023-11-16 ENCOUNTER — Other Ambulatory Visit: Payer: Self-pay | Admitting: Family Medicine

## 2023-11-16 ENCOUNTER — Ambulatory Visit: Payer: Self-pay

## 2023-11-16 DIAGNOSIS — E785 Hyperlipidemia, unspecified: Secondary | ICD-10-CM

## 2023-11-16 DIAGNOSIS — R278 Other lack of coordination: Secondary | ICD-10-CM | POA: Diagnosis not present

## 2023-11-16 DIAGNOSIS — Z96612 Presence of left artificial shoulder joint: Secondary | ICD-10-CM | POA: Diagnosis not present

## 2023-11-16 DIAGNOSIS — M6281 Muscle weakness (generalized): Secondary | ICD-10-CM | POA: Diagnosis not present

## 2023-11-16 LAB — CUP PACEART REMOTE DEVICE CHECK
Date Time Interrogation Session: 20251105095143
Implantable Lead Connection Status: 753985
Implantable Lead Connection Status: 753985
Implantable Lead Implant Date: 20171101
Implantable Lead Implant Date: 20171101
Implantable Lead Location: 753859
Implantable Lead Location: 753860
Implantable Lead Model: 377
Implantable Lead Model: 377
Implantable Lead Serial Number: 49553678
Implantable Lead Serial Number: 49617393
Implantable Pulse Generator Implant Date: 20171101
Pulse Gen Model: 394969
Pulse Gen Serial Number: 68817609

## 2023-11-16 NOTE — Telephone Encounter (Signed)
 FYI Only or Action Required?: Action required by provider: request for appointment.  Patient was last seen in primary care on 01/25/2023 by Theresa Mejia, Theresa BROCKS, MD.  Called Nurse Triage reporting Palpitations.  Symptoms began several weeks ago.  Interventions attempted: Nothing.  Symptoms are: unchanged.Having palpitations. Feels this stress induced,husband diagnosed with cancer.  Triage Disposition: See PCP Within 2 Weeks  Patient/caregiver understands and will follow disposition?: Yes     Copied from CRM [id number]. Topic: Clinical - Red Word Triage >> Nov 16, 2023  9:15 AM Jasmin G wrote: [facility] that prompted transfer to Nurse Triage: Pt initially called to schedule an appt with her PCP, [name] as she states that she hasn't seen her in a while, pt states that she has been experiencing strong heart palpitations, high stress and sleep apnea. Pt states that she was diagnosed with hypoxia recently. Answer Assessment - Initial Assessment Questions 1. DESCRIPTION: Please describe your heart rate or heartbeat that you are having (e.g., fast/slow, regular/irregular, skipped or extra beats, palpitations)     palpitations 2. ONSET: When did it start? (e.g., minutes, hours, days)      minutes 3. DURATION: How long does it last (e.g., seconds, minutes, hours)     minutes 4. PATTERN Does it come and go, or has it been constant since it started?  Does it get worse with exertion?   Are you feeling it now?     Comes and goes 5. TAP: Using your hand, can you tap out what you are feeling on a chair or table in front of you, so that I can hear? Note: Not all patients can do this.       no 6. HEART RATE: Can you tell me your heart rate? How many beats in 15 seconds?  Note: Not all patients can do this.       no 7. RECURRENT SYMPTOM: Have you ever had this before? If Yes, ask: When was the last time? and What happened that time?      yes 8. CAUSE: What do you think is  causing the palpitations?     stress 9. CARDIAC HISTORY: Do you have any history of heart disease? (e.g., heart attack, angina, bypass surgery, angioplasty, arrhythmia)      no 10. OTHER SYMPTOMS: Do you have any other symptoms? (e.g., dizziness, chest pain, sweating, difficulty breathing)       no 11. PREGNANCY: Is there any chance you are pregnant? When was your last menstrual period?       no  Protocols used: Heart Rate and Heartbeat Questions-A-AH  Reason for Disposition  Problems with anxiety or stress  Answer Assessment - Initial Assessment Questions 1. DESCRIPTION: Please describe your heart rate or heartbeat that you are having (e.g., fast/slow, regular/irregular, skipped or extra beats, palpitations)     palpitations 2. ONSET: When did it start? (e.g., minutes, hours, days)      minutes 3. DURATION: How long does it last (e.g., seconds, minutes, hours)     minutes 4. PATTERN Does it come and go, or has it been constant since it started?  Does it get worse with exertion?   Are you feeling it now?     Comes and goes 5. TAP: Using your hand, can you tap out what you are feeling on a chair or table in front of you, so that I can hear? Note: Not all patients can do this.       no 6. HEART RATE: Can  you tell me your heart rate? How many beats in 15 seconds?  Note: Not all patients can do this.       no 7. RECURRENT SYMPTOM: Have you ever had this before? If Yes, ask: When was the last time? and What happened that time?      yes 8. CAUSE: What do you think is causing the palpitations?     stress 9. CARDIAC HISTORY: Do you have any history of heart disease? (e.g., heart attack, angina, bypass surgery, angioplasty, arrhythmia)      no 10. OTHER SYMPTOMS: Do you have any other symptoms? (e.g., dizziness, chest pain, sweating, difficulty breathing)       no 11. PREGNANCY: Is there any chance you are pregnant? When was your last menstrual  period?       no  Protocols used: Heart Rate and Heartbeat Questions-A-AH

## 2023-11-16 NOTE — Progress Notes (Signed)
 Remote PPM Transmission

## 2023-11-17 ENCOUNTER — Ambulatory Visit: Payer: Self-pay | Admitting: Internal Medicine

## 2023-11-19 NOTE — Progress Notes (Deleted)
 [facility] at [facility] [address], [address] [place], [place] [address] [phone]  Date:  11/22/2023   Name:  Theresa Mejia   DOB:  11-06-1940   MRN:  [id number]  PCP:  Watt Harlene BROCKS, MD    Chief Complaint: No chief complaint on file.   History of Present Illness:  Theresa Mejia is a 83 y.o. very pleasant female patient who presents with the following:  Patient seen today with concern of palpitations.  I saw her most recently in January Married to [place], they moved to [employer] living in the last year History of hypothyroidism, essential hypertension, sleep disorder, sinus node dysfunction with pacemaker Neurology is seeing her for mild cognitive impairment with memory loss, most recent visit October 2-she is not thought to have Alzheimer's disease due to negative biomarkers  She was seen by neuropsychology last month She has a long history of palpitations, pacemaker in place Recent pacemaker interrogation was normal  Her good friend Jackquline also reached out to me recently, she was concerned that Zakiah is overwhelmed and not getting enough sleep.   Discussed the use of AI scribe software for clinical note transcription with the patient, who gave verbal consent to proceed.  History of Present Illness     Patient Active Problem List   Diagnosis Date Noted   Moderate obstructive sleep apnea-hypopnea syndrome 11/01/2023   History of obstructive sleep apnea 11/01/2023   Amnestic MCI (mild cognitive impairment with memory loss) 10/12/2022   Sensorineural hearing loss (SNHL) of both ears 08/25/2022   Major neurocognitive disorder due to unknown etiology (HCC) 06/22/2022   Memory loss or impairment 06/22/2022   Family history of Alzheimer's disease 06/22/2022   Lumbar radiculopathy 12/06/2020   Pain of left hip joint 11/11/2020   Pacemaker 08/25/2020   Osteoarthritis of carpometacarpal (CMC) joint of thumb 01/16/2019   Pain  in right hand 01/16/2019   Trigger finger of right hand 01/16/2019   Osteopenia 01/09/2019   Pansinusitis 03/06/2018   Diarrhea in adult patient 12/08/2016   GERD (gastroesophageal reflux disease) 12/08/2016   History of adenomatous polyp of colon 12/08/2016   Hypothyroid 12/08/2016   Orthostatic hypotension 12/06/2016   Sinus node dysfunction (HCC) 11/11/2015   Routine general medical examination at a health care facility 06/04/2015   Insomnia with sleep apnea 06/04/2015   Bronchitis, chronic obstructive w acute bronchitis (HCC) 12/10/2014   Bilateral dry eyes 02/19/2014   Blurred vision, right eye 02/19/2014   Combined form of age-related cataract, left eye 02/19/2014   Dermatochalasis of both eyelids 02/19/2014   Conjunctivochalasis of both eyes 02/19/2014   Hyperopia of both eyes with astigmatism and presbyopia 02/19/2014   Pinguecula of both eyes 02/19/2014   Pseudophakia, right eye 02/19/2014   Ptosis of both eyelids 02/19/2014   Lumbar disc disease 09/01/2010   Obstructive sleep apnea 05/31/2007   Hyperlipidemia with target LDL less than 130 02/05/2007   Depression with anxiety 02/05/2007   Allergic rhinitis 02/05/2007   Hypothyroidism 02/02/2007   Essential hypertension 02/02/2007    Past Medical History:  Diagnosis Date   Allergic rhinitis    Depression    DJD (degenerative joint disease)    GERD (gastroesophageal reflux disease)    Headache(784.0)    sinus   Hypercholesteremia    Hypersomnia    Hypertension    Hypothyroidism    Internal hemorrhoid 11/2008   s/p banding ([facility])   Lumbar disc disease  OBSTRUCTIVE SLEEP APNEA    NPSG 04/29/07- AHI 1.1/hr, RDI 21.2/hr. Weight 176 lbs. CPAP was tried based on the RDI.     OVERACTIVE BLADDER    pt unaware   Presence of permanent cardiac pacemaker 11/11/2015   Reactive depression (situational)    Sinus node dysfunction (HCC)    Spondylosis, cervical     Past Surgical History:  Procedure Laterality Date    BIOPSY BREAST     BREAST EXCISIONAL BIOPSY Left 2011   CATARACT EXTRACTION W/ INTRAOCULAR LENS IMPLANT Right    COLONOSCOPY     EP IMPLANTABLE DEVICE N/A 11/11/2015   Procedure: Pacemaker Implant;  Surgeon: Danelle LELON Birmingham, MD;  Location: [facility] INVASIVE [facility];  Service: Cardiovascular;  Laterality: N/A;   eye lid lift     INSERT / REPLACE / REMOVE PACEMAKER  11/11/2015   KNEE ARTHROSCOPY     Dr. Hiram   MOUTH SURGERY  08/25/2017   implant fell out due to bone loss   REVERSE SHOULDER ARTHROPLASTY Right 08/08/2019   Procedure: REVERSE SHOULDER ARTHROPLASTY;  Surgeon: Dozier Soulier, MD;  Location: [facility];  Service: Orthopedics;  Laterality: Right;   rotator cuff surgery Right    Dr. Leonor   TOTAL SHOULDER REPLACEMENT Left 2025   TUBAL LIGATION      Social History   Tobacco Use   Smoking status: Former    Current packs/day: 0.00    Types: Cigarettes    Start date: 01/10/1958    Quit date: 01/10/1966    Years since quitting: 57.8   Smokeless tobacco: Never  Vaping Use   Vaping status: Never Used  Substance Use Topics   Alcohol use: No   Drug use: No    Family History  Problem Relation Age of Onset   Macular degeneration Mother    Hypothyroidism Mother    Tremor Mother    Stroke Mother 15   Crohn's disease Father 20   Alzheimer's disease Paternal Aunt    Alzheimer's disease Paternal Uncle    Parkinsonism Maternal Grandmother    Diabetes Paternal Grandmother    Colon cancer Neg Hx    Esophageal cancer Neg Hx    Rectal cancer Neg Hx     Allergies  Allergen Reactions   Ramipril     Unknown reaction   Sulfa Antibiotics     Says it dropped her blood pressure really low.    Sulfonamide Derivatives     REACTION: hypotension    Medication list has been reviewed and updated.  Current Outpatient Medications on File Prior to Visit  Medication Sig Dispense Refill   ketoconazole  (NIZORAL ) 2 % shampoo APPLY 1 APPLICATION TOPICALLY 2 (TWO) TIMES A WEEK. USE AS NEEDED FOR  ITCHY SCALP 120 mL PRN   calcium  carbonate (TUMS - DOSED IN MG ELEMENTAL CALCIUM ) 500 MG chewable tablet Chew 1 tablet by mouth 4 (four) times daily as needed for indigestion or heartburn.     Cholecalciferol (VITAMIN D ) 50 MCG (2000 UT) CAPS Take 1 capsule by mouth daily at 12 noon.     clonazePAM  (KLONOPIN ) 0.5 MG tablet TAKE 1/2 AT BEDTIME AS NEEDED FOR INSOMNIA, MAY INCREASE TO A WHOLE TABLET IF NEEDED 30 tablet 0   cyanocobalamin  (VITAMIN B12) 1000 MCG tablet Take 1,000 mcg by mouth 3 (three) times a week.     denosumab  (PROLIA ) 60 MG/ML SOSY injection Inject 60 mg into the skin every 6 (six) months. Dx code: M81.0.  pt has appt on 08/10/23 1 mL  0   diclofenac Sodium (VOLTAREN ARTHRITIS PAIN) 1 % GEL Apply topically 4 (four) times daily.     escitalopram  (LEXAPRO ) 20 MG tablet Take 1 tablet (20 mg total) by mouth daily. 90 tablet 3   fluticasone  (FLONASE ) 50 MCG/ACT nasal spray Place 2 sprays into both nostrils daily. 48 mL 1   guaifenesin  (HUMIBID E) 400 MG TABS tablet Take 400 mg by mouth daily. (Patient not taking: Reported on 08/09/2023)     levocetirizine (XYZAL ) 5 MG tablet Take 1 tablet (5 mg total) by mouth every evening. Needs appt 90 tablet 3   levothyroxine  (SYNTHROID ) 125 MCG tablet Take 1 tablet (125 mcg total) by mouth daily before breakfast. 90 tablet 1   meclizine (ANTIVERT) 12.5 MG tablet Take 12.5 mg by mouth 3 (three) times daily as needed for dizziness.     metoprolol  succinate (TOPROL -XL) 25 MG 24 hr tablet TAKE 1/2 TABLET BY MOUTH DAILY 45 tablet 2   OVER THE COUNTER MEDICATION Therapain spray     pantoprazole  (PROTONIX ) 40 MG tablet Take 1 tablet (40 mg total) by mouth daily. Take 30 minutes before eating. 90 tablet 1   polyethylene glycol (MIRALAX / GLYCOLAX) 17 g packet Take 17 g by mouth daily as needed for mild constipation.     Polyvinyl Alcohol-Povidone (REFRESH OP) Place 1 drop into both eyes daily as needed (dry eyes).     simvastatin  (ZOCOR ) 20 MG tablet TAKE 1  TABLET BY MOUTH EVERYDAY AT BEDTIME 90 tablet 1   tiZANidine  (ZANAFLEX ) 2 MG tablet Take 2 mg by mouth every 8 (eight) hours as needed.     traMADol  (ULTRAM ) 50 MG tablet Take 25-50 mg by mouth every 8 (eight) hours as needed. (Patient not taking: Reported on 08/09/2023)     Wheat Dextrin (BENEFIBER) POWD Take 1-2 Scoops by mouth as needed (constipation). (Take 1 to 2 teaspoons each morning mixed with beverage of choice)     Current Facility-Administered Medications on File Prior to Visit  Medication Dose Route Frequency Provider Last Rate Last Admin   [START ON 02/06/2024] denosumab  (PROLIA ) injection 60 mg  60 mg Subcutaneous Q6 months Maame Dack, Harlene BROCKS, MD        Review of Systems:  As per HPI- otherwise negative.   Physical Examination: There were no vitals filed for this visit. There were no vitals filed for this visit. There is no height or weight on file to calculate BMI. Ideal Body Weight:    GEN: no acute distress. HEENT: Atraumatic, Normocephalic.  Ears and Nose: No external deformity. CV: RRR, No M/G/R. No JVD. No thrill. No extra heart sounds. PULM: CTA B, no wheezes, crackles, rhonchi. No retractions. No resp. distress. No accessory muscle use. ABD: S, NT, ND, +BS. No rebound. No HSM. EXTR: No c/c/e PSYCH: Normally interactive. Conversant.    Assessment and Plan: No diagnosis found.  Assessment & Plan   Signed Harlene Schroeder, MD

## 2023-11-22 ENCOUNTER — Ambulatory Visit: Admitting: Family Medicine

## 2023-11-22 NOTE — Progress Notes (Signed)
 [facility] [address] [place], [place] [address] [phone]  Date:  11/23/2023   Name:  Theresa Mejia   DOB:  [birth year]   MRN:  [id number]  PCP:  Theresa Harlene BROCKS, MD    Chief Complaint: Palpitations (Onset 4-5 weeks ago my husband was diagnosed with cancer and I have been a mess. Prior to that I was having trouble sleeping, I would like a sleep study done. /Thyration sleep study is there an alternative to a CPAP machine  /Fuzzy head all the time, I can't concentrate. My emotions are all over the place. I feel like I'm always on the edge and want to cry /K2D3- PCP opinion  )   History of Present Illness:  Theresa Mejia is a 83 y.o. very pleasant female patient who presents with the following:  Patient seen today with concern of palpitations.  I saw her most recently in January Married to Enterprise, they moved to [employer] living in the last year.  Octaviano has cancer - MM. It looks like they have consulted hospice History of hypothyroidism, essential hypertension, sleep disorder, sinus node dysfunction with pacemaker Neurology is seeing her for mild cognitive impairment with memory loss, most recent visit October 21-she is not thought to have Alzheimer's disease due to negative biomarkers  She was seen by neuropsychology last month She has a long history of palpitations, pacemaker in place Recent pacemaker interrogation was normal  Her good friend Jackquline also reached out to me recently, she was concerned that [name] is overwhelmed with caregiving and not getting enough sleep.   Discussed the use of AI scribe software for clinical note transcription with the patient, who gave verbal consent to proceed.  History of Present Illness Theresa Mejia is an 83 year old female with mild cognitive impairment who presents with sleep disturbances and palpitations.  She has been experiencing sleep disturbances for at least a  month. A home sleep test indicated hypoxia, and she is awaiting a follow-up study with CPAP titration, which has been delayed due to insurance issues. She is concerned about sleeping away from home due to her husband's recent multiple myeloma diagnosis and is interested in exploring alternatives to CPAP.  She experiences palpitations, which she attributes to nerves, along with lightheadedness and unsteadiness on her feet.  These palpitations are not new, she has had them for years.  No falls have occurred, but she feels emotionally on edge and wonders if her symptoms are related to lack of sleep. She is currently taking Lexapro  20 mg, half a pill twice a day, and has used clonazepam  in the past for sleep, which made her feel 'drugged.'  She has been experiencing increased heartburn, for which she uses Tums, and she reports taking naps when at home. She also mentions feeling emotionally overwhelmed and on the verge of crying, though she has not yet done so.  Her social history includes strong family support, with her daughter and other family members actively involved in her and her husband's care.    Patient Active Problem List   Diagnosis Date Noted   Moderate obstructive sleep apnea-hypopnea syndrome 11/01/2023   History of obstructive sleep apnea 11/01/2023   Amnestic MCI (mild cognitive impairment with memory loss) 10/12/2022   Sensorineural hearing loss (SNHL) of both ears 08/25/2022   Major neurocognitive disorder due to unknown etiology (HCC) 06/22/2022   Memory loss or impairment 06/22/2022  Family history of Alzheimer's disease 06/22/2022   Lumbar radiculopathy 12/06/2020   Pain of left hip joint 11/11/2020   Pacemaker 08/25/2020   Osteoarthritis of carpometacarpal (CMC) joint of thumb 01/16/2019   Pain in right hand 01/16/2019   Trigger finger of right hand 01/16/2019   Osteopenia 01/09/2019   Pansinusitis 03/06/2018   Diarrhea in adult patient 12/08/2016   GERD  (gastroesophageal reflux disease) 12/08/2016   History of adenomatous polyp of colon 12/08/2016   Hypothyroid 12/08/2016   Orthostatic hypotension 12/06/2016   Sinus node dysfunction (HCC) 11/11/2015   Routine general medical examination at a health care facility 06/04/2015   Insomnia with sleep apnea 06/04/2015   Bronchitis, chronic obstructive w acute bronchitis (HCC) 12/10/2014   Bilateral dry eyes 02/19/2014   Blurred vision, right eye 02/19/2014   Combined form of age-related cataract, left eye 02/19/2014   Dermatochalasis of both eyelids 02/19/2014   Conjunctivochalasis of both eyes 02/19/2014   Hyperopia of both eyes with astigmatism and presbyopia 02/19/2014   Pinguecula of both eyes 02/19/2014   Pseudophakia, right eye 02/19/2014   Ptosis of both eyelids 02/19/2014   Lumbar disc disease 09/01/2010   Obstructive sleep apnea 05/31/2007   Hyperlipidemia with target LDL less than 130 02/05/2007   Depression with anxiety 02/05/2007   Allergic rhinitis 02/05/2007   Hypothyroidism 02/02/2007   Essential hypertension 02/02/2007    Past Medical History:  Diagnosis Date   Allergic rhinitis    Depression    DJD (degenerative joint disease)    GERD (gastroesophageal reflux disease)    Headache(784.0)    sinus   Hypercholesteremia    Hypersomnia    Hypertension    Hypothyroidism    Internal hemorrhoid 11/2008   s/p banding ([facility])   Lumbar disc disease    OBSTRUCTIVE SLEEP APNEA    NPSG 04/29/07- AHI 1.1/hr, RDI 21.2/hr. Weight 176 lbs. CPAP was tried based on the RDI.     OVERACTIVE BLADDER    pt unaware   Presence of permanent cardiac pacemaker 11/11/2015   Reactive depression (situational)    Sinus node dysfunction (HCC)    Spondylosis, cervical     Past Surgical History:  Procedure Laterality Date   BIOPSY BREAST     BREAST EXCISIONAL BIOPSY Left 2011   CATARACT EXTRACTION W/ INTRAOCULAR LENS IMPLANT Right    COLONOSCOPY     EP IMPLANTABLE DEVICE N/A  11/11/2015   Procedure: Pacemaker Implant;  Surgeon: Danelle LELON Birmingham, MD;  Location: [facility];  Service: Cardiovascular;  Laterality: N/A;   eye lid lift     INSERT / REPLACE / REMOVE PACEMAKER  11/11/2015   KNEE ARTHROSCOPY     Dr. Hiram   MOUTH SURGERY  08/25/2017   implant fell out due to bone loss   REVERSE SHOULDER ARTHROPLASTY Right 08/08/2019   Procedure: REVERSE SHOULDER ARTHROPLASTY;  Surgeon: Dozier Soulier, MD;  Location: [facility];  Service: Orthopedics;  Laterality: Right;   rotator cuff surgery Right    Dr. Leonor   TOTAL SHOULDER REPLACEMENT Left 2025   TUBAL LIGATION      Social History   Tobacco Use   Smoking status: Former    Current packs/day: 0.00    Types: Cigarettes    Start date: 01/10/1958    Quit date: 01/10/1966    Years since quitting: 57.9   Smokeless tobacco: Never  Vaping Use   Vaping status: Never Used  Substance Use Topics   Alcohol use: No   Drug  use: No    Family History  Problem Relation Age of Onset   Macular degeneration Mother    Hypothyroidism Mother    Tremor Mother    Stroke Mother 22   Crohn's disease Father 33   Alzheimer's disease Paternal Aunt    Alzheimer's disease Paternal Uncle    Parkinsonism Maternal Grandmother    Diabetes Paternal Grandmother    Colon cancer Neg Hx    Esophageal cancer Neg Hx    Rectal cancer Neg Hx     Allergies  Allergen Reactions   Ramipril     Unknown reaction   Sulfa Antibiotics     Says it dropped her blood pressure really low.    Sulfonamide Derivatives     REACTION: hypotension    Medication list has been reviewed and updated.  Current Outpatient Medications on File Prior to Visit  Medication Sig Dispense Refill   calcium  carbonate (TUMS - DOSED IN MG ELEMENTAL CALCIUM ) 500 MG chewable tablet Chew 1 tablet by mouth 4 (four) times daily as needed for indigestion or heartburn.     Cholecalciferol (VITAMIN D ) 50 MCG (2000 UT) CAPS Take 1 capsule by mouth daily at 12 noon.      clonazePAM  (KLONOPIN ) 0.5 MG tablet TAKE 1/2 AT BEDTIME AS NEEDED FOR INSOMNIA, MAY INCREASE TO A WHOLE TABLET IF NEEDED 30 tablet 0   cyanocobalamin  (VITAMIN B12) 1000 MCG tablet Take 1,000 mcg by mouth 3 (three) times a week.     denosumab  (PROLIA ) 60 MG/ML SOSY injection Inject 60 mg into the skin every 6 (six) months. Dx code: M81.0.  pt has appt on 08/10/23 1 mL 0   diclofenac Sodium (VOLTAREN ARTHRITIS PAIN) 1 % GEL Apply topically 4 (four) times daily.     escitalopram  (LEXAPRO ) 20 MG tablet Take 1 tablet (20 mg total) by mouth daily. 90 tablet 3   fluticasone  (FLONASE ) 50 MCG/ACT nasal spray Place 2 sprays into both nostrils daily. 48 mL 1   guaifenesin  (HUMIBID E) 400 MG TABS tablet Take 400 mg by mouth daily.     ketoconazole  (NIZORAL ) 2 % shampoo APPLY 1 APPLICATION TOPICALLY 2 (TWO) TIMES A WEEK. USE AS NEEDED FOR ITCHY SCALP 120 mL PRN   levocetirizine (XYZAL ) 5 MG tablet Take 1 tablet (5 mg total) by mouth every evening. Needs appt 90 tablet 3   levothyroxine  (SYNTHROID ) 125 MCG tablet Take 1 tablet (125 mcg total) by mouth daily before breakfast. 90 tablet 1   meclizine (ANTIVERT) 12.5 MG tablet Take 12.5 mg by mouth 3 (three) times daily as needed for dizziness.     metoprolol  succinate (TOPROL -XL) 25 MG 24 hr tablet TAKE 1/2 TABLET BY MOUTH DAILY 45 tablet 2   OVER THE COUNTER MEDICATION Therapain spray     pantoprazole  (PROTONIX ) 40 MG tablet Take 1 tablet (40 mg total) by mouth daily. Take 30 minutes before eating. 90 tablet 1   polyethylene glycol (MIRALAX / GLYCOLAX) 17 g packet Take 17 g by mouth daily as needed for mild constipation.     Polyvinyl Alcohol-Povidone (REFRESH OP) Place 1 drop into both eyes daily as needed (dry eyes).     simvastatin  (ZOCOR ) 20 MG tablet TAKE 1 TABLET BY MOUTH EVERYDAY AT BEDTIME 90 tablet 1   tiZANidine  (ZANAFLEX ) 2 MG tablet Take 2 mg by mouth every 8 (eight) hours as needed.     traMADol  (ULTRAM ) 50 MG tablet Take 25-50 mg by mouth every 8  (eight) hours as needed.  Wheat Dextrin (BENEFIBER) POWD Take 1-2 Scoops by mouth as needed (constipation). (Take 1 to 2 teaspoons each morning mixed with beverage of choice)     Current Facility-Administered Medications on File Prior to Visit  Medication Dose Route Frequency Provider Last Rate Last Admin   [START ON 02/06/2024] denosumab  (PROLIA ) injection 60 mg  60 mg Subcutaneous Q6 months Iylah Dworkin, Harlene BROCKS, MD        Review of Systems:  As per HPI- otherwise negative.   Physical Examination: Vitals:   11/23/23 1137  BP: 122/76  Pulse: 76  SpO2: 99%   Vitals:   11/23/23 1137  Weight: 158 lb (71.7 kg)  Height: 5' (1.524 m)   Body mass index is 30.86 kg/m. Ideal Body Weight: Weight in (lb) to have BMI = 25: 127.7  GEN: no acute distress.  Mildly obese, looks well HEENT: Atraumatic, Normocephalic.  Ears and Nose: No external deformity. CV: RRR, No M/G/R. No JVD. No thrill. No extra heart sounds. PULM: CTA B, no wheezes, crackles, rhonchi. No retractions. No resp. distress. No accessory muscle use. ABD: S, NT, ND, +BS. No rebound. No HSM. EXTR: No c/c/e PSYCH: Normally interactive. Conversant.    Assessment and Plan: Caregiver stress  Sleep disorder  Assessment & Plan Sleep-related hypoxia and insomnia Awaiting CPAP titration study-she plans to go ahead with this and that she is thinking about alternative treatments to CPAP such as an Aspire device - Sent message to pulmonology to expedite scheduling of titration study. - Consider discussing alternative treatments like the Aspire device with pulmonologist after CPAP titration.  Palpitations and chronic fatigue Attributed to nerves and lack of sleep. Discussed trazodone addition for sleep improvement.  She would like to try this.  She hesitates to take anything stronger as she wants to be sure she can be alert if her husband needs her during the night.  Informed about rare risk of serotonin syndrome with trazodone  and escitalopram . - Prescribed trazodone, start with half a tablet 30 minutes before bedtime. - Monitor for symptoms of serotonin syndrome, such as rigidity, goosebumps, and shaking. - Continue current dose of Lexapro . I asked her to please email me in a couple of weeks and let me know how she is doing Heartburn Increased frequency of heartburn. Tums effective for relief. - Continue using Tums as needed for heartburn relief. Offered support and encouragement.  She does have good family and social support.  This is of course a difficult time as her husband has advanced multiple myeloma and may not do well Signed Harlene Schroeder, MD

## 2023-11-23 ENCOUNTER — Ambulatory Visit: Admitting: Family Medicine

## 2023-11-23 ENCOUNTER — Encounter: Payer: Self-pay | Admitting: Family Medicine

## 2023-11-23 VITALS — BP 122/76 | HR 76 | Ht 60.0 in | Wt 158.0 lb

## 2023-11-23 DIAGNOSIS — Z636 Dependent relative needing care at home: Secondary | ICD-10-CM

## 2023-11-23 DIAGNOSIS — G479 Sleep disorder, unspecified: Secondary | ICD-10-CM

## 2023-11-23 MED ORDER — TRAZODONE HCL 50 MG PO TABS: 25.0000 mg | ORAL_TABLET | Freq: Every evening | ORAL | 3 refills | Status: AC | PRN

## 2023-11-23 NOTE — Telephone Encounter (Signed)
 CPAP HTA shara: [id number] (exp. 11/08/23 to 02/06/24)   Jerel gazella

## 2023-11-23 NOTE — Patient Instructions (Signed)
 We will try trazodone as needed for sleep- take a 12 or whole tablet about 30 minutes before you would like to be going to sleep.  Let me know how this works for you- if you prefer the clonazepam  we can use that instead

## 2023-11-24 NOTE — Telephone Encounter (Signed)
 Mailed packet of information and sent mychart.

## 2023-11-29 ENCOUNTER — Encounter: Payer: Self-pay | Admitting: Family Medicine

## 2023-12-13 ENCOUNTER — Encounter: Attending: Psychology | Admitting: Psychology

## 2023-12-13 DIAGNOSIS — G4730 Sleep apnea, unspecified: Secondary | ICD-10-CM | POA: Insufficient documentation

## 2023-12-13 DIAGNOSIS — G3184 Mild cognitive impairment, so stated: Secondary | ICD-10-CM | POA: Insufficient documentation

## 2023-12-13 DIAGNOSIS — F418 Other specified anxiety disorders: Secondary | ICD-10-CM | POA: Diagnosis not present

## 2023-12-13 DIAGNOSIS — M5416 Radiculopathy, lumbar region: Secondary | ICD-10-CM | POA: Insufficient documentation

## 2023-12-13 DIAGNOSIS — G4700 Insomnia, unspecified: Secondary | ICD-10-CM | POA: Insufficient documentation

## 2023-12-25 ENCOUNTER — Ambulatory Visit

## 2023-12-25 ENCOUNTER — Encounter: Payer: Self-pay | Admitting: Psychology

## 2023-12-25 DIAGNOSIS — Z8669 Personal history of other diseases of the nervous system and sense organs: Secondary | ICD-10-CM

## 2023-12-25 DIAGNOSIS — G4733 Obstructive sleep apnea (adult) (pediatric): Secondary | ICD-10-CM

## 2023-12-25 DIAGNOSIS — R413 Other amnesia: Secondary | ICD-10-CM

## 2023-12-25 NOTE — Progress Notes (Signed)
 Neuropsychological Evaluation   Patient:  Theresa Mejia   DOB: 1940-11-03  MR Number: [id number]  Location: [facility] [place] [facility] [address] [place], [address] [place] [address] Dept: [phone]  Start: 11 AM End: 12 PM  Provider/Observer:     Norleen JONELLE Asa PsyD  Chief Complaint:      Chief Complaint  Patient presents with   Memory Loss   Agitation   Pain   Sleeping Problem   12/25/2023: Today I provided feedback regarding the results of the recent neuropsychological evaluation.  I have included a copy of the reason for service and the summary of the neuropsychological evaluation below for convenience and the neuropsychological evaluation can be found in its entirety in the patient's EMR dated 10/31/2023.  Today's feedback visit: Current neuropsychological evaluation results are encouraging regarding the potential for an underlying progressive degenerative condition. Laboratory testing, including genetic testing and blood work, not consistent with typical Alzheimer's type pathology. Head CT in 2024 indicated small vessel ischemic changes. These changes likely account for retrieval difficulties experienced. Significant pain symptoms noted. Previously diagnosed with obstructive sleep apnea, with inability to tolerate CPAP 20 yrs prior. Recent sleep study performed, confirming moderate to severe obstructive sleep apnea. Suspect significant sleep apnea increasingly problematic for symptoms. No cognitive changes consistent with Alzheimer's, Lewy body, Parkinson's, or other progressive degenerative type conditions. Objective issues related to retrieval of information, not storage. Frontal lobe searches for memory via white matter. White matter: long neurons connecting brain regions, fed by small blood vessels. Smaller blood vessels more susceptible to normal aging changes. Imaging findings  consistent with normal age-related changes. These age-related changes contribute to memory retrieval difficulties. No patterns consistent with major neurocognitive disorder (formerly dementia). Cognitive changes linked to small vessel changes, normal aging, and untreated obstructive sleep apnea. Current stress from husband's health further exacerbates sleep and cognitive function. Brain gets used to CPAP over time, similar to seatbelt adaptation. Untreated sleep apnea can lead to memory issues, accelerated aging, increased stroke risk, increased cancer risk, depression, anxiety, and impaired cognitive functioning.  Impression/Diagnosis: Mild cognitive impairment with memory loss. Insomnia with sleep apnea. Depression with anxiety. Lumbar radiculopathy.  Recommendations/Plan: Aggressively and systematically address obstructive sleep apnea with CPAP. Try CPAP for extended periods, even during naps. If issues with CPAP mask, try different masks. If CPAP truly not tolerated after diligent attempts, consider Inspire system. Continue Lexapro  for mood, pain, and frustration. Review clonazepam  use for sleep onset as it may negatively impact sleep apnea and memory. Consult primary care physician for vitamin D  level testing. If vitamin D  deficient, supplement as advised by PCP. Eat good food: vegetables, whole grains, nuts, seeds. Avoid preserved meats. Move body daily. Maintain social connections. Repeat cognitive testing in the future if significant changes occur. Raw data available for future comparisons by other neuropsychologists.   Reason For Service:     Referred by Dedra Gores, MD for a neuropsychological evaluation due to concerns about memory difficulties. Reports problems with memory for some time, along with a long-standing history of sleep issues. Originally saw Quita Salt, MD for concerns about sleep apnea, which was diagnosed years ago. Family, including children, has noticed  memory issues. There is a family history of Alzheimer's disease on the paternal side (aunt and uncle). A paternal grandfather had diabetes, and a paternal grandmother had a Parkinson's-type condition. Reports no history of epilepsy, tremors, or hallucinations although later noted some tremor in  hands when performing actions like holding things and not correlated to increased anxiety state. No significant changes in personality or behavioral style noted, apart from recent anxiety and some increased irritability. Reports increased fatigue and feeling tired. Sleep patterns are disturbed, with difficulty falling asleep. A previous diagnosis of obstructive sleep apnea was made approximately 20 years ago by Dr. Neysa, a pulmonologist, who recommended a CPAP machine. The CPAP was tried for a short period but not tolerated. A second attempt with a newer CPAP machine for a couple of months was also unsuccessful. The diagnosis was mild to low-end moderate obstructive sleep apnea, with blood oxygen levels falling below 88%. The importance of treating sleep apnea was reiterated, including its impact on memory, and potential increase in relative risks of stroke, heart attack, and cancer. Prior cognitive screening includes two MoCA tests in 06/2022 and 10/2022, with stable scores of 24/30 on both occasions. Dr. Chalice had prescribed Aricept , but it was not taken due to concerns about side effects.   Impression/Diagnosis:   Overall, the results of the current neuropsychological evaluation are quite encouraging with regard to the potential of underlying progressive degenerative type condition.  Laboratory testing including genetic testing and blood work were not consistent with findings typical of increased vulnerability for Alzheimer's type pathology although there was some indication on CT of head in 2024 showing some degree of small vessel ischemic changes which would account for the retrieval difficulties.  The patient  also has significant pain symptoms as well and has been previously diagnosed with obstructive sleep apnea with inability to tolerate CPAP device 20 some years ago and it has not been revisited in the meantime until recently.  The patient had a recent sleep study performed but have not been able to see interpretive report but I suspect the patient has significant sleep apnea that is likely playing an increasingly problematic role in her symptoms.  As far as diagnostic considerations the patient would meet the diagnostic consideration for a mild cognitive impairment that is primarily retrieval of memory issues as his component and the patient does not show patterns consistent with any type of neurodegenerative process such as Alzheimer's, Lewy body etc.  While the possibility of developing these types of things cannot be ruled out we do have a very good baseline for future comparisons if there is progressive changes.  As far as treatment recommendations, if the patient's recent sleep study does confirm ongoing sleep apnea I will highly encouraged the patient to aggressively and systematically address this issue as it is likely playing a primary causative role in the types of memory difficulties she is describing.  The patient has some anxiety as well and we may look at how the clonazepam  is being used at night to assist in onset of sleep as it may be playing a deleterious role in both sleep apnea and memory issues.  The patient has been prescribed and continues to take Lexapro  which is a very appropriate strategy to manage her mood and pain issues and frustration issues that would have little to no negative impact in the patient's ongoing mild cognitive issues.  I will sit down with the patient and go over the results of the current neuropsychological evaluation and specific recommendations for her beyond those listed above.  Diagnosis:    Mild cognitive impairment with memory loss  Insomnia with sleep  apnea  Depression with anxiety  Lumbar radiculopathy   _____________________ Norleen Asa, Psy.D. Clinical Neuropsychologist

## 2023-12-27 ENCOUNTER — Encounter

## 2023-12-29 NOTE — Procedures (Signed)
 "  [facility] PAP TITRATION INTERPRETATION REPORT   STUDY DATE: 12/25/2023      PATIENT NAME:  Theresa Mejia         DATE OF BIRTH:  Jul 15, 1940  PATIENT ID:  [id number]    TYPE OF STUDY:  CPAP   PHYSICIAN: DEDRA GORES, MD REFERRED BY: Greig Forbes, NP, at [facility]  SCORING TECHNICIAN: Jesusa Haddock, RPSGT   HISTORY: Theresa Mejia is a psychically active 83 year-old Female with a concer of amnestic MCI, non restorative sleep, disrupted sleep. she has OSA used CPAP buyt found not much releif by doing so. her HST from 10-17-2023 showed  AHI 27.5/h and is moderate to severe . she has nocturia, reports REM BD, and takes Klonopin  at night for this.  Desires naps daily .  The Epworth Sleepiness Scale was endorsed at 9 out of 24 (scores above or equal to 10 are suggestive of hypersomnolence). FSS at X/ 63, GDS at X / 15   DESCRIPTION: A sleep technologist was in attendance for the duration of the recording.  Data collection, scoring, video monitoring, and reporting were performed in compliance with the AASM Manual for the Scoring of Sleep and Associated Events; (Hypopnea is scored based on the criteria listed in Section VIII D. 1b in the AASM Manual V2.6 using a 4% oxygen desaturation rule or Hypopnea is scored based on the criteria listed in Section VIII D. 1a in the AASM Manual V2.6 using 3% oxygen desaturation and /or arousal rule).  A physician certified by the American Board of Sleep Medicine reviewed each epoch of the study.  ADDITIONAL INFORMATION:  Height: 60.0 in Weight: 164 lb (BMI 32) Neck Size: 14.0 in    MEDICATIONS: Tums, Vitamin D , Klonopin , Vitamin B12, Prolia , Voltaren, Lexapro , Flonase , Nizoral , Xyzal , Synthroid , Antivert, Toprol -XL, Thera-pain spray, Protonix , Miralax, Refresh OP, Zocor , Zanaflex , Benefiber   SLEEP CONTINUITY AND SLEEP ARCHITECTURE: The patient was fitted for a mask and chose a FFM in small, EVORA model.  CPAP srated at 5 cm water  pressure  and  was sucessfulll in reducing apnea at 7 cm water  . Once the patient changed from non- supine to supine sleep position, AHI went up again and she required 10 cm water  pressure for control of apnea.  No REM was seen.   Lights off was at 22:25: and lights on 05:08: (6.7 hours in bed). Total sleep time was 313.5 minutes with a decreased sleep efficiency at 77.7%. Sleep latency was 38.0 minutes.  REM latency was  Of the total sleep time, the percentage of stage N1 sleep was 10.2%, stage N2 sleep was 89.0%, stage N3 sleep was 0.8%, and REM sleep was 0.0%. There were 0 Stage R periods observed on this study night, 25 awakenings (i.e. transitions to Stage W from any sleep stage), and 75.0 total stage transitions.  Wake after sleep onset (WASO) time accounted for 52 minutes.  AROUSAL: There were 76 arousals in total.  Of these, 9 were identified as respiratory-related arousals (1.7 /h), 0 were PLM-related arousals (0.0 /h), and 67 were non-specific arousals (12.8 /h)  RESPIRATORY MONITORING:  Based on CMS criteria (using a 4% oxygen desaturation rule for scoring hypopneas), there were 0 apneas (0 obstructive; 0 central; 0 mixed), and  only 14 hypopneas.   Apnea index was 0.0. Hypopnea index was 2.7. The AHI ( apnea-hypopnea index) was 2.7/h overall and all in NREM  ( but was 11.8/ h in supine, 0.2/h in  non-supine; Void of  REM). There were 0 respiratory effort-related arousals (RERAs).   BODY POSITION: Duration of total sleep and percent of total sleep in their respective position is as follows: supine 66 minutes (21.1%), non-supine 247.5 minutes (78.9%); right 247 minutes (78.9%), left 00 minutes (0.0%), and prone 00 minutes (0.0%). Total supine REM sleep time was 00 minutes (0.0% of total REM sleep). LIMB MOVEMENTS: There were 208 periodic limb movements of sleep (39.8/h), of which 0 (0.0/h) were associated with an arousal.  OXIMETRY: Total sleep time spent below 89% was 0.6 minutes, or 0.2% of total sleep  time. Respiratory events were associated with oxyhemoglobin desaturation (nadir during sleep 72% from a mean of 94%).  EKG:  The electrocardiogram documented  a regular rate and rhythm.  The average heart rate during sleep was 71 bpm.  The maximum heart rate during sleep was 89 bpm.   IMPRESSION:   Moderate Obstructive sleep apnea with loud snoring  was controlled under a CPAP pressure of 10 cm water , using an EVORA FFM in  XSmall size .  While the HST has indicated REM sleep accentuation of apnea , this could not be addressed here - sleep architecture was void of  REM sleep.  Due to lack of REM sleep, no REM BD assessment was possible either.  RECOMMENDATIONS: Auto CPAP device to be reset  ( if due for a new machine new settings should be the same )- 5-12 cm water  with 2 cm EPR, and an EVORA FFM in X small size ( [employer]) with headgear, heated humidification.    The patient indicated she felt very constrained in the [facility] but her sleep was remarkably un-fragmented , apneas were well controlled .  I recommend that the patient avoids back sleep.   Again, we were not able to capture REM sleep in this study and it is possible that a higher setting will be needed to overcome REM apneas. I chose therfore a slightly higher maximum pressure range .  follow up with NP Lomax in March or April 2026.        Kayron Kalmar,  MD            Pressure IPAP/EPAP 00 05 06 07 09 10   O2 Vol 0.0 0.0 0.0 0.0 0.0 0.0  Time TRT 2.47m 70.67m 34.57m 242.2m 25.61m 29.41m   TST 0.31m 4.67m 25.22m 230.22m 25.52m 29.27m  Sleep Stage % Wake 100.0 93.6 26.5 5.0 2.0 1.7   % REM 0.0 0.0 0.0 0.0 0.0 0.0   % N1 0.0 100.0 68.0 3.3 0.0 10.3   % N2 0.0 0.0 32.0 95.7 100.0 89.7   % N3 0.0 0.0 0.0 1.1 0.0 0.0  Respiratory Total Events 0 0 1 10 7 2    Obs. Apn. 0 0 0 0 0 0   Mixed Apn. 0 0 0 0 0 0   Cen. Apn. 0 0 0 0 0 0   Hypopneas 0 0 1 10 7 2    AHI 0.00 0.00 2.40 2.61 16.80 4.14   Supine AHI 0.00 0.00 0.00 35.00  16.80 4.14   Prone AHI 0.00 0.00 0.00 0.00 0.00 0.00   Side AHI 0.00 0.00 2.40 0.83 0.00 0.00  Respiratory (4%) Hypopneas (4%) 0.00 0.00 0.00 5.00 7.00 2.00   AHI (4%) 0.00 0.00 0.00 1.30 16.80 4.14   Supine AHI (4%) 0.00 0.00 0.00 20.00 16.80 4.14   Prone AHI (4%) 0.00 0.00 0.00 0.00 0.00 0.00   Side AHI (4%) 0.00 0.00 0.00  0.28 0.00 0.00  Desat Profile <= 90% 0.62m 0.45m 0.14m 1.52m 1.18m 0.67m   <= 80% 0.48m 0.35m 0.28m 0.71m 0.80m 0.56m   <= 70% 0.35m 0.87m 0.33m 0.73m 0.76m 0.40m   <= 60% 0.76m 0.25m 0.1m 0.14m 0.74m 0.26m  Arousal Index Apnea 0.0 0.0 0.0 0.0 0.0 0.0   Hypopnea 0.0 0.0 0.0 0.3 14.4 4.1   LM 0.0 0.0 0.0 0.0 0.0 0.0   Spontaneous 0.0 80.0 31.2 7.3 16.8 26.9   "

## 2024-01-01 NOTE — Telephone Encounter (Addendum)
 Spoke to patient gave sleep study results . Pt chose [facility] as DME Pt aware of insurance compliance Gave patient  [facility] # . Sent orders to [facility] this afternoon Made f/u visit with Amy,NP 03/2024

## 2024-01-02 ENCOUNTER — Encounter

## 2024-01-18 ENCOUNTER — Telehealth: Payer: Self-pay

## 2024-01-18 NOTE — Telephone Encounter (Signed)
 Prolia  VOB initiated via [email or web address]  Next Prolia  inj DUE: 02/10/24

## 2024-01-19 ENCOUNTER — Other Ambulatory Visit: Payer: Self-pay | Admitting: Family Medicine

## 2024-01-19 ENCOUNTER — Other Ambulatory Visit: Payer: Self-pay

## 2024-01-19 DIAGNOSIS — M810 Age-related osteoporosis without current pathological fracture: Secondary | ICD-10-CM

## 2024-01-22 ENCOUNTER — Other Ambulatory Visit (HOSPITAL_COMMUNITY): Payer: Self-pay

## 2024-01-22 NOTE — Telephone Encounter (Signed)
 Pt ready for scheduling for PROLIA  on or after : 02/10/24  Option# 1: Buy/Bill (Office supplied medication)  Out-of-pocket cost due at time of clinic visit: $352  Number of injection/visits approved: ---  Primary: [employer] Prolia  co-insurance: 20% Admin fee co-insurance: 0%  Secondary: --- Prolia  co-insurance:  Admin fee co-insurance:   Medical Benefit Details: Date Benefits were checked: 01/18/24 Deductible: NO/ Coinsurance: 20%/ Admin Fee: 0%  Prior Auth: N/A PA# Expiration Date:   # of doses approved: ----------------------------------------------------------------------- Option# 2- Med Obtained from pharmacy: [facility]REF[facility]ED FOR PHARMACY BENEFIT  Pharmacy benefit: Copay $629.70 (Paid to pharmacy) Admin Fee: 0% (Pay at clinic)  Prior Auth: N/A PA# Expiration Date:   # of doses approved:   If patient wants fill through the pharmacy benefit please send prescription to: [facility], and include estimated need by date in rx notes. Pharmacy will ship medication directly to the office.  Patient NOT eligible for Prolia  Copay Card. Copay Card can make patient's cost as little as $25. Link to apply: [email or web address]://[email or web address]  ** This summary of benefits is an estimation of the patient's out-of-pocket cost. Exact cost may very based on individual plan coverage.

## 2024-01-23 ENCOUNTER — Other Ambulatory Visit: Payer: Self-pay

## 2024-01-24 ENCOUNTER — Telehealth: Payer: Self-pay | Admitting: *Deleted

## 2024-01-24 NOTE — Telephone Encounter (Signed)
 You last saw pt in Nov and last labs was done in June.  When did you want to see her back?Theresa Mejia  She will be due for her Prolia  on 02/11/24 and need a new calcium  level.

## 2024-01-25 NOTE — Telephone Encounter (Signed)
 Patient will call back to scheduled she has a dentist appointment to have teeth removed.

## 2024-02-01 ENCOUNTER — Encounter: Payer: Self-pay | Admitting: Family Medicine

## 2024-02-01 ENCOUNTER — Other Ambulatory Visit: Payer: Self-pay

## 2024-02-01 DIAGNOSIS — F5101 Primary insomnia: Secondary | ICD-10-CM

## 2024-02-01 MED ORDER — CLONAZEPAM 0.5 MG PO TABS: ORAL_TABLET | ORAL | 0 refills | Status: AC

## 2024-02-06 ENCOUNTER — Encounter: Payer: Self-pay | Admitting: Family Medicine

## 2024-02-06 ENCOUNTER — Other Ambulatory Visit: Payer: Self-pay

## 2024-02-07 ENCOUNTER — Other Ambulatory Visit: Payer: Self-pay

## 2024-02-08 ENCOUNTER — Other Ambulatory Visit: Payer: Self-pay

## 2024-02-14 ENCOUNTER — Ambulatory Visit

## 2024-02-15 ENCOUNTER — Other Ambulatory Visit: Payer: Self-pay

## 2024-02-15 ENCOUNTER — Telehealth: Payer: Self-pay | Admitting: Student

## 2024-02-15 LAB — CUP PACEART REMOTE DEVICE CHECK
Date Time Interrogation Session: 20260204095149
Implantable Lead Connection Status: 753985
Implantable Lead Connection Status: 753985
Implantable Lead Implant Date: 20171101
Implantable Lead Implant Date: 20171101
Implantable Lead Location: 753859
Implantable Lead Location: 753860
Implantable Lead Model: 377
Implantable Lead Model: 377
Implantable Lead Serial Number: 49553678
Implantable Lead Serial Number: 49617393
Implantable Pulse Generator Implant Date: 20171101
Pulse Gen Model: 394969
Pulse Gen Serial Number: 68817609

## 2024-02-15 NOTE — Progress Notes (Signed)
 Per Joyice (biologics coordinator), patient will be doing buy and bill. Dis-enrolling.

## 2024-02-15 NOTE — Telephone Encounter (Signed)
 LM returning patient's call.  I do not see outreach made by our office regarding needed changes to PPM. She did receive her routine transmission results through my chart - which were normal.  My guess is she has seen something confusing with the report.

## 2024-02-15 NOTE — Telephone Encounter (Signed)
 Patient says yesterday she received an alert regarding changes with her PPM. She would like a call back to discuss this.

## 2024-02-16 NOTE — Telephone Encounter (Signed)
 Called and spoke with patient to follow up regarding an alert she said she received about her PPM  Patient stated that the message she received was in regards to her CPAP machine not her PPM and was sorry about the mix up  All questions and concerns addressed at time of call  Patient appreciative of follow up call

## 2024-03-11 ENCOUNTER — Encounter: Admitting: Family Medicine

## 2024-05-15 ENCOUNTER — Encounter

## 2024-08-14 ENCOUNTER — Encounter

## 2024-11-13 ENCOUNTER — Encounter
